# Patient Record
Sex: Female | Born: 1935 | Race: White | Hispanic: No | Marital: Married | Smoking: Never smoker
Health system: Southern US, Community
[De-identification: ages and names within clinical notes are randomized; demographics above are authoritative.]

## PROBLEM LIST (undated history)

## (undated) DIAGNOSIS — I4891 Unspecified atrial fibrillation: Secondary | ICD-10-CM

## (undated) DIAGNOSIS — D721 Eosinophilia, unspecified: Secondary | ICD-10-CM

## (undated) DIAGNOSIS — B029 Zoster without complications: Secondary | ICD-10-CM

## (undated) DIAGNOSIS — S35299A Unspecified injury of branches of celiac and mesenteric artery, initial encounter: Secondary | ICD-10-CM

## (undated) DIAGNOSIS — I5181 Takotsubo syndrome: Secondary | ICD-10-CM

## (undated) DIAGNOSIS — I495 Sick sinus syndrome: Secondary | ICD-10-CM

## (undated) DIAGNOSIS — K219 Gastro-esophageal reflux disease without esophagitis: Secondary | ICD-10-CM

## (undated) DIAGNOSIS — F329 Major depressive disorder, single episode, unspecified: Secondary | ICD-10-CM

## (undated) DIAGNOSIS — I272 Pulmonary hypertension, unspecified: Secondary | ICD-10-CM

## (undated) DIAGNOSIS — J310 Chronic rhinitis: Secondary | ICD-10-CM

## (undated) DIAGNOSIS — E785 Hyperlipidemia, unspecified: Secondary | ICD-10-CM

## (undated) DIAGNOSIS — M1990 Unspecified osteoarthritis, unspecified site: Secondary | ICD-10-CM

## (undated) DIAGNOSIS — K279 Peptic ulcer, site unspecified, unspecified as acute or chronic, without hemorrhage or perforation: Secondary | ICD-10-CM

## (undated) DIAGNOSIS — F32A Depression, unspecified: Secondary | ICD-10-CM

## (undated) HISTORY — DX: Unspecified injury of branches of celiac and mesenteric artery, initial encounter: S35.299A

## (undated) HISTORY — DX: Unspecified atrial fibrillation: I48.91

## (undated) HISTORY — PX: TONSILLECTOMY: SUR1361

## (undated) HISTORY — PX: COSMETIC SURGERY: SHX468

## (undated) HISTORY — DX: Chronic rhinitis: J31.0

## (undated) HISTORY — DX: Major depressive disorder, single episode, unspecified: F32.9

## (undated) HISTORY — PX: CATARACT EXTRACTION: SUR2

## (undated) HISTORY — DX: Takotsubo syndrome: I51.81

## (undated) HISTORY — DX: Peptic ulcer, site unspecified, unspecified as acute or chronic, without hemorrhage or perforation: K27.9

## (undated) HISTORY — DX: Depression, unspecified: F32.A

## (undated) HISTORY — DX: Unspecified osteoarthritis, unspecified site: M19.90

## (undated) HISTORY — PX: CHOLECYSTECTOMY: SHX55

## (undated) SURGERY — Surgical Case
Anesthesia: *Unknown

---

## 1998-03-25 ENCOUNTER — Other Ambulatory Visit: Admission: RE | Admit: 1998-03-25 | Discharge: 1998-03-25 | Payer: Self-pay | Admitting: *Deleted

## 1999-02-16 ENCOUNTER — Other Ambulatory Visit: Admission: RE | Admit: 1999-02-16 | Discharge: 1999-02-16 | Payer: Self-pay | Admitting: *Deleted

## 1999-08-02 ENCOUNTER — Emergency Department (HOSPITAL_COMMUNITY): Admission: EM | Admit: 1999-08-02 | Discharge: 1999-08-02 | Payer: Self-pay | Admitting: Emergency Medicine

## 1999-08-02 ENCOUNTER — Encounter: Payer: Self-pay | Admitting: Emergency Medicine

## 2001-03-02 ENCOUNTER — Encounter: Admission: RE | Admit: 2001-03-02 | Discharge: 2001-03-02 | Payer: Self-pay | Admitting: *Deleted

## 2001-03-02 ENCOUNTER — Encounter: Payer: Self-pay | Admitting: *Deleted

## 2003-02-06 ENCOUNTER — Inpatient Hospital Stay (HOSPITAL_COMMUNITY): Admission: AD | Admit: 2003-02-06 | Discharge: 2003-02-08 | Payer: Self-pay

## 2003-03-04 ENCOUNTER — Ambulatory Visit (HOSPITAL_COMMUNITY): Admission: RE | Admit: 2003-03-04 | Discharge: 2003-03-04 | Payer: Self-pay

## 2003-05-23 ENCOUNTER — Ambulatory Visit (HOSPITAL_COMMUNITY): Admission: RE | Admit: 2003-05-23 | Discharge: 2003-05-23 | Payer: Self-pay | Admitting: Pulmonary Disease

## 2003-05-23 ENCOUNTER — Encounter: Payer: Self-pay | Admitting: Pulmonary Disease

## 2003-05-27 ENCOUNTER — Encounter (INDEPENDENT_AMBULATORY_CARE_PROVIDER_SITE_OTHER): Payer: Self-pay | Admitting: *Deleted

## 2003-05-27 ENCOUNTER — Ambulatory Visit (HOSPITAL_COMMUNITY): Admission: RE | Admit: 2003-05-27 | Discharge: 2003-05-27 | Payer: Self-pay | Admitting: Gastroenterology

## 2004-02-04 ENCOUNTER — Ambulatory Visit: Admission: RE | Admit: 2004-02-04 | Discharge: 2004-02-04 | Payer: Self-pay | Admitting: Pulmonary Disease

## 2004-03-13 ENCOUNTER — Other Ambulatory Visit: Admission: RE | Admit: 2004-03-13 | Discharge: 2004-03-13 | Payer: Self-pay | Admitting: Family Medicine

## 2004-07-21 ENCOUNTER — Ambulatory Visit (HOSPITAL_COMMUNITY): Admission: RE | Admit: 2004-07-21 | Discharge: 2004-07-21 | Payer: Self-pay | Admitting: Gastroenterology

## 2004-07-21 ENCOUNTER — Encounter (INDEPENDENT_AMBULATORY_CARE_PROVIDER_SITE_OTHER): Payer: Self-pay | Admitting: Specialist

## 2004-09-02 ENCOUNTER — Ambulatory Visit: Payer: Self-pay | Admitting: Internal Medicine

## 2004-09-07 ENCOUNTER — Ambulatory Visit: Payer: Self-pay | Admitting: Pulmonary Disease

## 2004-09-23 ENCOUNTER — Ambulatory Visit: Payer: Self-pay | Admitting: Pulmonary Disease

## 2004-11-03 ENCOUNTER — Ambulatory Visit: Payer: Self-pay | Admitting: Family Medicine

## 2004-11-25 ENCOUNTER — Ambulatory Visit: Payer: Self-pay | Admitting: Pulmonary Disease

## 2005-02-19 ENCOUNTER — Ambulatory Visit: Payer: Self-pay | Admitting: Family Medicine

## 2005-03-08 ENCOUNTER — Other Ambulatory Visit: Admission: RE | Admit: 2005-03-08 | Discharge: 2005-03-08 | Payer: Self-pay | Admitting: Family Medicine

## 2005-03-08 ENCOUNTER — Ambulatory Visit: Payer: Self-pay | Admitting: Family Medicine

## 2005-03-18 ENCOUNTER — Ambulatory Visit: Payer: Self-pay | Admitting: Pulmonary Disease

## 2005-03-30 ENCOUNTER — Ambulatory Visit: Payer: Self-pay | Admitting: Family Medicine

## 2005-04-05 ENCOUNTER — Ambulatory Visit: Payer: Self-pay | Admitting: Family Medicine

## 2005-06-30 ENCOUNTER — Ambulatory Visit (HOSPITAL_COMMUNITY): Admission: RE | Admit: 2005-06-30 | Discharge: 2005-06-30 | Payer: Self-pay | Admitting: Gastroenterology

## 2005-06-30 ENCOUNTER — Encounter (INDEPENDENT_AMBULATORY_CARE_PROVIDER_SITE_OTHER): Payer: Self-pay | Admitting: Specialist

## 2005-07-01 ENCOUNTER — Ambulatory Visit: Payer: Self-pay | Admitting: Family Medicine

## 2005-07-12 ENCOUNTER — Ambulatory Visit: Payer: Self-pay | Admitting: Family Medicine

## 2005-07-19 ENCOUNTER — Ambulatory Visit: Payer: Self-pay | Admitting: Family Medicine

## 2005-07-26 ENCOUNTER — Ambulatory Visit: Payer: Self-pay | Admitting: Family Medicine

## 2005-08-05 ENCOUNTER — Ambulatory Visit: Payer: Self-pay | Admitting: Family Medicine

## 2005-08-10 ENCOUNTER — Ambulatory Visit: Payer: Self-pay | Admitting: Critical Care Medicine

## 2005-08-17 ENCOUNTER — Ambulatory Visit: Payer: Self-pay | Admitting: Internal Medicine

## 2005-08-31 ENCOUNTER — Ambulatory Visit: Payer: Self-pay | Admitting: Pulmonary Disease

## 2005-09-09 ENCOUNTER — Ambulatory Visit: Payer: Self-pay | Admitting: Pulmonary Disease

## 2005-09-10 ENCOUNTER — Ambulatory Visit: Payer: Self-pay | Admitting: Internal Medicine

## 2005-09-22 ENCOUNTER — Ambulatory Visit (HOSPITAL_COMMUNITY): Admission: RE | Admit: 2005-09-22 | Discharge: 2005-09-22 | Payer: Self-pay | Admitting: Internal Medicine

## 2005-10-01 ENCOUNTER — Encounter (INDEPENDENT_AMBULATORY_CARE_PROVIDER_SITE_OTHER): Payer: Self-pay | Admitting: *Deleted

## 2005-10-01 ENCOUNTER — Encounter: Payer: Self-pay | Admitting: Internal Medicine

## 2005-10-01 ENCOUNTER — Ambulatory Visit: Payer: Self-pay | Admitting: Internal Medicine

## 2005-10-01 ENCOUNTER — Ambulatory Visit (HOSPITAL_COMMUNITY): Admission: RE | Admit: 2005-10-01 | Discharge: 2005-10-01 | Payer: Self-pay | Admitting: Internal Medicine

## 2005-10-07 ENCOUNTER — Ambulatory Visit: Payer: Self-pay | Admitting: Family Medicine

## 2005-10-14 ENCOUNTER — Ambulatory Visit: Payer: Self-pay | Admitting: Family Medicine

## 2005-10-21 ENCOUNTER — Ambulatory Visit: Payer: Self-pay | Admitting: Pulmonary Disease

## 2005-11-01 ENCOUNTER — Ambulatory Visit: Payer: Self-pay | Admitting: Family Medicine

## 2005-11-16 ENCOUNTER — Ambulatory Visit: Payer: Self-pay | Admitting: Internal Medicine

## 2005-12-15 LAB — CBC WITH DIFFERENTIAL/PLATELET
Basophils Absolute: 0.1 10*3/uL (ref 0.0–0.1)
Eosinophils Absolute: 5.5 10*3/uL — ABNORMAL HIGH (ref 0.0–0.5)
HCT: 37.8 % (ref 34.8–46.6)
HGB: 12.7 g/dL (ref 11.6–15.9)
MCV: 84.8 fL (ref 81.0–101.0)
MONO%: 6.2 % (ref 0.0–13.0)
NEUT#: 3 10*3/uL (ref 1.5–6.5)
RDW: 14.5 % (ref 11.3–14.5)

## 2005-12-29 LAB — CBC WITH DIFFERENTIAL/PLATELET
Basophils Absolute: 0.2 10*3/uL — ABNORMAL HIGH (ref 0.0–0.1)
EOS%: 44.1 % — ABNORMAL HIGH (ref 0.0–7.0)
MONO%: 5.2 % (ref 0.0–13.0)
NEUT#: 3.2 10*3/uL (ref 1.5–6.5)
NEUT%: 31.4 % — ABNORMAL LOW (ref 39.6–76.8)
Platelets: 335 10*3/uL (ref 145–400)
RBC: 4.45 10*6/uL (ref 3.70–5.32)
WBC: 10.2 10*3/uL — ABNORMAL HIGH (ref 3.9–10.0)

## 2005-12-29 LAB — COMPREHENSIVE METABOLIC PANEL
ALT: 27 U/L (ref 0–40)
Alkaline Phosphatase: 85 U/L (ref 39–117)
Sodium: 125 mEq/L — ABNORMAL LOW (ref 135–145)
Total Bilirubin: 0.3 mg/dL (ref 0.3–1.2)
Total Protein: 6.7 g/dL (ref 6.0–8.3)

## 2006-01-10 ENCOUNTER — Ambulatory Visit: Payer: Self-pay | Admitting: Internal Medicine

## 2006-01-12 LAB — LACTATE DEHYDROGENASE: LDH: 166 U/L (ref 94–250)

## 2006-01-12 LAB — CBC WITH DIFFERENTIAL/PLATELET
BASO%: 0.9 % (ref 0.0–2.0)
EOS%: 42.1 % — ABNORMAL HIGH (ref 0.0–7.0)
HCT: 34.5 % — ABNORMAL LOW (ref 34.8–46.6)
LYMPH%: 15.6 % (ref 14.0–48.0)
MCH: 29.3 pg (ref 26.0–34.0)
MCHC: 34.3 g/dL (ref 32.0–36.0)
MONO#: 0.4 10*3/uL (ref 0.1–0.9)
NEUT%: 36.2 % — ABNORMAL LOW (ref 39.6–76.8)
Platelets: 233 10*3/uL (ref 145–400)

## 2006-01-12 LAB — COMPREHENSIVE METABOLIC PANEL
ALT: 30 U/L (ref 0–40)
BUN: 12 mg/dL (ref 6–23)
CO2: 21 mEq/L (ref 19–32)
Creatinine, Ser: 0.6 mg/dL (ref 0.4–1.2)
Total Bilirubin: 0.4 mg/dL (ref 0.3–1.2)

## 2006-01-25 ENCOUNTER — Ambulatory Visit: Payer: Self-pay | Admitting: Pulmonary Disease

## 2006-01-26 LAB — CBC WITH DIFFERENTIAL/PLATELET
Basophils Absolute: 0.1 10*3/uL (ref 0.0–0.1)
Eosinophils Absolute: 3.5 10*3/uL — ABNORMAL HIGH (ref 0.0–0.5)
HCT: 34.2 % — ABNORMAL LOW (ref 34.8–46.6)
HGB: 11.7 g/dL (ref 11.6–15.9)
MONO#: 0.4 10*3/uL (ref 0.1–0.9)
NEUT%: 26.6 % — ABNORMAL LOW (ref 39.6–76.8)
WBC: 7.4 10*3/uL (ref 3.9–10.0)
lymph#: 1.4 10*3/uL (ref 0.9–3.3)

## 2006-02-16 LAB — CBC WITH DIFFERENTIAL/PLATELET
Basophils Absolute: 0.1 10*3/uL (ref 0.0–0.1)
Eosinophils Absolute: 1.9 10*3/uL — ABNORMAL HIGH (ref 0.0–0.5)
HGB: 11.3 g/dL — ABNORMAL LOW (ref 11.6–15.9)
LYMPH%: 16.5 % (ref 14.0–48.0)
MCV: 87.6 fL (ref 81.0–101.0)
MONO%: 5.3 % (ref 0.0–13.0)
NEUT#: 4.2 10*3/uL (ref 1.5–6.5)
Platelets: 254 10*3/uL (ref 145–400)

## 2006-02-16 LAB — COMPREHENSIVE METABOLIC PANEL
Albumin: 4.3 g/dL (ref 3.5–5.2)
Alkaline Phosphatase: 70 U/L (ref 39–117)
BUN: 12 mg/dL (ref 6–23)
CO2: 21 mEq/L (ref 19–32)
Glucose, Bld: 89 mg/dL (ref 70–99)
Potassium: 4.1 mEq/L (ref 3.5–5.3)
Total Bilirubin: 0.4 mg/dL (ref 0.3–1.2)

## 2006-02-16 LAB — LACTATE DEHYDROGENASE: LDH: 160 U/L (ref 94–250)

## 2006-03-02 ENCOUNTER — Ambulatory Visit: Payer: Self-pay | Admitting: Internal Medicine

## 2006-03-02 LAB — CBC WITH DIFFERENTIAL/PLATELET
BASO%: 0.4 % (ref 0.0–2.0)
EOS%: 37.5 % — ABNORMAL HIGH (ref 0.0–7.0)
Eosinophils Absolute: 2.4 10*3/uL — ABNORMAL HIGH (ref 0.0–0.5)
LYMPH%: 18.9 % (ref 14.0–48.0)
MCH: 30.9 pg (ref 26.0–34.0)
MCHC: 34.5 g/dL (ref 32.0–36.0)
MCV: 89.4 fL (ref 81.0–101.0)
MONO%: 4.4 % (ref 0.0–13.0)
NEUT#: 2.5 10*3/uL (ref 1.5–6.5)
Platelets: 235 10*3/uL (ref 145–400)
RBC: 3.7 10*6/uL (ref 3.70–5.32)
RDW: 20.4 % — ABNORMAL HIGH (ref 11.3–14.5)

## 2006-03-02 LAB — COMPREHENSIVE METABOLIC PANEL
ALT: 23 U/L (ref 0–40)
AST: 25 U/L (ref 0–37)
Albumin: 4.5 g/dL (ref 3.5–5.2)
CO2: 22 mEq/L (ref 19–32)
Calcium: 8.7 mg/dL (ref 8.4–10.5)
Chloride: 94 mEq/L — ABNORMAL LOW (ref 96–112)
Potassium: 4.2 mEq/L (ref 3.5–5.3)
Sodium: 128 mEq/L — ABNORMAL LOW (ref 135–145)
Total Protein: 6.7 g/dL (ref 6.0–8.3)

## 2006-03-02 LAB — LACTATE DEHYDROGENASE: LDH: 179 U/L (ref 94–250)

## 2006-03-16 LAB — COMPREHENSIVE METABOLIC PANEL
ALT: 21 U/L (ref 0–40)
AST: 22 U/L (ref 0–37)
Albumin: 4.2 g/dL (ref 3.5–5.2)
Alkaline Phosphatase: 74 U/L (ref 39–117)
BUN: 13 mg/dL (ref 6–23)
Chloride: 97 mEq/L (ref 96–112)
Creatinine, Ser: 0.57 mg/dL (ref 0.40–1.20)
Potassium: 4.2 mEq/L (ref 3.5–5.3)

## 2006-03-16 LAB — CBC WITH DIFFERENTIAL/PLATELET
BASO%: 0.8 % (ref 0.0–2.0)
Basophils Absolute: 0.1 10*3/uL (ref 0.0–0.1)
EOS%: 36.4 % — ABNORMAL HIGH (ref 0.0–7.0)
HGB: 10.9 g/dL — ABNORMAL LOW (ref 11.6–15.9)
MCH: 31.7 pg (ref 26.0–34.0)
MCHC: 34.8 g/dL (ref 32.0–36.0)
MCV: 91.2 fL (ref 81.0–101.0)
MONO%: 4.9 % (ref 0.0–13.0)
RDW: 19.3 % — ABNORMAL HIGH (ref 11.3–14.5)
lymph#: 1.4 10*3/uL (ref 0.9–3.3)

## 2006-03-30 LAB — CBC WITH DIFFERENTIAL/PLATELET
BASO%: 1.5 % (ref 0.0–2.0)
EOS%: 36.1 % — ABNORMAL HIGH (ref 0.0–7.0)
Eosinophils Absolute: 2.3 10*3/uL — ABNORMAL HIGH (ref 0.0–0.5)
MCV: 93.9 fL (ref 81.0–101.0)
MONO%: 5.6 % (ref 0.0–13.0)
NEUT#: 2.3 10*3/uL (ref 1.5–6.5)
RBC: 3.36 10*6/uL — ABNORMAL LOW (ref 3.70–5.32)
RDW: 17.4 % — ABNORMAL HIGH (ref 11.3–14.5)

## 2006-03-30 LAB — COMPREHENSIVE METABOLIC PANEL
BUN: 14 mg/dL (ref 6–23)
CO2: 24 mEq/L (ref 19–32)
Calcium: 8.7 mg/dL (ref 8.4–10.5)
Chloride: 98 mEq/L (ref 96–112)
Creatinine, Ser: 0.77 mg/dL (ref 0.40–1.20)
Glucose, Bld: 95 mg/dL (ref 70–99)

## 2006-03-30 LAB — LACTATE DEHYDROGENASE: LDH: 157 U/L (ref 94–250)

## 2006-04-08 ENCOUNTER — Ambulatory Visit: Payer: Self-pay | Admitting: Family Medicine

## 2006-04-11 ENCOUNTER — Ambulatory Visit: Payer: Self-pay | Admitting: Internal Medicine

## 2006-04-13 LAB — COMPREHENSIVE METABOLIC PANEL
AST: 28 U/L (ref 0–37)
Albumin: 4.5 g/dL (ref 3.5–5.2)
BUN: 11 mg/dL (ref 6–23)
Calcium: 9 mg/dL (ref 8.4–10.5)
Chloride: 95 mEq/L — ABNORMAL LOW (ref 96–112)
Potassium: 4.1 mEq/L (ref 3.5–5.3)
Sodium: 127 mEq/L — ABNORMAL LOW (ref 135–145)
Total Protein: 6.7 g/dL (ref 6.0–8.3)

## 2006-04-13 LAB — CBC WITH DIFFERENTIAL/PLATELET
Basophils Absolute: 0.1 10*3/uL (ref 0.0–0.1)
EOS%: 32.4 % — ABNORMAL HIGH (ref 0.0–7.0)
Eosinophils Absolute: 2.5 10*3/uL — ABNORMAL HIGH (ref 0.0–0.5)
HGB: 11.8 g/dL (ref 11.6–15.9)
MCH: 33.1 pg (ref 26.0–34.0)
NEUT#: 3.1 10*3/uL (ref 1.5–6.5)
RBC: 3.56 10*6/uL — ABNORMAL LOW (ref 3.70–5.32)
RDW: 15.3 % — ABNORMAL HIGH (ref 11.3–14.5)
lymph#: 1.5 10*3/uL (ref 0.9–3.3)

## 2006-04-18 ENCOUNTER — Ambulatory Visit: Payer: Self-pay

## 2006-04-18 ENCOUNTER — Ambulatory Visit: Payer: Self-pay | Admitting: Internal Medicine

## 2006-04-18 ENCOUNTER — Encounter: Payer: Self-pay | Admitting: Cardiology

## 2006-04-29 ENCOUNTER — Inpatient Hospital Stay (HOSPITAL_COMMUNITY): Admission: AD | Admit: 2006-04-29 | Discharge: 2006-05-06 | Payer: Self-pay | Admitting: Cardiology

## 2006-04-29 ENCOUNTER — Ambulatory Visit: Payer: Self-pay | Admitting: Cardiology

## 2006-05-08 ENCOUNTER — Ambulatory Visit: Payer: Self-pay | Admitting: *Deleted

## 2006-05-08 ENCOUNTER — Inpatient Hospital Stay (HOSPITAL_COMMUNITY): Admission: EM | Admit: 2006-05-08 | Discharge: 2006-05-16 | Payer: Self-pay | Admitting: *Deleted

## 2006-05-08 ENCOUNTER — Encounter: Payer: Self-pay | Admitting: Emergency Medicine

## 2006-05-09 ENCOUNTER — Encounter: Payer: Self-pay | Admitting: Cardiovascular Disease

## 2006-05-09 ENCOUNTER — Ambulatory Visit: Payer: Self-pay | Admitting: Internal Medicine

## 2006-05-10 ENCOUNTER — Ambulatory Visit: Payer: Self-pay | Admitting: Internal Medicine

## 2006-05-18 ENCOUNTER — Ambulatory Visit: Payer: Self-pay | Admitting: Cardiology

## 2006-05-24 ENCOUNTER — Inpatient Hospital Stay (HOSPITAL_COMMUNITY): Admission: EM | Admit: 2006-05-24 | Discharge: 2006-05-31 | Payer: Self-pay | Admitting: Emergency Medicine

## 2006-05-24 ENCOUNTER — Ambulatory Visit: Payer: Self-pay | Admitting: Pulmonary Disease

## 2006-05-24 ENCOUNTER — Ambulatory Visit: Payer: Self-pay | Admitting: Internal Medicine

## 2006-05-25 ENCOUNTER — Encounter: Payer: Self-pay | Admitting: Cardiology

## 2006-06-06 ENCOUNTER — Ambulatory Visit: Payer: Self-pay | Admitting: Cardiology

## 2006-06-08 LAB — COMPREHENSIVE METABOLIC PANEL
ALT: 19 U/L (ref 0–40)
AST: 20 U/L (ref 0–37)
Albumin: 4.4 g/dL (ref 3.5–5.2)
Alkaline Phosphatase: 66 U/L (ref 39–117)
BUN: 16 mg/dL (ref 6–23)
CO2: 22 mEq/L (ref 19–32)
Calcium: 9 mg/dL (ref 8.4–10.5)
Chloride: 100 mEq/L (ref 96–112)
Creatinine, Ser: 0.72 mg/dL (ref 0.40–1.20)
Glucose, Bld: 118 mg/dL — ABNORMAL HIGH (ref 70–99)
Potassium: 4 mEq/L (ref 3.5–5.3)
Sodium: 132 mEq/L — ABNORMAL LOW (ref 135–145)
Total Bilirubin: 0.4 mg/dL (ref 0.3–1.2)
Total Protein: 6.7 g/dL (ref 6.0–8.3)

## 2006-06-08 LAB — CBC WITH DIFFERENTIAL/PLATELET
BASO%: 1.8 % (ref 0.0–2.0)
Basophils Absolute: 0.1 10*3/uL (ref 0.0–0.1)
EOS%: 34.9 % — ABNORMAL HIGH (ref 0.0–7.0)
Eosinophils Absolute: 2.5 10*3/uL — ABNORMAL HIGH (ref 0.0–0.5)
HCT: 36.6 % (ref 34.8–46.6)
HGB: 12.6 g/dL (ref 11.6–15.9)
LYMPH%: 20.6 % (ref 14.0–48.0)
MCH: 32.8 pg (ref 26.0–34.0)
MCHC: 34.4 g/dL (ref 32.0–36.0)
MCV: 95.1 fL (ref 81.0–101.0)
MONO#: 0.6 10*3/uL (ref 0.1–0.9)
MONO%: 9 % (ref 0.0–13.0)
NEUT#: 2.4 10*3/uL (ref 1.5–6.5)
NEUT%: 33.7 % — ABNORMAL LOW (ref 39.6–76.8)
Platelets: 316 10*3/uL (ref 145–400)
RBC: 3.84 10*6/uL (ref 3.70–5.32)
RDW: 13.4 % (ref 11.3–14.5)
WBC: 7.1 10*3/uL (ref 3.9–10.0)
lymph#: 1.5 10*3/uL (ref 0.9–3.3)

## 2006-06-08 LAB — LACTATE DEHYDROGENASE: LDH: 146 U/L (ref 94–250)

## 2006-06-10 ENCOUNTER — Ambulatory Visit: Payer: Self-pay | Admitting: Family Medicine

## 2006-06-13 ENCOUNTER — Ambulatory Visit: Payer: Self-pay | Admitting: Cardiovascular Disease

## 2006-06-27 ENCOUNTER — Ambulatory Visit: Payer: Self-pay | Admitting: Cardiology

## 2006-07-06 ENCOUNTER — Ambulatory Visit: Payer: Self-pay | Admitting: Internal Medicine

## 2006-07-07 ENCOUNTER — Ambulatory Visit: Payer: Self-pay | Admitting: Internal Medicine

## 2006-07-11 LAB — COMPREHENSIVE METABOLIC PANEL
ALT: 16 U/L (ref 0–35)
AST: 19 U/L (ref 0–37)
Albumin: 4.3 g/dL (ref 3.5–5.2)
CO2: 21 mEq/L (ref 19–32)
Calcium: 9.4 mg/dL (ref 8.4–10.5)
Chloride: 100 mEq/L (ref 96–112)
Creatinine, Ser: 0.84 mg/dL (ref 0.40–1.20)
Potassium: 4.9 mEq/L (ref 3.5–5.3)
Sodium: 130 mEq/L — ABNORMAL LOW (ref 135–145)
Total Protein: 6.7 g/dL (ref 6.0–8.3)

## 2006-07-11 LAB — CBC WITH DIFFERENTIAL/PLATELET
BASO%: 1.2 % (ref 0.0–2.0)
EOS%: 25.5 % — ABNORMAL HIGH (ref 0.0–7.0)
HCT: 31.5 % — ABNORMAL LOW (ref 34.8–46.6)
MCHC: 34.5 g/dL (ref 32.0–36.0)
MONO#: 0.8 10*3/uL (ref 0.1–0.9)
NEUT%: 40.7 % (ref 39.6–76.8)
RDW: 13.3 % (ref 11.3–14.5)
WBC: 7.7 10*3/uL (ref 3.9–10.0)
lymph#: 1.8 10*3/uL (ref 0.9–3.3)

## 2006-07-11 LAB — LACTATE DEHYDROGENASE: LDH: 138 U/L (ref 94–250)

## 2006-07-18 ENCOUNTER — Ambulatory Visit: Payer: Self-pay | Admitting: Cardiology

## 2006-07-25 ENCOUNTER — Encounter: Payer: Self-pay | Admitting: Cardiology

## 2006-07-25 ENCOUNTER — Ambulatory Visit: Payer: Self-pay

## 2006-07-26 ENCOUNTER — Ambulatory Visit: Payer: Self-pay | Admitting: Pulmonary Disease

## 2006-08-01 ENCOUNTER — Ambulatory Visit: Payer: Self-pay | Admitting: Cardiology

## 2006-08-08 LAB — CBC WITH DIFFERENTIAL/PLATELET
BASO%: 1.2 % (ref 0.0–2.0)
Eosinophils Absolute: 1.5 10*3/uL — ABNORMAL HIGH (ref 0.0–0.5)
MCHC: 34 g/dL (ref 32.0–36.0)
MCV: 91.8 fL (ref 81.0–101.0)
MONO#: 0.7 10*3/uL (ref 0.1–0.9)
MONO%: 8.4 % (ref 0.0–13.0)
NEUT#: 4.2 10*3/uL (ref 1.5–6.5)
RBC: 3.65 10*6/uL — ABNORMAL LOW (ref 3.70–5.32)
RDW: 13 % (ref 11.3–14.5)
WBC: 8.2 10*3/uL (ref 3.9–10.0)

## 2006-08-08 LAB — LACTATE DEHYDROGENASE: LDH: 166 U/L (ref 94–250)

## 2006-08-17 ENCOUNTER — Ambulatory Visit: Payer: Self-pay | Admitting: Internal Medicine

## 2006-08-17 ENCOUNTER — Ambulatory Visit: Payer: Self-pay | Admitting: Cardiology

## 2006-08-25 ENCOUNTER — Ambulatory Visit: Payer: Self-pay | Admitting: Cardiology

## 2006-08-31 ENCOUNTER — Ambulatory Visit: Payer: Self-pay | Admitting: Internal Medicine

## 2006-09-05 LAB — CBC WITH DIFFERENTIAL/PLATELET
Eosinophils Absolute: 0.9 10*3/uL — ABNORMAL HIGH (ref 0.0–0.5)
HCT: 31.2 % — ABNORMAL LOW (ref 34.8–46.6)
LYMPH%: 17.5 % (ref 14.0–48.0)
MONO#: 0.9 10*3/uL (ref 0.1–0.9)
NEUT#: 4.3 10*3/uL (ref 1.5–6.5)
NEUT%: 56.6 % (ref 39.6–76.8)
Platelets: 279 10*3/uL (ref 145–400)
WBC: 7.5 10*3/uL (ref 3.9–10.0)

## 2006-09-09 ENCOUNTER — Ambulatory Visit: Payer: Self-pay | Admitting: Family Medicine

## 2006-09-22 ENCOUNTER — Ambulatory Visit: Payer: Self-pay | Admitting: Internal Medicine

## 2006-09-22 ENCOUNTER — Ambulatory Visit: Payer: Self-pay | Admitting: Cardiology

## 2006-09-29 HISTORY — PX: PACEMAKER INSERTION: SHX728

## 2006-10-05 LAB — CBC WITH DIFFERENTIAL/PLATELET
Basophils Absolute: 0 10*3/uL (ref 0.0–0.1)
EOS%: 10.9 % — ABNORMAL HIGH (ref 0.0–7.0)
HCT: 31.4 % — ABNORMAL LOW (ref 34.8–46.6)
HGB: 10.8 g/dL — ABNORMAL LOW (ref 11.6–15.9)
MCH: 29.9 pg (ref 26.0–34.0)
MCV: 86.8 fL (ref 81.0–101.0)
MONO%: 10.5 % (ref 0.0–13.0)
NEUT%: 63.2 % (ref 39.6–76.8)

## 2006-10-13 ENCOUNTER — Ambulatory Visit: Payer: Self-pay

## 2006-10-13 LAB — CONVERTED CEMR LAB
Basophils Absolute: 0 10*3/uL (ref 0.0–0.1)
Creatinine, Ser: 0.7 mg/dL (ref 0.4–1.2)
Eosinophils Absolute: 0.9 10*3/uL — ABNORMAL HIGH (ref 0.0–0.6)
GFR calc non Af Amer: 88 mL/min
HCT: 32.3 % — ABNORMAL LOW (ref 36.0–46.0)
Hemoglobin: 11.2 g/dL — ABNORMAL LOW (ref 12.0–15.0)
Lymphocytes Relative: 19.9 % (ref 12.0–46.0)
MCHC: 34.6 g/dL (ref 30.0–36.0)
MCV: 86.7 fL (ref 78.0–100.0)
Monocytes Absolute: 0.5 10*3/uL (ref 0.2–0.7)
Neutrophils Relative %: 58.8 % (ref 43.0–77.0)
Potassium: 4.3 meq/L (ref 3.5–5.1)
Sodium: 131 meq/L — ABNORMAL LOW (ref 135–145)
aPTT: 41.3 s — ABNORMAL HIGH (ref 26.5–36.5)

## 2006-10-19 ENCOUNTER — Ambulatory Visit: Payer: Self-pay | Admitting: Internal Medicine

## 2006-10-19 ENCOUNTER — Ambulatory Visit (HOSPITAL_COMMUNITY): Admission: RE | Admit: 2006-10-19 | Discharge: 2006-10-20 | Payer: Self-pay | Admitting: Internal Medicine

## 2006-10-27 ENCOUNTER — Ambulatory Visit: Payer: Self-pay | Admitting: Cardiology

## 2006-10-31 DIAGNOSIS — M1990 Unspecified osteoarthritis, unspecified site: Secondary | ICD-10-CM | POA: Insufficient documentation

## 2006-10-31 DIAGNOSIS — M810 Age-related osteoporosis without current pathological fracture: Secondary | ICD-10-CM | POA: Insufficient documentation

## 2006-10-31 DIAGNOSIS — F329 Major depressive disorder, single episode, unspecified: Secondary | ICD-10-CM

## 2006-10-31 DIAGNOSIS — K279 Peptic ulcer, site unspecified, unspecified as acute or chronic, without hemorrhage or perforation: Secondary | ICD-10-CM | POA: Insufficient documentation

## 2006-10-31 DIAGNOSIS — Z8679 Personal history of other diseases of the circulatory system: Secondary | ICD-10-CM

## 2006-10-31 DIAGNOSIS — J45909 Unspecified asthma, uncomplicated: Secondary | ICD-10-CM

## 2006-11-14 ENCOUNTER — Ambulatory Visit: Payer: Self-pay | Admitting: Internal Medicine

## 2006-11-14 ENCOUNTER — Ambulatory Visit: Payer: Self-pay

## 2006-11-16 ENCOUNTER — Ambulatory Visit: Payer: Self-pay | Admitting: Pulmonary Disease

## 2006-11-21 ENCOUNTER — Ambulatory Visit: Payer: Self-pay | Admitting: Cardiovascular Disease

## 2006-11-21 ENCOUNTER — Ambulatory Visit: Payer: Self-pay | Admitting: Internal Medicine

## 2006-12-05 ENCOUNTER — Ambulatory Visit: Payer: Self-pay | Admitting: Family Medicine

## 2006-12-14 ENCOUNTER — Ambulatory Visit (HOSPITAL_BASED_OUTPATIENT_CLINIC_OR_DEPARTMENT_OTHER): Admission: RE | Admit: 2006-12-14 | Discharge: 2006-12-14 | Payer: Self-pay | Admitting: Pulmonary Disease

## 2006-12-19 ENCOUNTER — Ambulatory Visit: Payer: Self-pay | Admitting: Cardiology

## 2006-12-20 ENCOUNTER — Ambulatory Visit: Payer: Self-pay | Admitting: Pulmonary Disease

## 2007-01-02 ENCOUNTER — Ambulatory Visit: Payer: Self-pay | Admitting: Internal Medicine

## 2007-01-04 ENCOUNTER — Ambulatory Visit: Payer: Self-pay | Admitting: Pulmonary Disease

## 2007-01-04 LAB — CBC WITH DIFFERENTIAL/PLATELET
BASO%: 0.8 % (ref 0.0–2.0)
MCHC: 35.5 g/dL (ref 32.0–36.0)
MONO#: 0.5 10*3/uL (ref 0.1–0.9)
RBC: 4.18 10*6/uL (ref 3.70–5.32)
RDW: 16.3 % — ABNORMAL HIGH (ref 11.3–14.5)
WBC: 6.5 10*3/uL (ref 3.9–10.0)
lymph#: 1.5 10*3/uL (ref 0.9–3.3)

## 2007-01-04 LAB — LACTATE DEHYDROGENASE: LDH: 184 U/L (ref 94–250)

## 2007-01-17 ENCOUNTER — Ambulatory Visit: Payer: Self-pay | Admitting: Internal Medicine

## 2007-01-17 ENCOUNTER — Ambulatory Visit: Payer: Self-pay | Admitting: Cardiology

## 2007-01-30 ENCOUNTER — Ambulatory Visit: Payer: Self-pay | Admitting: Internal Medicine

## 2007-03-08 ENCOUNTER — Encounter: Payer: Self-pay | Admitting: Family Medicine

## 2007-03-10 ENCOUNTER — Encounter: Payer: Self-pay | Admitting: Family Medicine

## 2007-03-12 ENCOUNTER — Emergency Department (HOSPITAL_COMMUNITY): Admission: EM | Admit: 2007-03-12 | Discharge: 2007-03-12 | Payer: Self-pay | Admitting: Emergency Medicine

## 2007-03-22 ENCOUNTER — Ambulatory Visit: Payer: Self-pay | Admitting: Family Medicine

## 2007-03-22 ENCOUNTER — Telehealth: Payer: Self-pay | Admitting: Family Medicine

## 2007-03-23 LAB — CONVERTED CEMR LAB
Basophils Absolute: 0.1 10*3/uL (ref 0.0–0.1)
CRP, High Sensitivity: 2 (ref 0.00–5.00)
Eosinophils Absolute: 0.6 10*3/uL (ref 0.0–0.6)
Hemoglobin: 13.5 g/dL (ref 12.0–15.0)
MCHC: 35.7 g/dL (ref 30.0–36.0)
Monocytes Absolute: 0.5 10*3/uL (ref 0.2–0.7)
Monocytes Relative: 6.7 % (ref 3.0–11.0)
RDW: 12.8 % (ref 11.5–14.6)
Vitamin B-12: 453 pg/mL (ref 211–911)

## 2007-03-28 ENCOUNTER — Encounter: Payer: Self-pay | Admitting: Family Medicine

## 2007-03-28 DIAGNOSIS — R799 Abnormal finding of blood chemistry, unspecified: Secondary | ICD-10-CM | POA: Insufficient documentation

## 2007-04-03 ENCOUNTER — Ambulatory Visit: Payer: Self-pay | Admitting: Internal Medicine

## 2007-04-05 LAB — CBC WITH DIFFERENTIAL/PLATELET
Basophils Absolute: 0 10*3/uL (ref 0.0–0.1)
EOS%: 5.3 % (ref 0.0–7.0)
Eosinophils Absolute: 0.4 10*3/uL (ref 0.0–0.5)
HGB: 13.2 g/dL (ref 11.6–15.9)
MCH: 31.8 pg (ref 26.0–34.0)
NEUT#: 5.6 10*3/uL (ref 1.5–6.5)
RDW: 13.4 % (ref 11.3–14.5)
lymph#: 1.3 10*3/uL (ref 0.9–3.3)

## 2007-04-27 ENCOUNTER — Encounter: Payer: Self-pay | Admitting: Family Medicine

## 2007-05-02 ENCOUNTER — Ambulatory Visit: Payer: Self-pay | Admitting: Emergency Medicine

## 2007-05-15 ENCOUNTER — Encounter: Payer: Self-pay | Admitting: Family Medicine

## 2007-05-22 ENCOUNTER — Encounter: Payer: Self-pay | Admitting: Family Medicine

## 2007-05-23 ENCOUNTER — Other Ambulatory Visit: Admission: RE | Admit: 2007-05-23 | Discharge: 2007-05-23 | Payer: Self-pay | Admitting: Family Medicine

## 2007-05-23 ENCOUNTER — Encounter: Payer: Self-pay | Admitting: Family Medicine

## 2007-05-23 ENCOUNTER — Ambulatory Visit: Payer: Self-pay | Admitting: Family Medicine

## 2007-05-23 DIAGNOSIS — I5181 Takotsubo syndrome: Secondary | ICD-10-CM

## 2007-06-05 ENCOUNTER — Ambulatory Visit: Payer: Self-pay | Admitting: Family Medicine

## 2007-06-05 ENCOUNTER — Encounter (INDEPENDENT_AMBULATORY_CARE_PROVIDER_SITE_OTHER): Payer: Self-pay | Admitting: *Deleted

## 2007-06-06 ENCOUNTER — Encounter (INDEPENDENT_AMBULATORY_CARE_PROVIDER_SITE_OTHER): Payer: Self-pay | Admitting: *Deleted

## 2007-06-09 ENCOUNTER — Telehealth: Payer: Self-pay | Admitting: Family Medicine

## 2007-06-27 ENCOUNTER — Ambulatory Visit: Payer: Self-pay | Admitting: Family Medicine

## 2007-07-06 ENCOUNTER — Encounter: Payer: Self-pay | Admitting: Emergency Medicine

## 2007-07-06 DIAGNOSIS — R0602 Shortness of breath: Secondary | ICD-10-CM

## 2007-07-06 DIAGNOSIS — J479 Bronchiectasis, uncomplicated: Secondary | ICD-10-CM | POA: Insufficient documentation

## 2007-07-06 DIAGNOSIS — I498 Other specified cardiac arrhythmias: Secondary | ICD-10-CM

## 2007-07-06 DIAGNOSIS — D721 Eosinophilia: Secondary | ICD-10-CM

## 2007-07-06 DIAGNOSIS — I428 Other cardiomyopathies: Secondary | ICD-10-CM

## 2007-07-06 DIAGNOSIS — K219 Gastro-esophageal reflux disease without esophagitis: Secondary | ICD-10-CM | POA: Insufficient documentation

## 2007-07-26 ENCOUNTER — Encounter: Admission: RE | Admit: 2007-07-26 | Discharge: 2007-07-26 | Payer: Self-pay | Admitting: Family Medicine

## 2007-07-28 ENCOUNTER — Encounter (INDEPENDENT_AMBULATORY_CARE_PROVIDER_SITE_OTHER): Payer: Self-pay | Admitting: *Deleted

## 2007-08-03 ENCOUNTER — Ambulatory Visit: Payer: Self-pay | Admitting: Emergency Medicine

## 2007-08-23 ENCOUNTER — Encounter: Payer: Self-pay | Admitting: Family Medicine

## 2007-09-07 ENCOUNTER — Encounter: Payer: Self-pay | Admitting: Family Medicine

## 2007-09-18 ENCOUNTER — Ambulatory Visit: Payer: Self-pay | Admitting: Emergency Medicine

## 2007-10-03 ENCOUNTER — Ambulatory Visit: Payer: Self-pay | Admitting: Internal Medicine

## 2007-12-06 ENCOUNTER — Ambulatory Visit: Payer: Self-pay | Admitting: Family Medicine

## 2007-12-06 DIAGNOSIS — R3919 Other difficulties with micturition: Secondary | ICD-10-CM

## 2007-12-06 DIAGNOSIS — K5900 Constipation, unspecified: Secondary | ICD-10-CM | POA: Insufficient documentation

## 2007-12-06 DIAGNOSIS — L738 Other specified follicular disorders: Secondary | ICD-10-CM | POA: Insufficient documentation

## 2007-12-06 LAB — CONVERTED CEMR LAB
ALT: 25 units/L (ref 0–35)
Albumin: 3.9 g/dL (ref 3.5–5.2)
HDL: 47.9 mg/dL (ref >39.0)
Total Bilirubin: 0.6 mg/dL (ref 0.3–1.2)
Total CHOL/HDL Ratio: 2.6
Triglycerides: 51 mg/dL (ref 0–149)
VLDL: 10 mg/dL (ref 0–40)

## 2007-12-08 ENCOUNTER — Encounter: Payer: Self-pay | Admitting: Family Medicine

## 2007-12-12 ENCOUNTER — Telehealth (INDEPENDENT_AMBULATORY_CARE_PROVIDER_SITE_OTHER): Payer: Self-pay | Admitting: *Deleted

## 2008-01-03 ENCOUNTER — Ambulatory Visit: Payer: Self-pay | Admitting: Internal Medicine

## 2008-02-22 ENCOUNTER — Ambulatory Visit: Payer: Self-pay | Admitting: Internal Medicine

## 2008-04-03 ENCOUNTER — Ambulatory Visit: Payer: Self-pay | Admitting: Internal Medicine

## 2008-04-15 ENCOUNTER — Ambulatory Visit: Payer: Self-pay | Admitting: Family Medicine

## 2008-04-15 DIAGNOSIS — B029 Zoster without complications: Secondary | ICD-10-CM | POA: Insufficient documentation

## 2008-04-26 ENCOUNTER — Ambulatory Visit: Payer: Self-pay | Admitting: Family Medicine

## 2008-04-26 ENCOUNTER — Telehealth (INDEPENDENT_AMBULATORY_CARE_PROVIDER_SITE_OTHER): Payer: Self-pay | Admitting: *Deleted

## 2008-04-26 DIAGNOSIS — R05 Cough: Secondary | ICD-10-CM

## 2008-04-26 DIAGNOSIS — Z87898 Personal history of other specified conditions: Secondary | ICD-10-CM

## 2008-04-26 DIAGNOSIS — J019 Acute sinusitis, unspecified: Secondary | ICD-10-CM

## 2008-04-26 LAB — CONVERTED CEMR LAB: Inflenza A Ag: NEGATIVE

## 2008-05-03 ENCOUNTER — Ambulatory Visit: Payer: Self-pay | Admitting: Family Medicine

## 2008-05-06 ENCOUNTER — Telehealth: Payer: Self-pay | Admitting: Family Medicine

## 2008-05-15 ENCOUNTER — Encounter: Payer: Self-pay | Admitting: Family Medicine

## 2008-05-29 ENCOUNTER — Ambulatory Visit: Payer: Self-pay | Admitting: Family Medicine

## 2008-06-07 ENCOUNTER — Telehealth (INDEPENDENT_AMBULATORY_CARE_PROVIDER_SITE_OTHER): Payer: Self-pay | Admitting: *Deleted

## 2008-06-07 ENCOUNTER — Ambulatory Visit: Payer: Self-pay | Admitting: Family Medicine

## 2008-06-07 DIAGNOSIS — N390 Urinary tract infection, site not specified: Secondary | ICD-10-CM | POA: Insufficient documentation

## 2008-06-07 LAB — CONVERTED CEMR LAB
Glucose, Urine, Semiquant: NEGATIVE
Nitrite: NEGATIVE
Specific Gravity, Urine: <1.005
pH: 6

## 2008-06-08 ENCOUNTER — Encounter: Payer: Self-pay | Admitting: Family Medicine

## 2008-06-11 ENCOUNTER — Telehealth (INDEPENDENT_AMBULATORY_CARE_PROVIDER_SITE_OTHER): Payer: Self-pay | Admitting: *Deleted

## 2008-06-19 ENCOUNTER — Encounter: Payer: Self-pay | Admitting: Family Medicine

## 2008-06-20 ENCOUNTER — Ambulatory Visit: Payer: Self-pay | Admitting: Family Medicine

## 2008-06-21 ENCOUNTER — Ambulatory Visit: Payer: Self-pay | Admitting: Family Medicine

## 2008-06-26 ENCOUNTER — Ambulatory Visit: Payer: Self-pay | Admitting: Emergency Medicine

## 2008-06-28 ENCOUNTER — Encounter (INDEPENDENT_AMBULATORY_CARE_PROVIDER_SITE_OTHER): Payer: Self-pay | Admitting: *Deleted

## 2008-06-28 ENCOUNTER — Ambulatory Visit: Payer: Self-pay | Admitting: Family Medicine

## 2008-06-28 LAB — CONVERTED CEMR LAB
Albumin: 4 g/dL (ref 3.5–5.2)
Alkaline Phosphatase: 81 units/L (ref 39–117)
BUN: 17 mg/dL (ref 6–23)
Basophils Relative: 1.1 % (ref 0.0–3.0)
Creatinine, Ser: 0.6 mg/dL (ref 0.4–1.2)
Eosinophils Absolute: 0.2 10*3/uL (ref 0.0–0.7)
Eosinophils Relative: 3.4 % (ref 0.0–5.0)
GFR calc Af Amer: 127 mL/min
GFR calc non Af Amer: 105 mL/min
Glucose, Bld: 96 mg/dL (ref 70–99)
HCT: 35.2 % — ABNORMAL LOW (ref 36.0–46.0)
HDL: 49.9 mg/dL (ref >39.0)
Hemoglobin: 12.4 g/dL (ref 12.0–15.0)
MCV: 92.5 fL (ref 78.0–100.0)
Monocytes Absolute: 0.8 10*3/uL (ref 0.1–1.0)
Monocytes Relative: 10.4 % (ref 3.0–12.0)
Neutro Abs: 4.6 10*3/uL (ref 1.4–7.7)
OCCULT 2: NEGATIVE
OCCULT 3: NEGATIVE
Platelets: 277 10*3/uL (ref 150–400)
Potassium: 4.3 meq/L (ref 3.5–5.1)
RBC: 3.81 M/uL — ABNORMAL LOW (ref 3.87–5.11)
TSH: 2.35 microintl units/mL (ref 0.35–5.50)
Total CHOL/HDL Ratio: 2.5
Total Protein: 7.3 g/dL (ref 6.0–8.3)
WBC: 7.3 10*3/uL (ref 4.5–10.5)

## 2008-07-03 ENCOUNTER — Ambulatory Visit: Payer: Self-pay | Admitting: Internal Medicine

## 2008-07-31 ENCOUNTER — Encounter: Admission: RE | Admit: 2008-07-31 | Discharge: 2008-07-31 | Payer: Self-pay | Admitting: Family Medicine

## 2008-09-05 ENCOUNTER — Ambulatory Visit: Payer: Self-pay | Admitting: Family Medicine

## 2008-09-23 ENCOUNTER — Encounter (INDEPENDENT_AMBULATORY_CARE_PROVIDER_SITE_OTHER): Payer: Self-pay | Admitting: *Deleted

## 2008-10-10 ENCOUNTER — Ambulatory Visit: Payer: Self-pay | Admitting: Internal Medicine

## 2008-12-23 ENCOUNTER — Ambulatory Visit: Payer: Self-pay | Admitting: Emergency Medicine

## 2009-01-03 ENCOUNTER — Encounter: Payer: Self-pay | Admitting: Internal Medicine

## 2009-01-08 ENCOUNTER — Telehealth (INDEPENDENT_AMBULATORY_CARE_PROVIDER_SITE_OTHER): Payer: Self-pay | Admitting: *Deleted

## 2009-01-10 ENCOUNTER — Ambulatory Visit: Payer: Self-pay | Admitting: Internal Medicine

## 2009-01-17 ENCOUNTER — Encounter (INDEPENDENT_AMBULATORY_CARE_PROVIDER_SITE_OTHER): Payer: Self-pay | Admitting: *Deleted

## 2009-04-09 ENCOUNTER — Encounter: Payer: Self-pay | Admitting: Internal Medicine

## 2009-04-09 ENCOUNTER — Ambulatory Visit: Payer: Self-pay | Admitting: Internal Medicine

## 2009-04-14 ENCOUNTER — Encounter: Payer: Self-pay | Admitting: Internal Medicine

## 2009-05-22 ENCOUNTER — Ambulatory Visit: Payer: Self-pay | Admitting: Internal Medicine

## 2009-06-18 ENCOUNTER — Ambulatory Visit: Payer: Self-pay | Admitting: Emergency Medicine

## 2009-06-23 ENCOUNTER — Ambulatory Visit: Payer: Self-pay | Admitting: Family Medicine

## 2009-06-30 ENCOUNTER — Encounter: Payer: Self-pay | Admitting: Family Medicine

## 2009-07-01 ENCOUNTER — Ambulatory Visit: Payer: Self-pay | Admitting: Family Medicine

## 2009-07-01 LAB — CONVERTED CEMR LAB: OCCULT 2: NEGATIVE

## 2009-07-15 ENCOUNTER — Encounter: Payer: Self-pay | Admitting: Internal Medicine

## 2009-07-21 ENCOUNTER — Ambulatory Visit: Payer: Self-pay | Admitting: Internal Medicine

## 2009-07-24 ENCOUNTER — Ambulatory Visit: Payer: Self-pay | Admitting: Family Medicine

## 2009-07-30 ENCOUNTER — Encounter: Payer: Self-pay | Admitting: Internal Medicine

## 2009-08-04 ENCOUNTER — Encounter: Admission: RE | Admit: 2009-08-04 | Discharge: 2009-08-04 | Payer: Self-pay | Admitting: Family Medicine

## 2009-08-04 ENCOUNTER — Encounter: Payer: Self-pay | Admitting: Family Medicine

## 2009-08-13 ENCOUNTER — Telehealth (INDEPENDENT_AMBULATORY_CARE_PROVIDER_SITE_OTHER): Payer: Self-pay | Admitting: *Deleted

## 2009-09-04 ENCOUNTER — Encounter: Payer: Self-pay | Admitting: Family Medicine

## 2009-09-08 ENCOUNTER — Telehealth (INDEPENDENT_AMBULATORY_CARE_PROVIDER_SITE_OTHER): Payer: Self-pay | Admitting: *Deleted

## 2009-09-24 ENCOUNTER — Telehealth (INDEPENDENT_AMBULATORY_CARE_PROVIDER_SITE_OTHER): Payer: Self-pay | Admitting: *Deleted

## 2009-10-21 ENCOUNTER — Ambulatory Visit: Payer: Self-pay | Admitting: Internal Medicine

## 2009-10-21 DIAGNOSIS — R635 Abnormal weight gain: Secondary | ICD-10-CM | POA: Insufficient documentation

## 2009-10-21 DIAGNOSIS — I495 Sick sinus syndrome: Secondary | ICD-10-CM | POA: Insufficient documentation

## 2009-10-21 DIAGNOSIS — Z950 Presence of cardiac pacemaker: Secondary | ICD-10-CM | POA: Insufficient documentation

## 2009-10-21 DIAGNOSIS — R0609 Other forms of dyspnea: Secondary | ICD-10-CM

## 2009-10-23 LAB — CONVERTED CEMR LAB: TSH: 1.91 microintl units/mL (ref 0.35–5.50)

## 2009-11-10 ENCOUNTER — Ambulatory Visit: Payer: Self-pay | Admitting: Internal Medicine

## 2009-11-10 ENCOUNTER — Ambulatory Visit (HOSPITAL_COMMUNITY): Admission: RE | Admit: 2009-11-10 | Discharge: 2009-11-10 | Payer: Self-pay | Admitting: Internal Medicine

## 2009-11-10 ENCOUNTER — Ambulatory Visit: Payer: Self-pay

## 2009-11-10 ENCOUNTER — Encounter: Payer: Self-pay | Admitting: Internal Medicine

## 2009-11-13 ENCOUNTER — Ambulatory Visit: Payer: Self-pay | Admitting: Emergency Medicine

## 2009-11-14 ENCOUNTER — Ambulatory Visit: Payer: Self-pay | Admitting: Internal Medicine

## 2009-11-14 LAB — CONVERTED CEMR LAB
Basophils Relative: 0.2 % (ref 0.0–3.0)
Eosinophils Relative: 3.1 % (ref 0.0–5.0)
Lymphocytes Relative: 14.8 % (ref 12.0–46.0)
MCV: 89.3 fL (ref 78.0–100.0)
Monocytes Relative: 9.7 % (ref 3.0–12.0)
Neutrophils Relative %: 72.2 % (ref 43.0–77.0)
RBC: 4.12 M/uL (ref 3.87–5.11)
WBC: 8.2 10*3/uL (ref 4.5–10.5)

## 2009-11-20 ENCOUNTER — Telehealth: Payer: Self-pay | Admitting: Internal Medicine

## 2009-11-20 ENCOUNTER — Ambulatory Visit: Payer: Self-pay | Admitting: Family Medicine

## 2009-11-20 DIAGNOSIS — S139XXA Sprain of joints and ligaments of unspecified parts of neck, initial encounter: Secondary | ICD-10-CM | POA: Insufficient documentation

## 2009-12-08 ENCOUNTER — Ambulatory Visit: Payer: Self-pay | Admitting: Cardiology

## 2009-12-08 ENCOUNTER — Ambulatory Visit (HOSPITAL_COMMUNITY): Admission: RE | Admit: 2009-12-08 | Discharge: 2009-12-08 | Payer: Self-pay | Admitting: Internal Medicine

## 2009-12-08 ENCOUNTER — Ambulatory Visit: Payer: Self-pay

## 2009-12-08 ENCOUNTER — Encounter: Payer: Self-pay | Admitting: Internal Medicine

## 2010-01-06 ENCOUNTER — Telehealth (INDEPENDENT_AMBULATORY_CARE_PROVIDER_SITE_OTHER): Payer: Self-pay | Admitting: *Deleted

## 2010-01-14 ENCOUNTER — Ambulatory Visit: Payer: Self-pay | Admitting: Family Medicine

## 2010-01-16 ENCOUNTER — Telehealth: Payer: Self-pay | Admitting: Internal Medicine

## 2010-01-21 ENCOUNTER — Ambulatory Visit: Payer: Self-pay | Admitting: Internal Medicine

## 2010-01-21 LAB — CONVERTED CEMR LAB
ALT: 41 units/L — ABNORMAL HIGH (ref 0–35)
Albumin: 4.2 g/dL (ref 3.5–5.2)
Cholesterol: 117 mg/dL (ref 0–200)
HDL: 41.5 mg/dL (ref >39.00)
LDL Cholesterol: 57 mg/dL (ref 0–99)
Total Protein: 7.2 g/dL (ref 6.0–8.3)
Triglycerides: 93 mg/dL (ref 0.0–149.0)
VLDL: 18.6 mg/dL (ref 0.0–40.0)

## 2010-01-23 DIAGNOSIS — J310 Chronic rhinitis: Secondary | ICD-10-CM

## 2010-01-28 ENCOUNTER — Ambulatory Visit: Payer: Self-pay | Admitting: Emergency Medicine

## 2010-01-30 ENCOUNTER — Encounter: Payer: Self-pay | Admitting: Family Medicine

## 2010-02-19 ENCOUNTER — Encounter: Payer: Self-pay | Admitting: Internal Medicine

## 2010-04-10 ENCOUNTER — Telehealth: Payer: Self-pay | Admitting: Family Medicine

## 2010-04-23 ENCOUNTER — Ambulatory Visit: Payer: Self-pay | Admitting: Internal Medicine

## 2010-04-23 ENCOUNTER — Encounter: Payer: Self-pay | Admitting: Internal Medicine

## 2010-05-05 ENCOUNTER — Ambulatory Visit: Payer: Self-pay | Admitting: Family Medicine

## 2010-05-11 ENCOUNTER — Encounter: Payer: Self-pay | Admitting: Internal Medicine

## 2010-05-29 ENCOUNTER — Telehealth (INDEPENDENT_AMBULATORY_CARE_PROVIDER_SITE_OTHER): Payer: Self-pay | Admitting: *Deleted

## 2010-06-01 ENCOUNTER — Ambulatory Visit: Payer: Self-pay | Admitting: Internal Medicine

## 2010-06-09 ENCOUNTER — Telehealth: Payer: Self-pay | Admitting: Internal Medicine

## 2010-07-08 ENCOUNTER — Ambulatory Visit: Payer: Self-pay | Admitting: Internal Medicine

## 2010-07-23 ENCOUNTER — Ambulatory Visit: Payer: Self-pay | Admitting: Internal Medicine

## 2010-07-29 ENCOUNTER — Ambulatory Visit: Payer: Self-pay | Admitting: Emergency Medicine

## 2010-08-05 ENCOUNTER — Encounter
Admission: RE | Admit: 2010-08-05 | Discharge: 2010-08-05 | Payer: Self-pay | Source: Home / Self Care | Attending: Family Medicine | Admitting: Family Medicine

## 2010-09-02 ENCOUNTER — Encounter (INDEPENDENT_AMBULATORY_CARE_PROVIDER_SITE_OTHER): Payer: Self-pay | Admitting: *Deleted

## 2010-09-13 ENCOUNTER — Encounter: Payer: Self-pay | Admitting: Family Medicine

## 2010-09-20 LAB — CONVERTED CEMR LAB
ALT: 27 units/L (ref 0–35)
ALT: 34 units/L (ref 0–35)
ALT: 49 units/L — ABNORMAL HIGH (ref 0–35)
AST: 25 units/L (ref 0–37)
AST: 29 units/L (ref 0–37)
Albumin: 4.1 g/dL (ref 3.5–5.2)
Albumin: 4.2 g/dL (ref 3.5–5.2)
Alkaline Phosphatase: 87 units/L (ref 39–117)
BUN: 16 mg/dL (ref 6–23)
BUN: 17 mg/dL (ref 6–23)
Basophils Absolute: 0.1 10*3/uL (ref 0.0–0.1)
Basophils Relative: 0.6 % (ref 0.0–3.0)
Bilirubin Urine: NEGATIVE
Bilirubin, Direct: 0 mg/dL (ref 0.0–0.3)
Bilirubin, Direct: 0.2 mg/dL (ref 0.0–0.3)
Blood in Urine, dipstick: NEGATIVE
CO2: 26 meq/L (ref 19–32)
Calcium: 10 mg/dL (ref 8.4–10.5)
Chloride: 95 meq/L — ABNORMAL LOW (ref 96–112)
Chloride: 99 meq/L (ref 96–112)
Cholesterol: 123 mg/dL (ref 0–200)
Cholesterol: 131 mg/dL (ref 0–200)
Cholesterol: 132 mg/dL (ref 0–200)
Creatinine, Ser: 0.5 mg/dL (ref 0.4–1.2)
Creatinine, Ser: 0.6 mg/dL (ref 0.4–1.2)
Eosinophils Absolute: 0.3 10*3/uL (ref 0.0–0.7)
Eosinophils Absolute: 0.4 10*3/uL (ref 0.0–0.6)
Eosinophils Relative: 2.4 % (ref 0.0–5.0)
Eosinophils Relative: 3.2 % (ref 0.0–5.0)
GFR calc Af Amer: 127 mL/min
GFR calc non Af Amer: 104.42 mL/min (ref >60)
GFR calc non Af Amer: 105 mL/min
HCT: 37.2 % (ref 36.0–46.0)
HDL: 49.5 mg/dL (ref >39.0)
Ketones, urine, test strip: NEGATIVE
Ketones, urine, test strip: NEGATIVE
LDL Cholesterol: 75 mg/dL (ref 0–99)
Lymphocytes Relative: 16.4 % (ref 12.0–46.0)
Lymphs Abs: 1.2 10*3/uL (ref 0.7–4.0)
MCHC: 34.2 g/dL (ref 30.0–36.0)
MCHC: 35 g/dL (ref 30.0–36.0)
MCV: 87.8 fL (ref 78.0–100.0)
MCV: 91.4 fL (ref 78.0–100.0)
Monocytes Absolute: 0.8 10*3/uL (ref 0.1–1.0)
Monocytes Relative: 8.1 % (ref 3.0–11.0)
Monocytes Relative: 8.5 % (ref 3.0–12.0)
Neutro Abs: 6.8 10*3/uL (ref 1.4–7.7)
Neutrophils Relative %: 70.8 % (ref 43.0–77.0)
Neutrophils Relative %: 74.8 % (ref 43.0–77.0)
Nitrite: NEGATIVE
Pap Smear: NORMAL
Platelets: 254 10*3/uL (ref 150–400)
Platelets: 287 10*3/uL (ref 150.0–400.0)
Potassium: 4.8 meq/L (ref 3.5–5.1)
RBC: 4.17 M/uL (ref 3.87–5.11)
RBC: 4.18 M/uL (ref 3.87–5.11)
RBC: 4.23 M/uL (ref 3.87–5.11)
Specific Gravity, Urine: <1.005
TSH: 2.75 microintl units/mL (ref 0.35–5.50)
Total Bilirubin: 0.3 mg/dL (ref 0.3–1.2)
Total CHOL/HDL Ratio: 2.6
Total CHOL/HDL Ratio: 3
Total CHOL/HDL Ratio: 3
Total Protein: 7.2 g/dL (ref 6.0–8.3)
Triglycerides: 60 mg/dL (ref 0.0–149.0)
Triglycerides: 71 mg/dL (ref 0.0–149.0)
Triglycerides: 83 mg/dL (ref 0–149)
Urobilinogen, UA: NEGATIVE
VLDL: 12 mg/dL (ref 0.0–40.0)
Vit D, 25-Hydroxy: 78 ng/mL (ref 30–89)
WBC Urine, dipstick: NEGATIVE
WBC: 7.9 10*3/uL (ref 4.5–10.5)
WBC: 8.7 10*3/uL (ref 4.5–10.5)
WBC: 9.1 10*3/uL (ref 4.5–10.5)

## 2010-09-24 NOTE — Progress Notes (Signed)
Summary: Lipitor refill  Phone Note Refill Request Message from:  Fax from Pharmacy on April 10, 2010 4:50 PM  Refills Requested: Medication #1:  LIPITOR 10 MG  TABS 1 by mouth at bedtime [facility] [address] - fax [phone]  Initial call taken by: Okey Regal [place],  April 10, 2010 4:50 PM    New/Updated Medications: LIPITOR 10 MG TABS (ATORVASTATIN CALCIUM) 1 by mouth qhs Prescriptions: LIPITOR 10 MG TABS (ATORVASTATIN CALCIUM) 1 by mouth qhs  #30 x 0   Entered by:   Lucious Groves CMA   Authorized by:   Loreen Freud DO   Signed by:   Lucious Groves CMA on 04/10/2010   Method used:   Electronically to        [id number] (retail)       [address]       [place], [place]  [address]       Ph: [phone] or [phone]       Fax: [phone]   RxID:   [id number]

## 2010-09-24 NOTE — Cardiovascular Report (Signed)
Summary: Office Visit Remote   Office Visit Remote   Imported By: Roderic Ovens 05/12/2010 12:41:33  _____________________________________________________________________  External Attachment:    Type:   Image     Comment:   External Document

## 2010-09-24 NOTE — Progress Notes (Signed)
Summary: allergy test---appt scheduled for 06/01/2010  Phone Note Call from Patient Call back at Home Phone [phone]   Caller: pt Call For: young Summary of Call: Pt staets that at last OV Dr. Maple Hudson stated that if the medications were not helping then to cll a few days ahead of her 1 month appt so they can plan for a repeat allergy testing. Pt states medication has made no difference and would like to have testing done at appt on Monday 06-08-10 at 1:45. Please advise.  Initial call taken by: Carron Curie CMA,  May 29, 2010 9:55 AM  Follow-up for Phone Call        This is okay with me and CDY-please make sure patient knows to be here at 130pm and understands no antihistamines, OTC cough syrups, OTC sleep aids for 3 days prior to testing. This includes her Rhinocort, nasalcrom, and Astepro.Reynaldo Minium CMA  May 29, 2010 11:38 AM   HER APPT IS FOR MODAY 06-01-10 AT 145PM NOT 06-08-10. PLEASE MAKE SURE PATIENT IS AWARE OF THIS. Thanks.Reynaldo Minium CMA  May 29, 2010 11:40 AM   Additional Follow-up for Phone Call Additional follow up Details #1::        called and spoke with pt.  pt is aware of her appt date and time on 06/01/2010 at 1:45pm.  Pt is also aware of directions regarding what meds she can and cannot take.  pt verbalized understanding and denied any questions.  Aundra Millet Reynolds LPN  May 29, 2010 11:51 AM

## 2010-09-24 NOTE — Cardiovascular Report (Signed)
Summary: Office Visit   Office Visit   Imported By: Roderic Ovens 10/23/2009 14:00:30  _____________________________________________________________________  External Attachment:    Type:   Image     Comment:   External Document

## 2010-09-24 NOTE — Progress Notes (Signed)
Summary: meds/ allergy test  Phone Note Call from Patient Call back at Home Phone [phone]   Caller: Patient Call For: Roshawn Ayala Summary of Call: pt needs clarification re: meds. she has an ALT scheduled for 6/1.  PT STATES OKAY TO LEAVE DETAILED MSG.   Initial call taken by: Tivis Ringer, CNA,  Jan 16, 2010 3:47 PM  Follow-up for Phone Call        Spoke with pt and she wants to know if okay to keep taking her trazodone 200 mg at bedtime, or will this affect the allergy testing? Please advise, thanks! Vernie Murders  Jan 16, 2010 3:57 PM   Additional Follow-up for Phone Call Additional follow up Details #1::        ok per CDY.Reynaldo Minium CMA  Jan 16, 2010 5:02 PM   [facility] of above ok per pt to leave detailed message. Zackery Barefoot CMA  Jan 16, 2010 5:06 PM

## 2010-09-24 NOTE — Progress Notes (Signed)
Summary: prolia  Phone Note Outgoing Call   Summary of Call: Per Lowella Bandy inform pt that it is going a minimuim of 165. Pt said this is fine. She would be willing to do this. I told her we would call when it came in. Army Fossa CMA  September 24, 2009 2:04 PM

## 2010-09-24 NOTE — Assessment & Plan Note (Signed)
Summary: 1 month follow up visitand ALT-ok per CDY and kcw/kcw   Vital Signs:  Patient profile:   76 year old female Height:      59 inches Weight:      138.25 pounds BMI:     28.02 O2 Sat:      94 % on Room air Pulse rate:   81 / minute BP sitting:   120 / 78  (left arm) Cuff size:   regular  Vitals Entered By: Reynaldo Minium CMA (June 01, 2010 2:05 PM)  O2 Flow:  Room air CC: Allergy Testing   Primary [name]/Referring Shawne Bulow:  Laury Axon  CC:  Allergy Testing.  History of Present Illness: January 21, 2010- Rhinitis, headache, dyspnea.............................Marland Kitchenhusband here Feeling pretty well. She has been off several meds, anticipating skin testing today. She is staying indoors a lot. Allergy profile: Total IgE 6.7; panel negative CBC- WNL, EOS 3.1% Skin Test-  Negative with appropriate controls.  April 23, 2010- Rhinitis, headache, dyspnea.....................Marland Kitchenhusband here Allergy testing had been unremarkable in June. During the summer she noted more nasal irritation and chest tightness with more easy DOE and nose running as she tres to get to the car. We discussed whether that really reflected air quality as an irritant effect. Continues fexofenadine, rhinocort. Discussed saline sparys. Describes sneeze around cats and dogs. Describes watery rhinorhea before food.   June 01, 2010- Rhinitis, headache, chronic bronchitis,  dyspnea.....................Marland Kitchenhusband here . cc: Here for allergy testing.  Stopped Astepro because it made her a little unsteady.  Dry cough and some mild shortness of breath when she first wakes in AM. Sniffing but no mucus from nose or chest, little sneeze or drip. I had to repwar questions today to get her to articulate her symptoms. she kept wanting to talk about what she had tried for them. I wanted to clarify what we should be directing our efforts to do for her.  She admits nervousness today because she is off her trazodone for allergy  testing. Had flu shot.  Skin testing- Minimal reactions of doubtful significance.  She says after previous testing she had persisent erythema of right arm, but didn't tell me at the tim  It began days after the testing, self limited.  Asthma History    Asthma Control Assessment:    Age range: 12+ years    Symptoms: 0-2 days/week    Nighttime Awakenings: 0-2/month    Interferes w/ normal activity: no limitations    SABA use (not for EIB): 0-2 days/week    Asthma Control Assessment: Well Controlled   Preventive Screening-Counseling & Management  Alcohol-Tobacco     Alcohol drinks/day: 0     Smoking Status: never     Passive Smoke Exposure: no     Tobacco Counseling: not indicated; no tobacco use  Current Medications (verified): 1)  Lexapro 10 Mg Tabs (Escitalopram Oxalate) .[name].. 1 By Mouth Once Daily 2)  Nexium 40 Mg  Cpdr (Esomeprazole Magnesium) .[name].. 1 By Mouth Qd 3)  Lipitor 10 Mg Tabs (Atorvastatin Calcium) .[name].. 1 By Mouth Qhs 4)  Spiriva Handihaler 18 Mcg  Caps (Tiotropium Bromide Monohydrate) .[name].. 1 Inh Once Daily 5)  Trazodone Hcl 100 Mg  Tabs (Trazodone Hcl) .... 2 Tabs At Bedtime 6)  Rhinocort Aqua 32 Mcg/act  Susp (Budesonide (Nasal)) .... Once Daily 7)  Multivitamins   Tabs (Multiple Vitamin) .... Once Daily 8)  Adult Aspirin Ec Low Strength 81 Mg  Tbec (Aspirin) .... Once Daily 9)  Vitamin C 1000  Mg  Tabs (Ascorbic Acid) .... Once Daily 10)  Calcium 600 1500 Mg  Tabs (Calcium Carbonate) .[name].. 1 Once Daily 11)  Fexofenadine Hcl 180 Mg  Tabs (Fexofenadine Hcl) .... Take 1 Tablet By Mouth Once A Day 12)  Qvar 80 Mcg/act  Aers (Beclomethasone Dipropionate) .... 2 Puffs Two Times A Day 13)  Vitamin D3 1000 Unit Caps (Cholecalciferol) .... Once Daily 14)  Nasalcrom 5.2 Mg/act Aers (Cromolyn Sodium) .... 2 Sprays Four Times A Day 15)  Astepro 0.15 % Soln (Azelastine Hcl) .[name].. 1-2 Sprays Each Nostril Twice Daily As Needed 16)  Ipratropium Bromide 0.06 % Soln (Ipratropium  Bromide) .[name].. 1-2 Sprays Each Nostril Three Times A Day As Needed Watery Nose 17)  Zostavax [address] Unt/0.27ml Solr (Zoster Vaccine Live) .[name].. 1 Ml Im X1  Allergies (verified): 1)  Neosporin 2)  Panoxyl (Benzoyl Peroxide)  Past History:  Past Surgical History: Last updated: 05/23/2007 Cholecystectomy Tonsillectomy ablation of AF followed by dual chamber pacemaker secondary to brady. 10/19/06 L heart carth--non obs. coronary dz--LAD 50% stenosis mid Rt lA 60 % stenosis  EF 25%  05/10/06 Cataract extraction  Family History: Last updated: 11/14/2009 Family History of CAD Female 1st degree relative <60 S-- MI at 57- and second one at about 55  died from lung dz S son--- cancer, liver Father- died with mental disorder Mother- died severe asthma Sister - died emphysema- smoker  Social History: Last updated: 11/14/2009 Married Originally from [place] Never Smoked Alcohol use-no Drug use-no Regular exercise-yes  Risk Factors: Alcohol Use: 0 (06/01/2010) Caffeine Use: 0 (05/05/2010) Exercise: yes (05/05/2010)  Risk Factors: Smoking Status: never (06/01/2010) Passive Smoke Exposure: no (06/01/2010)  Past Medical History: Asthma  Rhinitis - Skin testing 06/01/10 Negative                         -Allergy profile 11/14/09- Neg, IgE 6.2 Ideopathic eosinophilia Osteoarthritis Osteoporosis Peptic ulcer disease Myocardial infarction, hx of (9/07) Depression atrial flutter s/p ablation     --pacemaker Takotsubo  cardiomyopathy - resolved  Review of Systems      See HPI       The patient complains of shortness of breath with activity, nasal congestion/difficulty breathing through nose, and sneezing.  The patient denies shortness of breath at rest, productive cough, non-productive cough, coughing up blood, chest pain, irregular heartbeats, acid heartburn, indigestion, loss of appetite, weight change, abdominal pain, difficulty swallowing, sore throat, tooth/dental problems,  headaches, itching, ear ache, anxiety, depression, hand/feet swelling, joint stiffness or pain, rash, change in color of mucus, and fever.    Physical Exam  Additional Exam:  General: A/Ox3; pleasant and cooperative, NAD, nervous affect today SKIN: no rash, lesions NODES: no lymphadenopathy HEENT: [place]/AT, EOM- WNL, Conjuctivae- clear, PERRLA, TM-WNL, Nose- mucus on right posteriorly, sniffing, Throat- clear and wnl,  Mallampati  II NECK: Supple w/ fair ROM, JVD- none, normal carotid impulses w/o bruits Thyroid- CHEST: Clear to P&A HEART: RRR, no m/g/r heard ABDOMEN: Soft and nl;  UEA:VWUJ, nl pulses, no edema  NEURO: Grossly intact to observation      Impression & Recommendations:  Problem # 1:  RHINITIS (ICD-472.0) I don't know what her eosinophilia relates to, but don't find Reaginic/ Atopic process.  Non- IgE mediated rhinitis. There is mostly a vasomotor and irritant rhinitis, magnified by her anxiety.   Problem # 2:  ASTHMA (ICD-493.90) Non-allergic asthma. There is an anxiety component. Her dyspnea concerns should be considered multifactorial, with cardiac issues  not overlooked.  Very significant passive smoke exposure growing up in [place] until she moved here in 1950. This would favor more of a low-grade chronic bronchitis with less significant reactive airways disease.   Other Orders: Est. Patient Level III (40981) Allergy Puncture Test (19147) Allergy I.D Test (82956)  Patient Instructions: 1)  Please schedule a follow-up appointment as needed. 2)  I will be happy to see you again if I can help. 3)  Keep f/u with Dr Delton Coombes.

## 2010-09-24 NOTE — Letter (Signed)
Summary: Remote Device Check  [facility], Main Office  [address]   [place], [place] [address]   Phone: [phone]  Fax: [phone]     May 11, 2010 MRN: [id number]   [facility] [address] [place], [place]  [address]   Dear Ms. Robison,   Your remote transmission was recieved and reviewed by your physician.  All diagnostics were within normal limits for you.  __X___Your next transmission is scheduled for:  07-23-2010.  Please transmit at any time this day.  If you have a wireless device your transmission will be sent automatically.   Sincerely,  Vella Kohler

## 2010-09-24 NOTE — Assessment & Plan Note (Signed)
Summary: ALLERGIES///KP   Vital Signs:  Patient profile:   76 year old female Height:      59 inches Weight:      141.25 pounds BMI:     28.63 O2 Sat:      96 % on Room air Pulse rate:   64 / minute BP sitting:   112 / 64  (left arm) Cuff size:   regular  Vitals Entered By: Reynaldo Minium CMA (November 14, 2009 2:44 PM)  O2 Flow:  Room air  Primary Provider/Referring Provider:  Laury Axon  CC:  Allergy Consult-Dr. Delton Coombes.[name]  History of Present Illness: November 14, 2009- 74 yoF seen at kind request of Dr Delton Coombes, coming with husband today for allergy evaluation. She complains of frontal and retrorobital pressure headaches, swelling of cervical nodes, easy exertional dyspnea, much postnasal drip, itching and sneezing. symptoms are perennial, worse in past 2 years. She did overall better when she lived in [place] before moving here in 1981. From this she admits at least some seasonal nasal congestion for that long. Triggers include huse dust, pollens and cats. House, carpet and hardwood, central AC/ heatpump, no basement, no mold. Plastic covering crawl space. Did have dog- gone for past year.  Using Rhinocort, fexofenadine, Qvar and Spiriva.  Current Medications (verified): 1)  Lexapro 10 Mg Tabs (Escitalopram Oxalate) .[name].. 1 By Mouth Once Daily 2)  Nexium 40 Mg  Cpdr (Esomeprazole Magnesium) .[name].. 1 By Mouth Qd 3)  Lipitor 10 Mg  Tabs (Atorvastatin Calcium) .[name].. 1 By Mouth At Bedtime 4)  Spiriva Handihaler 18 Mcg  Caps (Tiotropium Bromide Monohydrate) .[name].. 1 Inh Once Daily 5)  Trazodone Hcl 100 Mg  Tabs (Trazodone Hcl) .... 2 Tabs At Bedtime 6)  Rhinocort Aqua 32 Mcg/act  Susp (Budesonide (Nasal)) .... Once Daily 7)  Multivitamins   Tabs (Multiple Vitamin) .... Once Daily 8)  Adult Aspirin Ec Low Strength 81 Mg  Tbec (Aspirin) .... Once Daily 9)  Vitamin C 1000 Mg  Tabs (Ascorbic Acid) .... Once Daily 10)  Calcium 600 1500 Mg  Tabs (Calcium Carbonate) .[name].. 1 Once Daily 11)  Fexofenadine Hcl  180 Mg  Tabs (Fexofenadine Hcl) .... Take 1 Tablet By Mouth Once A Day 12)  Qvar 80 Mcg/act  Aers (Beclomethasone Dipropionate) .... 2 Puffs Two Times A Day 13)  Vitamin D3 1000 Unit Caps (Cholecalciferol) .... 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 10/19/06 L heart carth--non obs. coronary dz--LAD 50% stenosis mid Rt lA 60 % stenosis  EF 25%  05/10/06 Cataract extraction  Family History: Last updated: 11/14/2009 Family History of CAD Female 1st degree relative <60 S-- MI at 40- and second one at about 84  died from lung dz S son--- cancer, liver Father- died with mental disorder Mother- died severe asthma Sister - died emphysema- smoker  Social History: Last updated: 11/14/2009 Married Originally from [place] Never Smoked Alcohol use-no Drug use-no Regular exercise-yes  Risk Factors: Alcohol Use: 0 (06/23/2009) Caffeine Use: 0 (06/23/2009) Exercise: yes (06/23/2009)  Risk Factors: Smoking Status: never (06/23/2009) Passive Smoke Exposure: no (06/23/2009)  Past Medical History: Asthma Allergic rhinitis Osteoarthritis Osteoporosis Peptic ulcer disease Myocardial infarction, hx of (9/07) Depression atrial flutter s/p ablation     --pacemaker Takotsubo  cardiomyopathy - resolved idiopathic eosinophilia  Family History: Family History of CAD Female 1st degree  relative <60 S-- MI at 72- and second one at about 11  died from lung dz S son--- cancer, liver Father- died with mental disorder Mother- died severe asthma Sister - died emphysema- smoker  Social History: Married Originally from [place] Never Smoked Alcohol use-no Drug use-no Regular exercise-yes  Review of Systems      See HPI       The patient complains of shortness of breath with  activity, shortness of breath at rest, non-productive cough, irregular heartbeats, acid heartburn, indigestion, headaches, nasal congestion/difficulty breathing through nose, sneezing, and ear ache.  The patient denies productive cough, coughing up blood, chest pain, loss of appetite, weight change, abdominal pain, difficulty swallowing, sore throat, tooth/dental problems, itching, anxiety, depression, hand/feet swelling, joint stiffness or pain, rash, change in color of mucus, and fever.    Physical Exam  Additional Exam:  General: A/Ox3; pleasant and cooperative, NAD, SKIN: no rash, lesions NODES: no lymphadenopathy HEENT: [place]/AT, EOM- WNL, Conjuctivae- clear, PERRLA, TM-WNL, Nose- clear, Throat- clear and wnl, Mallampati  II NECK: Supple w/ fair ROM, JVD- none, normal carotid impulses w/o bruits Thyroid- normal to palpation CHEST: Clear to P&A HEART: RRR, no m/g/r heard ABDOMEN: Soft and nl; nml bowel sounds; no organomegaly or masses noted ZOX:WRUE, nl pulses, no edema  NEURO: Grossly intact to observation      Impression & Recommendations:  Problem # 1:  ALLERGIC RHINITIS (ICD-477.9)  We will assess her IgE in vitro first, then have her return off antihistamines for skin testing. Her updated medication list for this problem includes:    Rhinocort Aqua 32 Mcg/act Susp (Budesonide (nasal)) ..... Once daily    Fexofenadine Hcl 180 Mg Tabs (Fexofenadine hcl) .[name]... Take 1 tablet by mouth once a day  Problem # 2:  ASTHMA (ICD-493.90) This seems well controlled now.   Other Orders: Consultation Level III (45409) TLB-CBC Platelet - w/Differential (85025-CBCD) T-"RAST" (Allergy Full Profile) IGE (81191-47829)  Patient Instructions: 1)  Return as able for allergy skin testing. Stop all antihistamines 3 days before skin testing, including cold and allergy meds, otc sleep and cough meds. This includes fexofenadine/ allegra 2)  lab

## 2010-09-24 NOTE — Assessment & Plan Note (Signed)
Summary: rhinitis   Visit Type:  Follow-up Primary Provider/Referring Provider:  Laury Axon  CC:  6 mo bronchiectasis follow-up...the pt c/o sinus congestion w/ incr cough...says her cough was better when on Clarinex and but ins won't cover this medication.  History of Present Illness: [name] is a 76 year old woman with a history of mild bronchiectasis and associated mild airflow limitation.  She also has allergic rhinitis and postnasal drip.  Taking fexofenadine and Rhinocort. Also on Spiriva + QVAR.   ROV 01/28/10 -- returns for f/u of her allergies, bronchiectasis, AFL. Has been seen by Dr Maple Hudson for allergy testing, skin testing negative. Nasal gtt is bothersome when she is outside and at night, white mucous. She is planning to have repeat skin testing Sept 1. Remains on QVAR and Spiriva. Also on fexafenadine + Rhinocort.  ROV 07/29/10 -- f/u for bronchiectasis, mild AFL, chronic rhinitis. She has been seen by Dr Maple Hudson, has negative skin testing since last visit. Has been on loratadine, allegra/fexofenadine, clarinex in the past. She is currently on name brand Allegra, causes her mouth and nose to be more dry, more nose bleeding. Cough stable. She feels more tired on the Allegra. Still on QVAR + Spiriva - although not clear that she has significant AFL, she believes she benefits.   Preventive Screening-Counseling & Management  Alcohol-Tobacco     Alcohol drinks/day: 0     Smoking Status: never     Passive Smoke Exposure: no     Tobacco Counseling: not indicated; no tobacco use  Current Medications (verified): 1)  Lexapro 10 Mg Tabs (Escitalopram Oxalate) .[name].. 1 By Mouth Once Daily 2)  Nexium 40 Mg  Cpdr (Esomeprazole Magnesium) .[name].. 1 By Mouth Qd 3)  Lipitor 10 Mg Tabs (Atorvastatin Calcium) .[name].. 1 By Mouth Qhs 4)  Spiriva Handihaler 18 Mcg  Caps (Tiotropium Bromide Monohydrate) .[name].. 1 Inh Once Daily 5)  Trazodone Hcl 100 Mg  Tabs (Trazodone Hcl) .... 2 Tabs At Bedtime 6)  Rhinocort Aqua 32  Mcg/act  Susp (Budesonide (Nasal)) .... Once Daily 7)  Multivitamins   Tabs (Multiple Vitamin) .... Once Daily 8)  Adult Aspirin Ec Low Strength 81 Mg  Tbec (Aspirin) .... Once Daily 9)  Vitamin C 1000 Mg  Tabs (Ascorbic Acid) .... Once Daily 10)  Calcium 600 1500 Mg  Tabs (Calcium Carbonate) .[name].. 1 Once Daily 11)  Allegra Allergy 180 Mg Tabs (Fexofenadine Hcl) .[name].. 1 By Mouth Daily 12)  Qvar 80 Mcg/act  Aers (Beclomethasone Dipropionate) .... 2 Puffs Two Times A Day 13)  Vitamin D3 1000 Unit Caps (Cholecalciferol) .... Once Daily 14)  Zostavax [address] Unt/0.22ml Solr (Zoster Vaccine Live) .[name].. 1 Ml Im X1  Allergies (verified): 1)  Neosporin 2)  Panoxyl (Benzoyl Peroxide)  Vital Signs:  Patient profile:   76 year old female Height:      59 inches (149.86 cm) Weight:      140.38 pounds (63.81 kg) BMI:     28.46 O2 Sat:      97 % on Room air Temp:     98.1 degrees F (36.72 degrees C) oral Pulse rate:   72 / minute BP sitting:   122 / 74  (right arm) Cuff size:   regular  Vitals Entered By: Michel Bickers CMA (July 29, 2010 1:33 PM)  O2 Sat at Rest %:  97 O2 Flow:  Room air CC: 6 mo bronchiectasis follow-up...the pt c/o sinus congestion w/ incr cough...says her cough was better when on Clarinex, but  ins won't cover this medication Comments Medications reviewed with patient Michel Bickers CMA  July 29, 2010 1:41 PM   Physical Exam  General:  well developed, well nourished, in no acute distress Head:  normocephalic and atraumatic Eyes:  conjunctiva and sclera clear Nose:  no significant congestion Mouth:  no deformity or lesions Neck:  No deformities, masses, or tenderness noted. Chest Wall:  No deformities, masses, or tenderness noted. Lungs:  Normal respiratory effort, chest expands symmetrically. Lungs are clear to auscultation, no crackles or wheezes. Heart:  normal rate, regular rhythm, and no murmur.   Abdomen:  not examined Msk:  no deformity or scoliosis noted with  normal posture Extremities:  no clubbing, cyanosis, edema, or deformity noted Neurologic:  non-focal Skin:  Intact without suspicious lesions or rashes Cervical Nodes:  No lymphadenopathy noted Axillary Nodes:  No palpable lymphadenopathy Psych:  alert and cooperative; normal mood and affect; normal attention span and concentration   Impression & Recommendations:  Problem # 1:  RHINITIS (ICD-472.0) Without ident allergens on skin testing - she prefers the generic fexofenadine, will try an alternative pharmacy ([facility]) - Rhinocort  Problem # 2:  DYSPNEA ON EXERTION (ICD-786.09) Dx of AFL is debatable, but she has always maintained that the BD's make her feel better - continue the QVAR and Spiriva  Her updated medication list for this problem includes:    Spiriva Handihaler 18 Mcg Caps (Tiotropium bromide monohydrate) .[name]... 1 inh once daily    Qvar 80 Mcg/act Aers (Beclomethasone dipropionate) .[name]... 2 puffs two times a day  Medications Added to Medication List This Visit: 1)  Allegra Allergy 180 Mg Tabs (Fexofenadine hcl) .[name].. 1 by mouth daily 2)  Fexofenadine Hcl 180 Mg Tabs (Fexofenadine hcl) .[name].. 1 by mouth once daily  Patient Instructions: 1)  Continue your Spiriva and QVAR 2)  Continue Rhinocort 3)  Try to find generic fexofenadine at an alternative pharmacy. if you need [name] to send a new prescription to another pharmacy call our office and we will do so.  4)  Follow up with Dr Delton Coombes in 6 months or as needed  Prescriptions: FEXOFENADINE HCL 180 MG TABS (FEXOFENADINE HCL) 1 by mouth once daily  #30 x 11   Entered and Authorized by:   Leslye Peer MD   Signed by:   Leslye Peer MD on 07/29/2010   Method used:   Electronically to        [facility]* (retail)       [address] [place], [place]  [address]       Ph: [phone]       Fax: [phone]   RxID:   [id number]

## 2010-09-24 NOTE — Progress Notes (Signed)
Summary: REFILL  Phone Note Refill Request Message from:  Fax from Pharmacy on [facility] FAX [phone]  RHINOCORT AQUA NASAL SPRAY 8GM  Initial call taken by: Barb Merino,  September 08, 2009 8:56 AM    Prescriptions: RHINOCORT AQUA 32 MCG/ACT  SUSP (BUDESONIDE (NASAL)) once daily  #8.6 Millilit x 1   Entered by:   Army Fossa CMA   Authorized by:   Loreen Freud DO   Signed by:   Army Fossa CMA on 09/08/2009   Method used:   Electronically to        [facility] (retail)       [address]       [place], [place]  [address]       Ph: [phone] or [phone]       Fax: [phone]   RxID:   [id number]

## 2010-09-24 NOTE — Assessment & Plan Note (Signed)
Summary: cpx/lab/cbs   Vital Signs:  Patient profile:   76 year old female Height:      59 inches Weight:      138.8 pounds Temp:     98.3 degrees F oral Pulse rate:   72 / minute Pulse rhythm:   regular BP sitting:   120 / 72  (right arm) Cuff size:   regular  Vitals Entered By: Almeta Monas CMA Duncan Dull) (May 05, 2010 9:12 AM) CC: CPX/fasting  Does patient need assistance? Functional Status Self care, Cook/clean, Shopping, Social activities Ambulation Normal Comments pt can do all adls and can read and write  Vision Screening:      Vision Comments: opth- q1y --vision was corrected with cataract surgery 40db HL: Left  Right  Audiometry Comment: grossly normal   Flu Vaccine Consent Questions     Do you have a history of severe allergic reactions to this vaccine? no    Any prior history of allergic reactions to egg and/or gelatin? no    Do you have a sensitivity to the preservative Thimersol? no    Do you have a past history of Guillan-Barre Syndrome? no    Do you currently have an acute febrile illness? no    Have you ever had a severe reaction to latex? no    Vaccine information given and explained to patient? yes    Are you currently pregnant? no    Lot Number:[id number]   Exp Date:02/20/2011   Site Given  Left Deltoid IM  History of Present Illness: Pt here for cpe and labs.  No pap smear.  No complaints.    Preventive Screening-Counseling & Management  Alcohol-Tobacco     Alcohol drinks/day: 0     Smoking Status: never     Passive Smoke Exposure: no     Tobacco Counseling: not indicated; no tobacco use  Caffeine-Diet-Exercise     Caffeine use/day: 0     Does Patient Exercise: yes     Type of exercise: walk     Exercise (avg: min/session): 30-60     Times/week: 5  Hep-HIV-STD-Contraception     HIV Risk: no     Dental Visit-last 6 months yes     Dental Care Counseling: not indicated; dental care within six months     SBE monthly:  yes  Safety-Violence-Falls     Seat Belt Use: 100     Firearms in the Home: no firearms in the home     Smoke Detectors: yes     Violence in the Home: no risk noted     Sexual Abuse: no     Fall Risk: no      Sexual History:  currently monogamous.        Drug Use:  never.    Current Medications (verified): 1)  Lexapro 10 Mg Tabs (Escitalopram Oxalate) .[name].. 1 By Mouth Once Daily 2)  Nexium 40 Mg  Cpdr (Esomeprazole Magnesium) .[name].. 1 By Mouth Qd 3)  Lipitor 10 Mg Tabs (Atorvastatin Calcium) .[name].. 1 By Mouth Qhs 4)  Spiriva Handihaler 18 Mcg  Caps (Tiotropium Bromide Monohydrate) .[name].. 1 Inh Once Daily 5)  Trazodone Hcl 100 Mg  Tabs (Trazodone Hcl) .... 2 Tabs At Bedtime 6)  Rhinocort Aqua 32 Mcg/act  Susp (Budesonide (Nasal)) .... Once Daily 7)  Multivitamins   Tabs (Multiple Vitamin) .... Once Daily 8)  Adult Aspirin Ec Low Strength 81 Mg  Tbec (Aspirin) .... Once Daily 9)  Vitamin C 1000 Mg  Tabs (  Ascorbic Acid) .... Once Daily 10)  Calcium 600 1500 Mg  Tabs (Calcium Carbonate) .[name].. 1 Once Daily 11)  Fexofenadine Hcl 180 Mg  Tabs (Fexofenadine Hcl) .... Take 1 Tablet By Mouth Once A Day 12)  Qvar 80 Mcg/act  Aers (Beclomethasone Dipropionate) .... 2 Puffs Two Times A Day 13)  Vitamin D3 1000 Unit Caps (Cholecalciferol) .... Once Daily 14)  Nasalcrom 5.2 Mg/act Aers (Cromolyn Sodium) .... 2 Sprays Four Times A Day 15)  Astepro 0.15 % Soln (Azelastine Hcl) .[name].. 1-2 Sprays Each Nostril Twice Daily As Needed 16)  Ipratropium Bromide 0.06 % Soln (Ipratropium Bromide) .[name].. 1-2 Sprays Each Nostril Three Times A Day As Needed Watery Nose 17)  Zostavax 04540 Unt/0.32ml Solr (Zoster Vaccine Live) .[name].. 1 Ml Im X1  Allergies (verified): 1)  Neosporin 2)  Panoxyl (Benzoyl Peroxide)  Past History:  Past Medical History: Last updated: 11/14/2009 Asthma Allergic rhinitis Osteoarthritis Osteoporosis Peptic ulcer disease Myocardial infarction, hx of (9/07) Depression atrial flutter s/p  ablation     --pacemaker Takotsubo  cardiomyopathy - resolved idiopathic eosinophilia  Past Surgical History: Last updated: 05/23/2007 Cholecystectomy Tonsillectomy ablation of AF followed by dual chamber pacemaker secondary to brady. 10/19/06 L heart carth--non obs. coronary dz--LAD 50% stenosis mid Rt lA 60 % stenosis  EF 25%  05/10/06 Cataract extraction  Family History: Last updated: 11/14/2009 Family History of CAD Female 1st degree relative <60 S-- MI at 72- and second one at about 73  died from lung dz S son--- cancer, liver Father- died with mental disorder Mother- died severe asthma Sister - died emphysema- smoker  Social History: Last updated: 11/14/2009 Married Originally from [place] Never Smoked Alcohol use-no Drug use-no Regular exercise-yes  Risk Factors: Alcohol Use: 0 (05/05/2010) Caffeine Use: 0 (05/05/2010) Exercise: yes (05/05/2010)  Risk Factors: Smoking Status: never (05/05/2010) Passive Smoke Exposure: no (05/05/2010)  Family History: Reviewed history from 11/14/2009 and no changes required. Family History of CAD Female 1st degree relative <60 S-- MI at 47- and second one at about 63  died from lung dz S son--- cancer, liver Father- died with mental disorder Mother- died severe asthma Sister - died emphysema- smoker  Social History: Reviewed history from 11/14/2009 and no changes required. Married Originally from [place] Never Smoked Alcohol use-no Drug use-no Regular exercise-yes Fall Risk:  no Drug Use:  never  Review of Systems      See HPI General:  Denies chills, fatigue, fever, loss of appetite, malaise, sleep disorder, sweats, weakness, and weight loss. Eyes:  Denies blurring, discharge, double vision, eye irritation, eye pain, halos, itching, light sensitivity, red eye, vision loss-1 eye, and vision loss-both eyes. ENT:  Denies decreased hearing, difficulty swallowing, ear discharge, earache, hoarseness, nasal congestion,  nosebleeds, postnasal drainage, ringing in ears, sinus pressure, and sore throat. CV:  Denies bluish discoloration of lips or nails, chest pain or discomfort, difficulty breathing at night, difficulty breathing while lying down, fainting, fatigue, leg cramps with exertion, lightheadness, near fainting, palpitations, shortness of breath with exertion, swelling of feet, swelling of hands, and weight gain. Resp:  Denies chest discomfort, chest pain with inspiration, cough, coughing up blood, excessive snoring, hypersomnolence, morning headaches, pleuritic, shortness of breath, sputum productive, and wheezing. GI:  Denies abdominal pain, bloody stools, change in bowel habits, constipation, dark tarry stools, diarrhea, excessive appetite, gas, hemorrhoids, indigestion, loss of appetite, nausea, vomiting, vomiting blood, and yellowish skin color. GU:  Denies abnormal vaginal bleeding, decreased libido, discharge, dysuria, genital sores, hematuria, incontinence, nocturia, urinary  frequency, and urinary hesitancy. MS:  Denies joint pain, joint redness, joint swelling, loss of strength, low back pain, mid back pain, muscle aches, muscle , cramps, muscle weakness, stiffness, and thoracic pain. Derm:  Denies changes in color of skin, changes in nail beds, dryness, excessive perspiration, flushing, hair loss, insect bite(s), itching, lesion(s), poor wound healing, and rash. Neuro:  Denies brief paralysis, difficulty with concentration, disturbances in coordination, falling down, headaches, inability to speak, memory loss, numbness, poor balance, seizures, sensation of room spinning, tingling, tremors, visual disturbances, and weakness. Psych:  Denies alternate hallucination ( auditory/visual), anxiety, depression, easily angered, easily tearful, irritability, mental problems, panic attacks, sense of great danger, suicidal thoughts/plans, thoughts of violence, unusual visions or sounds, and thoughts /plans of harming  others. Endo:  Denies cold intolerance, excessive hunger, excessive thirst, excessive urination, heat intolerance, polyuria, and weight change. Heme:  Denies abnormal bruising, bleeding, enlarge lymph nodes, fevers, pallor, and skin discoloration. Allergy:  Denies hives or rash, itching eyes, persistent infections, seasonal allergies, and sneezing; Dr Maple Hudson.   Impression & Recommendations:  Problem # 1:  PREVENTIVE HEALTH CARE (ICD-V70.0)  Orders: Venipuncture (37106) TLB-Lipid Panel (80061-LIPID) TLB-BMP (Basic Metabolic Panel-BMET) (80048-METABOL) TLB-CBC Platelet - w/Differential (85025-CBCD) TLB-Hepatic/Liver Function Pnl (80076-HEPATIC) T-Vitamin D (25-Hydroxy) (26948-54627) Specimen Handling (03500) UA Dipstick w/o Micro (manual) (81002) EKG w/ Interpretation (93000)  Problem # 2:  PACEMAKER, PERMANENT (ICD-V45.01)  Orders: EKG w/ Interpretation (93000)  Problem # 3:  GERD (ICD-530.81)  Her updated medication list for this problem includes:    Nexium 40 Mg Cpdr (Esomeprazole magnesium) .[name]... 1 by mouth qd  Diagnostics Reviewed:  Discussed lifestyle modifications, diet, antacids/medications, and preventive measures. Handout provided.   Orders: EKG w/ Interpretation (93000)  Problem # 4:  DEPRESSION (ICD-311)  Her updated medication list for this problem includes:    Lexapro 10 Mg Tabs (Escitalopram oxalate) .[name]... 1 by mouth once daily    Trazodone Hcl 100 Mg Tabs (Trazodone hcl) .[name]... 2 tabs at bedtime  Orders: Venipuncture (93818) TLB-Lipid Panel (80061-LIPID) TLB-BMP (Basic Metabolic Panel-BMET) (80048-METABOL) TLB-CBC Platelet - w/Differential (85025-CBCD) TLB-Hepatic/Liver Function Pnl (80076-HEPATIC) T-Vitamin D (25-Hydroxy) (29937-16967) Specimen Handling (89381)  Discussed treatment options, including trial of antidpressant medication. Will refer to behavioral health. Follow-up call in in 24-48 hours and recheck in 2 weeks, sooner as needed. Patient  agrees to call if any worsening of symptoms or thoughts of doing harm arise. Verified that the patient has no suicidal ideation at this time.   Problem # 5:  OSTEOPOROSIS (ICD-733.00)  Her updated medication list for this problem includes:    Calcium 600 1500 Mg Tabs (Calcium carbonate) .[name]... 1 once daily    Vitamin D3 1000 Unit Caps (Cholecalciferol) ..... Once daily  Orders: Venipuncture (01751) TLB-Lipid Panel (80061-LIPID) TLB-BMP (Basic Metabolic Panel-BMET) (80048-METABOL) TLB-CBC Platelet - w/Differential (85025-CBCD) TLB-Hepatic/Liver Function Pnl (80076-HEPATIC) T-Vitamin D (25-Hydroxy) (02585-27782) Radiology Referral (Radiology) Specimen Handling (42353) EKG w/ Interpretation (93000)  Bone Density: osteoporosis (08/13/2009) Vit D:45 (06/23/2009)  Problem # 6:  OSTEOARTHRITIS (ICD-715.90)  Her updated medication list for this problem includes:    Adult Aspirin Ec Low Strength 81 Mg Tbec (Aspirin) ..... Once daily  Orders: Venipuncture (61443) TLB-Lipid Panel (80061-LIPID) TLB-BMP (Basic Metabolic Panel-BMET) (80048-METABOL) TLB-CBC Platelet - w/Differential (85025-CBCD) TLB-Hepatic/Liver Function Pnl (80076-HEPATIC) T-Vitamin D (25-Hydroxy) (15400-86761)  Discussed use of medications, application of heat or cold, and exercises.   Problem # 7:  ASTHMA (ICD-493.90)  Her updated medication list for this problem includes:    Spiriva Handihaler  18 Mcg Caps (Tiotropium bromide monohydrate) .[name]... 1 inh once daily    Qvar 80 Mcg/act Aers (Beclomethasone dipropionate) .[name]... 2 puffs two times a day  Orders: Venipuncture (91478) TLB-Lipid Panel (80061-LIPID) TLB-BMP (Basic Metabolic Panel-BMET) (80048-METABOL) TLB-CBC Platelet - w/Differential (85025-CBCD) TLB-Hepatic/Liver Function Pnl (80076-HEPATIC) T-Vitamin D (25-Hydroxy) (29562-13086) EKG w/ Interpretation (93000)  Pulmonary Functions Reviewed: O2 sat: 93 (04/23/2010)  Complete Medication List: 1)  Lexapro  10 Mg Tabs (Escitalopram oxalate) .[name].. 1 by mouth once daily 2)  Nexium 40 Mg Cpdr (Esomeprazole magnesium) .[name].. 1 by mouth qd 3)  Lipitor 10 Mg Tabs (Atorvastatin calcium) .[name].. 1 by mouth qhs 4)  Spiriva Handihaler 18 Mcg Caps (Tiotropium bromide monohydrate) .[name].. 1 inh once daily 5)  Trazodone Hcl 100 Mg Tabs (Trazodone hcl) .... 2 tabs at bedtime 6)  Rhinocort Aqua 32 Mcg/act Susp (Budesonide (nasal)) .... Once daily 7)  Multivitamins Tabs (Multiple vitamin) .... Once daily 8)  Adult Aspirin Ec Low Strength 81 Mg Tbec (Aspirin) .... Once daily 9)  Vitamin C 1000 Mg Tabs (Ascorbic acid) .... Once daily 10)  Calcium 600 1500 Mg Tabs (Calcium carbonate) .[name].. 1 once daily 11)  Fexofenadine Hcl 180 Mg Tabs (Fexofenadine hcl) .... Take 1 tablet by mouth once a day 12)  Qvar 80 Mcg/act Aers (Beclomethasone dipropionate) .... 2 puffs two times a day 13)  Vitamin D3 1000 Unit Caps (Cholecalciferol) .... Once daily 14)  Nasalcrom 5.2 Mg/act Aers (Cromolyn sodium) .... 2 sprays four times a day 15)  Astepro 0.15 % Soln (Azelastine hcl) .[name].. 1-2 sprays each nostril twice daily as needed 16)  Ipratropium Bromide 0.06 % Soln (Ipratropium bromide) .[name].. 1-2 sprays each nostril three times a day as needed watery nose 17)  Zostavax 57846 Unt/0.68ml Solr (Zoster vaccine live) .[name].. 1 ml im x1  Other Orders: Flu Vaccine 77yrs + MEDICARE PATIENTS (N6295) Administration Flu vaccine - MCR (M8413)  Patient Instructions: 1)  Please schedule a follow-up appointment in 6 months .  Prescriptions: LIPITOR 10 MG TABS (ATORVASTATIN CALCIUM) 1 by mouth qhs  #30 x 5   Entered and Authorized by:   Loreen Freud DO   Signed by:   Loreen Freud DO on 05/05/2010   Method used:   Electronically to        [facility] (retail)       [address]       [place], [place]  [address]       Ph: [phone] or [phone]       Fax: [phone]   RxID:   [id number] NEXIUM 40 MG  CPDR (ESOMEPRAZOLE MAGNESIUM) 1 by  mouth QD Brand medically necessary #30 x 11   Entered and Authorized by:   Loreen Freud DO   Signed by:   Loreen Freud DO on 05/05/2010   Method used:   Electronically to        [facility] (retail)       [address]       [place], [place]  [address]       Ph: [phone] or [phone]       Fax: [phone]   RxID:   [id number] LEXAPRO 10 MG TABS (ESCITALOPRAM OXALATE) 1 by mouth once daily  #30 Tablet x 11   Entered and Authorized by:   Loreen Freud DO   Signed by:   Loreen Freud DO on 05/05/2010   Method used:   Electronically to        [facility] (retail)       [address]       [place], [place]  [address]       Ph: [phone] or [phone]       Fax: [phone]   RxID:   [id number] TRAZODONE HCL 100 MG  TABS (TRAZODONE HCL) 2 tabs at bedtime  #60 Tablet x 2   Entered and Authorized by:   Loreen Freud DO   Signed by:   Loreen Freud DO on 05/05/2010   Method used:   Electronically to        [facility] (retail)       [address]       [place], [place]  [address]       Ph: [phone] or [phone]       Fax: [phone]   RxID:   [id number] TRAZODONE HCL 100 MG  TABS (TRAZODONE HCL) 2 tabs at bedtime  #60 Tablet x 2   Entered and Authorized by:   Loreen Freud DO   Signed by:   Loreen Freud DO on 05/05/2010   Method used:   Print then Give to Patient   RxID:   [id number] ZOSTAVAX 19400 UNT/0.65ML SOLR (ZOSTER VACCINE LIVE) 1 ml IM x1  #1 x 0   Entered and Authorized by:   Loreen Freud DO   Signed by:   Loreen Freud DO on 05/05/2010   Method used:   Print then Give to Patient   RxID:   [id number]    EKG  Procedure date:  05/05/2010  Findings:      pacemaker  Bone Density  Procedure date:  08/13/2009  Findings:      osteoporosis   Last PAP:  Normal (05/23/2007 9:01:56 AM) PAP Next Due:  Not Indicated Last Mammogram:  ASSESSMENT: Negative - BI-RADS 1^MM DIGITAL SCREENING (08/04/2009 4:10:00 PM) Mammogram  Result Date:  07/31/2009 Mammogram Result:  normal Mammogram Next Due:  1 yr  Laboratory Results   Urine Tests   Date/Time Reported: May 05, 2010 11:08 AM   Routine Urinalysis   Color: yellow Appearance: Clear Glucose: negative   (Normal Range: Negative) Bilirubin: negative   (Normal Range: Negative) Ketone: negative   (Normal Range: Negative) Spec. Gravity: <1.005   (Normal Range: 1.003-1.035) Blood: negative   (Normal Range: Negative) pH: 7.0   (Normal Range: 5.0-8.0) Protein: negative   (Normal Range: Negative) Urobilinogen: negative   (Normal Range: 0-1) Nitrite: negative   (Normal Range: Negative) Leukocyte Esterace: negative   (Normal Range: Negative)    Comments: Floydene Flock  May 05, 2010 11:08 AM

## 2010-09-24 NOTE — Medication Information (Signed)
Summary: Possible Nonadherence with Lexapro/[employer]  Possible Nonadherence with Lexapro/[employer]   Imported By: Lanelle Bal 09/19/2009 12:24:51  _____________________________________________________________________  External Attachment:    Type:   Image     Comment:   External Document

## 2010-09-24 NOTE — Assessment & Plan Note (Signed)
Summary: allergies, bronchiectasis   Visit Type:  Follow-up Primary Provider/Referring Provider:  Laury Axon  CC:  Bronchiectasis.  The patient c/o sinus headaches and pressure and increased sob with exertion and a dry cough for several weeks.[name]  History of Present Illness: [name] is a 76 year old woman with a history of mild bronchiectasis and associated mild airflow limitation.  She also has allergic rhinitis and postnasal drip.  Taking fexofenadine and Rhinocort. Also on Spiriva + QVAR.   ROV 06/18/09 -- Regular follow-up visit for allergies and bronchiectasis. Recently had some worsening of PND after exposure to cat and dog, now doing a bit better but not back to baseline. No dyspnea. Some daily non-prod cough.   ROV 11/13/09 -- returns for regular f/u. tells me that she has been having more trouble with nasal congestion drainage. Had been doing saline washes, but stopped it because it was burning her nose. She is coughing at night and in the am. Minimally productive. Has been taking her QVAR, Spiriva, Rhinocort, fexofenadine.     Current Medications (verified): 1)  Lexapro 10 Mg Tabs (Escitalopram Oxalate) .[name].. 1 By Mouth Once Daily 2)  Nexium 40 Mg  Cpdr (Esomeprazole Magnesium) .[name].. 1 By Mouth Qd 3)  Lipitor 10 Mg  Tabs (Atorvastatin Calcium) .[name].. 1 By Mouth At Bedtime 4)  Spiriva Handihaler 18 Mcg  Caps (Tiotropium Bromide Monohydrate) .[name].. 1 Inh Once Daily 5)  Trazodone Hcl 100 Mg  Tabs (Trazodone Hcl) .... 2 Tabs At Bedtime 6)  Rhinocort Aqua 32 Mcg/act  Susp (Budesonide (Nasal)) .... Once Daily 7)  Multivitamins   Tabs (Multiple Vitamin) .... Once Daily 8)  Adult Aspirin Ec Low Strength 81 Mg  Tbec (Aspirin) .... Once Daily 9)  Vitamin C 1000 Mg  Tabs (Ascorbic Acid) .... Once Daily 10)  Calcium 600 1500 Mg  Tabs (Calcium Carbonate) .[name].. 1 Once Daily 11)  Fexofenadine Hcl 180 Mg  Tabs (Fexofenadine Hcl) .... Take 1 Tablet By Mouth Once A Day 12)  Qvar 80 Mcg/act  Aers  (Beclomethasone Dipropionate) .... 2 Puffs Two Times A Day 13)  Vitamin D3 1000 Unit Caps (Cholecalciferol) .... Once Daily  Allergies (verified): 1)  Neosporin 2)  Panoxyl (Benzoyl Peroxide)  Vital Signs:  Patient profile:   76 year old female Height:      59 inches (149.86 cm) Weight:      10.38 pounds (4.72 kg) BMI:     2.10 O2 Sat:      95 % on Room air Temp:     97.9 degrees F (36.61 degrees C) oral Pulse rate:   86 / minute BP sitting:   110 / 68  (left arm) Cuff size:   regular  Vitals Entered By: Michel Bickers CMA (November 13, 2009 3:07 PM)  O2 Sat at Rest %:  95 O2 Flow:  Room air CC: Bronchiectasis.  The patient c/o sinus headaches and pressure and increased sob with exertion and a dry cough for several weeks. Comments Medications reviewed with the patient.Michel Bickers CMA  November 13, 2009 3:08 PM   Physical Exam  General:  well developed, well nourished, in no acute distress Head:  normocephalic and atraumatic Eyes:  conjunctiva and sclera clear Nose:  no significant congestion Mouth:  no deformity or lesions Neck:  No deformities, masses, or tenderness noted. Chest Wall:  No deformities, masses, or tenderness noted. Lungs:  Normal respiratory effort, chest expands symmetrically. Lungs are clear to auscultation, no crackles or wheezes. Heart:  normal rate, regular  rhythm, and no murmur.   Abdomen:  not examined Msk:  no deformity or scoliosis noted with normal posture Extremities:  no clubbing, cyanosis, edema, or deformity noted Neurologic:  non-focal Skin:  Intact without suspicious lesions or rashes Cervical Nodes:  No lymphadenopathy noted Axillary Nodes:  No palpable lymphadenopathy Psych:  alert and cooperative; normal mood and affect; normal attention span and concentration   Impression & Recommendations:  Problem # 1:  BRONCHIECTASIS (ICD-494.0) No evidence of acute exacerbation. She has felt that Spiriva has benefitted her, although i'm not sure she  has significant AFL - QVAR + Spiriva  Problem # 2:  ALLERGIC RHINITIS (ICD-477.9) Referral to Dr Maple Hudson for possible skin testing Her updated medication list for this problem includes:    Rhinocort Aqua 32 Mcg/act Susp (Budesonide (nasal)) ..... Once daily    Fexofenadine Hcl 180 Mg Tabs (Fexofenadine hcl) .[name]... Take 1 tablet by mouth once a day  Orders: Allergy Referral  (Allergy) Est. Patient Level III ([address])  Patient Instructions: 1)  Continue your current fexofenadine and Rhinocort 2)  Continue your QVAR and Spiriva 3)  We will arrange for an allergy evaluation by Dr Maple Hudson 4)  Follow up with Dr Delton Coombes in 3 months or as needed   Appended Document: allergies, bronchiectasis    Clinical Lists Changes  Medications: Rx of SPIRIVA HANDIHALER 18 MCG  CAPS (TIOTROPIUM BROMIDE MONOHYDRATE) 1 INH once daily;  #90 x 3;  Signed;  Entered by: Michel Bickers CMA;  Authorized by: Leslye Peer MD;  Method used: Electronically to [id number] [address] [place], [place]  [address], Ph: [phone] or [phone], Fax: [phone] Rx of RHINOCORT AQUA 32 MCG/ACT  SUSP (BUDESONIDE (NASAL)) once daily;  #3 x 3;  Signed;  Entered by: Michel Bickers CMA;  Authorized by: Leslye Peer MD;  Method used: Electronically to [facility] [address] [place], [place]  [address], Ph: [phone] or [phone], Fax: [phone] Rx of FEXOFENADINE HCL 180 MG  TABS (FEXOFENADINE HCL) Take 1 tablet by mouth once a day;  #90 x 3;  Signed;  Entered by: Michel Bickers CMA;  Authorized by: Leslye Peer MD;  Method used: Electronically to [id number] [address], [place], [place]  [address], Ph: [phone] or [phone], Fax: [phone] Rx of QVAR 80 MCG/ACT  AERS (BECLOMETHASONE DIPROPIONATE) 2 puffs two times a day;  #3 x 3;  Signed;  Entered by: Michel Bickers CMA;  Authorized by: Leslye Peer MD;  Method used: Electronically to [id number] [address], [place], [place]  [address], Ph:  [phone] or [phone], Fax: [phone]    Prescriptions: QVAR 80 MCG/ACT  AERS (BECLOMETHASONE DIPROPIONATE) 2 puffs two times a day  #3 x 3   Entered by:   Michel Bickers CMA   Authorized by:   Leslye Peer MD   Signed by:   Michel Bickers CMA on 11/13/2009   Method used:   Electronically to        [facility] (retail)       [address]       [place], [place]  [address]       Ph: [phone] or [phone]       Fax: [phone]   RxID:   [id number] FEXOFENADINE HCL 180 MG  TABS (FEXOFENADINE HCL) Take 1 tablet by mouth once a day  #90 x 3   Entered by:   Michel Bickers CMA   Authorized by:  Leslye Peer MD   Signed by:   Michel Bickers CMA on 11/13/2009   Method used:   Electronically to        [facility] (retail)       [address]       [place], [place]  [address]       Ph: [phone] or [phone]       Fax: [phone]   RxID:   [id number] RHINOCORT AQUA 32 MCG/ACT  SUSP (BUDESONIDE (NASAL)) once daily  #3 x 3   Entered by:   Michel Bickers CMA   Authorized by:   Leslye Peer MD   Signed by:   Michel Bickers CMA on 11/13/2009   Method used:   Electronically to        [facility] (retail)       [address]       [place], [place]  [address]       Ph: [phone] or [phone]       Fax: [phone]   RxID:   [id number] SPIRIVA HANDIHALER 18 MCG  CAPS (TIOTROPIUM BROMIDE MONOHYDRATE) 1 INH once daily  #90 x 3   Entered by:   Michel Bickers CMA   Authorized by:   Leslye Peer MD   Signed by:   Michel Bickers CMA on 11/13/2009   Method used:   Electronically to        [facility] (retail)       [address]       [place], [place]  [address]       Ph: [phone] or [phone]       Fax: [phone]   RxID:   [id number]  The patient requested a 3 month [facility] [address] Michel Bickers CMA  November 13, 2009 3:52 PM

## 2010-09-24 NOTE — Progress Notes (Signed)
Summary: prescript  Phone Note Call from Patient   Caller: Patient Call For: young Summary of Call: need alternative for fexofenadine. leaving to go out of town tomorrow. [facility] Initial call taken by: Rickard Patience,  June 09, 2010 2:09 PM  Follow-up for Phone Call        Pt states her pharmacy does not carry fexofenadine any longer and pt is asking for a prescription for another medication. Pt staets she cannot take name brand allegra, it did not work for her.  I advised that there are several OTC opions but pt wants an rx. Also pt is leaving for [place] in the AM so she needs this RX today. lelase advise.Carron Curie CMA  June 09, 2010 3:51 PM allergies: neosporin, benoxyl  Additional Follow-up for Phone Call Additional follow up Details #1::        Ok to send Clarinex 5  mg, # 30, 1 daily if needed.  No prior auth if insurance won't cover it.  Additional Follow-up by: Waymon Budge MD,  June 09, 2010 5:30 PM    Additional Follow-up for Phone Call Additional follow up Details #2::    pt advised rx sent.Carron Curie CMA  June 09, 2010 5:33 PM   New/Updated Medications: CLARINEX 5 MG TABS (DESLORATADINE) Take 1 tablet by mouth once a day Prescriptions: CLARINEX 5 MG TABS (DESLORATADINE) Take 1 tablet by mouth once a day  #30 x 0   Entered by:   Carron Curie CMA   Authorized by:   Waymon Budge MD   Signed by:   Carron Curie CMA on 06/09/2010   Method used:   Electronically to        [facility] (retail)       [address]       [place], [place]  [address]       Ph: [phone] or [phone]       Fax: [phone]   RxID:   [id number]

## 2010-09-24 NOTE — Assessment & Plan Note (Signed)
Summary: bronchiectasis, rhinitis   Visit Type:  Follow-up Primary Provider/Referring Provider:  Laury Axon  CC:  Bronchiectasis.  The patient says her cough is worse at night. More mucus prod that is white in color. Occ dry cough..  History of Present Illness: [name] is a 76 year old woman with a history of mild bronchiectasis and associated mild airflow limitation.  She also has allergic rhinitis and postnasal drip.  Taking fexofenadine and Rhinocort. Also on Spiriva + QVAR.   ROV 01/28/10 -- returns for f/u of her allergies, bronchiectasis, AFL. Has been seen by Dr Maple Hudson for allergy testing, skin testing negative. Nasal gtt is bothersome when she is outside and at night, white mucous. She is planning to have repeat skin testing Sept 1. Remains on QVAR and Spiriva. Also on fexafenadine + Rhinocort.       Current Medications (verified): 1)  Lexapro 10 Mg Tabs (Escitalopram Oxalate) .[name].. 1 By Mouth Once Daily 2)  Nexium 40 Mg  Cpdr (Esomeprazole Magnesium) .[name].. 1 By Mouth Qd 3)  Lipitor 10 Mg  Tabs (Atorvastatin Calcium) .[name].. 1 By Mouth At Bedtime 4)  Spiriva Handihaler 18 Mcg  Caps (Tiotropium Bromide Monohydrate) .[name].. 1 Inh Once Daily 5)  Trazodone Hcl 100 Mg  Tabs (Trazodone Hcl) .... 2 Tabs At Bedtime 6)  Rhinocort Aqua 32 Mcg/act  Susp (Budesonide (Nasal)) .... Once Daily 7)  Multivitamins   Tabs (Multiple Vitamin) .... Once Daily 8)  Adult Aspirin Ec Low Strength 81 Mg  Tbec (Aspirin) .... Once Daily 9)  Vitamin C 1000 Mg  Tabs (Ascorbic Acid) .... Once Daily 10)  Calcium 600 1500 Mg  Tabs (Calcium Carbonate) .[name].. 1 Once Daily 11)  Fexofenadine Hcl 180 Mg  Tabs (Fexofenadine Hcl) .... Take 1 Tablet By Mouth Once A Day 12)  Qvar 80 Mcg/act  Aers (Beclomethasone Dipropionate) .... 2 Puffs Two Times A Day 13)  Vitamin D3 1000 Unit Caps (Cholecalciferol) .... Once Daily  Allergies (verified): 1)  Neosporin 2)  Panoxyl (Benzoyl Peroxide)  Vital Signs:  Patient profile:   76 year old  female Height:      59 inches (149.86 cm) Weight:      139 pounds (63.18 kg) BMI:     28.18 O2 Sat:      95 % on Room air Temp:     97.6 degrees F (36.44 degrees C) oral Pulse rate:   82 / minute BP sitting:   118 / 72  (right arm) Cuff size:   regular  Vitals Entered By: [name] CMA (January 28, 2010 3:32 PM)  O2 Sat at Rest %:  95 O2 Flow:  Room air CC: Bronchiectasis.  The patient says her cough is worse at night. More mucus prod that is white in color. Occ dry cough. Comments Medications reviewed. Daytime phone verified. [name] CMA  January 28, 2010 3:33 PM   Physical Exam  General:  well developed, well nourished, in no acute distress Head:  normocephalic and atraumatic Eyes:  conjunctiva and sclera clear Nose:  no significant congestion Mouth:  no deformity or lesions Neck:  No deformities, masses, or tenderness noted. Chest Wall:  No deformities, masses, or tenderness noted. Lungs:  Normal respiratory effort, chest expands symmetrically. Lungs are clear to auscultation, no crackles or wheezes. Heart:  normal rate, regular rhythm, and no murmur.   Abdomen:  not examined Msk:  no deformity or scoliosis noted with normal posture Extremities:  no clubbing, cyanosis, edema, or deformity noted Neurologic:  non-focal Skin:  Intact without suspicious lesions or rashes Cervical Nodes:  No lymphadenopathy noted Axillary Nodes:  No palpable lymphadenopathy Psych:  alert and cooperative; normal mood and affect; normal attention span and concentration   Impression & Recommendations:  Problem # 1:  RHINITIS (ICD-472.0)  - continue current allergy regimen - repeat skin testing pending  Orders: Est. Patient Level III ([address])  Problem # 2:  BRONCHIECTASIS (ICD-494.0) - she feels that she has benefitted from Spiriva and QVAR  Patient Instructions: 1)  Please continue your current medications 2)  Get your skin testing as planned by Dr Maple Hudson 3)  Follow up with Dr Delton Coombes  in 6 months or as needed    Immunization History:  Influenza Immunization History:    Influenza:  historical (05/23/2009)

## 2010-09-24 NOTE — Assessment & Plan Note (Signed)
Summary: 3 MONTHS/ MBW   Primary Provider/Referring Provider:  Laury Axon  CC:  3 month follow up visit-breathing fine but has stuffy nose.[name]  History of Present Illness: History of Present Illness: History of Present Illness: November 14, 2009- 74 yoF seen at kind request of Dr Delton Coombes, coming with husband today for allergy evaluation. She complains of frontal and retrorobital pressure headaches, swelling of cervical nodes, easy exertional dyspnea, much postnasal drip, itching and sneezing. symptoms are perennial, worse in past 2 years. She did overall better when she lived in [place] before moving here in 1981. From this she admits at least some seasonal nasal congestion for that long. Triggers include huse dust, pollens and cats. House, carpet and hardwood, central AC/ heatpump, no basement, no mold. Plastic covering crawl space. Did have dog- gone for past year.  Using Rhinocort, fexofenadine, Qvar and Spiriva.  January 21, 2010- Rhinitis, headache, dyspnea.............................Marland Kitchenhusband here Feeling pretty well. She has been off several meds, anticipating skin testing today. She is staying indoors a lot. Allergy profile: Total IgE 6.7; panel negative CBC- WNL, EOS 3.1% Skin Test-  Negative with appropriate controls.  April 23, 2010- Rhinitis, headache, dyspnea.....................Marland Kitchenhusband here Allergy testing had been unremarkable in June. During the summer she noted more nasal irritation and chest tightness with more easy DOE and nose running as she tres to get to the car. We discussed whether that really reflected air quality as an irritant effect. Continues fexofenadine, rhinocort. Discussed saline sparys. Describes sneeze around cats and dogs. Describes watery rhinorhea before food.     Asthma History    Initial Asthma Severity Rating:    Age range: 12+ years    Symptoms: 0-2 days/week    Nighttime Awakenings: 0-2/month    Interferes w/ normal activity: no limitations    SABA  use (not for EIB): 0-2 days/week    Asthma Severity Assessment: Intermittent  Hypertension History:      Positive major cardiovascular risk factors include female age 59 years old or older and family history for ischemic heart disease (females less than 79 years old & males less than 57 years old).  Negative major cardiovascular risk factors include non-tobacco-user status.     Preventive Screening-Counseling & Management  Alcohol-Tobacco     Smoking Status: never     Tobacco Counseling: not indicated; no tobacco use  Current Medications (verified): 1)  Lexapro 10 Mg Tabs (Escitalopram Oxalate) .[name].. 1 By Mouth Once Daily 2)  Nexium 40 Mg  Cpdr (Esomeprazole Magnesium) .[name].. 1 By Mouth Qd 3)  Lipitor 10 Mg Tabs (Atorvastatin Calcium) .[name].. 1 By Mouth Qhs 4)  Spiriva Handihaler 18 Mcg  Caps (Tiotropium Bromide Monohydrate) .[name].. 1 Inh Once Daily 5)  Trazodone Hcl 100 Mg  Tabs (Trazodone Hcl) .... 2 Tabs At Bedtime 6)  Rhinocort Aqua 32 Mcg/act  Susp (Budesonide (Nasal)) .... Once Daily 7)  Multivitamins   Tabs (Multiple Vitamin) .... Once Daily 8)  Adult Aspirin Ec Low Strength 81 Mg  Tbec (Aspirin) .... Once Daily 9)  Vitamin C 1000 Mg  Tabs (Ascorbic Acid) .... Once Daily 10)  Calcium 600 1500 Mg  Tabs (Calcium Carbonate) .[name].. 1 Once Daily 11)  Fexofenadine Hcl 180 Mg  Tabs (Fexofenadine Hcl) .... Take 1 Tablet By Mouth Once A Day 12)  Qvar 80 Mcg/act  Aers (Beclomethasone Dipropionate) .... 2 Puffs Two Times A Day 13)  Vitamin D3 1000 Unit Caps (Cholecalciferol) .... Once Daily  Allergies (verified): 1)  Neosporin 2)  Panoxyl (Benzoyl  Peroxide)  Past History:  Past Medical History: Last updated: 11/14/2009 Asthma Allergic rhinitis Osteoarthritis Osteoporosis Peptic ulcer disease Myocardial infarction, hx of (9/07) Depression atrial flutter s/p ablation     --pacemaker Takotsubo  cardiomyopathy - resolved idiopathic eosinophilia  Past Surgical History: Last updated:  05/23/2007 Cholecystectomy Tonsillectomy ablation of AF followed by dual chamber pacemaker secondary to brady. 10/19/06 L heart carth--non obs. coronary dz--LAD 50% stenosis mid Rt lA 60 % stenosis  EF 25%  05/10/06 Cataract extraction  Family History: Last updated: 11/14/2009 Family History of CAD Female 1st degree relative <60 S-- MI at 85- and second one at about 1  died from lung dz S son--- cancer, liver Father- died with mental disorder Mother- died severe asthma Sister - died emphysema- smoker  Social History: Last updated: 11/14/2009 Married Originally from [place] Never Smoked Alcohol use-no Drug use-no Regular exercise-yes  Risk Factors: Alcohol Use: 0 (06/23/2009) Caffeine Use: 0 (06/23/2009) Exercise: yes (06/23/2009)  Risk Factors: Smoking Status: never (04/23/2010) Passive Smoke Exposure: no (06/23/2009)  Review of Systems      See HPI       The patient complains of nasal congestion/difficulty breathing through nose and sneezing.  The patient denies shortness of breath with activity, shortness of breath at rest, productive cough, non-productive cough, coughing up blood, chest pain, irregular heartbeats, acid heartburn, indigestion, loss of appetite, weight change, abdominal pain, difficulty swallowing, sore throat, tooth/dental problems, headaches, itching, ear ache, rash, change in color of mucus, and fever.    Vital Signs:  Patient profile:   76 year old female Height:      59 inches Weight:      141 pounds BMI:     28.58 O2 Sat:      93 % on Room air Pulse rate:   86 / minute BP sitting:   124 / 72  (left arm) Cuff size:   regular  Vitals Entered By: Reynaldo Minium CMA (April 23, 2010 4:56 PM)  O2 Flow:  Room air  Physical Exam  Additional Exam:  General: A/Ox3; pleasant and cooperative, NAD, SKIN: no rash, lesions NODES: no lymphadenopathy HEENT: /AT, EOM- WNL, Conjuctivae- clear, PERRLA, TM-WNL, Nose- mucus on right posteriorly,  sniffing, Throat- clear and wnl,  Mallampati  II NECK: Supple w/ fair ROM, JVD- none, normal carotid impulses w/o bruits Thyroid- CHEST: Clear to P&A HEART: RRR, no m/g/r heard ABDOMEN: Soft and nl;  ZOX:WRUE, nl pulses, no edema  NEURO: Grossly intact to observation      Impression & Recommendations:  Problem # 1:  RHINITIS (ICD-472.0)  Consider if there are allergic and vasomotor conmponents. I will let her try sample astepro, script iptratropium, and otc cromolyn nasal sprays separately If these don't help, given her strong association with exposure to chldrens cats and outdoors, it may be worthwhile to retest her.  Problem # 2:  ASTHMA (ICD-493.90) Had some chest tightness during the summer, but neve bad, never wheezed. Does not have rescue inhaler, but continues Qvar. Chest now is clear and she says it is "fine".  Medications Added to Medication List This Visit: 1)  Nasalcrom 5.2 Mg/act Aers (Cromolyn sodium) .... 2 sprays four times a day 2)  Astepro 0.15 % Soln (Azelastine hcl) .[name].. 1-2 sprays each nostril twice daily as needed 3)  Ipratropium Bromide 0.06 % Soln (Ipratropium bromide) .[name].. 1-2 sprays each nostril three times a day as needed watery nose  Other Orders: Est. Patient Level III ([address])  Hypertension Assessment/Plan:  The patient's hypertensive risk group is category B: At least one risk factor (excluding diabetes) with no target organ damage.  Her calculated 10 year risk of coronary heart disease is 8 %.  Today's blood pressure is 124/72.     Patient Instructions: 1)  Please schedule a follow-up appointment in 1 month. 2)  If you aren't doing better by then, call a few days ahead so we can make plans to repeat some allergy testing. 3)  Sample Astepro nasal antihistamine spray: 1-2 puffs each nostril up to twice daily if needed 4)  Try otc nasalcrom/ cromolyn nasal spray- follow directions onthe box 5)  Try script ipratropium nasal spray- this may be  especially good used shortly ahead of going out to eat, to prevent watery nose. Prescriptions: IPRATROPIUM BROMIDE 0.06 % SOLN (IPRATROPIUM BROMIDE) 1-2 sprays each nostril three times a day as needed watery nose  #1 x prn   Entered and Authorized by:   Waymon Budge MD   Signed by:   Waymon Budge MD on 04/23/2010   Method used:   Print then Give to Patient   RxID:   [id number] ASTEPRO 0.15 % SOLN (AZELASTINE HCL) 1-2 sprays each nostril twice daily as needed  #1 x prn   Entered and Authorized by:   Waymon Budge MD   Signed by:   Waymon Budge MD on 04/23/2010   Method used:   Samples Given   RxID:   [id number] NASALCROM 5.2 MG/ACT AERS (CROMOLYN SODIUM) 2 sprays four times a day  #1 box x prn   Entered and Authorized by:   Waymon Budge MD   Signed by:   Waymon Budge MD on 04/23/2010   Method used:   Historical   RxID:   [id number]

## 2010-09-24 NOTE — Miscellaneous (Signed)
Summary: Skin Test/[facility]  Skin Test/[facility]   Imported By: Sherian Rein 01/28/2010 13:08:00  _____________________________________________________________________  External Attachment:    Type:   Image     Comment:   External Document

## 2010-09-24 NOTE — Letter (Signed)
Summary: Remote Device Check  [facility], Main Office  [address]   [place], [place] [address]   Phone: [phone]  Fax: [phone]     February 19, 2010 MRN: [id number]   [facility] [address] [place], [place]  [address]   Dear Ms. Grayson,   Your remote transmission was recieved and reviewed by your physician.  All diagnostics were within normal limits for you.  __X___Your next transmission is scheduled for:  04-23-2010.  Please transmit at any time this day.  If you have a wireless device your transmission will be sent automatically.    Sincerely,  Vella Kohler

## 2010-09-24 NOTE — Assessment & Plan Note (Signed)
Summary: allergy testing/lmom for pt to call/apc   Vital Signs:  Patient profile:   76 year old female Height:      59 inches Weight:      138 pounds BMI:     27.97 O2 Sat:      96 % on Room air Pulse rate:   80 / minute BP sitting:   122 / 80  (left arm) Cuff size:   regular  Vitals Entered By: Reynaldo Minium CMA (January 21, 2010 3:05 PM)  O2 Flow:  Room air  Primary Provider/Referring Provider:  Laury Axon  CC:  Allergy testing.  History of Present Illness: History of Present Illness: November 14, 2009- 74 yoF seen at kind request of Dr Delton Coombes, coming with husband today for allergy evaluation. She complains of frontal and retrorobital pressure headaches, swelling of cervical nodes, easy exertional dyspnea, much postnasal drip, itching and sneezing. symptoms are perennial, worse in past 2 years. She did overall better when she lived in [place] before moving here in 1981. From this she admits at least some seasonal nasal congestion for that long. Triggers include huse dust, pollens and cats. House, carpet and hardwood, central AC/ heatpump, no basement, no mold. Plastic covering crawl space. Did have dog- gone for past year.  Using Rhinocort, fexofenadine, Qvar and Spiriva.  January 21, 2010- Rhinitis, headache, dyspnea.............................Marland Kitchenhusband here Feeling pretty well. She has been off several meds, anticipating skin testing today. She is staying indoors a lot. Allergy profile: Total IgE 6.7; panel negative CBC- WNL, EOS 3.1% Skin Test-  Negative with appropriate controls.   Current Medications (verified): 1)  Lexapro 10 Mg Tabs (Escitalopram Oxalate) .[name].. 1 By Mouth Once Daily 2)  Nexium 40 Mg  Cpdr (Esomeprazole Magnesium) .[name].. 1 By Mouth Qd 3)  Lipitor 10 Mg  Tabs (Atorvastatin Calcium) .[name].. 1 By Mouth At Bedtime 4)  Spiriva Handihaler 18 Mcg  Caps (Tiotropium Bromide Monohydrate) .[name].. 1 Inh Once Daily 5)  Trazodone Hcl 100 Mg  Tabs (Trazodone Hcl) .... 2 Tabs At  Bedtime 6)  Rhinocort Aqua 32 Mcg/act  Susp (Budesonide (Nasal)) .... Once Daily 7)  Multivitamins   Tabs (Multiple Vitamin) .... Once Daily 8)  Adult Aspirin Ec Low Strength 81 Mg  Tbec (Aspirin) .... Once Daily 9)  Vitamin C 1000 Mg  Tabs (Ascorbic Acid) .... Once Daily 10)  Calcium 600 1500 Mg  Tabs (Calcium Carbonate) .[name].. 1 Once Daily 11)  Fexofenadine Hcl 180 Mg  Tabs (Fexofenadine Hcl) .... Take 1 Tablet By Mouth Once A Day 12)  Qvar 80 Mcg/act  Aers (Beclomethasone Dipropionate) .... 2 Puffs Two Times A Day 13)  Vitamin D3 1000 Unit Caps (Cholecalciferol) .... Once Daily  Allergies (verified): 1)  Neosporin 2)  Panoxyl (Benzoyl Peroxide)  Past History:  Past Medical History: Last updated: 11/14/2009 Asthma Allergic rhinitis Osteoarthritis Osteoporosis Peptic ulcer disease Myocardial infarction, hx of (9/07) Depression atrial flutter s/p ablation     --pacemaker Takotsubo  cardiomyopathy - resolved idiopathic eosinophilia  Past Surgical History: Last updated: 05/23/2007 Cholecystectomy Tonsillectomy ablation of AF followed by dual chamber pacemaker secondary to brady. 10/19/06 L heart carth--non obs. coronary dz--LAD 50% stenosis mid Rt lA 60 % stenosis  EF 25%  05/10/06 Cataract extraction  Family History: Last updated: 11/14/2009 Family History of CAD Female 1st degree relative <60 S-- MI at 9- and second one at about 34  died from lung dz S son--- cancer, liver Father- died with mental disorder Mother- died severe asthma  Sister - died emphysema- smoker  Social History: Last updated: 11/14/2009 Married Originally from [place] Never Smoked Alcohol use-no Drug use-no Regular exercise-yes  Risk Factors: Alcohol Use: 0 (06/23/2009) Caffeine Use: 0 (06/23/2009) Exercise: yes (06/23/2009)  Risk Factors: Smoking Status: never (06/23/2009) Passive Smoke Exposure: no (06/23/2009)  Review of Systems      See HPI       The patient complains of nasal  congestion/difficulty breathing through nose.  The patient denies shortness of breath with activity, shortness of breath at rest, productive cough, non-productive cough, coughing up blood, chest pain, irregular heartbeats, acid heartburn, indigestion, loss of appetite, weight change, abdominal pain, difficulty swallowing, sore throat, tooth/dental problems, headaches, and sneezing.    Physical Exam  Additional Exam:  General: A/Ox3; pleasant and cooperative, NAD, SKIN: no rash, lesions NODES: no lymphadenopathy HEENT: [place]/AT, EOM- WNL, Conjuctivae- clear, PERRLA, TM-WNL, Nose- clear, Throat- clear and wnl, Mallampati  II NECK: Supple w/ fair ROM, JVD- none, normal carotid impulses w/o bruits Thyroid- CHEST: Clear to P&A HEART: RRR, no m/g/r heard ABDOMEN: Soft and nl;  ZOX:WRUE, nl pulses, no edema  NEURO: Grossly intact to observation      Impression & Recommendations:  Problem # 1:  ALLERGIC RHINITIS (ICD-477.9) Skin testing negative. In vitro testing negative, with low total IgE . These make a reaginic/ Type I allergic response very unlikely. She may be having a vaomotor and irritant response, which would be treated symtomatically. Watch for other explanations for symptoms.  I agreed to see her back once more, to discuss her summer and fall experience, but she is not a candidate for allergy vaccine. We reviewed dust control measures for the home. Her updated medication list for this problem includes:    Rhinocort Aqua 32 Mcg/act Susp (Budesonide (nasal)) ..... Once daily    Fexofenadine Hcl 180 Mg Tabs (Fexofenadine hcl) .[name]... Take 1 tablet by mouth once a day  Problem # 2:  EOSINOPHILIA (ICD-288.3) Cannot identify an atopic trigger.  Other Orders: Est. Patient Level III (45409) Allergy Puncture Test (81191) Allergy I.D Test (47829)  Patient Instructions: 1)  Please schedule a follow-up appointment in 3 months and we will see how you have done through the Summer and Fall  season. 2)  Continue with Dr Delton Coombes 3)  Consider the dust control measures we discussed

## 2010-09-24 NOTE — Miscellaneous (Signed)
Summary: Orders Update  Clinical Lists Changes  Orders: Added new Service order of Est. Patient Level III (99213) - Signed 

## 2010-09-24 NOTE — Assessment & Plan Note (Signed)
Summary: Prolia injection/drb   Vital Signs:  Patient profile:   76 year old female Weight:      140.13 pounds Temp:     98.2 degrees F oral Pulse rate:   87 / minute Pulse rhythm:   regular BP sitting:   120 / 70  (right arm) Cuff size:   large  Vitals Entered By: Army Fossa CMA (November 20, 2009 4:06 PM) CC: Pt here for Prolia injection, Also having HA, neck aches and diarrhea today.   History of Present Illness: Pt here for her prolia injection but also c/o neck pain after having perm done--- it started hurting after having to hold her head in the sink.  Pt is not sleeping well because of it.  Pt had an episode of loose stools today but she thinks it may be from stress.  She is a little worried about her heart.  Cardiology is working that up -- she has had some DOE but it has not gotten worse.    Current Medications (verified): 1)  Lexapro 10 Mg Tabs (Escitalopram Oxalate) .[name].. 1 By Mouth Once Daily 2)  Nexium 40 Mg  Cpdr (Esomeprazole Magnesium) .[name].. 1 By Mouth Qd 3)  Lipitor 10 Mg  Tabs (Atorvastatin Calcium) .[name].. 1 By Mouth At Bedtime 4)  Spiriva Handihaler 18 Mcg  Caps (Tiotropium Bromide Monohydrate) .[name].. 1 Inh Once Daily 5)  Trazodone Hcl 100 Mg  Tabs (Trazodone Hcl) .... 2 Tabs At Bedtime 6)  Rhinocort Aqua 32 Mcg/act  Susp (Budesonide (Nasal)) .... Once Daily 7)  Multivitamins   Tabs (Multiple Vitamin) .... Once Daily 8)  Adult Aspirin Ec Low Strength 81 Mg  Tbec (Aspirin) .... Once Daily 9)  Vitamin C 1000 Mg  Tabs (Ascorbic Acid) .... Once Daily 10)  Calcium 600 1500 Mg  Tabs (Calcium Carbonate) .[name].. 1 Once Daily 11)  Fexofenadine Hcl 180 Mg  Tabs (Fexofenadine Hcl) .... Take 1 Tablet By Mouth Once A Day 12)  Qvar 80 Mcg/act  Aers (Beclomethasone Dipropionate) .... 2 Puffs Two Times A Day 13)  Vitamin D3 1000 Unit Caps (Cholecalciferol) .... Once Daily 14)  Flexeril 10 Mg Tabs (Cyclobenzaprine Hcl) .... 1/2-1 By Mouth Three Times A Day As Needed  Allergies: 1)   Neosporin 2)  Panoxyl (Benzoyl Peroxide)  Past History:  Past medical, surgical, family and social histories (including risk factors) reviewed for relevance to current acute and chronic problems.  Past Medical History: Reviewed history from 05/22/2009 and no changes required. Asthma Osteoarthritis Osteoporosis Peptic ulcer disease Myocardial infarction, hx of (9/07) Depression atrial flutter s/p ablation     --pacemaker Takotsubo  cardiomyopathy - resolved idiopathic eosinophilia  Past Surgical History: Reviewed history from 05/23/2007 and no changes required. Cholecystectomy Tonsillectomy ablation of AF followed by dual chamber pacemaker secondary to brady. 10/19/06 L heart carth--non obs. coronary dz--LAD 50% stenosis mid Rt lA 60 % stenosis  EF 25%  05/10/06 Cataract extraction  Family History: Reviewed history from 06/20/2008 and no changes required. Family History of CAD Female 1st degree relative <60 S-- MI at 92- and second one at about 89  died from lund dz S son--- cancer, liver  Social History: Reviewed history from 05/23/2007 and no changes required. Married Never Smoked Alcohol use-no Drug use-no Regular exercise-yes  Review of Systems      See HPI  Physical Exam  General:  Well-developed,well-nourished,in no acute distress; alert,appropriate and cooperative throughout examination Lungs:  Normal respiratory effort, chest expands symmetrically. Lungs are clear to  auscultation, no crackles or wheezes. Msk:  + muscle spasms and tenderness b/l trap and base of skull normal ROM.   Neurologic:  alert & oriented X3.   Psych:  Oriented X3 and normally interactive.     Impression & Recommendations:  Problem # 1:  OSTEOPOROSIS (ICD-733.00)  Her updated medication list for this problem includes:    Calcium 600 1500 Mg Tabs (Calcium carbonate) .[name]... 1 once daily    Vitamin D3 1000 Unit Caps (Cholecalciferol) ..... Once daily  Bone Density: osteoporosis  (05/19/2007) Vit D:45 (06/23/2009)  Orders: Admin of Therapeutic Inj  intramuscular or subcutaneous (08657) Prolia 60mg  (J3590)  Problem # 2:  CERVICAL STRAIN (ICD-847.0)  Her updated medication list for this problem includes:    Adult Aspirin Ec Low Strength 81 Mg Tbec (Aspirin) ..... Once daily    Flexeril 10 Mg Tabs (Cyclobenzaprine hcl) .[name]... 1/2-1 by mouth three times a day as needed  Discussed exercises and use of moist heat or cold and medication.   Complete Medication List: 1)  Lexapro 10 Mg Tabs (Escitalopram oxalate) .[name].. 1 by mouth once daily 2)  Nexium 40 Mg Cpdr (Esomeprazole magnesium) .[name].. 1 by mouth qd 3)  Lipitor 10 Mg Tabs (Atorvastatin calcium) .[name].. 1 by mouth at bedtime 4)  Spiriva Handihaler 18 Mcg Caps (Tiotropium bromide monohydrate) .[name].. 1 inh once daily 5)  Trazodone Hcl 100 Mg Tabs (Trazodone hcl) .... 2 tabs at bedtime 6)  Rhinocort Aqua 32 Mcg/act Susp (Budesonide (nasal)) .... Once daily 7)  Multivitamins Tabs (Multiple vitamin) .... Once daily 8)  Adult Aspirin Ec Low Strength 81 Mg Tbec (Aspirin) .... Once daily 9)  Vitamin C 1000 Mg Tabs (Ascorbic acid) .... Once daily 10)  Calcium 600 1500 Mg Tabs (Calcium carbonate) .[name].. 1 once daily 11)  Fexofenadine Hcl 180 Mg Tabs (Fexofenadine hcl) .... Take 1 tablet by mouth once a day 12)  Qvar 80 Mcg/act Aers (Beclomethasone dipropionate) .... 2 puffs two times a day 13)  Vitamin D3 1000 Unit Caps (Cholecalciferol) .... Once daily 14)  Flexeril 10 Mg Tabs (Cyclobenzaprine hcl) .... 1/2-1 by mouth three times a day as needed Prescriptions: TRAZODONE HCL 100 MG  TABS (TRAZODONE HCL) 2 tabs at bedtime  #60 Tablet x 2   Entered and Authorized by:   Loreen Freud DO   Signed by:   Loreen Freud DO on 11/20/2009   Method used:   Printed then faxed to ...       [id number] (retail)       [address]       [place], [place]  [address]       Ph: [phone] or [phone]       Fax: [phone]   RxID:    [id number] FLEXERIL 10 MG TABS (CYCLOBENZAPRINE HCL) 1/2-1 by mouth three times a day as needed  #30 x 0   Entered and Authorized by:   Loreen Freud DO   Signed by:   Loreen Freud DO on 11/20/2009   Method used:   Electronically to        [id number] (retail)       [address]       [place], [place]  [address]       Ph: [phone] or [phone]       Fax: [phone]   RxID:   [id number]    Medication Administration  Injection # 1:    Medication: Prolia 60mg     Diagnosis: OSTEOPOROSIS (ICD-733.00)  Route: SQ    Site: R deltoid    Exp Date: 08/2010    Lot #: [id number]    Mfr: amgen    Patient tolerated injection without complications    Given by: Army Fossa CMA (November 20, 2009 4:24 PM)  Orders Added: 1)  Est. Patient Level III [45409] 2)  Admin of Therapeutic Inj  intramuscular or subcutaneous [96372] 3)  Prolia 60mg  [J3590]

## 2010-09-24 NOTE — Letter (Signed)
Summary: Remote Device Check  [facility], Main Office  [address]   [place], [place] [address]   Phone: [phone]  Fax: [phone]     September 02, 2010 MRN: [id number]   [facility] [address] [place], [place]  [address]   Dear Ms. Guzzi,   Your remote transmission was recieved and reviewed by your physician.  All diagnostics were within normal limits for you.   __X____Your next office visit is scheduled for:  11-17-10 @ 345pm with Dr Graciela Husbands.                                Sincerely,  Vella Kohler

## 2010-09-24 NOTE — Assessment & Plan Note (Signed)
Summary: pacer [email or web address]   Primary Provider:  Laury [name]  CC:  pacer check.  History of Present Illness: [name] is very pleasant 76 year old woman with a history of tako-tsubo nonischemic cardiomyopathy which has resolved. She also has a history of atrial flutter and is status post ablation as well as placement of a permanent pacemaker due to profound sinus node dysfunction.      Her biggest complaint today is dyspnea on exertion. It is unassociated with chest discomfort. She thinks that sometimes it is aggravated by cold air but not always. There has been no peripheral edema.  She's also had a significant weight gain. She is going out for sizes and her blue jeans. Her weight has gone from 122 to 137 and 15-16 months.     Current Medications (verified): 1)  Lexapro 10 Mg Tabs (Escitalopram Oxalate) .[name].. 1 By Mouth Once Daily 2)  Nexium 40 Mg  Cpdr (Esomeprazole Magnesium) .[name].. 1 By Mouth Qd 3)  Lipitor 10 Mg  Tabs (Atorvastatin Calcium) .[name].. 1 By Mouth At Bedtime 4)  Spiriva Handihaler 18 Mcg  Caps (Tiotropium Bromide Monohydrate) .[name].. 1 Inh Once Daily 5)  Trazodone Hcl 100 Mg  Tabs (Trazodone Hcl) .... 2 Tabs At Bedtime 6)  Rhinocort Aqua 32 Mcg/act  Susp (Budesonide (Nasal)) .... Once Daily 7)  Multivitamins   Tabs (Multiple Vitamin) .... Once Daily 8)  Adult Aspirin Ec Low Strength 81 Mg  Tbec (Aspirin) .... Once Daily 9)  Vitamin C 1000 Mg  Tabs (Ascorbic Acid) .... Once Daily 10)  Calcium 600 1500 Mg  Tabs (Calcium Carbonate) .[name].. 1 Once Daily 11)  Fexofenadine Hcl 180 Mg  Tabs (Fexofenadine Hcl) .... Take 1 Tablet By Mouth Once A Day 12)  Qvar 80 Mcg/act  Aers (Beclomethasone Dipropionate) .... 2 Puffs Two Times A Day 13)  Vitamin D3 1000 Unit Caps (Cholecalciferol) .... Once Daily  Allergies (verified): 1)  Neosporin 2)  Panoxyl (Benzoyl Peroxide)  Past History:  Past Medical History: Last updated: 05/22/2009 Asthma Osteoarthritis Osteoporosis Peptic ulcer  disease Myocardial infarction, hx of (9/07) Depression atrial flutter s/p ablation     --pacemaker Takotsubo  cardiomyopathy - resolved idiopathic eosinophilia  Past Surgical History: Last updated: 05/23/2007 Cholecystectomy Tonsillectomy ablation of AF followed by dual chamber pacemaker secondary to brady. 10/19/06 L heart carth--non obs. coronary dz--[name] 50% stenosis mid [name] 60 % stenosis  EF 25%  05/10/06 Cataract extraction  Family History: Last updated: 06/20/2008 Family History of CAD Female 1st degree relative <60 S-- MI at 23- and second one at about 40  died from lund dz S son--- cancer, liver  Social History: Last updated: 05/23/2007 Married Never Smoked Alcohol use-no Drug use-no Regular exercise-yes  Vital Signs:  Patient profile:   76 year old female Height:      59 inches Weight:      137 pounds BMI:     27.77 Pulse rate:   67 / minute Pulse rhythm:   regular BP sitting:   107 / 70  (left arm) Cuff size:   large  Vitals Entered By: [name] CMA (October 21, 2009 11:02 AM)  Physical Exam  General:  The patient was alert and oriented in no acute distress. HEENT Normal.  Neck veins were flat, carotids were brisk.  Lungs were clear.  Heart sounds were regular without murmurs or gallops.  Abdomen was soft with active bowel sounds. There is no clubbing cyanosis or edema. Skin Warm and dry    PPM  Specifications Following MD:  [name] Manges, MD     PPM Vendor:  [name]     PPM Model Number:  [name]     PPM Serial Number:  [name] PPM DOI:  10/19/2006     PPM Implanting MD:  [name] Manges, MD  Lead 1    Location: RA     DOI: 10/19/2006     Model #: [id number]     Serial #: [name]     Status: active Lead 2    Location: RV     DOI: 10/19/2006     Model #: [id number]     Serial #: [id number]     Status: active  Magnet Response Rate:  BOL 85 ERI 65  Indications:  Sinus node dysfunction A-flutter ablation 10/19/06  Explantation Comments:   Coumadin Carelink  PPM Follow Up Remote Check?  No Battery Voltage:  2.78 V     Battery Est. Longevity:  6.5 years     Pacer Dependent:  Yes       PPM Device Measurements Atrium  Impedance: 461 ohms, Threshold: 0.75 V at 0.4 msec Right Ventricle  Amplitude: 15.68 mV, Impedance: 476 ohms, Threshold: 0.75 V at 0.4 msec  Episodes MS Episodes:  0     Percent Mode Switch:  0     Coumadin:  No Ventricular High Rate:  0     Atrial Pacing:  99%     Ventricular Pacing:  99.8%  Parameters Mode:  DDDR     Lower Rate Limit:  60     Upper Rate Limit:  150 Paced AV Delay:  180     Sensed AV Delay:  150 Next Remote Date:  01/21/2010     Next Cardiology Appt Due:  10/22/2010 Tech Comments:  No parameter changes.  Device function normal.   Carelink transmissions every 3 months.  ROV 1 year with Dr. Graciela Husbands. [name] Harm, LPN  October 22, 270 11:19 AM   Impression & Recommendations:  Problem # 1:  DYSPNEA ON EXERTION (ICD-786.09)  This may be related to ventricular pacing. There may be ventricular pacing-induced mitral regurgitation. There may be changing her left ventricular function. We'll obtain a stress echo to help look at this.  It may also be related to her weight. There has been a greater than 10% weight gain over the last 16 months. The cause of this is not clear. We'll plan to check her thyroid today Her updated medication list for this problem includes:    Adult Aspirin Ec Low Strength 81 Mg Tbec (Aspirin) ..... Once daily  Her updated medication list for this problem includes:    Adult Aspirin Ec Low Strength 81 Mg Tbec (Aspirin) ..... Once daily  Orders: Stress Echo (Stress Echo)  Problem # 2:  WEIGHT GAIN (ICD-783.1) as above  Problem # 3:  PACEMAKER, PERMANENT (ICD-V45.01) Device parameters and data were reviewed and no changes were made  we do note  that she is 100% ventricularly paced  Problem # 4:  SINOATRIAL NODE DYSFUNCTION (ICD-427.81)  she is also 100% atrial paced;  her heart rate excursion is pretty good Her updated medication list for this problem includes:    Adult Aspirin Ec Low Strength 81 Mg Tbec (Aspirin) ..... Once daily  Her updated medication list for this problem includes:    Adult Aspirin Ec Low Strength 81 Mg Tbec (Aspirin) ..... Once daily  Other Orders: TLB-TSH (Thyroid Stimulating Hormone) [id number])  Patient Instructions: 1)  Your physician recommends that  you HAVE LABS TODAY: TSH 2)  Your physician has requested that you have a stress echocardiogram. For further information please visit [email or web address]  Please follow instruction sheet as given. 3)  Your physician recommends that you schedule a follow-up appointment in: 12 MONTHS WITH DR. Graciela Husbands

## 2010-09-24 NOTE — Cardiovascular Report (Signed)
Summary: Office Visit Remote   Office Visit Remote   Imported By: Roderic Ovens 02/20/2010 16:30:22  _____________________________________________________________________  External Attachment:    Type:   Image     Comment:   External Document

## 2010-09-24 NOTE — Medication Information (Signed)
Summary: Letter Regarding Use of High Risk Meds in the Elderly/[employer]  Letter Regarding Use of High Risk Meds in the Elderly/[employer]   Imported By: Lanelle Bal 02/13/2010 13:12:03  _____________________________________________________________________  External Attachment:    Type:   Image     Comment:   External Document

## 2010-09-24 NOTE — Assessment & Plan Note (Signed)
Summary: Z6X   Primary Provider:  Laury Axon  CC:  1 year follow up. Pt states she is feeling okay..  .  History of Present Illness: [name] is very pleasant 76 year old woman with a history of tako-tsubo nonischemic cardiomyopathy in 9/07 which has resolved. She also has a history of atrial flutter and is status post ablation as well as placement of a permanent pacemaker due to profound sinus node dysfunction.     She returns today for routine f/u.  Feels very good. No CP or SOB. No edema, palpitations, orthopnea or PND. Has pacer checked regularly with Dr. Graciela Husbands.   Current Medications (verified): 1)  Lexapro 10 Mg Tabs (Escitalopram Oxalate) .[name].. 1 By Mouth Once Daily 2)  Nexium 40 Mg  Cpdr (Esomeprazole Magnesium) .[name].. 1 By Mouth Qd 3)  Lipitor 10 Mg Tabs (Atorvastatin Calcium) .[name].. 1 By Mouth Qhs 4)  Spiriva Handihaler 18 Mcg  Caps (Tiotropium Bromide Monohydrate) .[name].. 1 Inh Once Daily 5)  Trazodone Hcl 100 Mg  Tabs (Trazodone Hcl) .... 2 Tabs At Bedtime 6)  Rhinocort Aqua 32 Mcg/act  Susp (Budesonide (Nasal)) .... Once Daily 7)  Multivitamins   Tabs (Multiple Vitamin) .... Once Daily 8)  Adult Aspirin Ec Low Strength 81 Mg  Tbec (Aspirin) .... Once Daily 9)  Vitamin C 1000 Mg  Tabs (Ascorbic Acid) .... Once Daily 10)  Calcium 600 1500 Mg  Tabs (Calcium Carbonate) .[name].. 1 Once Daily 11)  Clarinex 5 Mg Tabs (Desloratadine) .... Take 1 Tablet By Mouth Once A Day 12)  Qvar 80 Mcg/act  Aers (Beclomethasone Dipropionate) .... 2 Puffs Two Times A Day 13)  Vitamin D3 1000 Unit Caps (Cholecalciferol) .... Once Daily 14)  Zostavax 09604 Unt/0.68ml Solr (Zoster Vaccine Live) .[name].. 1 Ml Im X1  Allergies (verified): 1)  Neosporin 2)  Panoxyl (Benzoyl Peroxide)  Past History:  Past Medical History: Last updated: 06/01/2010 Asthma  Rhinitis - Skin testing 06/01/10 Negative                         -Allergy profile 11/14/09- Neg, IgE 6.2 Ideopathic  eosinophilia Osteoarthritis Osteoporosis Peptic ulcer disease Myocardial infarction, hx of (9/07) Depression atrial flutter s/p ablation     --pacemaker Takotsubo  cardiomyopathy - resolved  Review of Systems       As per HPI and past medical history; otherwise all systems negative.   Vital Signs:  Patient profile:   76 year old female Height:      59 inches Weight:      141 pounds BMI:     28.58 Pulse rate:   69 / minute Pulse rhythm:   regular BP sitting:   126 / 72  (left arm) Cuff size:   regular  Vitals Entered By: Judithe Modest CMA (July 08, 2010 3:57 PM)  Physical Exam  General:  Gen: well appearing. no resp difficulty HEENT: normal Neck: supple. no JVD. Carotids 2+ bilat; no bruits. No lymphadenopathy or thryomegaly appreciated. Cor: PMI nondisplaced. Regular rate & rhythm. No rubs, gallops, murmur. Lungs: clear Abdomen: soft, nontender, nondistended. No hepatosplenomegaly. No bruits or masses. Good bowel sounds. Extremities: no cyanosis, clubbing, rash, edema Neuro: alert & orientedx3, cranial nerves grossly intact. moves all 4 extremities w/o difficulty. affect pleasant     PPM Specifications Following MD:  Sherryl Manges, MD     PPM Vendor:  Medtronic     PPM Model Number:  [id number]     PPM Serial Number:  [id number] PPM DOI:  10/19/2006     PPM Implanting MD:  Sherryl Manges, MD  Lead 1    Location: RA     DOI: 10/19/2006     Model #: [id number]     Serial #: [id number]     Status: active Lead 2    Location: RV     DOI: 10/19/2006     Model #: [id number]     Serial #: [id number]     Status: active  Magnet Response Rate:  BOL 85 ERI 65  Indications:  Sinus node dysfunction A-flutter ablation 10/19/06  Explantation Comments:  Coumadin Carelink  PPM Follow Up Pacer Dependent:  Yes      Episodes Coumadin:  No  Parameters Mode:  DDDR     Lower Rate Limit:  60     Upper Rate Limit:  150 Paced AV Delay:  180     Sensed AV Delay:  150  Impression &  Recommendations:  Problem # 1:  CARDIOMYOPATHY (ICD-425.4) Resolved. No further f/u needed.  Problem # 2:  ATRIAL ARRHYTHMIAS (ICD-427.89) Quiescent. Follow-up with pacer clinic as scheduled.

## 2010-09-24 NOTE — Progress Notes (Signed)
Summary: labs needed (lmom 5/18)  Phone Note Other Incoming   Action Taken: Appt scheduled Summary of Call: Needs Labwork Scheduled:  Per Labs from Dec: lipid, hep  272.4   Follow-up for Phone Call        [email or web address] Barban  Jan 07, 2010 10:20 AM  Additional Follow-up for Phone Call Additional follow up Details #1::        patient returned call lab appt scheduled [id number] Additional Follow-up by: Okey Regal [place],  Jan 08, 2010 4:24 PM

## 2010-09-24 NOTE — Cardiovascular Report (Signed)
Summary: Office Visit Remote   Office Visit Remote   Imported By: Roderic Ovens 09/04/2010 11:44:02  _____________________________________________________________________  External Attachment:    Type:   Image     Comment:   External Document

## 2010-09-24 NOTE — Progress Notes (Signed)
Summary: sch reg echo  Phone Note Outgoing Call Call back at [facility] Phone [phone]   Call placed by: Gypsy Balsam RN BSN,  November 20, 2009 8:50 AM Summary of Call: Per Dr Graciela Husbands, patient needs regular echo to evaluate MR.  Next available is 12-08-09 at 4PM.  Pt scheduled for this day and put on wait list for cancellations.  Gypsy Balsam RN BSN  November 20, 2009 8:51 AM

## 2010-09-25 NOTE — Miscellaneous (Signed)
Summary: Skin Test/[facility]  Skin Test/ HealthCare   Imported By: Sherian Rein 06/08/2010 09:22:39  _____________________________________________________________________  External Attachment:    Type:   Image     Comment:   External Document

## 2010-11-04 ENCOUNTER — Ambulatory Visit (INDEPENDENT_AMBULATORY_CARE_PROVIDER_SITE_OTHER)
Admission: RE | Admit: 2010-11-04 | Discharge: 2010-11-04 | Disposition: A | Discharge status: Home / Self Care | Payer: Medicare HMO | Source: Ambulatory Visit | Attending: Family Medicine | Admitting: Family Medicine

## 2010-11-04 ENCOUNTER — Other Ambulatory Visit: Payer: Self-pay | Admitting: Family Medicine

## 2010-11-04 ENCOUNTER — Ambulatory Visit (INDEPENDENT_AMBULATORY_CARE_PROVIDER_SITE_OTHER): Payer: Medicare HMO | Admitting: Family Medicine

## 2010-11-04 ENCOUNTER — Encounter: Payer: Self-pay | Admitting: Family Medicine

## 2010-11-04 DIAGNOSIS — M25559 Pain in unspecified hip: Secondary | ICD-10-CM

## 2010-11-04 DIAGNOSIS — E785 Hyperlipidemia, unspecified: Secondary | ICD-10-CM

## 2010-11-09 ENCOUNTER — Encounter: Payer: Self-pay | Admitting: Internal Medicine

## 2010-11-09 ENCOUNTER — Encounter (INDEPENDENT_AMBULATORY_CARE_PROVIDER_SITE_OTHER): Payer: Medicare HMO | Admitting: Internal Medicine

## 2010-11-09 DIAGNOSIS — I498 Other specified cardiac arrhythmias: Secondary | ICD-10-CM

## 2010-11-09 DIAGNOSIS — Z950 Presence of cardiac pacemaker: Secondary | ICD-10-CM

## 2010-11-09 DIAGNOSIS — I4891 Unspecified atrial fibrillation: Secondary | ICD-10-CM

## 2010-11-10 NOTE — Assessment & Plan Note (Signed)
Summary: 6 MONTH ROA///[facility]   Vital Signs:  Patient profile:   76 year old female Weight:      141.8 pounds BMI:     28.74 Pulse rate:   72 / minute Pulse rhythm:   regular BP sitting:   118 / 72  (left arm) Cuff size:   regular  Vitals Entered By: Almeta Monas CMA Duncan Dull) (November 04, 2010 11:03 AM) CC: 6 month f/u----pt slipped in the shower a few weeks ago and has been having left hip pain   History of Present Illness: Pt here for f/u but she slipped in shower about 3-4 weeks ago and she is better today but still has pain.  It gets worse as day goes on.  Pain is in L hip pain that goes down leg.    Hyperlipidemia follow-up      This is a 76 year old woman who presents for Hyperlipidemia follow-up.  The patient denies muscle aches, GI upset, abdominal pain, flushing, itching, constipation, diarrhea, and fatigue.  The patient denies the following symptoms: chest pain/pressure, exercise intolerance, dypsnea, palpitations, syncope, and pedal edema.  Compliance with medications (by patient report) has been near 100%.  Dietary compliance has been good.  The patient reports no exercise.  Adjunctive measures currently used by the patient include ASA.    Current Medications (verified): 1)  Lexapro 10 Mg Tabs (Escitalopram Oxalate) .[name].. 1 By Mouth Once Daily 2)  Nexium 40 Mg  Cpdr (Esomeprazole Magnesium) .[name].. 1 By Mouth Qd 3)  Lipitor 10 Mg Tabs (Atorvastatin Calcium) .[name].. 1 By Mouth Qhs 4)  Spiriva Handihaler 18 Mcg  Caps (Tiotropium Bromide Monohydrate) .[name].. 1 Inh Once Daily 5)  Trazodone Hcl 100 Mg  Tabs (Trazodone Hcl) .... 2 Tabs At Bedtime 6)  Rhinocort Aqua 32 Mcg/act  Susp (Budesonide (Nasal)) .... Once Daily 7)  Multivitamins   Tabs (Multiple Vitamin) .... Once Daily 8)  Adult Aspirin Ec Low Strength 81 Mg  Tbec (Aspirin) .... Once Daily 9)  Vitamin C 1000 Mg  Tabs (Ascorbic Acid) .... Once Daily 10)  Calcium 600 1500 Mg  Tabs (Calcium Carbonate) .[name].. 1 Once Daily 11)  Qvar 80  Mcg/act  Aers (Beclomethasone Dipropionate) .... 2 Puffs Two Times A Day 12)  Vitamin D3 1000 Unit Caps (Cholecalciferol) .... Once Daily 13)  Zostavax 16109 Unt/0.3ml Solr (Zoster Vaccine Live) .[name].. 1 Ml Im X1 14)  Fexofenadine Hcl 180 Mg Tabs (Fexofenadine Hcl) .[name].. 1 By Mouth Once Daily 15)  Flexeril 10 Mg Tabs (Cyclobenzaprine Hcl) .[name].. 1 By Mouth Three Times A Day  Allergies (verified): 1)  Neosporin 2)  Panoxyl (Benzoyl Peroxide)  Past History:  Family History: Last updated: 11/14/2009 Family History of CAD Female 1st degree relative <60 S-- MI at 55- and second one at about 28  died from lung dz S son--- cancer, liver Father- died with mental disorder Mother- died severe asthma Sister - died emphysema- smoker  Social History: Last updated: 11/14/2009 Married Originally from [place] Never Smoked Alcohol use-no Drug use-no Regular exercise-yes  Risk Factors: Alcohol Use: 0 (07/29/2010) Caffeine Use: 0 (05/05/2010) Exercise: yes (05/05/2010)  Risk Factors: Smoking Status: never (07/29/2010) Passive Smoke Exposure: no (07/29/2010)  Past medical, surgical, family and social histories (including risk factors) reviewed for relevance to current acute and chronic problems.  Past Medical History: Reviewed history from 06/01/2010 and no changes required. Asthma  Rhinitis - Skin testing 06/01/10 Negative                         -  Allergy profile 11/14/09- Neg, IgE 6.2 Ideopathic eosinophilia Osteoarthritis Osteoporosis Peptic ulcer disease Myocardial infarction, hx of (9/07) Depression atrial flutter s/p ablation     --pacemaker Takotsubo  cardiomyopathy - resolved  Past Surgical History: Reviewed history from 05/23/2007 and no changes required. Cholecystectomy Tonsillectomy ablation of AF followed by dual chamber pacemaker secondary to brady. 10/19/06 L heart carth--non obs. coronary dz--LAD 50% stenosis mid Rt lA 60 % stenosis  EF 25%  05/10/06 Cataract  extraction  Family History: Reviewed history from 11/14/2009 and no changes required. Family History of CAD Female 1st degree relative <60 S-- MI at 62- and second one at about 69  died from lung dz S son--- cancer, liver Father- died with mental disorder Mother- died severe asthma Sister - died emphysema- smoker  Social History: Reviewed history from 11/14/2009 and no changes required. Married Originally from [place] Never Smoked Alcohol use-no Drug use-no Regular exercise-yes  Review of Systems      See HPI  Physical Exam  General:  Well-developed,well-nourished,in no acute distress; alert,appropriate and cooperative throughout examination Lungs:  Normal respiratory effort, chest expands symmetrically. Lungs are clear to auscultation, no crackles or wheezes. Heart:  normal rate and no murmur.   Msk:  + weakness L hip flexor 4/5 strength otherwise 5/5 low ext Extremities:  No clubbing, cyanosis, edema, or deformity noted with normal full range of motion of all joints.   Neurologic:  alert & oriented X3, gait normal, and DTRs symmetrical and normal.   Skin:  Intact without suspicious lesions or rashes Cervical Nodes:  No lymphadenopathy noted Psych:  Oriented X3, normally interactive, and good eye contact.     Impression & Recommendations:  Problem # 1:  HYPERLIPIDEMIA (ICD-272.4)  Her updated medication list for this problem includes:    Lipitor 10 Mg Tabs (Atorvastatin calcium) .[name]... 1 by mouth qhs  Labs Reviewed: SGOT: 30 (05/05/2010)   SGPT: 34 (05/05/2010)  Prior 10 Yr Risk Heart Disease: 8 % (04/23/2010)   HDL:39.30 (05/05/2010), 41.50 (01/14/2010)  LDL:70 (05/05/2010), 57 (01/14/2010)  Chol:123 (05/05/2010), 117 (01/14/2010)  Trig:71.0 (05/05/2010), 93.0 (01/14/2010)  Problem # 2:  HIP PAIN, LEFT (ICD-719.45)  Her updated medication list for this problem includes:    Adult Aspirin Ec Low Strength 81 Mg Tbec (Aspirin) ..... Once daily    Flexeril 10 Mg  Tabs (Cyclobenzaprine hcl) .[name]... 1 by mouth three times a day  Orders: T-Hip Comp Left Min 2-views (73510TC) T-Lumbar Spine 2 Views (72100TC) Prescription Created Electronically [phone])  Discussed use of medications, application of heat or cold, and exercises.   Complete Medication List: 1)  Lexapro 10 Mg Tabs (Escitalopram oxalate) .[name].. 1 by mouth once daily 2)  Nexium 40 Mg Cpdr (Esomeprazole magnesium) .[name].. 1 by mouth qd 3)  Lipitor 10 Mg Tabs (Atorvastatin calcium) .[name].. 1 by mouth qhs 4)  Spiriva Handihaler 18 Mcg Caps (Tiotropium bromide monohydrate) .[name].. 1 inh once daily 5)  Trazodone Hcl 100 Mg Tabs (Trazodone hcl) .... 2 tabs at bedtime 6)  Rhinocort Aqua 32 Mcg/act Susp (Budesonide (nasal)) .... Once daily 7)  Multivitamins Tabs (Multiple vitamin) .... Once daily 8)  Adult Aspirin Ec Low Strength 81 Mg Tbec (Aspirin) .... Once daily 9)  Vitamin C 1000 Mg Tabs (Ascorbic acid) .... Once daily 10)  Calcium 600 1500 Mg Tabs (Calcium carbonate) .[name].. 1 once daily 11)  Qvar 80 Mcg/act Aers (Beclomethasone dipropionate) .... 2 puffs two times a day 12)  Vitamin D3 1000 Unit Caps (Cholecalciferol) .... Once daily  13)  Zostavax 16109 Unt/0.54ml Solr (Zoster vaccine live) .[name].. 1 ml im x1 14)  Fexofenadine Hcl 180 Mg Tabs (Fexofenadine hcl) .[name].. 1 by mouth once daily 15)  Flexeril 10 Mg Tabs (Cyclobenzaprine hcl) .[name].. 1 by mouth three times a day  Patient Instructions: 1)  fasting labs  272.4  bmp, hep, lipid 2)  Please schedule a follow-up appointment in 1 year.  Prescriptions: FLEXERIL 10 MG TABS (CYCLOBENZAPRINE HCL) 1 by mouth three times a day  #30 x 0   Entered and Authorized by:   Loreen Freud DO   Signed by:   Loreen Freud DO on 11/04/2010   Method used:   Electronically to        [id number] (retail)       [address]       [place], [place]  [address]       Ph: [phone] or [phone]       Fax: [phone]   RxID:   [id number] ZOSTAVAX 19400 UNT/0.65ML  SOLR (ZOSTER VACCINE LIVE) 1 ml IM x1  #1 x 0   Entered and Authorized by:   Loreen Freud DO   Signed by:   Loreen Freud DO on 11/04/2010   Method used:   Print then Give to Patient   RxID:   [id number] TRAZODONE HCL 100 MG  TABS (TRAZODONE HCL) 2 tabs at bedtime  #60 Tablet x 2   Entered and Authorized by:   Loreen Freud DO   Signed by:   Loreen Freud DO on 11/04/2010   Method used:   Print then Give to Patient   RxID:   [id number] LIPITOR 10 MG TABS (ATORVASTATIN CALCIUM) 1 by mouth qhs  #30 Tablet x 5   Entered and Authorized by:   Loreen Freud DO   Signed by:   Loreen Freud DO on 11/04/2010   Method used:   Electronically to        [id number] (retail)       [address]       [place], [place]  [address]       Ph: [phone] or [phone]       Fax: [phone]   RxID:   [id number]    Orders Added: 1)  T-Hip Comp Left Min 2-views [73510TC] 2)  T-Lumbar Spine 2 Views [72100TC] 3)  Est. Patient Level III [48546] 4)  Prescription Created Electronically [phone]

## 2010-11-19 NOTE — Assessment & Plan Note (Signed)
Summary: PC2...MDT [name]   Primary Provider:  Laury Axon   History of Present Illness: [name] is very pleasant 76 year old woman with a history of tako-tsubo nonischemic cardiomyopathy in 9/07 which has resolved. She also has a history of atrial flutter and is status post ablation as well as placement of a permanent pacemaker due to profound sinus node dysfunction.     She returns today for routine f/u.  Feels quite good. No CP or SOB. No edema, palpitations, orthopnea or PND. She has gained weight which she attributes to her not being able to exercise following a fall in the shower  She has had problems increasingly with dizziness gettingout of shower and or bath  Current Medications (verified): 1)  Lexapro 10 Mg Tabs (Escitalopram Oxalate) .[name].. 1 By Mouth Once Daily 2)  Nexium 40 Mg  Cpdr (Esomeprazole Magnesium) .[name].. 1 By Mouth Qd 3)  Lipitor 10 Mg Tabs (Atorvastatin Calcium) .[name].. 1 By Mouth Qhs 4)  Spiriva Handihaler 18 Mcg  Caps (Tiotropium Bromide Monohydrate) .[name].. 1 Inh Once Daily 5)  Trazodone Hcl 100 Mg  Tabs (Trazodone Hcl) .... 2 Tabs At Bedtime 6)  Rhinocort Aqua 32 Mcg/act  Susp (Budesonide (Nasal)) .... Once Daily 7)  Multivitamins   Tabs (Multiple Vitamin) .... Once Daily 8)  Adult Aspirin Ec Low Strength 81 Mg  Tbec (Aspirin) .... Once Daily 9)  Vitamin C 1000 Mg  Tabs (Ascorbic Acid) .... Once Daily 10)  Calcium 600 1500 Mg  Tabs (Calcium Carbonate) .[name].. 1 Once Daily 11)  Qvar 80 Mcg/act  Aers (Beclomethasone Dipropionate) .... 2 Puffs Two Times A Day 12)  Vitamin D3 1000 Unit Caps (Cholecalciferol) .... Once Daily 13)  Zostavax 16109 Unt/0.50ml Solr (Zoster Vaccine Live) .[name].. 1 Ml Im X1 14)  Flexeril 10 Mg Tabs (Cyclobenzaprine Hcl) .[name].. 1 By Mouth Three Times A Day As Needed 15)  Allegra Allergy 180 Mg Tabs (Fexofenadine Hcl) .... Take One Tablet Once Daily  Allergies (verified): 1)  Neosporin 2)  Panoxyl (Benzoyl Peroxide)  Past History:  Past Medical History: Last  updated: 06/01/2010 Asthma  Rhinitis - Skin testing 06/01/10 Negative                         -Allergy profile 11/14/09- Neg, IgE 6.2 Ideopathic eosinophilia Osteoarthritis Osteoporosis Peptic ulcer disease Myocardial infarction, hx of (9/07) Depression atrial flutter s/p ablation     --pacemaker Takotsubo  cardiomyopathy - resolved  Past Surgical History: Last updated: 05/23/2007 Cholecystectomy Tonsillectomy ablation of AF followed by dual chamber pacemaker secondary to brady. 10/19/06 L heart carth--non obs. coronary dz--LAD 50% stenosis mid Rt lA 60 % stenosis  EF 25%  05/10/06 Cataract extraction  Family History: Last updated: 11/14/2009 Family History of CAD Female 1st degree relative <60 S-- MI at 64- and second one at about 70  died from lung dz S son--- cancer, liver Father- died with mental disorder Mother- died severe asthma Sister - died emphysema- smoker  Social History: Last updated: 11/14/2009 Married Originally from [place] Never Smoked Alcohol use-no Drug use-no Regular exercise-yes  Vital Signs:  Patient profile:   76 year old female Height:      59 inches Weight:      141 pounds BMI:     28.58 Pulse rate:   88 / minute Pulse rhythm:   regular BP sitting:   117 / 79  (left arm) Cuff size:   regular  Vitals Entered By: Judithe Modest CMA (November 09, 2010 3:57 PM)  Physical Exam  General:  The patient was alert and oriented in no acute distress. HEENT Normal.  Neck veins were flat, carotids were brisk.  Lungs were clear.  Heart sounds were regular without murmurs or gallops.  Abdomen was soft with active bowel sounds. There is no clubbing cyanosis trace edema Skin Warm and dry  Lungs:  Normal respiratory effort, chest expands symmetrically. Lungs are clear to auscultation, no crackles or wheezes. Heart:  normal rate and no murmur.   Abdomen:  soft non tender Msk:  Back normal, normal gait. Muscle strength and tone normal. Extremities:   no edema  Neurologic:  alert ond oriented grossly  normal motor and sensory function    PPM Specifications Following MD:  Sherryl Manges, MD     PPM Vendor:  [employer]tronic     PPM Model Number:  [id number]     PPM Serial Number:  [id number] PPM DOI:  10/19/2006     PPM Implanting MD:  Sherryl Manges, MD  Lead 1    Location: RA     DOI: 10/19/2006     Model #: [id number]     Serial #: [id number]     Status: active Lead 2    Location: RV     DOI: 10/19/2006     Model #: [id number]     Serial #: [id number]     Status: active  Magnet Response Rate:  BOL 85 ERI 65  Indications:  Sinus node dysfunction A-flutter ablation 10/19/06  Explantation Comments:  Coumadin Carelink  PPM Follow Up Pacer Dependent:  Yes      Episodes Coumadin:  No  Parameters Mode:  DDDR     Lower Rate Limit:  60     Upper Rate Limit:  150 Paced AV Delay:  180     Sensed AV Delay:  150  Impression & Recommendations:  Problem # 1:  ATRIAL ARRHYTHMIAS (ICD-427.89) holding sinus rhtyhm Her updated medication list for this problem includes:    Adult Aspirin Ec Low Strength 81 Mg Tbec (Aspirin) ..... Once daily  Problem # 2:  PACEMAKER, PERMANENT (ICD-V45.01) Device parameters and data were reviewed and no changes were made  Problem # 3:  SINOATRIAL NODE DYSFUNCTION (ICD-427.81) she is 100% atrial paced and with long intrinsic conduction she is also v paced; echo apr 2011 showed normal lVEF with modest LAE    Adult Aspirin Ec Low Strength 81 Mg Tbec (Aspirin) ..... Once daily  Patient Instructions: 1)  Your physician recommends that you continue on your current medications as directed. Please refer to the Current Medication list given to you today. 2)  Your physician wants you to follow-up in:  YEAR WITH DR Logan Bores will receive a reminder letter in the mail two months in advance. If you don't receive a letter, please call our office to schedule the follow-up appointment.

## 2010-11-19 NOTE — Cardiovascular Report (Signed)
Summary: Office Visit   Office Visit   Imported By: Roderic Ovens 11/13/2010 14:48:18  _____________________________________________________________________  External Attachment:    Type:   Image     Comment:   External Document

## 2010-11-30 ENCOUNTER — Other Ambulatory Visit: Payer: Self-pay | Admitting: Family Medicine

## 2010-11-30 NOTE — Telephone Encounter (Signed)
Per Pharmacy Rx on file from 11-04-10 #30 5 no need to refill med now

## 2010-12-07 ENCOUNTER — Other Ambulatory Visit: Payer: Self-pay | Admitting: Emergency Medicine

## 2010-12-23 ENCOUNTER — Other Ambulatory Visit: Payer: Self-pay | Admitting: Family Medicine

## 2011-01-05 NOTE — Assessment & Plan Note (Signed)
[facility]                         ELECTROPHYSIOLOGY OFFICE NOTE   NAME:[name], [name] GERVASE                        MRN:          [id number]  DATE:01/17/2007                            DOB:          18-Jan-1935    [name] comes in.  She is status post floater ablation, as well as  status post pacemaker implantation because of profound sinus node  dysfunction.  This all occurred in the context of Takotsubo which has  recovered with a normal left ventricular function.   She has no complaints.  She says she is not able to keep up with her 6  foot, 1 inch daughter-in-law, not surprisingly, as she is only about 5  feet tall.  When she does her own walking, she has no complaints.   She is also having problems with sleep disturbance and urinary  hesitancy.  Her Lasix was discontinued because of hypotension.   OTHER MEDICATIONS:  1. Trazodone 100.  2. Clarinex.  3. __________ 180.  4. Reglan 10 q.i.d.  5. Aspirin.  6. Lexapro 10.  7. Nexium 40 b.i.d.  8. Lipitor 10.  9. Spiriva.  10.__________.  11.Coumadin.   PHYSICAL EXAMINATION:  VITAL SIGNS:  Her blood pressure is 105/74, pulse  76.  LUNGS:  Clear.  HEART:  Heart sounds were regular.  EXTREMITIES:  Without edema.   Interrogation of her Medtronic pacemaker demonstrates a P wave of 5.6  with impedance of 456 and threshold of 0.75 at 0.4.  The R wave is 22.4  with impedance of 508 and threshold of 0.75 at 0.4.  The R wave is 2.75.  The device was reprogrammed to maximize longevity.  The upper rate level  was changed from 130 to 150.   IMPRESSION:  1. Sinus node dysfunction.  2. Status post pacer for the above.  3. History of atrial flutter status post ablation without evidence of      recurrence.  4. Takotsubo with resolution.  5. Sleep disturbance and urinary hesitancy.   From a cardiac point of view, [name] is doing well.  We will  discontinue her Coumadin at this time, as there have  been no  intercurrent episodes of atrial fibrillation as detected by her device  following her flutter ablation.  I have asked that she follow up with  Dr. Laury Axon concerning her urinary hesitancy and to keep an eye on her  blood pressure.   We will see her again in 9 months time.     Duke Salvia, MD, [facility]  Electronically Signed    SCK/MedQ  DD: 01/17/2007  DT: 01/17/2007  Job #: [id number]   cc:   Lelon Perla, DO

## 2011-01-05 NOTE — Assessment & Plan Note (Signed)
[facility]                             PULMONARY OFFICE NOTE   NAME:[name] [name] ESPIRITU                        MRN:          [id number]  DATE:01/04/2007                            DOB:          1934/08/29    This is a very pleasant 76 year old female who I follow here for mild  asthma and asthmatic bronchitis.  She also is known to have issues with  idiopathic eosinophilia, recent issues with takotsubo cardiomyopathy in  September 2007 with an EF of 25%.  Most recently EF now improved 55-65%.  The patient is status post pacemaker insertion.  The patient is  maintained on Spiriva due to intolerance to long-acting beta-agonists.  She is also on Q-Var.  She continues to have some difficulties with  dyspnea on exertion, however this is doing much better and is likely  related to her most recent issues with takotsubo cardiomyopathy.  She is  to undergo cardiac rehab once her issues with the pacemaker are settled.  Apparently there are issues with the rate control with this device.   The patient does have issues with idiopathic eosinophilia.  She is  followed by Dr. Si Gaul for this.   Most recently the patient had a sleep study.  This was done on April 23.  This showed that the patient did not have any obstructive sleep apnea  and also did not have any issues with significant nocturnal  desaturations.  For this reason, her nocturnal oxygen has been  discontinued.   Overall the patient feels better.  She feels that every day she gets  stronger, feels much better since her pacemaker was inserted.   CURRENT MEDICATIONS:  As noted on the intake sheet.  These have been  reviewed and are accurate.   PHYSICAL EXAMINATION:  VITAL SIGNS:  Noted.  Oxygen saturation 97% on  room air.  IN GENERAL:  This is a well-developed, well-nourished female who is in  no acute distress.  HEENT EXAMINATION:  Unremarkable.  NECK:  Supple.  No adenopathy noted.  No  JVD.  LUNGS:  Clear to auscultation bilaterally.  CARDIAC EXAMINATION:  Regular rate and rhythm.  No rubs, murmurs, or  gallops.  LOWER EXTREMITIES:  No cyanosis.  No clubbing.  No edema noted.   IMPRESSION:  1. Mild asthma and asthmatic bronchitis.  The patient is very well-      compensated on current regimen of Spiriva and Q-Var.  2. History of takotsubo cardiomyopathy and atrial arrhythmias, now      improved.  The patient is also status post pacemaker.  3. Idiopathic eosinophilia, followed by Dr. Si Gaul.   PLAN:  1. The patient is to continue medications as they are.  2. Her oxygen has been discontinued as there is no need for this and      the patient has had a negative sleep study.  3. The patient is to continue Spiriva and Q-Var.  4. Next followup will be in three months' time with Dr. Levy Pupa      who the patient has requested to follow on  her asthma issues.     Gailen Shelter, MD  Electronically Signed    CLG/MedQ  DD: 01/04/2007  DT: 01/04/2007  Job #: [id number]   cc:   [name] Perla, DO  [name] Salvia, MD, [facility]  [name] [name], M.D.

## 2011-01-05 NOTE — Assessment & Plan Note (Signed)
[facility]                         ELECTROPHYSIOLOGY OFFICE NOTE   NAME:[name], [name]                        MRN:          [id number]  DATE:10/03/2007                            DOB:          [birth year]    [name] is status post pacemaker implantation for sinus node  dysfunction and now device dependence.  She also has paroxysmal atrial  fibrillation and atrial flutter ablation.   She also has a history of Takotsubo which has now normalized.   She was noted to have increased blood pressure today which is quite  unusual for her.  She is on no antihypertensives.   Her blood pressure initially was 140/84 and when I retook it after 25  minutes or so it was 150/92, her pulse was 66.  LUNGS:  Clear.  HEART:  Heart sounds were regular.  EXTREMITIES:  Without edema.   Interrogation of her Medtronic ADAPTA pulse generator demonstrates that  she has an atrial paced rhythm, the impedance is 474, threshold is 0.5  at 0.4, the R-wave is 11, impedance of 472 with threshold of 1 volt at  0.4. Battery voltage was 2.79.   IMPRESSION:  1. Sinus node dysfunction.  2. Status post atrial flutter ablation.  3. Status post Takotsubo.  4. Exercise intolerance with question of modest reactive airways      disease.   [name] is stable.  Will make sure she follows up with Dr. Delton Coombes about  her dyspnea. Will plan to see her again 1 year's time.     Duke Salvia, MD, [facility]  Electronically Signed    SCK/MedQ  DD: 10/03/2007  DT: 10/05/2007  Job #: [id number]

## 2011-01-05 NOTE — Assessment & Plan Note (Signed)
[facility]                         ELECTROPHYSIOLOGY OFFICE NOTE   NAME:[name], [name]                        MRN:          [id number]  DATE:10/10/2008                            DOB:          [birth year]    [name] is seen in followup for sinus node dysfunction, progressive  conduction system disease, status post pacemaker implantation, all  occurring in the setting of Takotsubo and prior atrial flutter ablation.  She saw Dr. Gala Romney in the summer.  He reprogrammed her AV delay, so  that she would paced with a short AV delay as her intrinsic first-degree  AV block was very long.  She thinks that she feels better some since  then.   CURRENT MEDICATIONS:  Spiriva, Lipitor, Lexapro, Nexium, fexofenadine,  and trazodone.   PHYSICAL EXAMINATION:  VITAL SIGNS:  [name] blood pressure today was  108/70, her pulse was 72.  Her weight was 127, which is up 5 pounds in  the last 6 months.  LUNGS:  Clear.  HEART:  Heart sounds were regular.  ABDOMEN:  Soft.  EXTREMITIES:  No edema.   Interrogation of her pacemaker demonstrated normal function with  intrinsic conduction accessible, but only with a very long AV delay.  She had no intrinsic atrial rate.  Her impedance was 442.  The threshold  was 0.625 at 0.4.  The R-wave was 50 with impedance of 476.  The  threshold was 1.125 at 0.4.  Heart rate excursion was adequate.   IMPRESSION:  1. Sinus node dysfunction with atrial standstill.  2. Progressive first-degree atrioventricular block.  3. Status post pacemaker for sinus node dysfunction with atrial      standstill, now resulting in atrioventricular pacing for      progressive first-degree atrioventricular block.  4. Takotsubo with previously impaired left ventricular function most      recently with normal left ventricular function.  5. Atrial flutter, status post ablation.   [name] is doing better.  We will continue on her current medications  with the current programmed settings and she will follow up with Dr.  Gala Romney in about 6 months.  I will see her in about 1 year.  She will  be followed remotely in the interim.     Duke Salvia, MD, [facility]  Electronically Signed    SCK/MedQ  DD: 10/10/2008  DT: 10/10/2008  Job #: [id number]

## 2011-01-05 NOTE — Assessment & Plan Note (Signed)
[facility]                            CARDIOLOGY OFFICE NOTE   NAME:[name], [name]                        MRN:          [id number]  DATE:02/22/2008                            DOB:          1935/03/26    PRIMARY CARE PHYSICIAN:  Lelon Perla, DO   INTERVAL HISTORY:  [name] is a very pleasant 76 year old woman with a  history of Tako-Tsubo nonischemic cardiomyopathy, which is resolved.  She also has a history of a atrial flutter and is status post ablation  as well as placement of a permanent pacemaker due to profound sinus node  dysfunction.   She returns today for routine followup.  Overall, she is doing very  well.  She does note that in the heat, she gets a little bit short of  breath while walking and occasionally lightheaded.  She states she can  go as far as she wants in the mall without any problems.  She has not  had any chest pain or syncope.   CURRENT MEDICATIONS:  1. Spiriva.  2. Rhinocort.  3. Multivitamin.  4. Lipitor 10 a day.  5. Lexapro 10 a day.  6. QVAR.  7. Nexium.  8. Aspirin 81.  9. Vitamin C.  10.Allegra.  11.Trazodone.  12.Calcium.  13.Forteo.   PHYSICAL EXAMINATION:  GENERAL:  She is well appearing, in no acute  distress, ambulatory around the clinic, without any respiratory  difficulty.  VITAL SIGNS:  Blood pressure is 110/65, heart rate is 77, and weight is  121.  HEENT:  Normal.  NECK:  Supple.  No JVD.  Carotids are 2+ bilaterally.  No bruits.  There  is no lymphadenopathy or thyromegaly.  CARDIAC:  PMI is nondisplaced.  Regular rate and rhythm.  No murmurs,  rubs, or gallops.  LUNGS:  Clear.  ABDOMEN:  Soft, nontender, and nondistended.  No hepatosplenomegaly.  No  bruits.  No masses.  Good bowel sounds.  EXTREMITIES:  Warm without cyanosis, clubbing or edema.  No rash.  NEURO:  She is alert and oriented x3.  Cranial nerves II-XII are intact.  Moves all 4 extremities without difficulty.  Affect is  pleasant.   EKG shows atrial pacing with marked PR interval of 370 msec.  There are  nonspecific ST-T wave abnormalities.   ASSESSMENT AND PLAN:  1. History of Tako-Tsubo cardiomyopathy.  This is resolved.  She is      doing well.  2. Atrial flutter status post ablation.  She is maintaining her sinus      rhythm.  She does have profound sinus node dysfunction and is      atrial paced.  There is markedly prolonged PR interval.  I have      discussed this with Dr. Graciela Husbands.  We will attempt to reprogram her      Medtronic device to help with this.   DISPOSITION:  I will see her back in 1 year for yearly followup.     [name], MD  Electronically Signed    DRB/MedQ  DD: 02/22/2008  DT: 02/23/2008  Job #:  [id number] 

## 2011-01-05 NOTE — Assessment & Plan Note (Signed)
[facility]                             PULMONARY OFFICE NOTE   NAME:[name], [name] BOEVE                        MRN:          [id number]  DATE:05/02/2007                            DOB:          26-Feb-1935    PULMONARY FOLLOW-UP VISIT:   SUBJECTIVE:  [name] is a 76 year old woman who has been followed in  the past by Dr. Gailen Shelter in our office for bronchiectasis and  some very mild associated air flow limitation.  She has a history of  postnasal drip and seasonal allergies, with associated cough and  frequent episodes of bronchitis and flares of her bronchiectasis.  Her  other significant history includes atrial flutter, status post ablation  and pacemaker placement, as well as an idiopathic eosinophilia, for  which she has been treated in the past with Gleevec.  Her Gleevec was  discontinued after she developed a cardiomyopathy.  It is suspected that  her cardiomyopathy was Takotsubo's disease.  She now has improved LV  function.  She tells me that she continues to do fairly well.  She has  some shortness of breath when she walks quickly.  She occasionally has  to stop to rest with a brisk walk after 10 minutes.  She has been tried  off of bronchodilators in the past.  She is currently maintained on  Spiriva and Qvar.  She feels that the bronchodilators do help her.   CURRENT MEDICATIONS:  1. Spiriva 1 inhalation daily.  2. Rhinocort 2 sprays each nostril daily.  3. Multivitamin once daily.  4. Lexapro 10 mg daily.  5. Lipitor 10 mg daily.  6. Qvar 40 mcg 1 inhalation b.i.d.  7. Nexium 40 mg daily.  8. Aspirin 81 mg daily.  9. Vitamin C 1000 mg daily.  10.Fexofenadine 180 mg daily.  11.Trazodone 200 mg daily.  12.Calcium plus vitamin D 1 daily.   PHYSICAL EXAMINATION:  GENERAL:  This is a pleasant, comfortable woman  in no distress on room air.  VITAL SIGNS:  Her weight is 115 pounds, temperature 97.8, blood pressure  94/60, heart  rate 63, SPO2 97% on room air.  HEENT:  She has some mild posterior pharyngeal erythema.  NECK:  Supple, without lymphadenopathy or stridor.  LUNGS:  Clear to auscultation bilaterally.  She does not wheeze on a  forced expiration.  HEART:  Regular, without murmur.  ABDOMEN:  Soft, nontender, nondistended, with positive bowel sounds.  EXTREMITIES:  Had no cyanosis, clubbing, or edema.   IMPRESSION:  1. Mild bronchiectasis, with associated mild air flow limitation.  She      is currently on an anticholinergic and inhaled steroid.  I am not      sure that she needs this degree of maintenance therapy, but she has      missed these medications when they have been discontinued.  We      discussed another possible trial of discontinuation of her Spiriva,      and we will consider this at our next visit.  For now, she will  continue Qvar and Spiriva as ordered.  2. History of cardiomyopathy and atrial arrhythmias.  3. Idiopathic eosinophilia, now resolved, status post Gleevec therapy.     Leslye Peer, MD  Electronically Signed    RSB/MedQ  DD: 05/02/2007  DT: 05/03/2007  Job #: [id number]   cc:   Lelon Perla, DO

## 2011-01-05 NOTE — Assessment & Plan Note (Signed)
[facility]                            CARDIOLOGY OFFICE NOTE   NAME:[name], [name]                        MRN:          [id number]  DATE:01/30/2007                            DOB:          1934/11/24    PRIMARY CARE PHYSICIAN:  Dr. Loreen Freud.   INTERVAL HISTORY:  [name] is very pleasant 76 year old woman with a  history of tako-tsubo nonischemic cardiomyopathy which has resolved.  She also has a history of atrial flutter and is status post ablation as  well as placement of a permanent pacemaker due to profound sinus node  dysfunction.  She returns today for routine followup.  She says she is  doing great, she is very active keeping up with her grandkids, she  denies any chest pain or shortness of breath.  She has not had any lower  extremity edema, no syncope or presyncope.  Her Lasix was discontinued  due to hypotension.  She recently saw Dr. Graciela Husbands in the pacemaker clinic  a week ago and was doing fine.   CURRENT MEDICATIONS:  1. Spiriva.  2. Rhinocort.  3. Lipitor 10 mg a day.  4. Lexapro 10 mg a day.  5. Qvar 40 mg inhaler b.i.d.  6. Nexium 40 b.i.d.  7. Aspirin 81 daily.  8. Clarinex.  9. Trazodone.  10.Allegra.   PHYSICAL EXAMINATION:  She is well-appearing, no acute distress,  ambulates around the clinic without any respiratory difficulty.  Blood  pressure is 106/78, heart rate is 67, weight is 117.  HEENT:  Normal.  NECK:  Supple, no JVD, carotids are 2+ bilaterally without any bruits,  there is no lymphadenopathy or thyromegaly.  CARDIAC:  PMI is nondisplaced, she has a regular rate and rhythm; no  murmurs, rubs, or gallops.  LUNGS:  Clear.  ABDOMEN:  Soft, nontender, nondistended.  There is no  hepatosplenomegaly, no bruits, no masses, good bowel sounds.  EXTREMITIES:  Warm with no cyanosis, clubbing, or edema.  No rash.  NEURO:  She is alert and oriented x3, cranial nerves II-XII intact,  moves all 4 extremities without  difficulty, affect is very pleasant.   ASSESSMENT/PLAN:  1. A history of take-tsubo cardiomyopathy which is resolved, ejection      fraction is now normal.  2. History of sick sinus syndrome, history of atrial flutter, and      significant sinus node dysfunction.  She is status post on atrial      flutter ablation and permanent pacemaker and followed by Dr. Graciela Husbands.   DISPOSITION:  [name] is doing wonderfully.  We will see her back in  one year's time for routine followup.     [name], MD  Electronically Signed    DRB/MedQ  DD: 01/30/2007  DT: 01/31/2007  Job #: [id number]   cc:   Lelon Perla, DO

## 2011-01-08 NOTE — Op Note (Signed)
NAME:  [name], [name]                           ACCOUNT NO.:  [id number]   MEDICAL RECORD NO.:  [id number]                   PATIENT TYPE:  OUT   LOCATION:  CARD                                 FACILITY:  [facility]   PHYSICIAN:  Oley Balm. Sung Amabile, M.D. [facility]          DATE OF BIRTH:  1935/07/29   DATE OF PROCEDURE:  02/04/2004  DATE OF DISCHARGE:  02/04/2004                                 OPERATIVE REPORT   PROCEDURE:  Cardiopulmonary stress test.   INDICATIONS FOR PROCEDURE:  Exertional dyspnea.   DESCRIPTION OF PROCEDURE:  Cardiopulmonary stress testing was performed on a  graded treadmill using a modified Bruce protocol.  Testing was stopped due  to dyspnea. Effort was maximal.  At peak exercise, oxygen uptake was 1.096  liters/minute of 65% of predicted maximum indicating mild exercise  impairment.   At peak exercise, heart rate was 133 or 86% of predicted maximum indicating  that her cardiovascular limitation was reached.  Oxygen pulse was normal  suggesting normal stroke volume.  Blood pressure response was normal.  EKG  tracings revealed no definite ischemic changes.   At peak exercise minute ventilation was 59 liters/minute or 81% of predicted  maximum indicating that ventilatory limitation was reached. Gas exchange  parameters revealed increased dead space. Baseline spirometry revealed no  obstruction but there was an increased residual volume.  Post exercise  spirometry was not done.   SUMMARY:  Mild exercise impairment due to simultaneous cardiovascular  ventilatory limitations.  No definitive cardiovascular pathology is noted.  Increased dead space ventilation is noted.  This is a rather nonspecific  pattern of limitation and could be attributable to deconditioning, mild  cardiovascular disease such as congestive heart failure or mild obstructive  lung disease (manifesting as increased residual volume) with gas trapping.                                               Oley Balm Sung Amabile, M.D. [facility]    DBS/MEDQ  D:  02/13/2004  T:  02/14/2004  Job:  [id number]   cc:   Salena Saner, M.D.

## 2011-01-08 NOTE — Consult Note (Signed)
[name], [name]                 ACCOUNT NO.:  [id number]   MEDICAL RECORD NO.:  [id number]          PATIENT TYPE:  INP   LOCATION:  3729                         FACILITY:  [facility]   PHYSICIAN:  John C. Madilyn Fireman, M.D.    DATE OF BIRTH:  06/01/1935   DATE OF CONSULTATION:  DATE OF DISCHARGE:                                   CONSULTATION   REASON FOR CONSULTATION:  GI bleeding.   HISTORY OF PRESENT ILLNESS:  The patient is a 76 year old white female with  a history of peptic ulcer disease with EGD in 2005 and 2006, neither of  which showed any active peptic disease, who noted some dark stool with red  tinge beginning about 5 or 6 days ago while she was at the beach.  She has  had some vague periumbilical and epigastric discomfort, and mildly  increasing weakness, dizziness, and orthostasis.  She was seen at Dr.  Donavan Burnet office 2 days ago and was noted to have heme-positive stools.  Lab  work was drawn and her hemoglobin was 10.3, which was down from 12.1 on  April 08, 2006.  Her INR was 9.4.  She had been started on Coumadin on  April 12, 2006 for atrial flutter.  She saw [facility]  yesterday and was admitted.  She has been hemodynamically stable, but still  passing dark, bloody stools with a hemoglobin of 9.9.  Her last charted INR,  before today, was 4.8.  She has been given vitamin K to correct her  coagulopathy.  Her BUN and creatinine are both normal.  She states she has  had a sigmoidoscopy by Dr. Matthias Hughs, but is not aware of ever having a full  colonoscopy.   PAST MEDICAL HISTORY:  Asthmatic bronchitis, history of recurrent pneumonia,  history of peptic ulcer disease with GI bleed in 2004, osteoporosis,  gastroesophageal reflux, recent onset of atrial flutter.   ALLERGIES:  NO KNOWN DRUG ALLERGIES.   MEDICATIONS ON ADMISSION:  Demeclocycline 150 mg b.i.d., Spiriva, Coumadin  currently on hold, aspirin 81 mg daily, Advair, iron, Rhinocort, trazodone,  Nexium, Claritin, and ProAir.   SOCIAL HISTORY:  The patient is married, she lives in [place].  She  denies any alcohol or tobacco use.   FAMILY HISTORY:  Essentially noncontributory.   PHYSICAL EXAMINATION:  GENERAL:  Well-developed, well-nourished white female  in no acute distress.  HEART:  Regular rate and rhythm with occasional irregularity in rhythm as  atrial flutter with a controlled rate of about 80.  LUNGS:  Clear.  ABDOMEN:  Soft, nondistended with normoactive bowel sounds.  No  hepatosplenomegaly, mass, or guarding.   IMPRESSION:  Gastrointestinal bleeding associated with over-anticoagulation  with Coumadin.  She appears hemodynamically stable at present.  The patient  has eaten breakfast and lunch today.  We will hold her n.p.o. after midnight  for esophagogastroduodenoscopy.  I suspect she will need colonoscopy at some  point and probably to follow her esophagogastroduodenoscopy if no upper  gastrointestinal source of bleeding is found.           ______________________________  John C. Madilyn Fireman, M.D.     JCH/MEDQ  D:  04/30/2006  T:  05/01/2006  Job:  [id number]   cc:   Madolyn Frieze. Jens Som, MD, [facility]  Bernette Redbird, M.D.  Loreen Freud, M.D.

## 2011-01-08 NOTE — H&P (Signed)
[name], [name]                 ACCOUNT NO.:  [id number]   MEDICAL RECORD NO.:  [id number]          PATIENT TYPE:  INP   LOCATION:  2316                         FACILITY:  [facility]   PHYSICIAN:  Madolyn Frieze. Jens Som, MD, FACCDATE OF BIRTH:  Sep 11, 1934   DATE OF ADMISSION:  05/08/2006  DATE OF DISCHARGE:                                HISTORY & PHYSICAL   CHIEF COMPLAINT:  Shortness of breath.   HISTORY OF PRESENT ILLNESS:  [name] is a very pleasant 76 year old woman  with a history of atrial flutter, hypertension, history of GI bleed, and  recurrent pneumonia, who was discharged from [facility] on  May 06, 2006, after an admission for blood in her stool and treatment  for atrial flutter.  At that time, she had an upper and lower endoscopy that  did not reveal any source for her bleeding, however, she did have  supratherapeutic INR at that time and it was thought that this was the cause  for her GI bleed.  She had her coagulopathy reversed and was observed.  Additionally, she was treated for atrial flutter which she had been  diagnosed with a month prior with electrical cardioversion with good  success.  As a result she was discharged in relatively good health on  May 06, 2006.  Of note an echocardiogram during that admission showed  that she had a normal left ventricular ejection fraction with mild MR.  The  patient states that 2 days ago she developed a brief fever as well as a  cough and she had progressive shortness of breath that became worse  yesterday.  Last night she states that she did not sleep at all secondary to  orthopnea.  She does describe some left-sided chest pressure that radiates  to the right side that she describes as cramping, however, denies any  vomiting or diaphoresis.  She does mention that she does have some nausea.  She states that she has dyspnea on exertion that is new that started over  the past few days and notes some slight lower  extremity edema.  Otherwise,  she denies any problems with any dysuria, hematuria, or melena, or  hematochezia.   She was seen in the [facility] emergency department  where chest x-ray was notable for bilateral pleural effusions as well as EKG  that showed a sinus tachycardia with lateral T wave inversions.  She had a  positive troponin of 0.4 and an MB of 9 and is being started on heparin and  being to [facility].  Additionally, she was noted to have an INR of  2.9 on admission.   PAST MEDICAL HISTORY:  1. Significant for atrial flutter status post TEE and DC cardioversion on      May 05, 2006.  2. Hypertension.  3. Chronic dyspnea secondary to asthmatic bronchitis and mild      bronchiectasis.  4. 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  5. Recurrent pneumonia.  6. History of peptic ulcer disease with GI bleed due to duodenal ulcer in      2004.  7. History of a GI bleed secondary to supratherapeutic INR in September of      2007.   ALLERGIES:  NEOSPORIN, PEROXIDE.   CURRENT MEDICATIONS:  1. Coumadin 2 mg daily.  2. Gleevec 400 mg daily.  3. Spiriva one puff daily.  4. Advair 250/50 mg twice daily.  5. Meclocycline 150 mg twice daily.  6. Trazodone 200 mg q.h.s.  7. Nexium 40 mg twice daily.  8. Multivitamin daily.  9. Metoprolol 50 mg b.i.d.  10.Clarinex daily.  11.Lisinopril 10 mg daily.  12.Mega-C 1 gram daily.  13.Potassium chloride 20 mEq once daily.   REVIEW OF SYSTEMS:  Please see the HPI.  Reviewing her previous notes, it  shows that her lower extremity is chronic.  Otherwise, remaining 18-point  review of systems is as the HPI, otherwise negative.   SOCIAL HISTORY:  The patient lives in [place] and is married.  She denies  any habits.  She lives with her husband and son.   FAMILY HISTORY:  Mother died at the age of 14 due to asthma.  Her father  died at age of 66 of unknown causes.  There is a history of premature   coronary artery disease in her sister with a heart attack in her 30s.   PHYSICAL EXAMINATION:  VITAL SIGNS:  Blood pressure on admission was 125/80  with a pulse of 105, O2 saturations 97% on 2 liters nasal cannula,  temperature 98.0, respiratory rate 18-20.  GENERAL:  She is alert and oriented x3 and in no acute distress.  She is a  thin appearing woman who looks her stated age.  HEENT:  JVP is flat.  She has 2+ carotid upstrokes, symmetric bilaterally.  No carotid bruits.  LUNGS:  Decreased breath sounds at the bases bilaterally, however, without  any crackles or rhonchi.  There is no wheezes and she has good airway  movement in her upper lobes bilaterally.  HEART:  Tachy, but regular.  Normal S1 and S2 without any audible murmurs,  rubs, or gallops.  ABDOMEN:  Positive bowel sounds, soft, nontender, and nondistended.  EXTREMITIES:  Trace bilateral lower extremity edema.  2+ radial and  posterior tibialis pulses symmetric bilaterally.  NEUROLOGY:  Nonfocal.  She is moving all extremities spontaneously.  RECTAL:  Pending.   LABORATORY DATA:  Chest x-ray done at [facility] shows  bilateral what appears to be pleural effusions versus atelectasis versus  infiltrate.  Her cardiac silhouette was normal in size.  There is no  pulmonary edema.   White count is 9.4, hemoglobin 12.9, platelets 321.  Sodium 125, this is  down from a discharge sodium of 133, potassium 4.1, chloride 95, bicarb 22,  BUN 11, creatinine 0.7, glucose 166, calcium 9.4.  CK 297, MB 9.9, second  set is 9.1.  Her first troponin was 0.4, second troponin of 0.48.  Myoglobin  was 129, second set is 67.9.  INR is 2.9 with PTT of 32, BNP 1160.   EKG done at [facility] shows a sinus tachycardia with  normal intervals.  She has poor R wave progression and she has an isolated Q  in V1.  She has T wave inversions laterally, however, she does not have any frank ST elevation.  She has  minimal, probably 0.5 mm of ST elevation in  leads V3, V4, and currently does not have an EKG for comparison.  Also it  appears that she has T wave inversions in 1 and  2 and aVF.   ASSESSMENT:  1. Non-ST elevation myocardial infarction.  2. Congestive heart failure, most likely secondary to #1.  3. History of atrial flutter status post DC cardioversion.  4. History of gastrointestinal bleed.  5. Recurrent pneumonias.   PLAN:  1. Non-ST elevation MI.  The patient has been admitted to the ICU.  She      was started on heparin at [facility].  Currently,      guaiac is pending.  She has been continued on her metoprolol at this      time as well as her Lisinopril.  She will also be started on aspirin 81      mg a day.  We have currently held off on starting a 2B3A given her      history of a GI bleed recently as well as at this point in time it is      unclear when she will be going for a cardiac catheterization.  It is      unclear whether this is a rate-related demand ischemia or a primary      plaque rupture.  At this time we will follow serial enzymes and serial      biomarkers.  She will also have a cardiac echo in the morning.  We will      check a lipid panel and start her on a statin as well.  2. Congestive heart failure.  The patient comes in with heart failure      symptoms as well as chest x-ray evidence of air space disease.  She      also has an elevated BNP.  It is unclear whether this is related to her      tachycardia which does not appear to be atrial flutter, or if the      tachycardia is secondary to her congestive heart failure which is      secondary to an acute coronary syndrome.  At this time, we will diurese      her with Lasix 40 mg IV q.12 hours and we will continue her on      Lisinopril and potassium supplements.  We will keep her on her dose of      beta blocker as it currently stands given that she is not significantly      bradycardic or  hypotensive.  As mentioned above, she will have an echo      in the morning and further management will be dictated by the findings      on that echo.  3. History of atrial flutter.  The patient currently is not in atrial      flutter.  Based on her EKG, she came in with sinus tachycardia.  A      repeat EKG shows that she is in a sinus rhythm.  We have held her      Coumadin and actually we will reverse it with 5 mg of vitamin K orally      given that the patient will need to be evaluated for atrial flutter      ablation at some point in time.  More importantly, we will need to      bring down the INR given that she will most likely need a cardiac      catheterization during this hospitalization stay and will need to have      her INR in a safe range to perform this procedure.  Once  again, ECP      should be contacted in regards to longterm management of this woman who      has a history of atrial flutter given that she was scheduled for follow-     up as an outpatient, however, her more acute issue is her non-ST      elevation MI.  4. Recurrent pneumonias and history of idiopathic eosinophilia.  Currently      we will continue her on Gleevec at this point in time.  It is difficult      to assess whether she has an infiltrative process that is causing the      bilateral opacities on her chest x-ray or that fluid from congestive      heart failure.  At this point in time, given that she is afebrile on      admission, does not have a productive cough, we will culture her and      follow her clinically.  If she does spike, we will start empiric      antibiotics.  5. Hypertension.  Currently well controlled on her current regimen of      medications.  6. History of GI bleed.  Rectal is pending.  Currently the patient's      hematocrit is stable and she denies any melena or hematochezia.  We      will continue to follow her hematocrits daily.  She will continue on      her Nexium  b.i.d.     ______________________________  Eston Esters. Sherryll Burger, MD    ______________________________  Madolyn Frieze. Jens Som, MD, [facility]    BRS/MEDQ  D:  05/08/2006  T:  05/09/2006  Job:  [id number]

## 2011-01-08 NOTE — Op Note (Signed)
NAMEJAZSMIN, [name]                 ACCOUNT NO.:  [id number]   MEDICAL RECORD NO.:  [id number]          PATIENT TYPE:  INP   LOCATION:  3729                         FACILITY:  [facility]   PHYSICIAN:  Bernette Redbird, M.D.   DATE OF BIRTH:  [birth year]   DATE OF PROCEDURE:  05/02/2006  DATE OF DISCHARGE:                                 OPERATIVE REPORT   PROCEDURE:  Colonoscopy.   INDICATIONS:  A 76 year old female admitted to the hospital 2 days ago with  rectal bleeding but had negative endoscopy.  This occurred while over-  anticoagulated with an INR of  9.4.  Consideration is being given for  cardiac interventions because of a history of atrial flutter, for which  reason that they would like to re-anticoagulate the patient again but  clearance of the colon was desired since the patient has never had full  colonoscopy previously.   FINDINGS:  Sigmoid diverticulosis.   DESCRIPTION OF PROCEDURE:  The patient provided written consent for the  procedure.  Sedation was fentanyl 75 mcg and Versed 7 mg IV without any  change in her baseline cardiac rhythm which appeared to be coarse atrial  flutter, no clinical instability or desaturation.   The Olympus adjustable tension pediatric video colonoscope was advanced with  moderate difficulty through a very fixated sigmoid region, where quite a bit  of diverticular disease was present.  I was able get all the way around the  colon to the cecum, as identified by clear visualization of the appendiceal  orifice and what also appeared to be the ileocecal valve.  I could not enter  the terminal ileum.  Transillumination at this time was in the left lower  quadrant and with minimal pullback of the scope, then in the right lower  quadrant.   The quality of the prep was excellent and it is felt that all areas were  well seen.   Of note, there was absolutely no blood in the colonic lumen whatsoever.  Moreover, none of the diverticular orifices had  inspissated blood.   The mucosa was normal and was specifically free of any evidence of colitis  or vascular ectasia.   No polyps or masses were seen.   Retroflexion was not performed in the rectum due to a small rectal ampulla.   No biopsies were obtained.  The patient tolerated the procedure well and  there were no apparent complications.   IMPRESSION:  1. Recent hematochezia without source evident on current examination,      unless it was arising from her diverticulosis.  Clinically, it does not      sound as though she had a diverticular bleed, however.  2. Moderately severe, but localized, diverticular disease in the sigmoid      region.   PLAN:  1. Okay for anticoagulation.  2. Consider repeat colonoscopy in 10 years if the patient's medical health      remains reasonably good in the interim.           ______________________________  Bernette Redbird, M.D.     RB/MEDQ  D:  05/02/2006  T:  05/03/2006  Job:  [id number]   cc:   Loreen Freud, M.D.  [name], MD

## 2011-01-08 NOTE — Op Note (Signed)
NAMEAMORE, ACKMAN                 ACCOUNT NO.:  [id number]   MEDICAL RECORD NO.:  [id number]          PATIENT TYPE:  INP   LOCATION:  3729                         FACILITY:  [facility]   PHYSICIAN:  [name], MDDATE OF BIRTH:  Sep 28, 1934   DATE OF PROCEDURE:  05/05/2006  DATE OF DISCHARGE:                                 OPERATIVE REPORT   PATIENT IDENTIFICATION:  [name] is a very pleasant 76 year old woman who  was admitted with atrial flutter. She is pending flutter ablation. She was  brought in for a TEE and direct current cardioversion.   INDICATIONS:  Atrial flutter.   SEDATION:  Per anesthesia, she was sedated with sodium pentothal 225 mg x1.   DESCRIPTION OF PROCEDURE:  Prior to cardioversion, the patient underwent  TEE. This showed normal LV function with no evidence of left atrial  appendage thrombus. Heparin level was therapeutic at 0.66. After appropriate  sedation, she underwent cardioversion with a single 120-joule biphasic  synchronized shock with prompt reversion to normal sinus rhythm in the 60s.  There were no apparent complications. EKG is pending. She will be monitored  in the recovery room and then returned to her room.      [name], MD  Electronically Signed     DRB/MEDQ  D:  05/05/2006  T:  05/05/2006  Job:  [id number]

## 2011-01-08 NOTE — Discharge Summary (Signed)
[name], [name]                 ACCOUNT NO.:  [id number]   MEDICAL RECORD NO.:  [id number]          PATIENT TYPE:  INP   LOCATION:  3715                         FACILITY:  [facility]   PHYSICIAN:  [name], MDDATE OF BIRTH:  1934/09/02   DATE OF ADMISSION:  05/24/2006  DATE OF DISCHARGE:  05/31/2006                                 DISCHARGE SUMMARY   PRIMARY CARDIOLOGIST:  Dr. Gala Romney.   PRIMARY CARE PHYSICIAN:  Dr. Roxy Manns.   GASTROINTESTINAL:  Dr. Matthias Hughs.   PRINCIPAL DIAGNOSIS:  Nausea, vomiting, and hypotension.   SECONDARY DIAGNOSES:  1. 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  2. Atrial flutter status post DC cardioversion, September 2007.  3. Recent gastrointestinal bleed requiring transfusion with negative      esophagogastroduodenoscopy in September of 2007.  4. Idiopathic eosinophilia.  5. Nonobstructive coronary artery disease.  6. Hypertension.  7. Hyperlipidemia.  8. History of hypokalemia.  9. Chronic dyspnea secondary to asthmatic bronchitis and mild      bronchiectasis.   ALLERGIES:  NEOSPORIN AND PEROXIDE.   PROCEDURES:  EGD and 2D echocardiogram.   HISTORY OF PRESENT ILLNESS:  A 76 year old white female with multiple  medical problems as outlined in the principal and secondary diagnoses, was  recently hospitalized and discharged on May 16, 2006, following  management of Tako-Tsubo cardiomyopathy with markedly decreased EF at that  time.  She initially did well following discharge but saw Dr. Gala Romney on  May 24, 2006, with a 2-day history of nausea and vomiting as well as  anorexia.  Decision was made to admit her for further evaluation.   HOSPITAL COURSE:  Following admission, she was felt to be volume depleted  and was initiated on IV fluids.  She was also hypotensive, requiring  dopamine therapy for approximately 2 days for pressure support.  She  continued to have complaints of  nausea and vomiting, was unable to keep any  p.o.'s down and GI was consulted, and they reviewed her testing from  September and decided to pursue repeat EGD once her INR was sub therapeutic.  EGD was finally performed on May 26, 2006, revealing exudate in the  distal esophagus that was questionable for yeast, and she was subsequently  placed on Diflucan therapy.  A culture-smear was sent of the exudate and  returned negative for Candida.  She was maintained on Nexium at 40 b.i.d.  with some improvement in the discomfort and her ability to keep p.o.'s down.  She complained of shortness of breath in the absence of heart failure and a  2D echocardiogram was performed and revealed a normalized LVEF of 65%.  She  was seen by pulmonology secondary to a history of chronic dyspnea with  asthma and bronchiectasis.  Initially she was treated with Spiriva, Foradil,  and Xopenex, and steroids were held as it was believed that she had yeast in  her GI tract.  However, once culture-smear came back negative for yeast,  pulmonary recommended initiation of Qvar on discharge.  [name] will be  discharged  home today in satisfactory condition.  Her INR was  supratherapeutic today at 4.1, and therefore will hold her Coumadin for the  next three evenings and have her resume her Coumadin on Friday.  She has  Coumadin clinic followup next Monday.   DISCHARGE LABORATORIES:  Hemoglobin 11.1, hematocrit 32, WBC 9.1, platelets  331, MCV 96.8, neutrophils 43.  Sodium 138, potassium 4.2, chloride 104, CO2  27, BUN 6, creatinine 0.8, glucose 94.  PT 19, INR 1.5, total bilirubin 0.4,  alkaline phosphatase 63, AST 23, ALT 23, amylase 52, lipase 30, albumin 3.1.  CK 27, MB 2.3, troponin I 0.06, calcium 8.9.  BNP 1407.  Digoxin level was  1.4.   DISPOSITION:  Patient is being discharged home today in good condition.   FOLLOWUP PLAN AND APPOINTMENT:  She has a followup in [facility]  Coumadin clinic on  October 15th at 2:45 p.m.  She also has followup with Dr.  Gala Romney on October 15th at 10 a.m.  She is asked to follow up with Dr.  Milinda Antis in one to two weeks and with Dr. Matthias Hughs in one to two weeks.   DISCHARGE MEDICATIONS:  1. Aspirin 81 mg daily.  2. Coumadin as previously prescribed, to be resumed Friday, October 12.  3. Reglan 10 mg q.i.d. with meals and at bedtime.  4. Nexium 40 mg b.i.d.  5. Lipitor 10 mg daily.  6. Coreg 3.125 mg b.i.d.  7. Candesartan 4 mg daily.  8. Spiriva 18 mcg daily.  9. Lasix 20 mg daily.  10.Potassium chloride 20 mEq daily.  11.Qvar with spacer b.i.d.  12.Trazodone 200 mg nightly p.r.n.  13.Xanax 0.25 mg q.4h. p.r.n.  14.Phenergan 25 mg p.r.n.   OUTSTANDING LAB STUDIES:  None.   DURATION OF DISCHARGE ENCOUNTER:  60 minutes including physician time.     ______________________________  Nicolasa Ducking, ANP      [name], MD  Electronically Signed    CB/MEDQ  D:  05/31/2006  T:  06/01/2006  Job:  [id number]   cc:   Marne A. Milinda Antis, MD  Bernette Redbird, M.D.

## 2011-01-08 NOTE — Discharge Summary (Signed)
[name], [name]                 ACCOUNT NO.:  [id number]   MEDICAL RECORD NO.:  [id number]          PATIENT TYPE:  INP   LOCATION:  3715                         FACILITY:  [facility]   PHYSICIAN:  Nicolasa Ducking, ANP DATE OF BIRTH:  May 09, 1935   DATE OF ADMISSION:  05/24/2006  DATE OF DISCHARGE:  05/31/2006                                 DISCHARGE SUMMARY   ADDENDUM:  We have advised [name] to hold her Coumadin this evening and  tomorrow evening and to resume her Coumadin on the evening of Thursday,  June 02, 2006, not Friday the 12th.  She will follow up with Coumadin  clinic on Monday.           ______________________________  Nicolasa Ducking, ANP     CB/MEDQ  D:  05/31/2006  T:  06/01/2006  Job:  [id number]

## 2011-01-08 NOTE — Op Note (Signed)
[name], [name]                 ACCOUNT NO.:  [id number]   MEDICAL RECORD NO.:  [id number]          PATIENT TYPE:  AMB   LOCATION:  ENDO                         FACILITY:  [facility]   PHYSICIAN:  Bernette Redbird, M.D.   DATE OF BIRTH:  27-Nov-1934   DATE OF PROCEDURE:  07/21/2004  DATE OF DISCHARGE:                                 OPERATIVE REPORT   PROCEDURE:  Upper endoscopy with biopsies.   INDICATION:  A 76 year old female with persistent food intolerance, nausea,  epigastric discomfort, and dyspeptic symptomatology.  She has been on  occasional Celebrex.   FINDINGS:  Minimal hemorrhagic gastritis.   PROCEDURE:  The nature, purpose, and risks of the procedure were familiar to  the patient from prior examination.  She provided written consent.  Sedation  was fentanyl 50 mcg and Versed 6 mg IV without arrhythmias or desaturation.  The Olympus video endoscope was passed under direct vision, entering the  esophagus without difficulty.  Vocal cords were briefly seen and appeared  grossly normal.   The esophagus was normal.  No reflux esophagitis, Barrett's esophagus,  varices, infection, or neoplasia were observed.  No ring stricture or  significant hiatal hernia was appreciated.   The stomach contained no residual to speak of. There were a few adherent  coffee ground flecks in the mid body of the stomach consistent with minimal  hemorrhagic gastritis and perhaps a little bit of gastric erythema but  really nothing I consider abnormal.  No erosions, ulcers, polyps, or masses  were seen and a retroflexed view of the cardia was unremarkable.  The  pylorus, duodenal bulb, and second duodenum similarly looked normal.   Antral biopsies were obtained to help rule out H. Pylori (biopsies were  negative a year ago but apparently recent blood test was positive, perhaps  just a persisting antibody).  The scope was then removed from the patient,  who tolerated the procedure well without  apparent complication.   IMPRESSION:  Essentially normal upper endoscopy without source of food  intolerance or dyspeptic symptoms endoscopically evident.   PLAN:  Await pathology results.     RB/MEDQ  D:  07/21/2004  T:  07/21/2004  Job:  [id number]   cc:   Danice Goltz, M.D. [facility]   Loreen Freud, M.D.

## 2011-01-08 NOTE — Op Note (Signed)
[name], [name]                 ACCOUNT NO.:  [id number]   MEDICAL RECORD NO.:  [id number]          PATIENT TYPE:  OIB   LOCATION:  2899                         FACILITY:  [facility]   PHYSICIAN:  Duke Salvia, MD, FACCDATE OF BIRTH:  March 01, 1935   DATE OF PROCEDURE:  10/19/2006  DATE OF DISCHARGE:                               OPERATIVE REPORT   PREOPERATIVE DIAGNOSIS:  Atrial flutter.   POSTOPERATIVE DIAGNOSES:  1. Atrial flutter.  2. Profound sinus node dysfunction with prolonged AV Wenckebach cycle      length.   PROCEDURES:  1. Invasive electrophysiological study.  2. Arrhythmia mapping.  3. Radiofrequency catheter ablation.   Following the obtaining of informed consent, the patient was brought to  the electrophysiology laboratory and placed on the fluoroscopic table in  supine position.  After routine prep and drape, catheterization was  performed with local anesthesia and conscious sedation.  Noninvasive  blood pressure monitoring and transcutaneous oxygen saturation  monitoring were performed continuously throughout the procedure.  Following the procedure, the catheters were removed, hemostasis was  obtained, and the patient was transferred to the holding area in stable  condition.   Catheters:  A 5-French quadripolar catheter was inserted via left  femoral vein to the AV junction to measure the His electrogram.  A 6-  French octapolar catheter was inserted via the right femoral vein to the  coronary sinus.  A 7-French duodecapolar catheter was inserted via the  left femoral vein to the tricuspid annulus.  An 8-French 8-mm  deflectable-tip ablation catheter was inserted via an SL2 sheath via the  right femoral vein to mapping sites in the posterior septal space.  This  catheter and the sheath were subsequently replaced by an SAFL sheath and  a 5 mm medium curved ablation tip catheter.   Surface leads I, aVF and V1 were monitored continuously throughout the  procedure.  Following insertion of the catheters, the stimulation  protocol included:   Incremental atrial pacing.  Incremental ventricular pacing.  Single atrial extra stimuli at paced cycle length of 700 msec.   RESULTS:  Surface electrocardiogram:   Initial:  Rhythm:  Sinus.  RR interval:  1185 msec.  PR interval:  276 msec.  QRS duration:  60 msec.  QT interval:  490 msec.  P wave duration:  140 msec.  Bundle branch block:  Absent.  Pre-excitation:  Absent.  Frequent sinus node arrest was seen with junctional escape.   Final:  Rhythm:  Sinus.  RR interval:  1005 msec.  PR interval:  231 msec.  QRS duration:  90 msec.  QT interval:  473 msec.  P wave duration:  131 msec.  Bundle branch block:  Absent.  Pre-excitation:  Absent.   AH interval:  166 msec; HV interval:  44 msec.   AV nodal function:  Antegrade AV Wenckebach was seen at 600 msec  intermittently.   VA dissociation was noted at 600 msec.   AV nodal conduction was continuous.   Accessory pathway function:  No evidence of an accessory pathway was  identified.   Arrhythmias  induced:  Slow atrial flutter was induced with  counterclockwise rotation.  The cycle length was about 330 msec.   Radiofrequency energy:  A total of 38 minutes 53 seconds of radio wave  energy was applied across the tricuspid annulus.  This was applied at 5,  6 and 7 o'clock across the tricuspid annulus.  Ultimately I had to  switch out the 8 mm for a 5 mm needle-tip catheter because there was a  high-voltage area that I just could not find my way through.  Ultimately  we got conduction block on a line at 5 o'clock on the tricuspid annulus  just below the coronary sinus.   Fluoroscopy time:  A total of 21 minutes 1 second of fluoroscopy time  was utilized at 7-1/2 frames per second.   IMPRESSION:  1. Abnormal sinus function noted by sinus node arrest with junctional      escape.  2. Abnormal atrial function manifested by  sustained atrial flutter.  3. Prolonged AV nodal Wenckebach cycle length.  4. Normal His-Purkinje system function.  5. No accessory pathway.  6. Normal ventricular response to programmed stimulation.   SUMMARY AND CONCLUSION:  The results electrophysiological testing  confirmed cavotricuspid isthmus conduction and cavotricuspid isthmus-  dependent flutter as a likely mechanism underlying the patient's  clinical arrhythmia.  Radiofrequency energy delivered along the  cavotricuspid isthmus interrupted bidirectional conduction and thereby  the substrate for the patient's arrhythmia.   It was also noted that the patient had profound sinus node dysfunction.  The patient entered the lab on no AV nodal blocking agents with a sinus  rate in the mid 30s and a junctional rate about the same with sinus node  slowing.   Because of that, it was recommended following the ablation procedure to  undergo dual-chamber pacemaker implantation.      Duke Salvia, MD, [facility]  Electronically Signed     SCK/MEDQ  D:  10/19/2006  T:  10/19/2006  Job:  [id number]   cc:   Electrophysiology Laboratory  [facility]

## 2011-01-08 NOTE — Cardiovascular Report (Signed)
[name], [name]                 ACCOUNT NO.:  [id number]   MEDICAL RECORD NO.:  [id number]          PATIENT TYPE:  INP   LOCATION:  6533                         FACILITY:  [facility]   PHYSICIAN:  Veverly Fells. Excell Seltzer, MD  DATE OF BIRTH:  March 12, 1935   DATE OF PROCEDURE:  05/10/2006  DATE OF DISCHARGE:                              CARDIAC CATHETERIZATION   PROCEDURE:  Left heart catheterization, selective coronary angiography, left  ventricular angiography, StarClose vascular closure of the common femoral  artery.   INDICATIONS:  [name] is a very pleasant 76 year old woman who initially  presented earlier this month with GI bleeding and atrial fibrillation.  She  underwent elective cardioversion and has been anticoagulated since that  time.  She presented once again two days ago with a non-ST elevation  myocardial infarction.  Her presentation was chest pressure and shortness of  breath.  She had a mild rise in her cardiac biomarkers, but she had a marked  LV wall motion abnormality seen on echocardiography that involved the entire  anterior wall and apex.  She was subsequently referred for cardiac  catheterization.   ACCESS:  Right femoral artery with a 4 French sheath.   COMPLICATIONS:  None.   PROCEDURAL DETAILS:  Risks and indications of the procedure were discussed  in detail with Ms. Orzechowski and her family.  They fully understood, and informed  consent was obtained.  Under normal sterile conditions, the right groin was  prepped, draped, and anesthetized with 1% lidocaine.  Using a modified  Seldinger technique, a 4 French arterial sheath was placed in the right  common femoral artery.  Multiple angiographic views were taken of both the  left and right coronary arteries.  The left coronary artery was imaged with  a 4 French JLR catheter.  The right coronary artery was imaged with a 4  [place] JR4 catheter.   Following selective coronary angiography, a pigtail catheter was  inserted  into the left ventricle.  Left ventricular pressure was recorded.  A 30  degree RAO ventriculogram was done.  Following ventriculography, a pull-back  was performed across the aortic valve.  At the conclusion of the case, a  femoral angiogram demonstrated a normal common femoral artery with vessel  access within the common femoral.  A 6 French StarClose device was used for  hemostasis.  Heparin was continued throughout the case due to the patient's  cardioversion and risk of thromboembolic consequences.   FINDINGS:  Aortic pressure 114/78 with a mean of 95.  Left ventricular  pressure 107/17 with an end diastolic pressure of 24.   CORONARY ANGIOGRAPHY:  Left mainstem is short.  It is calcified and  angiographically is otherwise normal.  It bifurcates into the LAD and left  circumflex.   The LAD is calcified throughout its proximal portion.  There is  nonobstructive plaque in the very proximal portion of the LAD.  The LAD  gives off three medium caliber diagonal vessels.  There is a calcified  lesion in the mid LAD, just beyond the second diagonal that is 50% stenosed  angiographically.  There is heavy calcification in this region.  The  remainder of the LAD is free of any angiographic disease.  The diagonals are  free of any significant angiographic disease.   The left circumflex courses down through the atrioventricular groove and  gives off on the large first obtuse marginal branch.  It then continues to  follow this course and give off two posterolateral branches.  It also  supplies a portion of the inferoposterior wall.  The left circumflex is  angiographically normal.   The right coronary artery is fairly small diameter.  It is codominant with  the left circumflex.  There is nonobstructive plaque in the proximal  portion.  In the mid portion of the right coronary artery, there is a 60%  lesion that is focal.  The right coronary artery gives off a significant   posterolateral branch distally and supplies some septal perforators.   The left ventriculogram was performed in a 30 degree RAO projection.  The  bases were hyperdynamic, and there was a marked wall motion abnormality with  akinesis of the anterolateral, apical, and inferoapical walls.  The left  ventricular angiogram is classic for Takotsubo cardiomyopathy.   ASSESSMENT:  1. Probable Takotsubo cardiomyopathy.  2. Moderate double vessel coronary artery disease in the left anterior      descending and right coronary arteries.  3. Normal left circumflex.  4. Markedly abnormal left ventricular function, consistent with transient      apical ballooning.   DISCUSSION:  The patient does have coronary artery disease; however, her  left ventricular wall motion is greatly out of proportion to the degree of  coronary stenosis present.  The LAD does not wrap around the apex and would  not supply such a large territory, as seen in the left ventriculogram.  I  suspect that her coronary stenoses are incidental findings and that the true  etiology of her syndrome is related to Takotsubo cardiomyopathy.  This would  be consistent with a small enzyme rise, chest pain syndrome, and the  ventricular wall motion seen on this study.  I recommend medical therapy  with beta blockers as first line treatment.  The patient is already on  metoprolol.  Agree with continuation of an ACE inhibitor as well the patient  is on lisinopril.  She should have a follow-up echocardiogram in 4-6 weeks,  as we would expect good recovery of her left ventricular function.      Veverly Fells. Excell Seltzer, MD  Electronically Signed     MDC/MEDQ  D:  05/10/2006  T:  05/11/2006  Job:  [id number]

## 2011-01-08 NOTE — Discharge Summary (Signed)
[name], [name]                 ACCOUNT NO.:  [id number]   MEDICAL RECORD NO.:  [id number]          PATIENT TYPE:  OIB   LOCATION:  3735                         FACILITY:  [facility]   PHYSICIAN:  Duke Salvia, MD, FACCDATE OF BIRTH:  04-20-1935   DATE OF ADMISSION:  10/19/2006  DATE OF DISCHARGE:  10/20/2006                               DISCHARGE SUMMARY   SHE HAS ALLERGIES TO NEOSPORIN AND PEROXIDE.   This dictation greater than 35 minutes.   PRINCIPAL DIAGNOSES:  1. Discharging day one status post electrophysiology      study/radiofrequency catheter ablation of atypical atrial flutter      with creation of Cavotricuspid isthmus block.  2. Sinus node dysfunction with bradycardia.  Patient is symptomatic      and has fatigue, profound bradycardia after atrial flutter      ablation.  Patient had implant of Medtronic ADAPTA dual-chamber      pacemaker by Dr. Sherryl Manges October 19, 2006.   SECONDARY DIAGNOSES:  1. Non-ST-elevation myocardial infarction in September of 2007.        A:  Left heart catheterization on May 10, 2006:  Nonobstructive coronary artery disease.  The LAD had a 50% stenosis  after the second diagonals; the left circumflex was free of significant  disease; the mid-right coronary artery had a 60% stenosis.        B:  Takotsubo morphology affecting the anterior and apical walls.        C:  Ejection fraction 25% on catheterization in September of 2007.        D:  Echocardiogram in December of 2007:  Ejection fraction 55 to  65%, no left ventricular wall motion abnormalities.  1. Hypertension.  2. Dyslipidemia.  3. History of GI bleed.  4. Asthmatic bronchitis.  5. Anxiety.   PROCEDURES:  October 19, 2006:  Electrophysiology study and  radiofrequency catheter ablation of atypical atrial flutter by Dr.  Sherryl Manges followed by implantation of Medtronic ADAPTA dual-chamber  pacemaker for severe bradycardia post creation of Cavotricuspid isthmus  block.   BRIEF HISTORY:  [name] is a 76 year old female.  She was seen at [facility] for atrial flutter in September.  Subsequently, she  developed Takotsubo syndrome with a decreased ejection fraction of about  25% by catheterization in September of 2007.  By December of last year,  she had recovered her cardiac function and ejection fraction was found  to be 55 to 65% with no left ventricular wall motion abnormalities by  echocardiogram.  The patient has been fatigued, however, and atrial  flutter is considered to be a contributing factor.  The patient  discussed atrial flutter catheter ablation with Dr. Graciela Husbands in the past  and was waiting for a teeth cleaning procedure to proceed.  She also  would like to come off Coumadin and this could be possible after a  successful ablation.  Patient will present electively for this  procedure.   HOSPITAL COURSE:  Patient presented electively to [facility] on  February 27.  She underwent successful radiofrequency catheter  ablation  of atypical atrial flutter by Dr. Hurman Horn.  She was found to have  sinus node dysfunction after the procedure was successfully completed  with profound bradycardia with heart rate in the 30s.  This necessitated  implantation of a pacemaker, a Medtronic ADAPTA was implanted, a dual-  chamber pacemaker.  The patient is successfully in sinus rhythm pacing  at a rate of 60 at the time of discharge.  Her incision is without  hematoma, but she is a little sore and will go home on Darvocet.  With  pacing and allowing for a normal AV node conduction, her P-R interval is  400 msec.   With consideration of a long P-R interval, this patient is ideally  suited to a study that Dr. Graciela Husbands is considering which would consist of  cardiopulmonary exercise study with normal A2 intrinsic V conduction  comparing that with exercise capability when the P-R interval is  decreased by means of adjusting the P-R  interval on the pacemaker such  that she is then AV paced at various intervals.  This will be considered  in the near future when she sees Dr. Graciela Husbands in followup.  The patient has  had a chest x-ray postprocedure day number one which shows the leads are  in appropriate position and no pneumothorax.  Patient's pacemaker has  been interrogated prior to discharge, all values within normal limits.  She discharges on the following medications:   1. Multivitamin daily.  2. Coumadin 3 mg Monday and Friday, 2 mg all other days.  3. Lasix 20 mg daily.  4. Spiriva 18 mcg per inhalation daily.  5. Rhinocort taken daily.  6. Lipitor 10 mg daily at bedtime.  7. Qvar twice daily.  8. Nexium 40 mg twice daily.  9. Lexapro 10 mg daily.  10.Trazodone 100 mg daily at bedtime.  11.Enteric-coated aspirin 81 mg daily.  12.Reglan 10 mg before meals three times daily.  13.Candesartan 4 mg daily.  14.Potassium chloride 20 mEq daily.  15.New medication is Darvocet-N 100 one to two tabs every four to six      hours as needed for pain.   FOLLOWUP:  1. She has followup at [facility], [address],      Coumadin clinic Thursday, March 6, at 11:30.  2. Incision check Monday, March 24, at 9:40.  3. She will see Dr. Graciela Husbands Monday, March 31, at 1:45 p.m., at that time      he may set up exercise study with AV delay modifications.  4. She will see Dr. Graciela Husbands, Tuesday, May 27, at 9:20.   She is reminded to keep her incision dry for the next seven days, sponge  bathe until Wednesday, March 5.   LABS PERTINENT TO THIS ADMISSION:  White cells are 6.6, hemoglobin 11.2,  hematocrit of 32.3 and platelets are 260, protime was 23.1, INR 3.2,  patient has been holding her Coumadin for several days and restarting it  just prior to this admission, sodium is 131, potassium 4.3, chloride 97,  bicarbonate 25, glucose 111, BUN is 15, creatinine 0.7.     Maple Mirza, [place]      Duke Salvia, MD,  [facility]  Electronically Signed    GM/MEDQ  D:  10/20/2006  T:  10/20/2006  Job:  [id number]   cc:   Lelon Perla, DO  Bernette Redbird, M.D.

## 2011-01-08 NOTE — Assessment & Plan Note (Signed)
[facility]                            CARDIOLOGY OFFICE NOTE   NAME:[name], [name] HAUTALA                        MRN:          [id number]  DATE:08/17/2006                            DOB:          1935-03-13    PRIMARY CARE PHYSICIAN:  Loreen [name], M.D.   GASTROENTEROLOGIST:  [name], M.D.   [name] is a very pleasant 76 year old woman, who returns for routine  followup.   PROBLEM LIST:  1. Congestive heart failure, secondary to Takotsubo cardiomyopathy in      September 2007 with EF of 25%.  Most recent echocardiogram shows an      EF of 55-65% with mild mitral regurgitation and trivial aortic      insufficiency.  2. Atrial flutter, status post cardioversion.  3. Recent GI bleed with negative EGD, September 2007.  4. Idiopathic eosinophilia.  5. Nonobstructive coronary artery disease.  6. Hypertension.  7. Hyperlipidemia.  8. Asthmatic bronchitis.  9. Anxiety.   CURRENT MEDICATIONS:  1. Spiriva.  2. Trazodone 200 mg q.h.s.  3. Rhinocort.  4. Multivitamin.  5. Coumadin.  6. Lisinopril 20 a day.  7. Digoxin 0.125 mg a day.  8. Lasix 20 a day.  9. Lipitor 10 a day.  10.Lexapro 10 a day.   MEDICATION INTOLERANCES/ALLERGIES:  Intolerant of beta blockers due to  severe bradycardia.   INTERVAL HISTORY:  [name] returns today with her husband.  She is  doing quite well.  She denies any chest pain nor heart failure symptoms.  She does have occasional shortness of breath due to her asthma.  No  lower extremity edema, no PND, no palpitations.  She says her energy  level is much improved.  She has been tolerating Coumadin without any  bleeding.   PHYSICAL EXAMINATION:  She is well-appearing, ambulatory in the clinic  without any respiratory difficulty.  Blood pressure is 124/62, heart  rate 60.  HEENT:  Sclerae anicteric.  EOMI.  There are no xanthelasmas.  Mucous  membranes are moist.  NECK:  The neck is supple, no JVD.  Carotids are  2+ bilaterally without  any bruits.  There is no lymphadenopathy or thyromegaly.  CARDIAC:  Regular rate and rhythm with a soft systolic murmur at the  apex.  There is no gallop.  LUNGS:  Clear, no wheezes or rales.  ABDOMEN:  Soft, nontender, nondistended.  No hepatosplenomegaly, no  bruits, no masses, good bowel sounds.  EXTREMITIES:  Warm with no cyanosis, clubbing or edema.  Distal pulses  are strong.  There are no rashes.  NEUROLOGIC:  She is alert and oriented times three.  Cranial nerves II  through XII are intact.  Moves all four extremities without difficulty.  Affect is appropriate.   The EKG shows sinus rhythm with sinus arrhythmia and first degree AV  block.  There is a rate of 60.  No significant STT-wave abnormalities.   ASSESSMENT AND PLAN:  1. Congestive heart failure, secondary non-ischemic cardiomyopathy.      This has resolved.  She is on a good medical regimen.  Unfortunately, she is unable to tolerate beta blocker.  2. Atrial flutter.  She is maintaining sinus rhythm nicely.  We will      have her see Dr. Graciela [name] in EP for consideration of A-flutter      ablation, as she was fairly symptomatic previously.  3. Hypertension, well-controlled.   DISPOSITION:  We will see her back in clinic in six months.  Of note,  she is complaining of some hair-loss and we will check a thyroid panel  to make sure that her profile is normal.  Otherwise, she will follow up  with [name].     [name] Buckles. Bensimhon, MD  Electronically Signed    DRB/MedQ  DD: 08/17/2006  DT: 08/17/2006  Job #: [id number]   cc:   Loreen [name], M.D.  [name], M.D.

## 2011-01-08 NOTE — Discharge Summary (Signed)
[name], [name]                 ACCOUNT NO.:  [id number]   MEDICAL RECORD NO.:  [id number]          PATIENT TYPE:  INP   LOCATION:  3729                         FACILITY:  [facility]   PHYSICIAN:  [name], MDDATE OF BIRTH:  Sep 24, 1934   DATE OF ADMISSION:  04/29/2006  DATE OF DISCHARGE:                           DISCHARGE SUMMARY - REFERRING   DISCHARGE DIAGNOSIS:  1. Gastrointestinal bleed.  2. Elevated INR.  3. Anemia secondary to number 1 requiring transfusion of 1 unit packed red      blood cells status post colonoscopy.  4. Atrial flutter status post DC cardioversion restoring normal sinus      rhythm.  5. Hypertension.  6. Hypokalemia.  7. History as noted.   PROCEDURES PERFORMED:  Status post endoscopy on May 01, 2006,  colonoscopy on May 02, 2006, TEE cardioversion on May 05, 2006,  restoring sinus rhythm.   BRIEF HISTORY:  [name] is a 76 year old female who was referred to Dr.  Gala Romney for atrial flutter of unknown duration on August 20. At that time,  she was placed on Coumadin with anticipation of potential ablation to limit  her Coumadin exposure. However, the patient has not followed up with the  Coumadin clinic and she began having weakness and black stools.  Labs were  checked and her INR was 9.4.   PAST MEDICAL HISTORY:  Also notable for hypertension, chronic dyspnea  secondary to asthmatic bronchitis and mild bronchiectasis followed by Dr.  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   ALLERGIES:  Include NEOSPORIN AND PEROXIDE.   LABORATORY:  Admission H&H on the 7th was 10.7 and 31.1, normal indices,  platelets 235, WBCs 7.1. Prior to discharge, H&H was 10.6 and 31.1, normal  indices, platelets 200, WBCs 7.  PTT was 57, PT was 48.7, and INR 4.8. By  the 9th, INR was 1.6 and on the 10th, it was 1.3, on the 12th, 1.2,  13th  1.5, and on 14th, 2.8.  Admission sodium was 127, potassium 3.7, BUN 7,  creatinine 0.7, normal LFTs, glucose 94. Subsequent chemistries were notable  for continued hypokalemia to a low at 3.2 on the 13th.  On the 14th, prior  to discharge, sodium was 133, potassium 4.8, BUN 5, creatinine 0.5, glucose  103.  EKGs showed atrial flutter with 4:1 conduction and ventricular rate  53, nonspecific ST-T wave changes.   HOSPITAL COURSE:  [name] was admitted to [facility]. Consultation  was sought with GI in regards to her probable GI bleed. She was continued on  her home medications.  Her Coumadin was held and she received vitamin K. On  the 8th, Dr. Eden Emms transfused 1 unit packed RBCs. GI performed EGD on the  9th and this revealed esophageal Candidiasis.  Colonoscopy was performed on  the 10th and was normal except for TI and significant tics.  There was no  bleeding source noted.  It was felt that she could resume anticoagulation  and follow up with a colonoscopy  in ten years for screening.  Her Coumadin  was resumed on the 11th at which time she received 2.5 mg.  She received 4  mg on the 13th with a significant increase of her INR. Heparin was resumed.  It was felt that she would need cross coverage of heparin or Lovenox until  her INR was therapeutic, however, on the 14th INR was 2.8.  Progression  nursing over her last several days of admission assisted with discharge  needs.  Pharmacy assisted with Coumadin dosing. [name]  TEE cardioversion for September 13 and this was performed without  difficulty, restoring sinus rhythm. Throughout her stay, her potassium was  supplemented. It was noted that on the 13th she received a total of 120 mEq  of potassium which increased her potassium from 3.2 to 4.8.   DISCHARGE MEDICATIONS:  [name] new medications include Coumadin 2 mg  daily, however, she was instructed not to take any Coumadin today on   September, 14, 2007, but to begin on Saturday.  She will have a PT/INR on  Monday at Dr. Prescott Gum office at 3:15. She will also have blood work with  a CBC and BMET to follow-up on her anemia and her hypokalemia.  Other new  medications include lisinopril 10 mg daily for hypertension and potassium 20  mEq daily.  She was asked to continue her other medications which include  albuterol as needed, Klevac 400 mg daily, Spiriva 18 mcg daily, Advair  250/50 b.i.d., Metclocycline 150 b.i.d., trazodone 200 q.h.s., Nexium 40  b.i.d., multivitamin daily, metoprolol 50 mg b.i.d., Clarinex daily, and  Mega C daily.   DISCHARGE INSTRUCTIONS:  She will follow up with Dr. Graciela Husbands for possible  atrial flutter ablation on May 19, 2006, at 3:45. She will follow up  with Dr. Gala Romney on July 07, 2006, at 10:00 a.m.[name]   Discharge time greater than 30 minutes.     ______________________________  Joellyn Rued, PA-C      [name], MD  Electronically Signed    EW/MEDQ  D:  05/06/2006  T:  05/06/2006  Job:  [id number]   cc:   Bernette Redbird, M.D.  Duke Salvia, MD, [facility]  Gailen Shelter, MD

## 2011-01-08 NOTE — Assessment & Plan Note (Signed)
[facility]                         ELECTROPHYSIOLOGY OFFICE NOTE   NAME:Dangler, GRETNA BERGIN                        MRN:          [id number]  DATE:11/21/2006                            DOB:          [birth year]    Ms. Quesada is seen, she is status post flutter ablation and pacemaker  implantation for significant and profound sinus bradycardia. She has  been doing better from a breathing point of view. She is having less  fatigue but she is having problems with orthostatic light-headedness.  Her blood pressure, which has previously been hypertensive in the past,  is now low. Her only antihypertensive right now is Atacand at 4 mg a  day.   PHYSICAL EXAMINATION:  VITAL SIGNS:  Her blood pressure is 90/60, pulse  is 85.  LUNGS:  Clear.  HEART:  Sounds were regular.  EXTREMITIES:  Without edema.   The pacemaker site was well-healed.   Interrogation of her pacemaker demonstrates that she is atrial paced  about 82% of the time.   IMPRESSION:  1. Hypotension probably related to medications, question metabolic  2. Atrial flutter status post ablation.  3. Sinus node dysfunction status post pacer.   Ms. Petrakis rhythm issues are better. Her light-headedness and  hypotension is concerning, especially as there has been a gradual down  titration of her medications. We will plan to discontinue her Atacand  today and we will check a cortisol level.   We will see her again in 8 week's time to make a decision about Coumadin  at that time.     Duke Salvia, MD, [facility]  Electronically Signed    SCK/MedQ  DD: 11/21/2006  DT: 11/21/2006  Job #: [id number]   cc:   Lelon Perla, DO  Bernette Redbird, M.D.  Gailen Shelter, MD

## 2011-01-08 NOTE — Discharge Summary (Signed)
NAMELUVADA, SALAMONE                 ACCOUNT NO.:  [id number]   MEDICAL RECORD NO.:  [id number]          PATIENT TYPE:  INP   LOCATION:                               FACILITY:  [facility]   PHYSICIAN:  [name], MDDATE OF BIRTH:  Nov 26, 1934   DATE OF ADMISSION:  05/08/2006  DATE OF DISCHARGE:  05/16/2006                                 DISCHARGE SUMMARY   PRIMARY CARDIOLOGIST:  Dr. Arvilla Meres.   PRIMARY CARE PHYSICIAN:  Dr. Roxy Manns.   PRIMARY ELECTROPHYSIOLOGIST:  Dr. Sherryl Manges.   PRINCIPAL DIAGNOSIS:  Non-ST elevation MI.   SECONDARY DIAGNOSIS:  1. 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  2. Congestive heart failure.  3. Urinary tract infection.  4. Nonobstructive coronary artery disease.  5. Hypertension.  6. Hyperlipidemia.  7. Hypokalemia.  8. History of atrial flutter status post transesophageal echocardiography      and cardioversion May 05, 2006, pending flutter ablation with Dr.      Sherryl Manges.  9. History of idiopathic eosinophilia previously on Gleevec.  10.Chronic dyspnea secondary to asthmatic bronchitis and mild      bronchiectasis.  11.History of recurrent pneumonia.  12.History of peptic ulcer with gastrointestinal bleed due to duodenal      ulcer in 2004 and more recent gastrointestinal bleed secondary to      supratherapeutic INR September of 2007.  13.Hyponatremia.   ALLERGIES:  NEOSPORIN AND PEROXIDE.   PROCEDURES:  Left heart cardiac catheterization and 2D echocardiogram.   HISTORY OF PRESENT ILLNESS:  A 76 year old white female with the above  problem list who was recently discharged from [facility] on  May 06, 2006, following admission for GI bleeding in the setting of a  supratherapeutic INR.  During that admission, she was also noted to be in  atrial flutter and underwent successful TEE and cardioversion.  Echocardiogram during that time showed normal LV function.  Following  discharge, she felt as though she had a fever as well as cough and  progressive shortness of breath and orthopnea with left-sided chest pressure  prompting her to re-present to the [facility] Emergency Room where EKG  showed lateral T wave inversions and cardiac markers were abnormal with a CK-  MB of 9 and a troponin of 0.4.  She was then transferred to [facility] for  further evaluation and management.   HOSPITAL COURSE:  Following admission, she eventually peaked her CK at 297,  her MB at 19.9, and troponin I at 1.92.  Her Coumadin was held in  preparation for cardiac catheterization, and she was covered with a heparin  bridge.  Secondary to history of idiopathic eosinophilia on Gleevec therapy,  we consulted hematology/oncology and they recommended holding Gleevec as it  may potentiate CHF, pulmonary edema, and LV dysfunction.  On September 18th,  Ms. Brosh underwent left heart cardiac catheterization, which showed a 50%  calcified lesion in the proximal LAD as well as a 60% stenosis in the  proximal RCA, but all in all, nonobstructive coronary artery disease.  She  was noted to have  markedly decreased LV function with apical ballooning, and  followup echocardiogram revealed an EF of 25% with severe hypokinesis of the  anterior, periapical, and septal walls.  This presentation with abnormal  cardiac markers in the setting of nonobstructive coronary disease and LV  dysfunction with apical ballooning was felt to be most consistent with Tako-  tsubo's cardiomyopathy, and she was managed with beta blocker and ACE  inhibitor therapy.  Post procedure, she continued to complain of dyspnea and  chest x-ray revealed moderately sized bilateral pleural effusion.  She was  placed on IV Lasix with fairly good diuresis, however, slow improvement in  dyspnea.  Secondary to this, we also added spironolactone and this was  titrated to 25 mg daily.  With continued IV diuresis, she symptomatically   improved and was switched over to oral therapy.  She also had improved  aeration on exam with decrease in her pleural effusions.  She was  transferred from step down to the floor on September 21st and has been  ambulating with much improvement.  Her Coumadin was resumed  postcatheterization and her INR is now therapeutic at 2.1.  She will be  discharged on Coumadin 2 mg daily secondary to concomitant use of Cipro for  the management of UTI found today.  Pharmacy has recommended Coumadin 2.5 mg  daily following Cipro therapy, and we will have her followed up closely in  our Coumadin clinic this Wednesday, September 26th.  Ms. Rosene has been doing  well and is being discharged home today in satisfactory condition.   DISCHARGE LABS:  Hemoglobin 11.7, hematocrit 33.7, WBC 9.0, platelets 316,  PT 24.8, INR 2.1, sodium 131, potassium 3.5, chloride 96, CO2 of 27, BUN 5,  creatinine 0.6, glucose 107, total bilirubin 0.8, alkaline phosphatase 65,  AST 38, ALT 37, total protein 5.5, albuterol 3.1, calcium 9.0, total  cholesterol 118, triglycerides 68, HDL 41, LDL 63, iron 79, TIBC 269,  ferritin 154.   DISPOSITION:  Patient is being discharged home today in good condition.   FOLLOWUP PLANS AND APPOINTMENTS:  Ms. Helderman has arrangements to follow up  with Dr. Arvilla Meres on October 2nd at 9:45 a.m.  She is to follow up  with Dr. Sherryl Manges on September 27th at 3:45 p.m.  She has [place]  [facility] followup on September 26th at 2:30 p.m., and she is asked to  follow up with Dr. Milinda Antis in approximately 3 to 4 weeks.   DISCHARGE MEDICATIONS:  1. Lisinopril 20 mg daily.  2. Coreg 6.25 mg b.i.d.  3. Digoxin 0.125 mg daily.  4. Lasix 20 mg daily.  5. Lipitor 10 mg daily.  6. Aspirin 81 mg daily.  7. Coumadin 2 mg q.p.m.  8. K-Dur 20 mEq b.i.d.  9. Spironolactone 25 mg daily.  10.Cipro 250 mg b.i.d.  11.Nexium 40 mg daily. 12.Spiriva 18 mcg daily.  13.Advair 250/50 one puff b.i.d.   14.Declomycin 150 mg b.i.d.  15.Trazodone 200 mg q.h.s.  16.Clarinex 5 mg daily.  17.Vitamin C daily.  18.Nitroglycerin 0.4 mg sublingual p.r.n. chest pain.   OUTSTANDING LAB STUDIES:  Urine cultures pending.   DURATION OF DISCHARGE ENCOUNTER:  60 minutes, including physician time.     ______________________________  Nicolasa Ducking, ANP      [name], MD  Electronically Signed    CB/MEDQ  D:  05/16/2006  T:  05/16/2006  Job:  [id number]   cc:   Marne A. Milinda Antis, MD

## 2011-01-08 NOTE — Op Note (Signed)
[name], [name]                           ACCOUNT NO.:  [id number]   MEDICAL RECORD NO.:  [id number]                   PATIENT TYPE:  AMB   LOCATION:  ENDO                                 FACILITY:  [facility]   PHYSICIAN:  Bernette Redbird, M.D.                DATE OF BIRTH:  October 02, 1934   DATE OF PROCEDURE:  05/27/2003  DATE OF DISCHARGE:                                 OPERATIVE REPORT   PROCEDURE:  Upper endoscopy with biopsies.   INDICATIONS:  This is a 76 year old female who, approximately three months  ago while vacationing in [place], developed a GI bleed and was endoscoped in  [place] at which time apparently several duodenal ulcers were found.  I  believe she also had a transfusion while she was there.  She has been on  Nexium since that time and also has had therapy for H. pylori infection.  The patient has restarted her Celebrex, aspirin, and Actonel as of about two  months ago.   FINDINGS:  No evidence of residual or recurrent ulceration.   PROCEDURE:  The nature, purpose, risks of the procedure had been discussed  with the patient, who provided consent.   SEDATION:  Fentanyl 60 mcg and Versed 6 mg IV, without arrhythmias or  desaturation.   DESCRIPTION OF PROCEDURE:  The Olympus small-caliber adult videoendoscope  was passed under direct vision.  The vocal cords are not well seen.  The  esophagus was entirely normal, without evidence of reflux esophagitis,  Barrett's esophagus, varices, infection, neoplasia, or any ring stricture,  or hiatal hernia.  The stomach contained some bile but was otherwise normal,  without evidence of gastritis, erosions, ulcers, polyps, or masses,  including retroflexion of the proximal stomach, and the pylorus, duodenal  bulb, and second duodenum looked normal.  Careful inspection of the duodenal  bulb showed no evidence of recurrent or residual ulcerations, and I did not  see any aspirin- or Celebrex-induced gastropathy.   Antral  biopsies were obtained for pathology to check for any evidence of  residual H. pylori infection prior to removal of the scope.   The patient tolerated the procedure well, and there were no apparent  complications.   IMPRESSION:  Resolution of previously noted duodenal ulcer.   No evidence of mucosal injury from current regimen which includes several  ulcerogenic medications.   PLAN:  Await pathology and, if H. pylori is still present, consider  retreatment.                                               Bernette Redbird, M.D.    RB/MEDQ  D:  05/27/2003  T:  05/27/2003  Job:  [id number]   cc:   [name], M.D.  [address] [place]  [place], [place] [address]  Fax: [phone]

## 2011-01-08 NOTE — Consult Note (Signed)
[name], [name]                 ACCOUNT NO.:  [id number]   MEDICAL RECORD NO.:  [id number]          PATIENT TYPE:  INP   LOCATION:  3715                         FACILITY:  [facility]   PHYSICIAN:  [name] Helling, MD        DATE OF BIRTH:  19-Nov-1934   DATE OF CONSULTATION:  05/29/2006  DATE OF DISCHARGE:                                   CONSULTATION   REFERRING PHYSICIAN:  [name], MD   REASON FOR CONSULTATION:  Evaluation of dyspnea.   [name] is a 76 year old female who was admitted on October 2 after being  evaluated in the cardiology office for nausea, vomiting, and hypotension.  She was recently discharged from the hospital on September 24 after being  diagnosed with Takotsubo cardiomyopathy. Apparently, her hospital course  during then was also complicated by a GI bleed.  Again, she was discharged  from the hospital then on September 24 but then she was beginning to develop  symptoms of the nausea and vomiting as well as the dysphagia and anorexia  and had lost approximately 12 pounds.  She was seen by Dr. Gala [name] in his  office where she was noted to be hypotensive and bradycardiac and she was  subsequently admitted to the hospital she had a reevaluation done by  Gastroenterology and had undergone and an upper endoscopy by Dr. Carman [name] on October 5 and was noted to have cheesy exudate in her esophagus  consistent with probable esophageal candidiasis.  As a result, she had all  of her inhaler medications discontinued which she was using for her  asthmatic bronchitis.  She normally sees [name] in the office  for this.  Of note also is  that she had a normal echocardiogram done on  October 3.  Last night, she had symptoms of progressive shortness of breath  associated with oxygen desaturation to 88%.  She was started on supplemental  oxygen as well as given a dose of Lasix with some improvement in her  symptoms.  She has had the chest x-rays done  which have shown bilateral  pleural effusions.  She is also complaining of a cough with the production  of clear sputum as well as some wheezing and chest tightness.  She denies  having any fevers, chills or sweats.  She says that her nausea, vomiting,  and anorexia have  improved to some degree as well.  She also denies having  any leg swelling.  Her past medical history is significant for atrial  flutter status post cardioversion on September 13.  She had an upper GI  bleed.  She has the Takotsubo  cardiomyopathy.  She has idiopathic  eosinophilia which is currently being evaluated by [name].  She has  asthma and bronchiectasis.   Her outpatient medications are:  1. Spiriva 1 puff b.i.d.  2. Advair 250/50 one puff b.i.d.  3. Albuterol is 2 puffs q.i.d. p.r.n.  4. Nexium 40 mg daily.  5. Demeclocycline 150 mg q.12 hours.  6. Trazodone 200 mg nightly.  7. Rhinocort daily.  8. Multivitamin as needed.  9. Coumadin as directed.  10.Lisinopril 20 mg daily.  11.Coreg 6.25 mg b.i.d.  12.Digoxin 0.125 mg daily.  13.Lasix 20 mg daily.  14.Lipitor 10 mg daily.  15.Aspirin 81 mg daily.  16.Potassium 20 mEq b.i.d.  17.Aldactone 25 mg daily.  18.Clarinex.   Her inpatient medications are:  1. Aspirin 81 mg daily.  2. Coreg 3.125 mg b.i.d.  3. Coumadin.  4. Desyrel 200 mg nightly.  5. Multivitamin.  She has been started on:  1. Mycelex Troches.  2. Prinivil 10 mg nightly.  3. Protonix 40 mg b.i.d.  4. Reglan 10 mg before meals.  5. Zocor 20 mg daily.   SHE HAS AN ALLERGY TO NEOSPORIN AND PEROXIDE.   SOCIAL HISTORY:  There is no history of smoking or alcohol use.  She is  married and lives with her husband.   FAMILY HISTORY:  Significant for mother who had asthma and her sister had a  myocardial infarction.   REVIEW OF SYSTEMS:  Essentially negative except for what is stated above.   PHYSICAL EXAMINATION:  GENERAL:  She is seen in her hospital room.  She is  awake, alert  and oriented, and does not appear to be in acute distress.  VITAL SIGNS:  Temperature is 98.2, blood pressure is 130/80, heart rate 65,  oxygen saturation is 95% on room air, respiratory rate is 18.  HEENT:  Pupils are reactive.  Extraocular muscles are intact.  There is no  sinus tenderness.  There are no oral lesions.  There is no lymphadenopathy.  No thyromegaly.  HEART:  S1 and S2.  Regular rate and rhythm.  CHEST:  There are decreased breath sounds with crackles heard at the bases  bilaterally but no wheezing.  ABDOMEN:  Soft, nontender, positive bowel sounds.  EXTREMITIES:  There was no edema, cyanosis or clubbing.  NEUROLOGIC:  She is alert and oriented x3 with 5/5 strength.   Chest x-ray showed bilateral pleural effusions.  Also, recent laboratory  values showed a WBC of 8.1, hemoglobin 11.1, hematocrit 32.5, platelet count  304.  Sodium is 130, potassium is 3.7, chloride is 107, CO2 21, BUN 7,  creatinine 0.6, glucose 85, PT 19, INR 1.5, AST 23, ALT is 23, ALP is 63,  bilirubin 0.4, amylase 52, lipase 30, albumin is 3.1, calcium is 8.2,  digoxin level is 1.4.  BNP was 973 but on admission was 340.   IMPRESSION:  1. Asthmatic bronchitis.  Since she has not been able to use her inhalers,      this is likely contributing to her symptoms of dyspnea.  I will restart      her Spiriva and Foradil as well as Xopenex as these would be less      likely to contribute to her problems with esophageal candidiasis.      Since she is not able to inhaled corticosteroids at this present time,      I will start her on Singulair 10 milligrams nightly and I would      continue her on this until she is able to resume the use of inhaled      corticosteroids after the treatment of her esophageal candidiasis.  2. Bilateral pleural effusions with a history of atrial flutter and      Takotsubo cardiomyopathy.  She did have a normal ejection fraction on     her echocardiogram from October 3.  However,  she appears to have had  some symptomatic benefit from diuresis.  What I would do for this at      this point is follow up on her brain natriuretic peptide as well as her      chest x-ray and continue diuresis.  If the pleural effusion is not      resolving with diuresis, then consideration could be given to possibly      undergoing a diagnostic thoracentesis as well as therapeutic      thoracentesis, although again given the fact that it is bilateral would      speak towards it more likely being a transudative effusion.  3. Gastrointestinal bleed with esophageal candidiasis.  She is continued      on Mycelex Troches for this and she has been evaluated by      Gastroenterology.  4. Idiopathic eosinophilia.  She has been evaluated by Dr. Si Gaul      as an outpatient for this and she is due to have followup with him.  I      would repeat her complete blood count with differential, although it      does not appear that she is having any symptoms related to this at this      time.      [name] Helling, MD  Electronically Signed     VS/MEDQ  D:  05/29/2006  T:  05/30/2006  Job:  [id number]

## 2011-01-08 NOTE — Assessment & Plan Note (Signed)
[facility]                         ELECTROPHYSIOLOGY OFFICE NOTE   NAME:[name], [name]                        MRN:          [id number]  DATE:09/22/2006                            DOB:          1935/05/24    [name] is seen following an evaluation at the hospital back in  September for atrial flutter.  She then developed Takotsubo's with a  loss of her ejection fraction down to about 25%, it has now recovered  normally.  She remains on Coumadin.  She has hypertension,  nonobstructive coronary disease and some degree of anxiety with all of  the above.   MEDICATIONS CURRENTLY:  Digoxin, lisinopril, Coumadin, Lexapro and  Lipitor and Lasix.  SHE HAS BEEN INTOLERANT OF BETA-BLOCKERS BECAUSE OF  BRADYCARDIA.   EXAMINATION TODAY:  Her blood pressure was 125/71, pulse was 44.  LUNGS:  Clear.  HEART SOUNDS:  Regular with a 2/6 systolic murmur, S2 was widely split  and S1 was quiet.  EXTREMITIES:  Without edema.   IMPRESSION:  1. Atrial flutter.  2. Takotsubo's.  3. Bradycardia.   We have discussed proceeding with RF catheter ablation of her atrial  flutter to allow [name] to come off of her Coumadin.  She would like to do  this following getting her teeth cleaned.  She will let us know when  that is done.  We did go ahead and review the potential benefits as well  as potential risks including, but not limited to, death and perforation,  heart block requiring pacemaker implantation.  We also discussed the  potential side effects including hemorrhage related to Coumadin therapy  and thromboembolism related to her atrial flutter.  She understands  these risks and, as noted, would like to proceed.     Duke Salvia, MD, [facility]  Electronically Signed    SCK/MedQ  DD: 09/22/2006  DT: 09/22/2006  Job #: [id number]

## 2011-01-08 NOTE — Assessment & Plan Note (Signed)
[facility]                             PULMONARY OFFICE NOTE   NAME:[name], [name] POMMIER                        MRN:          [id number]  DATE:07/26/2006                            DOB:          1934-09-01    This is an extended office visit.  Note is made that her chart is not  available at the time of her visit.   This 76 year old white female is followed here for mild persistent  asthma and idiopathic eosinophilia.  Most recently she has had  difficulties with atrial fibrillation and congestive heart failure, for  which she has been managed by Dr. Nicholes Mango.  The patient apparently  is to undergo ablation.  She presents today with no complaints with the  exception of difficulties with allergic rhinitis.  She has tried  Claritin over-the-counter, but prefers Clarinex prescription, as this  works better for her.  She denies any other issues.  She is currently  maintained on Spiriva and QVAR, as she was INTOLERANT OF BETA AGONISTS  due to her atrial fibrillation.   CURRENT MEDICATIONS:  Include Coumadin, aspirin, Reglan, Nexium,  Lipitor, Spiriva, QVAR, trazodone and Lexapro.   PHYSICAL EXAMINATION:  VITAL SIGNS:  Are as noted.  Oxygen saturation is  97% on room air.  GENERAL:  This is a well-developed female who is in no acute distress.  HEENT:  Examination is unremarkable.  She does have some mild allergic  rhinitis and conjunctivitis.  NECK:  Supple, no adenopathy noted, no JVD.  LUNGS:  Clear.  CARDIAC:  Regular rate and rhythm, no murmurs, rubs or gallops heard.  EXTREMITIES:  The patient has no cyanosis, no clubbing, no edema noted.   IMPRESSION:  1. Mild persistent asthma.  The patient is well controlled in this      regard.  2. Idiopathic eosinophilia, being managed by hematology/oncology.  3. Atrial fibrillation and congestive heart failure.  The patient      currently well compensated, managed by cardiology.   PLAN:  1. Will be for  the patient to continue her medications as they are.      For her allergic rhinitis issues I will prescribe her Clarinex 5 mg      one tablet daily as needed.  2. Followup will be in 3 months' time.  She is to contact us prior to      that time should any new problems arise.     Gailen Shelter, MD  Electronically Signed   CLG/MedQ  DD: 07/26/2006  DT: 07/27/2006  Job #: [id number]

## 2011-01-08 NOTE — Procedures (Signed)
NAMELADYE, [name]                 ACCOUNT NO.:  [id number]   MEDICAL RECORD NO.:  [id number]          PATIENT TYPE:  OUT   LOCATION:  SLEEP CENTER                 FACILITY:  [facility]   PHYSICIAN:  Barbaraann Share, MD,FCCPDATE OF BIRTH:  12/21/1936   DATE OF STUDY:  12/14/2006                            NOCTURNAL POLYSOMNOGRAM   REFERRING PHYSICIAN:  Gailen Shelter, MD   INDICATION FOR STUDY:  Other sleep disturbance 780.50.   EPWORTH SLEEPINESS SCORE:  Five.   SLEEP ARCHITECTURE:  The patient had a total sleep time of 387 minutes  with adequate slow wave sleep but significantly decreased REM. Sleep  onset latency was normal at 6 minutes and REM onset was very prolonged  at 180 minutes.  Sleep efficiency was decreased at 89%.  It should be  noted the patient had very large numbers of nonspecific arousals of  unknown etiology.   RESPIRATORY DATA:  The patient was found to have 4 hypopnea's and 1  obstructive apnea for an apnea/hypopnea index of 0.8 per hour.  The  patient had no significant O2 desaturations with only transient  desaturation to 89%.   OXYGEN DATA:  It should be noted the patient normally wears oxygen at 2  liters q.h.s. at home.  However, no oxygen was used for this study.   CARDIAC DATA:  No clinically significant cardiac arrhythmias were noted.   MOVEMENT-PARASOMNIA:  Small numbers of leg jerks were noted without  clinically significant sleep disruption.   IMPRESSIONS-RECOMMENDATIONS:  1. Small numbers of obstructive events which do not meet the      apnea/hypopnea index criteria for obstructive sleep apnea syndrome.      There was only mild snoring noted throughout.  2. Very fragmented sleep noted with large numbers of nonspecific      arousals.  Clinical correlation is suggested.  3. No significant cardiac arrhythmias nor leg movements were noted      during the study.      Barbaraann Share, MD,FCCP  Diplomate, American Board of Sleep  Medicine  Electronically Signed     KMC/MEDQ  D:  12/20/2006 16:47:05  T:  12/20/2006 84:69:62  Job:  [id number]

## 2011-01-08 NOTE — Assessment & Plan Note (Signed)
[facility]                             PULMONARY OFFICE NOTE   NAME:[name], [name]                        MRN:          [id number]  DATE:11/16/2006                            DOB:          08/04/1935    Dictation is delayed due to the fact that the chart was removed from the  office of Dr. Jayme Cloud immediately upon seeing the patient and was not  returned in a timely fashion.   The patient apparently was placed on oxygen at 2 liters nasal canula  nightly.  I am not certain exactly when this occurred or how this  happened.  She states that she has no difficulties with her breathing.  Because of issues with atrial fibrillation and Takotsubo syndrome, the  patient has been unable to tolerate beta agonist for her mild persistent  asthma and, therefore, she is being maintained on Spiriva and QVAR.  This has actually worked very well for her.  She has had difficulties  with rhinitis and uses Rhinocort.  She cannot take OTC Claritin due to  poor relief.  The patient apparently needs prior authorization for  Clarinex.   The patient denies any other issues today.  She has had difficulties  with idiopathic eosinophilia and is being treated for this by Dr.  Si Gaul.   CURRENT MEDICATIONS:  As noted on the intake sheet.  These have been  reviewed and are accurate.   PHYSICAL EXAMINATION:  VITAL SIGNS:  As noted.  Oxygen saturation is 96  percent on room air.  GENERAL:  This is a well-developed, thin female who is in no acute  distress.  HEENT:  Examination is unremarkable for age.  NECK:  Supple, no adenopathy noted.  No JVD.  LUNGS:  Clear to auscultation bilaterally.  CHEST WALL:  She has evidence of pacemaker.  This was placed in February  of this year.  CARDIAC:  Regular rate and rhythm.  No rubs, murmurs, or gallops heard.  She has a 2/6 systolic ejection murmur that is unchanged.  EXTREMITIES:  The patient has no cyanosis, no clubbing, no  edema noted.   IMPRESSION:  1. Mild persistent asthma.  The patient is actually currently well      compensated.  2. History of Takotsubo and sick sinus syndrome.  She is status post      pacemaker placement.  This is being managed by cardiology.  3. Chronic rhinitis.  4. History of oxygen requirement at night, query whether she has a      sleep disorder.  This has not been clearly documented.   PLAN:  1. Obtain a sleep study.  2. For her rhinitis, we will give her a prescription of Allegra 180 mg      daily, as this is now generic.  3. Continue other medications as they are.  We did provide her      prescriptions for Spiriva,      QVAR, and Rhinocort.  4. Followup will be in four to six weeks.  She is to contact us prior  to that time should any new problems arise.     Gailen Shelter, MD  Electronically Signed    CLG/MedQ  DD: 12/01/2006  DT: 12/01/2006  Job #: [id number]

## 2011-01-08 NOTE — H&P (Signed)
[name], [name]                 ACCOUNT NO.:  [id number]   MEDICAL RECORD NO.:  [id number]          PATIENT TYPE:  INP   LOCATION:  3729                         FACILITY:  [name]   PHYSICIAN:  [name]. [name] Som, MD, FACCDATE OF BIRTH:  [birth year]   DATE OF ADMISSION:  04/29/2006  DATE OF DISCHARGE:                                HISTORY & PHYSICAL   PRIMARY CARE PHYSICIAN:  Dr. Loreen [name].   GASTROENTEROLOGIST:  [name].   CARDIOLOGIST:  Dr. Arvilla Meres.   CHIEF COMPLAINT:  Blood in stool.   HISTORY OF PRESENT ILLNESS:  [name] is a very pleasant 76 year old female  patient, who initially saw [name] on April 11, 2006, for atrial  flutter of unknown duration.  She has a history of peptic ulcer disease with  GI bleed in the past.  [name] elected to placed her on Coumadin and  refer to electrophysiology for possible ablation to limit her Coumadin  exposure.  The patient, unfortunately, has had no follow up with her  Coumadin since it was started.  She saw [name] yesterday for routine  follow up.  She noted that she had been feeling weak and also having black  stools.  He checked some labs on her, and her hemoglobin had dropped 2 g,  and her INR was 9.4.  [name] spoke to Dr. Samule [name], who got the patient  added onto my schedule today.  The patient knows that she has continued to  feel quite fatigued over the last couple of weeks.  She notes some chronic  shortness of breath with exertion.  This is probably a little bit worse.  She denies orthopnea, passing out or dyspnea.  She has noted some  lightheadedness but denies any syncope.  She denies any chest pain.   PAST MEDICAL HISTORY:  1. As noted above is significant for atrial flutter.  2. Hypertension.  3. Chronic dyspnea secondary to asthmatic bronchitis and mild      bronchiectasis followed by [name].  4. 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  5. Recurrent pneumonia.  6. History of  peptic ulcer disease with GI bleed due to duodenal ulcer in      2004.   ALLERGIES:  1. NEOSPORIN.  2. PEROXIDE.   CURRENT MEDICATIONS:  1. Gleevec 400 mg daily.  2. Spiriva daily.  3. Advair 250/50 b.i.d.  4. Nexium 40 mg b.i.d.  5. Meclocycline 150 mg b.i.d.  6. Trazodone 200 mg q.h.s.  7. Rhinocort daily.  8. Albuterol p.r.n.  9. Multivitamin daily.  10.Mega-C with rose hips daily.  11.Metoprolol 50 mg b.i.d.  12.Coumadin which was placed on hold starting yesterday.   SOCIAL HISTORY:  The patient lives in [place] and is married.  Denies any  tobacco or alcohol abuse.   FAMILY HISTORY:  Mother died at age 31 due to asthma.  Father died age 36 of  unknown causes.  There is no history of premature coronary artery disease.   REVIEW OF SYSTEMS:  Please see HPI.  Denies any fevers, chills.  She has  noticed some  lower extremity edema.  This seems to be chronic.  She has  noted some black stools.  Denies any hematuria.  Rest of review of systems  are negative.   PHYSICAL EXAMINATION:  GENERAL:  She is a well-nourished well-developed  female in no acute distress.  VITAL SIGNS:  Blood pressure 143/87, pulse 82, weight 122 pounds.  HEENT:  Head normocephalic, atraumatic.  Eyes PERRLA.  EOMI.  Sclerae clear.  Palpebral conjunctivae are pink bilaterally.  NECK:  Without JVD.  CARDIAC:  Normal S1, S2.  Regular rate and rhythm.  Carotids without bruits  bilaterally.  ABDOMEN:  Soft, nontender with normoactive bowel sounds.  No organomegaly.  EXTREMITIES:  With 1-2+ pitting edema bilaterally.  Calves are soft,  nontender.  NEUROLOGIC:  She is alert and oriented x3.  Cranial nerves 2-12 grossly  intact.  LUNGS:  Clear to auscultation bilaterally.  SKIN:  Warm and dry.  RECTAL:  Maroon colored stool, heme-positive.   Electrocardiogram reveals atrial flutter with a heart rate of 82 with  variable A-V block.   DATA BASE:  Echocardiogram from April 18, 2006, overall left  ventricular  systolic function, normal mild mitral regurgitation, estimated peak right  ventricular systolic pressure was within the upper limits of normal.  From  September 6, sodium 131, potassium 3.9, BUN 9, creatinine 0.7, hemoglobin  10.3, hematocrit 29.3, INR 9.42.  From August 17, hemoglobin 12.1,  hematocrit 35.3, INR 0.8, BUN 10, creatinine 0.7.  TSH 1.19.   IMPRESSION:  1. Upper gastrointestinal bleed.  2. Supratherapeutic INR.  3. Atrial flutter with controlled ventricular rate.      a.     Coumadin therapy recently initiated April 11, 2006.  4. Anemia.  5. History of peptic ulcer disease with gastrointestinal bleed, 2004.  6. Idiopathic eosinophilia.  7. History of dyspnea secondary to asthma and bronchiectasis, followed by      [name].  8. History of recurrent pneumonia.  9. Hypertension.   PLAN:  The patient has also been examined by Dr. Jens [name].  The patient's  hemoglobin dropped from 12.1 on August to 10.3 on September 6, and her INR  is 9.4, and she has a history of peptic ulcer disease.  In the office, her  hemoccult is positive, and her stool is dark/maroon colored.  Certainly, we  are concerned that she is having an active gastrointestinal bleed.  With the  supratherapeutic INR, we feel it is best to watch her in the hospital.  Therefore, we plan to admit her.  We will continue to hold her Coumadin and  aspirin.  We will gently hydrate her.  We will continue to check serial  H&H.s.  We will ask gastroenterology to see the patient for possible  endoscopy.  She may need endoscopy prior to reinitiating Coumadin for atrial  flutter ablation.  If her hemoglobin and hematocrit drop further, we may  need to reverse her Coumadin with vitamin K.     ______________________________  [name] Newcomer, PA-C    ______________________________  [name]. [name] Som, MD, [facility]    SW/MEDQ  D:  04/29/2006  T:  04/29/2006  Job:  [id number]  cc:   [name], M.D.  [name], M.D.

## 2011-01-08 NOTE — Op Note (Signed)
[name], [name]                 ACCOUNT NO.:  [id number]   MEDICAL RECORD NO.:  [id number]          PATIENT TYPE:  AMB   LOCATION:  ENDO                         FACILITY:  [facility]   PHYSICIAN:  Bernette Redbird, M.D.   DATE OF BIRTH:  27-Oct-1934   DATE OF PROCEDURE:  06/30/2005  DATE OF DISCHARGE:                                 OPERATIVE REPORT   PROCEDURE:  Upper endoscopy.   INDICATIONS:  [name] is a very pleasant 76 year old female with a roughly  15-month history of epigastric discomfort and loss of appetite. The symptoms  are nonspecific but they arose after her initial dose of Boniva medication.  She has had early satiety and some mild weight loss.   FINDINGS:  Esophageal candidiasis.   PROCEDURE:  The nature, purpose and risks of the procedure were familiar to  the patient who provided written consent. Sedation was fentanyl 50 mcg and  Versed 5 mg IV without arrhythmias or desaturation. The Olympus small-  caliber adult video endoscope was passed under direct vision without  difficulty. The vocal cords were not well seen.   The esophagus was pertinent for extensive white plaques suggestive of  esophageal candidiasis. Brushings for fungal smear were obtained. No  biopsies were performed. There was no evidence of varices or esophageal  neoplasia. A minimal hiatal hernia was present.   The stomach contained no significant residual and had normal mucosa without  evidence of any gastritis, erosions, ulcers, polyps or masses including a  retroflexed view of the cardia, and the pylorus, duodenal bulb and second  duodenum looked normal.   The patient tolerated the procedure well and there were no apparent  complications.   IMPRESSION:  Esophageal candidiasis, which may or may not be related to the  patient's upper tract symptoms (112.84).   PLAN:  1.  Await results of fungal smear.  2.  In the meantime, go ahead and start nystatin oral suspension 500,000      units 4 times  a day for approximately 2 weeks.  3.  Office follow-up as scheduled in a couple of weeks to monitor the      patient's progress.           ______________________________  Bernette Redbird, M.D.     RB/MEDQ  D:  06/30/2005  T:  06/30/2005  Job:  [id number]   cc:   Loreen Freud, M.D.  [address] [place]  [place] [address]

## 2011-01-08 NOTE — Assessment & Plan Note (Signed)
[facility]                              CARDIOLOGY OFFICE NOTE   NAME:[name], [name]                        MRN:          [id number]  DATE:04/11/2006                            DOB:          [birth year]    CONSULTATION:   REFERRING PHYSICIAN:  Loreen Freud, MD   PATIENT IDENTIFICATION:  [name] is a 76 year old woman referred by Dr.  Laury Axon for further evaluation of atrial flutter.   HISTORY OF PRESENT ILLNESS:  [name] denies any known history of cardiac  disease.  She has had chronic dyspnea, for which she has been evaluated by  Dr. Jayme Cloud.  She carries a diagnosis of mild bronchiectasis and asthmatic  bronchitis.  She underwent an echocardiogram in July 2005, which showed  normal LV function with mild mitral regurgitation.  She has never had a  cardiac catheterization.  Her history is also notable for an idiopathic  eosinophilia, for which she has received Gleevec.  Finally, she also has had  several episodes of pneumonia as well as peptic ulcer disease with  significant GI bleed due to duodenal ulcer in 2004.   She went to see Dr. Laury Axon last week for routine follow-up.  Routine EKG was  obtained, which showed atrial flutter with a controlled ventricular  response.  She was thus referred her for further evaluation.  She denies any  chest pain.  She does have occasional palpitations but no syncope or  presyncope.  She does have chronic fatigue and dyspnea, which she feels is  worse over the past year or two but her husband thinks it is stable if not  some better.   REVIEW OF SYSTEMS:  Positive for mild lower extremity edema, some possible  claudication, headaches, arthritis, reflux disease.  She denies any fevers,  chills, nausea, vomiting, abdominal pain, change in her urinary or bladder  habits.  The remainder of the review of systems is negative except for HPI  and problem list.   PROBLEM LIST:  1. Chronic dyspnea secondary to  asthmatic bronchitis and mild      bronchiectasis, followed by C. Danice Goltz, MD  2. Idiopathic eosinophilia, on Gleevec.  3. History of recurrent pneumonia.  4. Peptic ulcer disease with gastrointestinal bleed due to duodenal ulcer      in 2004.   CURRENT MEDICATIONS:  1. ProAir HFA two puffs once or twice a day.  2. Claritin.  3. Nexium.  4. Trazodone 100 mg a day.  5. Rhinocort.  6. Iron.  7. Advair 250/50 mcg one puff b.i.d.  8. Xopenex.  9. Vitamin C.  10.Calcium.  11.Multivitamin.  12.Aspirin 81 mg daily.  13.Coumadin, which was recently started.  14.Spiriva.  15.Gleevec.  16.Demeclocycline 150 mg q.12h.   ALLERGIES:  NEOSPORIN and PEROXIDE.   SOCIAL HISTORY:  She is married and lives in [place].  She is a  housewife.  Denies any history of tobacco or alcohol abuse.   FAMILY HISTORY:  Mother died at 65 due to asthma.  Father died at age 35 of  unknown causes.  Sister died at  69 of unknown causes.  There is no history  of premature coronary artery disease.   PHYSICAL EXAMINATION:  GENERAL:  She is a bit anxious but in no acute  distress.  Respirations are unlabored.  VITAL SIGNS:  Blood pressure 138/80 with a heart rate of 87.  The weight is  120 pounds.  HEENT:  Sclerae anicteric.  EOMI.  There are no xanthelasmas.  Mucous  membranes are moist.  NECK:  Supple.  There is no JVD.  Carotids are 2+ bilaterally, no bruits.  There is no lymphadenopathy or thyromegaly.  CARDIAC:  Irregularly irregular with a soft systolic ejection murmur at the  left sternal border.  LUNGS:  Clear.  ABDOMEN:  Soft, nontender, nondistended, no hepatosplenomegaly, no bruits,  no masses.  EXTREMITIES:  Warm with no cyanosis, clubbing or edema.  NEUROLOGIC:  She is alert and oriented x3.  Cranial nerves II-XII are  intact.  She moves all four extremities without difficulty.  She is mildly  anxious.   EKG shows atrial flutter with variable AV block and an overall ventricular   response of 87 beats per minute.   ASSESSMENT AND PLAN:  1. Atrial flutter of unknown duration, relatively asymptomatic.  Given her      age and hypertension, I do think it is reasonable to proceed with      Coumadin therapy.  We had a long talk with her and her husband about      the mechanisms underlying atrial flutter and the possible therapies for      it.  She is interested in pursuing possible ablation.  Given her      previous gastrointestinal bleed, I do think it would be prudent to      attempt to limit her exposure to Coumadin, and we could accomplish this      through a successful ablation.  At this point we will put her on 50 mg      of Toprol to help with rate control.  We will also put a 48 hour Holter      monitor on her, check a 2-D echo and TSH.  I will review the case with      an electrophysiologist to make sure that this is a right-sided flutter,      which would be more amenable to ablation than a left-sided one.  Should      this be a left-sided flutter, it may be more prudent just to proceed      with cardioversion after her INR has been therapeutic for 4-6 weeks and      to see if she can maintain sinus rhythm.  2. Hypertension.  This is mildly elevated.  We have added Toprol.   DISPOSITION:  We will see her back in clinic in about a month to review her  tests with her and further discuss a possible ablation.                                [name], MD    DRB/MedQ  DD:  04/11/2006  DT:  04/12/2006  Job #:  [id number]

## 2011-01-08 NOTE — Consult Note (Signed)
   [name], [name]                           ACCOUNT NO.:  [id number]   MEDICAL RECORD NO.:  [id number]                   PATIENT TYPE:  INP   LOCATION:  4709                                 FACILITY:  [facility]   PHYSICIAN:  John C. Madilyn Fireman, M.D.                 DATE OF BIRTH:  November 10, 1934   DATE OF CONSULTATION:  02/07/2003  DATE OF DISCHARGE:                                   CONSULTATION   REASON FOR CONSULTATION:  GI bleeding.   HISTORY OF PRESENT ILLNESS:  The patient is a 76 year old white female who  was vacationing in [place] and developed frank hematemesis and her cruise  ship was diverted to [place] where she debarked and was hospitalized and  had a hemoglobin of 6 and was transfused four units of packed red blood  cells.  EGD reportedly showed small duodenal ulcers responsible for the  bleeding.  The patient has never had any previous GI bleeding but had been  taking Celebrex for a year.  She was stabilized and her hemoglobin responded  to transfusion but she apparently developed a pneumonia thereafter.  She was  stabilized and then transferred back up here and was admitted last night.  She has never had any prior GI bleeding.  Her hemoglobin here was 10.2 and  she had an normal BUN. She states stools are still a little bit dark but she  denies any dizziness or abdominal pain.   PAST MEDICAL HISTORY:  1. Osteoporosis.  2. Gastroesophageal reflux.  3. Allergies and asthma.   ALLERGIES:  No known drug allergies.   MEDICATIONS:  Actonel. Celebrex.  Singulair, Advair, albuterol, Zyrtec,  Rhinocort.   PHYSICAL EXAMINATION:  GENERAL APPEARANCE:  A well-developed, well-nourished  white female, slightly pale, in no acute distress.  CARDIOVASCULAR:  Regular rate and rhythm without murmur.  ABDOMEN:  Soft and nondistended with normal active bowel sounds.  No  hepatosplenomegaly, masses or guarding.   IMPRESSION:  Recent gastrointestinal bleed six days out with duodenal  ulcer.  Bleeding apparently stopped.    PLAN:  Will check Hemoccult to see if she has cleared her GI tract of blood.  Will also check H. pylori antibody and treat for eradication if Helicobacter  is positive, otherwise treat with proton pump inhibitor.                                               John C. Madilyn Fireman, M.D.    JCH/MEDQ  D:  02/07/2003  T:  02/08/2003  Job:  [id number]   cc:   [name], M.D.  [address] [place]  [place], [place] [address]  Fax: [phone]

## 2011-01-08 NOTE — Assessment & Plan Note (Signed)
[facility]                              CARDIOLOGY OFFICE NOTE   NAME:[name], [name] ZAMOR                        MRN:          [id number]  DATE:07/07/2006                            DOB:          [birth year]    PRIMARY CARE PHYSICIAN:  Dr. Loreen Freud.   GASTROENTEROLOGIST:  Dr. Matthias Hughs.   PATIENT IDENTIFICATION:  [name] is a 76 year old woman who returns for a  post-hospital followup.   PROBLEM LIST:  1. 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  This is now resolved.      a.     Most recent echocardiogram, left ventricular ejection fraction       is 65% with no significant valvular abnormalities.  2. Atrial flutter status post cardioversion.  3. Recent gastrointestinal bleed requiring transfusion with negative EGD      September 2007.  4. Idiopathic eosinophilia.  5. Nonobstructive coronary artery disease on cath.  6. Hypertension.  7. Hyperlipidemia.  8. Chronic dyspnea secondary to asthmatic bronchitis.  9. Anxiety.   CURRENT MEDICATIONS:  1. Aspirin 81 mg a day.  2. Coumadin.  3. Reglan 10 q.i.d.  4. Nexium 40 b.i.d.  5. Lipitor 10 a day.  6. Coreg 3.125 b.i.d.  7. Candesartan 4 mg a day.  8. Spiriva 18 mcg a day.  9. Lasix 20 a day.  10.Potassium 20 a day.  11.Qvar with spacer b.i.d.  12.Trazodone 200 mg at night.  13.Xanax 0.25 mg q.4 hours p.r.n.  14.Phenergan p.r.n.   INTERVAL HISTORY:  [name] returns today for routine followup.  She says  she is feeling much better.  She is able to do all of her daily activities  without any shortness of breath.  There is no chest pain.  She has not had  any palpitations or lower extremity edema, no orthopnea.  No evidence of GI  bleeding.  She did see Dr. Matthias Hughs yesterday and it appears that her stool  is weakly heme-positive, but this was not thought to be of significant  concern.   PHYSICAL EXAM:  She is  well-appearing, in no acute distress.  Ambulates  around the clinic with no real respiratory difficulty.  Blood pressure is 115/60, her heart rate is 51.  Weight is 113.  HEENT:  Sclerae anicteric.  EOMI.  There are no xanthelasma.  Mucous  membranes are moist.  Oropharynx is clear.  Dentition is normal.  NECK:  Supple.  No JVD.  Carotids are 2+ bilaterally without any bruits.  There is no lymphadenopathy or thyromegaly.  CARDIAC:  She is bradycardic and regular.  No murmurs, rubs, or gallops.  LUNGS:  Clear.  ABDOMEN:  Soft, nontender, nondistended.  No hepatosplenomegaly.  No bruits.  No masses.  EXTREMITIES:  Warm with no significant cyanosis, clubbing, or edema.  There  are no rashes.  NEURO:  She is alert and oriented x3.  Cranial nerves 2-12 are intact.  She  moves all 4 extremities without difficulty.   ASSESSMENT AND PLAN:  [name] looks much better today.  Her cardiomyopathy  and heart failure have resolved.  She is somewhat bradycardic, and thus,  will have to stop her Coreg.  She has not had any recurrence of her atrial  flutter.  I am hoping that, should she continue to improve, we will get her  over to electrophysiology for possible atrial flutter ablation in the near  future.  She will return to the clinic in several months for routine  followup.     [name], MD  Electronically Signed    DRB/MedQ  DD: 07/07/2006  DT: 07/07/2006  Job #: [id number]   cc:   Loreen Freud, M.D.  Bernette Redbird, M.D.  Gailen Shelter, MD

## 2011-01-08 NOTE — Consult Note (Signed)
[name], [name]                 ACCOUNT NO.:  [id number]   MEDICAL RECORD NO.:  [id number]          PATIENT TYPE:  INP   LOCATION:  2926                         FACILITY:  [facility]   PHYSICIAN:  Fayrene Fearing L. Randa Evens, M.D. DATE OF BIRTH:  1934-10-23   DATE OF CONSULTATION:  DATE OF DISCHARGE:                                   CONSULTATION   We were asked to consult on [name].  Consult was called by Dr.  Arvilla Meres, and the consult with to rule out GI bleed.   HISTORY OF PRESENT ILLNESS:  The patient is currently complaining of nausea  and vomiting x2-3 days.  She says this causes her throat and stomach to feel  like they are on fire.  She reports that she started taking an increased  number of medications after her recent cardiac problems, and that may have  contributed to her inability to eat.  She is able to keep down only small  amounts of clear liquids, such as sips of water or hot tea.  She denies  melena, hematochezia, hematemesis, or change in bowel habits.  She reports  the she does have amber amber-colored urine intermittently, abdominal pain,  nausea, regurgitation of food, liquids, and medication.  She is positive for  weight loss of 15-20 pounds in the last 3 months.  She is positive for loss  of appetite and positive for heartburn. Recently, she was seen in the office  by Dr. Matthias Hughs, who prescribed Nexium twice a day for her.  Unfortunately,  her insurance would not cover Nexium twice a day, so she has only been  taking Nexium once a day at home. On April 29, 2006, she had an upper  endoscopy that showed esophageal Candida, and in the same time frame she had  a colonoscopy that showed moderate to severe diverticulosis in the sigmoid  colon.  She did have a recent GI bleed on April 30, 2006, due to  overanticoagulation.  There were no signs of bleeding on either her  endoscopic or her colon exam.   PAST MEDICAL HISTORY:  1. Peptic ulcer disease, with an  upper GI bleed 3 years ago while she was      taking Celebrex.  2. Diverticulosis.  3. Hypertension.  4. Atrial flutter.  She is status post TEE and DC cardioversion in      September 2007.  5. Cardiomyopathy, Tako-Tsubo.  6. Recurrent pneumonia.  7. UTI with Klebsiella in September 2007.  8. Cholecystectomy when she was 76 years old.   CURRENT MEDICATIONS:  1. An 81 mg aspirin a day.  2. Declomycin.  3. Protonix.  4. Spiriva.  5. Advair.  6. Zofran.   RECENTLY DISCONTINUED MEDICATIONS:  1. Trazodone.  2. Boniva.  3. Coumadin.  4. Metoprolol.  5. Gleevec.  Gleevec seems to cause an eosinophilia on [name].   MEDICATION ALLERGIES:  1. PEROXIDE.  2. NEOSPORIN.   FAMILY HISTORY:  Negative for colon, liver, and pancreatic cancer.   SOCIAL HISTORY:  Negative ETOH, negative tobacco.  She lives with her  husband  at home.   REVIEW OF SYSTEMS:  Significant for dyspnea and weakness.   PHYSICAL EXAMINATION:  GENERAL:  She is alert and oriented, in no apparent  distress, lying in bed.  She appears very frail.  CARDIOVASCULAR:  Bradycardic, with a regular rhythm.  ABDOMEN:  Thin, soft, nondistended, with decreased bowel sounds.  She is  tender in the lower right quadrant and epigastric area to palpation.  EXTREMITIES:  Show no edema.  VITAL SIGNS:  Currently temperature 98.6, respirations 18, pulse 50, blood  pressure 135/75.   LABORATORIES:  Abdominal x-ray done yesterday shows a nonobstructive,  nonspecific gas pattern. Current CBC shows a hemoglobin of 11.6.  Her  hemoglobin yesterday was 14.9.  White count 7.8, platelets of 312,  hematocrit 33.4. Her Chem-7 shows a sodium of 134, potassium 5.1, chloride  110, bicarbonate 22, BUN 27, creatinine 1.1, glucose 99, AST is 32, ALT is  27, alk phos of 59, and total bilirubin 0.7.  Serum albumin is 3.4.  Fecal  occult blood test today was negative, but stool smear was very minimal.   ASSESSMENT/PLAN:  Done by Dr. Randa Evens:  This  is a 76 year old female with  multiple medical problems, suffering from 2-3 days of nausea and food  regurgitation.  She reports a 15-20 pound weight loss over the last 3  months. She has had recent heme-positive stools in September 2007.  Currently, she has no obvious signs of bleed, either upper or lower GI.  Dr.  Randa Evens has seen and evaluated the patient.  He is scheduling her for an EGD  tomorrow to follow up on the Candida that was found last month.  They were  unable to take biopsies at that time because she was there  overanticoagulated.  He will also double her current dose of Protonix, and  if her EGD is normal he will likely do a gastric emptying study.  Thanks  very much for this consultation.      Stephani Police, PA    ______________________________  Llana Aliment Randa Evens, M.D.    MLY/MEDQ  D:  05/25/2006  T:  05/26/2006  Job:  [id number]   cc:   [name], MD

## 2011-01-08 NOTE — Op Note (Signed)
NAMESHERVON, [name]                 ACCOUNT NO.:  [id number]   MEDICAL RECORD NO.:  [id number]          PATIENT TYPE:  INP   LOCATION:  2926                         FACILITY:  [facility]   PHYSICIAN:  Fayrene Fearing L. Malon Kindle., M.D.DATE OF BIRTH:  1934-12-27   DATE OF PROCEDURE:  05/27/2006  DATE OF DISCHARGE:                                 OPERATIVE REPORT   PROCEDURE:  Esophagogastroduodenoscopy with brushing.   MEDICATIONS:  Cetacaine spray, fentanyl 50 mcg, Versed 4 mg IV.   INDICATION:  Patient has a long history of heart disease, was admitted, had  some inability to swallow, had a scope done several weeks ago with possible  yeast.  She is doing a little bit better today but we went ahead and  performed a repeated endoscopy.  Her Coumadin has been  on hold.  The  reasons for doing this were to look for possible yeast infection,  strictures, etc.   DESCRIPTION OF PROCEDURE:  The procedure had been explained to the patient.  Consent obtained.  Her INR has dropped since she came in; her Coumadin has  been on hold; it is down to 2.1 today.  I felt that it was safe to do the  procedure at that point.  She swallowed the scope without problem.  It went  down her esophagus.  It was a cheesy exudate coating her esophagus, though  it was quite mild actually.  We went down into stomach and pylorus was  identified and passed.  The duodenum including the bulb and second portion  were seen well and were unremarkable.  The pyloric channel was normal.  The  antrum and body were normal.  The fundus and cardia were seen well and the  retroflex view was normal.  Came back into the esophagus.  Again, there was  a cheesy exudate.  There was no stricture whatsoever throughout the whole  esophagus.  I went ahead and did a gentle brushing and this was sent for  fungal smear and culture.  The scope was withdrawn.  The patient tolerated  the procedure well.   ASSESSMENT:  Possible fungal esophagitis.   PLAN:   This appears quite mild and we therefore will treat with Mycelex  troches.  I think it is okay to resume all of her anticoagulation and other  treatments.           ______________________________  Llana Aliment. Malon Kindle., M.D.     Waldron Session  D:  05/26/2006  T:  05/27/2006  Job:  [id number]   cc:   [name], MD  Marne A. Milinda Antis, MD

## 2011-01-08 NOTE — Assessment & Plan Note (Signed)
[facility]                         ELECTROPHYSIOLOGY OFFICE NOTE   NAME:BERGDietrich, Ke                          MRN:          [id number]  DATE:11/14/2006                            DOB:          1934-09-27    Ms. Ackroyd was seen today in the clinic on November 14, 2006, for a wound  check of her newly-implanted Medtronic model number [id number] Adapta.  Date  of implant was October 19, 2006, for sinus node dysfunction.  On  interrogation of her device today her battery voltage is 2.79.  P waves  measured 4.0 to 5.6 millivolts with an atrial capture threshold of 1  volt at 0.4 milliseconds and an atrial lead impedance of 526 ohms.  R  waves measured 15.68 to 22.40 millivolts with a ventricular pacing  threshold of 1 volt at 0.4 milliseconds and a ventricular lead impedance  of 567 ohms.  No changes were made in her parameters.  Her Steri-Strips  were removed, her wound is without redness or edema.  She did have a  chest x-ray done today and she will see Dr. Graciela Husbands back in May.      [name], LPN  Electronically Signed      Duke Salvia, MD, [facility]  Electronically Signed   PO/MedQ  DD: 11/14/2006  DT: 11/14/2006  Job #: [id number]

## 2011-01-08 NOTE — Op Note (Signed)
[name], [name]                 ACCOUNT NO.:  [id number]   MEDICAL RECORD NO.:  [id number]          PATIENT TYPE:  OIB   LOCATION:  2899                         FACILITY:  [facility]   PHYSICIAN:  Duke Salvia, MD, FACCDATE OF BIRTH:  [birth year]   DATE OF PROCEDURE:  10/19/2006  DATE OF DISCHARGE:                               OPERATIVE REPORT   PREOPERATIVE DIAGNOSIS:  Sinus node dysfunction, prolonged AV Wenckebach  cycle length.   POSTOPERATIVE DIAGNOSIS:  Sinus node dysfunction, prolonged AV  Wenckebach cycle length.   PROCEDURE:  Dual-chamber pacemaker implantation.   Following obtaining informed consent, and the patient's prior atrial  flutter ablation procedure, the patient was brought back to the  electrophysiology laboratory and placed on the fluoroscopic table in  supine position.  After routine prep and drape of the left upper chest,  lidocaine was infiltrated in prepectoral subclavicular region.  Incision  was made and carried down to layer of the prepectoral fascia with  electrocautery and sharp dissection.  A pocket was formed similarly.  Hemostasis was obtained.   Thereafter, attention was turned to gaining access to the extrathoracic  left subclavian vein which was accomplished without difficulty and  without the aspiration of air or puncture of the artery.   Two separate venipunctures were accomplished.  Guidewires were placed  and retained and sequentially two Medtronic 5076 leads were placed, the  ventricular lead was 52 cm in length, serial number [id number] and the  Medtronic 5076 45-cm active fixation atrial lead had a serial number [id number]  [id number].  Under fluoroscopic guidance, these leads were manipulated to  the right atrial the right ventricular septum and the low right atrial  free wall.  Unfortunately the atrial appendage had P-waves less than 1  mV.   In their final location the bipolar R wave was 7.5 with a pace impedance  of 734, threshold 1.1  volts at 0.5 milliseconds with current threshold  2.0 MA.   The bipolar P-wave was 3.2 mV with a pace impedance of 685 ohms, a  threshold 1.4 volts, 0.5 milliseconds.  Current at threshold is 2.5 MA  and the current of injury with both leads was quite brisk.  These leads  were secured to the prepectoral fascia.  Hemostatic sheath was placed  and the leads were attached to a Medtronic Adapta ADDR01  pulse  generator, serial number [id number].  Atrial ventricular pacing and  atrial pacing were noted.  I should note that the ventricular lead was  marked with a tie.  The pocket was copiously irrigated with antibiotic  containing saline solution.  Hemostasis was assured.  The leads and  pulse generator were placed in the pocket secured to the prepectoral  fascia.  The wound was closed in three layers in normal fashion.  The  wound was washed,  dried and a benzoin Steri-Strip dressing was applied.  Needle counts,  sponge counts and instrument counts were correct at the end of procedure  according to staff.  The patient tolerated the procedure without  apparent complication.      Salvatore Decent  Graciela Husbands, MD, [facility]  Electronically Signed     SCK/MEDQ  D:  10/19/2006  T:  10/19/2006  Job:  [id number]

## 2011-01-08 NOTE — Op Note (Signed)
[name], [name]                 ACCOUNT NO.:  [id number]   MEDICAL RECORD NO.:  [id number]          PATIENT TYPE:  INP   LOCATION:  3729                         FACILITY:  [facility]   PHYSICIAN:  John C. Madilyn Fireman, M.D.    DATE OF BIRTH:  05/14/1935   DATE OF PROCEDURE:  05/01/2006  DATE OF DISCHARGE:                                 OPERATIVE REPORT   NAME OF PROCEDURE:  Esophagogastroduodenoscopy.   INDICATIONS FOR PROCEDURE:  Patient with GI bleeding associated with over  anticoagulation on Coumadin.   PROCEDURE:  The patient was placed in the left lateral decubitus position  and placed on the pulse monitor with continuous low-flow oxygen delivered by  nasal cannula.  She was sedated with 50 mcg IV fentanyl and 7.5 mg IV  Versed.  Olympus video endoscope was advanced under direct vision into the  oropharynx and esophagus.  The esophagus was straight and of normal caliber.  The squamocolumnar line at 38 cm.  There appeared to be fairly typical  candidal plaques throughout the esophagus although they were not as discrete  as the classic lesion.  I did not do brushings due to her elevated INR.  No  other abnormalities were seen in the esophagus.  The stomach was entered and  a small amount of liquid secretions were suctioned from the fundus.  Retroflexed view of cardia was unremarkable.  The fundus and body appeared  normal.  There was mild erythema and granularity with no focal lesion in her  antrum.  No source of bleeding seen.  The pylorus was not nondeformed and  easily allowed passage of the endoscope tip into the duodenum.  Both bulb  and second portion were well inspected and appeared be within normal limits.  The scope was then withdrawn and the patient returned to the recovery room  in stable condition.  She tolerated the procedure well.  There were no  immediate complications.   IMPRESSION:  Apparent candidal esophagitis with minimal gastritis.  No  bleeding source seen.   PLAN:  Will proceed with colonoscopy within the next day or two and treat  her with a brief course of Diflucan.           ______________________________  Everardo All. Madilyn Fireman, M.D.     JCH/MEDQ  D:  05/01/2006  T:  05/02/2006  Job:  [id number]   cc:   Madolyn Frieze. Jens Som, MD, [facility]

## 2011-01-08 NOTE — H&P (Signed)
[name], [name]                 ACCOUNT NO.:  [id number]   MEDICAL RECORD NO.:  [id number]            PATIENT TYPE:   LOCATION:                                 FACILITY:   PHYSICIAN:  [name], MD     DATE OF BIRTH:   DATE OF ADMISSION:  DATE OF DISCHARGE:                                HISTORY & PHYSICAL   PRIMARY CARE PHYSICIAN:  Marne A. Milinda Antis, M.D.   REASON FOR ADMISSION:  Nausea, vomiting, and hypotension.  Question digoxin  toxicity.   HISTORY OF PRESENT ILLNESS:  [name] is a 76 year old woman with multiple  medical problems.  I first saw her about a month ago when she presented with  new onset of atrial flutter.  She underwent cardioversion on May 05, 2006.  Unfortunately, this was complicated by GI bleeding in the setting of  supratherapeutic INR.  She underwent transfusion and had EGD which was  unremarkable.  Unfortunately, several weeks later she was again admitted  with an acute coronary syndrome and heart failure.  Catheterization showed a  takotsubo cardiomyopathy with profoundly decreased ejection fraction.  She  was treated for heart failure and discharged home on May 16, 2006.  She presents today feeling increasingly weak.  She has had about a 12 pound  weight gain.  Over the past 24 to 48 hours, she has been unable to hold down  any of her medications.  She has been having nausea and vomiting.  She has  also been anorectic.  She has not had any bright red blood per rectum,  melena, or diarrhea.  She denies fevers.  She has been feeling cold but no  frank chills.   Today in clinic, she looked acutely ill, with a respiratory rate of 36.  EKG  showed junctional bradycardia, and her blood pressure appeared to be  70/palp, though it was not easily auscultated.   PAST MEDICAL HISTORY:  1. Congestive heart failure secondary to takotsubo cardiomyopathy in      September of 2007.  2. Atrial flutter status post DC cardioversion in September  of 2007.  3. Recent GI bleed requiring transfusion with negative EGD in September of      2007.  4. Etiopathic eosinophilia.  5. Recent UTI on ciprofloxacin.   CURRENT MEDICATIONS:  1. Spiriva 1 puff a day.  2. Advair once a day.  3. Nexium 40 mg daily.  4. Demeclocycline 150 mg q.12h.  5. Trazodone 200 mg q.h.s.  6. Rhinocort once a day.  7. Albuterol as needed.  8. Multivitamin as needed.  9. Coumadin as directed.  10.Lisinopril 20 a day.  11.Coreg 6.25 b.i.d.  12.Digoxin 0.125 a day.  13.Lasix 20 a day.  14.Lipitor 10 a day.  15.Aspirin 81 a day.  16.Potassium 20 b.i.d.  17.Spironolactone 25 a day.  18.Clarinex.   ALLERGIES:  NEOSPORIN AND PEROXIDE.   SOCIAL HISTORY:  The patient lives in [place] with her husband.  She  denies any tobacco or alcohol use.   FAMILY HISTORY:  Mother died at the age of  60 due to asthma.  Father died at  1 due to unknown causes.  Sister had a reported history of a heart attack  at age 83.   PHYSICAL EXAMINATION:  GENERAL:  She is acutely ill-appearing.  She is pale  and short of breath.  VITAL SIGNS:  Respirations 36 per minute.  Systolic blood pressure is  apparently 70/palp, but this is not well-auscultated.  Heart rate is in the  40s.  HEENT:  Sclerae anicteric.  EOMI.  Conjunctivae are pale.  Mucous membranes  are dry.  NECK:  Supple.  There is no JVD.  Carotids are 2+ bilaterally with no  bruits.  There is no lymphadenopathy or thyromegaly.  CARDIAC:  She is bradycardic and regular.  No obvious murmurs, rubs, or  gallops.  LUNGS:  Clear.  ABDOMEN:  Soft, nondistended.  There is mild diffuse tenderness.  No focal  rebound or guarding.  Good bowel sounds.  No high-pitch sounds.  EXTREMITIES:  Warm with no clubbing, cyanosis, or edema.  Pulses are weak.  NEUROLOGIC:  She is alert and oriented x3.  Cranial nerves II-XII are  intact.  She moves all 4 extremities without difficulty.   EKG shows junctional bradycardia, rate of 47,  with profound deep T wave  inversions throughout.   ASSESSMENT:  1. Nausea, vomiting, hypotension.  Suspect hypovolemia +/- digoxin      toxicity.  All concern for a possible gastrointestinal bleed and renal      failure.  2. Junctional rhythm.  3. History of congestive heart failure secondary to takotsubo      cardiomyopathy.  4. Recent gastrointestinal bleed requiring transfusion, with negative      esophagogastroduodenoscopy and colonoscopy.  5. Recent urinary tract infection.   PLAN:  We will admit her to stepdown.  We will hydrate her aggressively with  IV fluids.  She will need stat labs, including CBC, CMET, coags, digoxin  level.  We will also check a urinalysis and urine culture.  We will hold her  digoxin, Lasix, ACE inhibitor, and Coumadin as well as spironolactone for  now.  Should she be digoxin toxic, she will obviously need Digibind.      [name], MD  Electronically Signed     DRB/MEDQ  D:  05/24/2006  T:  05/24/2006  Job:  [id number]   cc:   Marne A. Milinda Antis, MD

## 2011-01-22 ENCOUNTER — Other Ambulatory Visit: Payer: Self-pay | Admitting: Emergency Medicine

## 2011-02-11 ENCOUNTER — Encounter: Payer: Medicare HMO | Admitting: *Deleted

## 2011-02-15 ENCOUNTER — Other Ambulatory Visit: Payer: Self-pay | Admitting: Family Medicine

## 2011-02-15 NOTE — Telephone Encounter (Signed)
Last refilled 11/04/10 when pt was last seen in office and recommended 1 year follow up. Please advise.

## 2011-02-15 NOTE — Telephone Encounter (Signed)
Done by MD.

## 2011-02-17 ENCOUNTER — Encounter: Payer: Self-pay | Admitting: *Deleted

## 2011-02-19 ENCOUNTER — Other Ambulatory Visit: Payer: Self-pay

## 2011-02-19 ENCOUNTER — Ambulatory Visit (INDEPENDENT_AMBULATORY_CARE_PROVIDER_SITE_OTHER): Payer: Medicare HMO | Admitting: *Deleted

## 2011-02-19 DIAGNOSIS — I495 Sick sinus syndrome: Secondary | ICD-10-CM

## 2011-02-19 DIAGNOSIS — Z950 Presence of cardiac pacemaker: Secondary | ICD-10-CM

## 2011-02-22 LAB — REMOTE PACEMAKER DEVICE
AL IMPEDENCE PM: 467 Ohm
ATRIAL PACING PM: 100
ATRIAL PACING PM: 100
BAMS-0001: 175 {beats}/min
BATTERY VOLTAGE: 2.77 V
BAVD-0002: 180 ms
BPAC-0002RA: 1.5 V
BPAC-0003RA: 0.4 ms
BPAC-0003RV: 0.4 ms
BRDY-0002: 60 {beats}/min
BSEN-0002RA: 0.5 mv
DEV-0004LDO: 5076
DEV-0006LDO: 20080227
DEV-0006LDO: 20080227
DEV-0020PM: NEGATIVE
DEV-0023LDO: 0
DEV-0023LDO: 0
DEV-0023PM: 0
DEV-0024PM: 85
EVAL-0001E5: 20120629130300
EVAL-0015E5: NEGATIVE
RV LEAD IMPEDENCE PM: 476 Ohm
RV LEAD THRESHOLD: 1.25 V
RV LEAD THRESHOLD: 1.25 V
TCAP-0003RA: 0.4 ms
TCAP-0003RV: 0.4 ms
VENTRICULAR PACING PM: 100

## 2011-02-23 NOTE — Progress Notes (Signed)
Pacer remote check  

## 2011-02-25 ENCOUNTER — Other Ambulatory Visit: Payer: Self-pay | Admitting: Emergency Medicine

## 2011-03-01 ENCOUNTER — Encounter: Payer: Self-pay | Admitting: *Deleted

## 2011-03-01 ENCOUNTER — Telehealth: Payer: Self-pay | Admitting: Critical Care Medicine

## 2011-03-01 MED ORDER — TIOTROPIUM BROMIDE MONOHYDRATE 18 MCG IN CAPS: 18.0000 ug | ORAL_CAPSULE | Freq: Every day | RESPIRATORY_TRACT | Status: DC

## 2011-03-01 NOTE — Telephone Encounter (Signed)
Refill sent. Pt aware.Arion Shankles, CMA  

## 2011-03-08 ENCOUNTER — Other Ambulatory Visit: Payer: Self-pay | Admitting: Family Medicine

## 2011-03-08 MED ORDER — BUDESONIDE 32 MCG/ACT NA SUSP: 2.0000 | Freq: Every day | NASAL | Status: DC

## 2011-03-08 NOTE — Telephone Encounter (Signed)
Done

## 2011-03-17 ENCOUNTER — Other Ambulatory Visit: Payer: Self-pay | Admitting: Emergency Medicine

## 2011-03-23 ENCOUNTER — Ambulatory Visit: Payer: Medicare HMO | Admitting: Critical Care Medicine

## 2011-04-01 ENCOUNTER — Encounter: Payer: Self-pay | Admitting: Emergency Medicine

## 2011-04-01 ENCOUNTER — Ambulatory Visit (INDEPENDENT_AMBULATORY_CARE_PROVIDER_SITE_OTHER): Payer: Medicare HMO | Admitting: Emergency Medicine

## 2011-04-01 DIAGNOSIS — J479 Bronchiectasis, uncomplicated: Secondary | ICD-10-CM

## 2011-04-01 DIAGNOSIS — J310 Chronic rhinitis: Secondary | ICD-10-CM

## 2011-04-01 NOTE — Progress Notes (Signed)
Ms. Gang is a 76 year old woman with a history of mild bronchiectasis and associated mild airflow limitation. She also has allergic rhinitis and postnasal drip. Taking fexofenadine and Rhinocort. Also on Spiriva + QVAR.   ROV 01/28/10 -- returns for f/u of her allergies, bronchiectasis, AFL. Has been seen by Dr Maple Hudson for allergy testing, skin testing negative. Nasal gtt is bothersome when she is outside and at night, white mucous. She is planning to have repeat skin testing Sept 1. Remains on QVAR and Spiriva. Also on fexafenadine + Rhinocort.   ROV 07/29/10 -- f/u for bronchiectasis, mild AFL, chronic rhinitis. She has been seen by Dr Maple Hudson, has negative skin testing since last visit. Has been on loratadine, allegra/fexofenadine, clarinex in the past. She is currently on name brand Allegra, causes her mouth and nose to be more dry, more nose bleeding. Cough stable. She feels more tired on the Allegra. Still on QVAR + Spiriva - although not clear that she has significant AFL, she believes she benefits.   ROV 04/01/11 -- bronchiectasis, mild AFL, chronic rhinitis with negative allergy w/u. She is on QVAR + Spiriva - although not clear that she has significant AFL, she believes she benefits. She was just at the beach - made her nasal gtt much worse. She remains on Rhinocort, but is unable to take allegra, loratadine, etc. She states that her DOE is slightly worse.   Gen: Pleasant, well-nourished, in no distress,  normal affect  ENT: No lesions,  mouth clear,  oropharynx clear, no postnasal drip, hoarse voice  Neck: No JVD, no TMG, no carotid bruits  Lungs: No use of accessory muscles, few insp crackles L base  Cardiovascular: RRR, heart sounds normal, no murmur or gallops, no peripheral edema  Musculoskeletal: No deformities, no cyanosis or clubbing  Neuro: alert, non focal  Skin: Warm, no lesions or rashes   BRONCHIECTASIS Continues to believe that she benefits from Spiriva + QVAR, will plan to  continue these.   RHINITIS Continue Rhinocort

## 2011-04-01 NOTE — Assessment & Plan Note (Signed)
Continue Rhinocort

## 2011-04-01 NOTE — Assessment & Plan Note (Signed)
Continues to believe that she benefits from Spiriva + QVAR, will plan to continue these.

## 2011-04-01 NOTE — Patient Instructions (Signed)
We will continue your QVAR and Spiriva as you have been taking them Continue your Rhinocort Follow up with Dr Delton Coombes in 6 months or sooner if you have any problems.

## 2011-04-16 ENCOUNTER — Other Ambulatory Visit: Payer: Self-pay | Admitting: Emergency Medicine

## 2011-04-20 ENCOUNTER — Telehealth: Payer: Self-pay | Admitting: Emergency Medicine

## 2011-04-20 MED ORDER — BECLOMETHASONE DIPROPIONATE 80 MCG/ACT IN AERS: 2.0000 | INHALATION_SPRAY | Freq: Two times a day (BID) | RESPIRATORY_TRACT | Status: DC

## 2011-04-20 NOTE — Telephone Encounter (Signed)
Pt aware qvar was sent to pharmacy. Nothing further was needed

## 2011-05-03 ENCOUNTER — Other Ambulatory Visit: Payer: Self-pay | Admitting: Family Medicine

## 2011-05-03 NOTE — Telephone Encounter (Signed)
Last seen 11/04/10 and filled 05/25/10 please advise    KP

## 2011-05-03 NOTE — Telephone Encounter (Signed)
Please discuss with pt since she has not filled since October

## 2011-05-04 ENCOUNTER — Other Ambulatory Visit: Payer: Self-pay | Admitting: Family Medicine

## 2011-05-04 MED ORDER — ESCITALOPRAM OXALATE 10 MG PO TABS: 10.0000 mg | ORAL_TABLET | Freq: Every day | ORAL | Status: DC

## 2011-05-04 NOTE — Telephone Encounter (Signed)
Ok to refill x 2  

## 2011-05-04 NOTE — Telephone Encounter (Signed)
Discussed with patient and it looks like 2 scripts written on the 05/25/10 one had 10 refills and the other did not have any refills and she stated she used the one that had the refills and now she does not have anymore refills on it.  CPX is scheduled or the 19. Please advise     KP

## 2011-05-04 NOTE — Telephone Encounter (Signed)
Faxed.   KP 

## 2011-05-05 ENCOUNTER — Other Ambulatory Visit: Payer: Self-pay | Admitting: Family Medicine

## 2011-05-05 MED ORDER — ESCITALOPRAM OXALATE 10 MG PO TABS: 10.0000 mg | ORAL_TABLET | Freq: Every day | ORAL | Status: DC

## 2011-05-05 NOTE — Telephone Encounter (Signed)
Refaxed   KP 

## 2011-05-05 NOTE — Telephone Encounter (Signed)
Refaxed    Kp

## 2011-05-12 ENCOUNTER — Encounter: Payer: Self-pay | Admitting: Family Medicine

## 2011-05-12 ENCOUNTER — Ambulatory Visit (INDEPENDENT_AMBULATORY_CARE_PROVIDER_SITE_OTHER): Payer: Medicare HMO | Admitting: Family Medicine

## 2011-05-12 VITALS — BP 116/84 | HR 76 | Temp 98.1°F | Ht 59.5 in | Wt 139.4 lb

## 2011-05-12 DIAGNOSIS — J302 Other seasonal allergic rhinitis: Secondary | ICD-10-CM

## 2011-05-12 DIAGNOSIS — E785 Hyperlipidemia, unspecified: Secondary | ICD-10-CM

## 2011-05-12 DIAGNOSIS — Z23 Encounter for immunization: Secondary | ICD-10-CM

## 2011-05-12 DIAGNOSIS — N390 Urinary tract infection, site not specified: Secondary | ICD-10-CM

## 2011-05-12 DIAGNOSIS — Z0000 Encounter for general adult medical examination without abnormal findings: Secondary | ICD-10-CM

## 2011-05-12 DIAGNOSIS — G4700 Insomnia, unspecified: Secondary | ICD-10-CM

## 2011-05-12 DIAGNOSIS — K219 Gastro-esophageal reflux disease without esophagitis: Secondary | ICD-10-CM

## 2011-05-12 LAB — CBC WITH DIFFERENTIAL/PLATELET
Eosinophils Relative: 2.2 % (ref 0.0–5.0)
HCT: 39.4 % (ref 36.0–46.0)
Hemoglobin: 13.1 g/dL (ref 12.0–15.0)
Lymphs Abs: 1.8 10*3/uL (ref 0.7–4.0)
MCV: 89 fl (ref 78.0–100.0)
Monocytes Absolute: 0.8 10*3/uL (ref 0.1–1.0)
Monocytes Relative: 8.9 % (ref 3.0–12.0)
Neutro Abs: 5.7 10*3/uL (ref 1.4–7.7)
WBC: 8.4 10*3/uL (ref 4.5–10.5)

## 2011-05-12 LAB — HEPATIC FUNCTION PANEL
ALT: 34 U/L (ref 0–35)
AST: 27 U/L (ref 0–37)
Albumin: 4.1 g/dL (ref 3.5–5.2)
Total Protein: 7.5 g/dL (ref 6.0–8.3)

## 2011-05-12 LAB — POCT URINALYSIS DIPSTICK
Bilirubin, UA: NEGATIVE
Glucose, UA: NEGATIVE
Ketones, UA: NEGATIVE
Spec Grav, UA: 1.005
Urobilinogen, UA: 0.2

## 2011-05-12 LAB — BASIC METABOLIC PANEL
BUN: 18 mg/dL (ref 6–23)
Chloride: 101 mEq/L (ref 96–112)
GFR: 84.16 mL/min (ref >60.00)
Glucose, Bld: 88 mg/dL (ref 70–99)
Potassium: 4.4 mEq/L (ref 3.5–5.1)
Sodium: 133 mEq/L — ABNORMAL LOW (ref 135–145)

## 2011-05-12 LAB — LIPID PANEL
Cholesterol: 110 mg/dL (ref 0–200)
LDL Cholesterol: 50 mg/dL (ref 0–99)
Triglycerides: 92 mg/dL (ref 0.0–149.0)

## 2011-05-12 MED ORDER — TRAZODONE HCL 100 MG PO TABS: ORAL_TABLET | ORAL | Status: DC

## 2011-05-12 MED ORDER — ATORVASTATIN CALCIUM 10 MG PO TABS: 10.0000 mg | ORAL_TABLET | Freq: Every day | ORAL | Status: DC

## 2011-05-12 MED ORDER — ESOMEPRAZOLE MAGNESIUM 40 MG PO CPDR: 40.0000 mg | DELAYED_RELEASE_CAPSULE | Freq: Every day | ORAL | Status: DC

## 2011-05-12 MED ORDER — ZOSTER VACCINE LIVE 19400 UNT/0.65ML ~~LOC~~ SOLR: 0.6500 mL | Freq: Once | SUBCUTANEOUS | Status: DC

## 2011-05-12 MED ORDER — BUDESONIDE 32 MCG/ACT NA SUSP: NASAL | Status: DC

## 2011-05-12 NOTE — Progress Notes (Signed)
Subjective:    [name] is a 76 y.o. female who presents for Medicare Annual/Subsequent preventive examination.  Preventive Screening-Counseling & Management  Tobacco History  Smoking status  . Never Smoker   Smokeless tobacco  . Not on file     Problems Prior to Visit 1.   Current Problems (verified) Patient Active Problem List  Diagnoses  . SHINGLES  . EOSINOPHILIA  . DEPRESSION  . CARDIOMYOPATHY  . SINOATRIAL NODE DYSFUNCTION  . ATRIAL ARRHYTHMIAS  . DISEASE, TAKOTSUBO SYNDROME  . Acute Sinusitis, Unspecified  . RHINITIS  . ASTHMA  . BRONCHIECTASIS  . GERD  . PEPTIC ULCER DISEASE  . CONSTIPATION  . UTI  . INGROWN HAIR  . OSTEOARTHRITIS  . OSTEOPOROSIS  . WEIGHT GAIN  . DYSPNEA  . DYSPNEA ON EXERTION  . COUGH  . OTHER ABNORMALITY OF URINATION  . ANA POSITIVE  . CERVICAL STRAIN  . ECHOCARDIOGRAM, HX OF  . HERPES ZOSTER, HX OF  . PACEMAKER, PERMANENT  . HYPERLIPIDEMIA  . HIP PAIN, LEFT    Medications Prior to Visit Current Outpatient Prescriptions on File Prior to Visit  Medication Sig Dispense Refill  . Ascorbic Acid (VITAMIN C) 1000 MG tablet Take 1,000 mg by mouth daily.        [name] aspirin 81 MG EC tablet Take 81 mg by mouth daily.        [name] atorvastatin (LIPITOR) 10 MG tablet Take 10 mg by mouth at bedtime.        . beclomethasone (QVAR) 80 MCG/ACT inhaler Inhale 2 puffs into the lungs 2 (two) times daily.  1 Inhaler  3  . Calcium Carbonate (CALCIUM 600) 1500 MG TABS Take 1 tablet by mouth daily.        . Cholecalciferol (VITAMIN D3) 1000 UNITS CAPS Take 1 capsule by mouth daily.        [name] esomeprazole (NEXIUM) 40 MG capsule Take 40 mg by mouth daily before breakfast.        . fexofenadine (ALLEGRA) 180 MG tablet Take 180 mg by mouth daily.        [name] LEXAPRO 10 MG tablet TAKE 1 TABLET EVERY DAY  30 tablet  2  . Multiple Vitamin (MULTIVITAMIN) tablet Take 1 tablet by mouth daily.        [name] RHINOCORT AQUA 32 MCG/ACT nasal spray USE AS DIRECTED ONCE  A DAY  1 Bottle  1  . tiotropium (SPIRIVA HANDIHALER) 18 MCG inhalation capsule Place 1 capsule (18 mcg total) into inhaler and inhale daily.  30 capsule  3  . traZODone (DESYREL) 100 MG tablet TAKE 2 TABLETS AT BEDTIME  60 tablet  2  . zoster vaccine live, PF, (ZOSTAVAX) 16109 UNT/0.65ML injection Inject 0.65 mLs into the skin once.          Current Medications (verified) Current Outpatient Prescriptions  Medication Sig Dispense Refill  . Ascorbic Acid (VITAMIN C) 1000 MG tablet Take 1,000 mg by mouth daily.        [name] aspirin 81 MG EC tablet Take 81 mg by mouth daily.        [name] atorvastatin (LIPITOR) 10 MG tablet Take 10 mg by mouth at bedtime.        . beclomethasone (QVAR) 80 MCG/ACT inhaler Inhale 2 puffs into the lungs 2 (two) times daily.  1 Inhaler  3  . Calcium Carbonate (CALCIUM 600) 1500 MG TABS Take 1 tablet by mouth daily.        [name]  Cholecalciferol (VITAMIN D3) 1000 UNITS CAPS Take 1 capsule by mouth daily.        [name] esomeprazole (NEXIUM) 40 MG capsule Take 40 mg by mouth daily before breakfast.        . fexofenadine (ALLEGRA) 180 MG tablet Take 180 mg by mouth daily.        [name] LEXAPRO 10 MG tablet TAKE 1 TABLET EVERY DAY  30 tablet  2  . Multiple Vitamin (MULTIVITAMIN) tablet Take 1 tablet by mouth daily.        [name] RHINOCORT AQUA 32 MCG/ACT nasal spray USE AS DIRECTED ONCE A DAY  1 Bottle  1  . tiotropium (SPIRIVA HANDIHALER) 18 MCG inhalation capsule Place 1 capsule (18 mcg total) into inhaler and inhale daily.  30 capsule  3  . traZODone (DESYREL) 100 MG tablet TAKE 2 TABLETS AT BEDTIME  60 tablet  2  . zoster vaccine live, PF, (ZOSTAVAX) 16109 UNT/0.65ML injection Inject 0.65 mLs into the skin once.           Allergies (verified) Benzoyl peroxide and Triple antibiotic   PAST HISTORY  Family History Family History  Problem Relation Age of Onset  . Coronary artery disease      femal 1st degree relative  . Liver cancer Son   . Emphysema Sister   . Asthma Mother   .  Depression Father     nervous breakdown    Social History History  Substance Use Topics  . Smoking status: Never Smoker   . Smokeless tobacco: Not on file  . Alcohol Use: No     Are there smokers in your home (other than you)? No  Risk Factors Current exercise habits: The patient does not participate in regular exercise at present.  Dietary issues discussed: na   Cardiac risk factors: advanced age (older than 13 for men, 36 for women), dyslipidemia and sedentary lifestyle.  Depression Screen (Note: if answer to either of the following is "Yes", a more complete depression screening is indicated)   Over the past two weeks, have you felt down, depressed or hopeless? No  Over the past two weeks, have you felt little interest or pleasure in doing things? No  Have you lost interest or pleasure in daily life? No  Do you often feel hopeless? No  Do you cry easily over simple problems? No  Activities of Daily Living In your present state of health, do you have any difficulty performing the following activities?:  Driving? Yes Managing money?  No Feeding yourself? No Getting from bed to chair?no Climbing a flight of stairs? No Preparing food and eating?: No Bathing or showering? No Getting dressed: No Getting to the toilet? No Using the toilet:No Moving around from place to place: No In the past year have you fallen or had a near fall?:No   Are you sexually active?  Yes  Do you have more than one partner?  No  Hearing Difficulties: No Do you often ask people to speak up or repeat themselves? No Do you experience ringing or noises in your ears? No Do you have difficulty understanding soft or whispered voices? No   Do you feel that you have a problem with memory? No  Do you often misplace items? No  Do you feel safe at home?  Yes  Cognitive Testing  Alert? Yes  Normal Appearance?Yes  Oriented to person? Yes  Place? Yes   Time? Yes  Recall of three objects?  Yes  Can  perform  simple calculations? Yes  Displays appropriate judgment?Yes  Can read the correct time from a watch face?Yes   Advanced Directives have been discussed with the patient? Yes  List the Names of Other Physician/Practitioners you currently use: 1.  pulm-- Ortencia Kick 2 cardio --Graciela Husbands 3- GI  -- buccini Indicate any recent Medical Services you may have received from other than [facility] providers in the past year (date may be approximate).  Immunization History  Administered Date(s) Administered  . H1N1 09/05/2008  . Influenza Whole 06/27/2007, 05/29/2008, 05/23/2009, 05/05/2010  . Pneumococcal Polysaccharide 05/23/2007    Screening Tests Health Maintenance  Topic Date Due  . Tetanus/tdap  05/17/1956  . Colonoscopy  05/18/1987  . Zostavax  05/17/1997  . Influenza Vaccine  05/24/2011  . Pneumococcal Polysaccharide Vaccine Age 76 And Over  Completed    All answers were reviewed with the patient and necessary referrals were made:  Loreen Freud, DO   05/12/2011   History reviewed: allergies, current medications, past family history, past medical history, past social history, past surgical history and problem list  Review of Systems  Review of Systems  Constitutional: Negative for activity change, appetite change and fatigue.  HENT: Negative for hearing loss, congestion, tinnitus and ear discharge.  dentist q85m Eyes: Negative for visual disturbance (see optho q1y -- vision corrected to 20/20 with glasses).  Respiratory: Negative for cough, chest tightness and shortness of breath.   Cardiovascular: Negative for chest pain, palpitations and leg swelling.  Gastrointestinal: Negative for abdominal pain, diarrhea, constipation and abdominal distention.  Genitourinary: Negative for urgency, frequency, decreased urine volume and difficulty urinating.  Musculoskeletal: Negative for back pain, arthralgias and gait problem.  Skin: Negative for color change, pallor and rash.  Neurological: Negative  for dizziness, light-headedness, numbness and headaches.  Hematological: Negative for adenopathy. Does not bruise/bleed easily.  Psychiatric/Behavioral: Negative for suicidal ideas, confusion, sleep disturbance, self-injury, dysphoric mood, decreased concentration and agitation.  Pt is able to read and write and can do all ADLs No risk for falling No abuse/ violence in home      Objective:     Vision by Snellen chart:  ZOX:WRUE to opth.,   Body mass index is 27.68 kg/(m^2). BP 116/84  Pulse 76  Temp(Src) 98.1 F (36.7 C) (Oral)  Ht 4' 11.5" (1.511 m)  Wt 139 lb 6.4 oz (63.231 kg)  BMI 27.68 kg/m2  SpO2 96%  BP 116/84  Pulse 76  Temp(Src) 98.1 F (36.7 C) (Oral)  Ht 4' 11.5" (1.511 m)  Wt 139 lb 6.4 oz (63.231 kg)  BMI 27.68 kg/m2  SpO2 96% General appearance: alert, cooperative, appears stated age and no distress Head: Normocephalic, without obvious abnormality, atraumatic Eyes: conjunctivae/corneas clear. PERRL, EOM&#39;s intact. Fundi benign. Ears: normal TM's and external ear canals both ears Nose: Nares normal. Septum midline. Mucosa normal. No drainage or sinus tenderness. Throat: lips, mucosa, and tongue normal; teeth and gums normal Neck: no adenopathy, no carotid bruit, no JVD, supple, symmetrical, trachea midline and thyroid not enlarged, symmetric, no tenderness/mass/nodules Lungs: clear to auscultation bilaterally Breasts: normal appearance, no masses or tenderness Heart: regular rate and rhythm, S1, S2 normal, no murmur, click, rub or gallop Abdomen: soft, non-tender; bowel sounds normal; no masses,  no organomegaly Extremities: extremities normal, atraumatic, no cyanosis or edema Pulses: 2+ and symmetric Skin: Skin color, texture, turgor normal. No rashes or lesions Lymph nodes: Cervical, supraclavicular, and axillary nodes normal. Neurologic: Alert and oriented X 3, normal strength and tone. Normal symmetric reflexes. Normal coordination  and gait       Assessment:  1. Preventative care 2 hyperlipidemia 3. asthma       Plan:     During the course of the visit the patient was educated and counseled about appropriate screening and preventive services including:    Td vaccine  Screening electrocardiogram  Screening mammography  Bone densitometry screening  Colorectal cancer screening  Advanced directives: has NO advanced directive - not interested in additional information  Diet review for nutrition referral? Yes ____  Not Indicated __x__   Patient Instructions (the written plan) was given to the patient.  Medicare Attestation I have personally reviewed: The patient's medical and social history Their use of alcohol, tobacco or illicit drugs Their current medications and supplements The patient's functional ability including ADLs,fall risks, home safety risks, cognitive, and hearing and visual impairment Diet and physical activities Evidence for depression or mood disorders  The patient's weight, height, BMI, and visual acuity have been recorded in the chart.  I have made referrals, counseling, and provided education to the patient based on review of the above and I have provided the patient with a written personalized care plan for preventive services.     Loreen Freud, DO   05/12/2011

## 2011-05-12 NOTE — Patient Instructions (Signed)

## 2011-05-12 NOTE — Progress Notes (Signed)
Addended by: Arnette Norris on: 05/12/2011 12:10 PM   Modules accepted: Orders

## 2011-05-15 LAB — URINE CULTURE: Colony Count: >=100000

## 2011-05-19 ENCOUNTER — Telehealth: Payer: Self-pay

## 2011-05-19 MED ORDER — CIPROFLOXACIN HCL 500 MG PO TABS: 500.0000 mg | ORAL_TABLET | Freq: Two times a day (BID) | ORAL | Status: AC

## 2011-05-19 NOTE — Telephone Encounter (Signed)
Discussed with patient and Rx faxed   KP 

## 2011-05-19 NOTE — Telephone Encounter (Signed)
Message copied by Arnette Norris on Wed May 19, 2011  8:29 AM ------      Message from: Lelon Perla      Created: Sat May 15, 2011  9:04 PM       + uti--- cipro 500 mg 1 po bid for 5 days

## 2011-05-23 ENCOUNTER — Other Ambulatory Visit: Payer: Self-pay | Admitting: Family Medicine

## 2011-05-27 ENCOUNTER — Ambulatory Visit (INDEPENDENT_AMBULATORY_CARE_PROVIDER_SITE_OTHER): Payer: Medicare HMO | Admitting: *Deleted

## 2011-05-27 ENCOUNTER — Encounter: Payer: Self-pay | Admitting: Internal Medicine

## 2011-05-27 ENCOUNTER — Other Ambulatory Visit: Payer: Self-pay | Admitting: Internal Medicine

## 2011-05-27 DIAGNOSIS — I498 Other specified cardiac arrhythmias: Secondary | ICD-10-CM

## 2011-05-27 DIAGNOSIS — Z950 Presence of cardiac pacemaker: Secondary | ICD-10-CM

## 2011-05-30 LAB — REMOTE PACEMAKER DEVICE
AL IMPEDENCE PM: 437 Ohm
ATRIAL PACING PM: 97
BATTERY VOLTAGE: 2.77 V
RV LEAD IMPEDENCE PM: 468 Ohm
VENTRICULAR PACING PM: 99

## 2011-05-31 ENCOUNTER — Other Ambulatory Visit: Payer: Self-pay | Admitting: Family Medicine

## 2011-05-31 DIAGNOSIS — K219 Gastro-esophageal reflux disease without esophagitis: Secondary | ICD-10-CM

## 2011-05-31 MED ORDER — ESOMEPRAZOLE MAGNESIUM 40 MG PO CPDR: 40.0000 mg | DELAYED_RELEASE_CAPSULE | Freq: Every day | ORAL | Status: DC

## 2011-05-31 NOTE — Telephone Encounter (Signed)
Rx re-faxed    KP 

## 2011-06-07 NOTE — Progress Notes (Signed)
Pacer remote check  

## 2011-06-14 ENCOUNTER — Other Ambulatory Visit: Payer: Self-pay

## 2011-06-14 DIAGNOSIS — G4700 Insomnia, unspecified: Secondary | ICD-10-CM

## 2011-06-14 MED ORDER — TRAZODONE HCL 100 MG PO TABS: ORAL_TABLET | ORAL | Status: DC

## 2011-06-14 NOTE — Telephone Encounter (Signed)
Call from patient and she stated when she was in for her office visit on 05/12/11 that she was only given the Trazodone 100 po qhs and she takes 200 mg. Patient would like to get he Rx changed back to 2 po qd. Please advise     KP

## 2011-06-16 ENCOUNTER — Other Ambulatory Visit: Payer: Self-pay | Admitting: Family Medicine

## 2011-06-16 DIAGNOSIS — Z1231 Encounter for screening mammogram for malignant neoplasm of breast: Secondary | ICD-10-CM

## 2011-06-21 ENCOUNTER — Ambulatory Visit (INDEPENDENT_AMBULATORY_CARE_PROVIDER_SITE_OTHER): Payer: Medicare HMO | Admitting: Family Medicine

## 2011-06-21 ENCOUNTER — Encounter: Payer: Self-pay | Admitting: Family Medicine

## 2011-06-21 VITALS — BP 120/68 | HR 76 | Temp 97.0°F | Wt 139.4 lb

## 2011-06-21 DIAGNOSIS — H6120 Impacted cerumen, unspecified ear: Secondary | ICD-10-CM

## 2011-06-21 DIAGNOSIS — G4700 Insomnia, unspecified: Secondary | ICD-10-CM | POA: Insufficient documentation

## 2011-06-21 MED ORDER — TRAZODONE HCL 100 MG PO TABS: ORAL_TABLET | ORAL | Status: DC

## 2011-06-21 NOTE — Patient Instructions (Signed)
Cerumen Impaction A cerumen impaction is when the wax in your ear forms a plug. This plug usually causes reduced hearing. Sometimes it also causes an earache or dizziness. Removing a cerumen impaction can be difficult and painful. The wax sticks to the ear canal. The canal is sensitive and bleeds easily. If you try to remove a heavy wax buildup with a cotton tipped swab, you may push it in further. Irrigation with water, suction, and small ear curettes may be used to clear out the wax. If the impaction is fixed to the skin in the ear canal, ear drops may be needed for a few days to loosen the wax. People who build up a lot of wax frequently can use ear wax removal products available in your local drugstore. SEEK MEDICAL CARE IF:  You develop an earache, increased hearing loss, or marked dizziness. Document Released: 09/16/2004 Document Revised: 04/21/2011 Document Reviewed: 11/06/2009 [employer] Patient Information 2012 [employer]. 

## 2011-06-21 NOTE — Assessment & Plan Note (Signed)
rx corrected to trazadone 100mg   2po qhs prn

## 2011-06-21 NOTE — Progress Notes (Signed)
  Subjective:    Patient ID: [name], female    DOB: Feb 07, 1935, 76 y.o.   MRN: [id number]  HPI Pt here c/o ears feeling full and there was a mistake on her trazadone.  She has only been taking one because new rx said 1 and it is not working.  No other complaints.   Review of Systems    as above Objective:   Physical Exam  Constitutional: She is oriented to person, place, and time. She appears well-developed and well-nourished.  HENT:       B/L cerumen impaction----pt sat for several minutes with ears soaking with colace and then ears were irrigated successfully but pt in office 90 min in order to get ears cleaned out  Neurological: She is alert and oriented to person, place, and time.  Psychiatric: She has a normal mood and affect. Her behavior is normal. Judgment and thought content normal.          Assessment & Plan:

## 2011-06-21 NOTE — Assessment & Plan Note (Signed)
Irrigated successfully con't to use debrox prn rto prn

## 2011-07-06 ENCOUNTER — Other Ambulatory Visit: Payer: Self-pay | Admitting: Emergency Medicine

## 2011-07-12 ENCOUNTER — Inpatient Hospital Stay (HOSPITAL_COMMUNITY)
Admission: EM | Admit: 2011-07-12 | Discharge: 2011-07-14 | DRG: 815 | Disposition: A | Discharge status: Home / Self Care | Payer: Medicare PPO | Attending: Internal Medicine | Admitting: Internal Medicine

## 2011-07-12 ENCOUNTER — Other Ambulatory Visit: Payer: Self-pay

## 2011-07-12 ENCOUNTER — Ambulatory Visit (INDEPENDENT_AMBULATORY_CARE_PROVIDER_SITE_OTHER): Payer: Medicare HMO | Admitting: Family Medicine

## 2011-07-12 ENCOUNTER — Emergency Department (HOSPITAL_COMMUNITY): Payer: Medicare PPO

## 2011-07-12 ENCOUNTER — Encounter (HOSPITAL_COMMUNITY): Payer: Self-pay | Admitting: Nurse Practitioner

## 2011-07-12 ENCOUNTER — Encounter: Payer: Self-pay | Admitting: Family Medicine

## 2011-07-12 DIAGNOSIS — I495 Sick sinus syndrome: Secondary | ICD-10-CM | POA: Diagnosis present

## 2011-07-12 DIAGNOSIS — D735 Infarction of spleen: Secondary | ICD-10-CM

## 2011-07-12 DIAGNOSIS — M810 Age-related osteoporosis without current pathological fracture: Secondary | ICD-10-CM | POA: Diagnosis present

## 2011-07-12 DIAGNOSIS — E785 Hyperlipidemia, unspecified: Secondary | ICD-10-CM | POA: Diagnosis present

## 2011-07-12 DIAGNOSIS — D7389 Other diseases of spleen: Principal | ICD-10-CM | POA: Diagnosis present

## 2011-07-12 DIAGNOSIS — Z800 Family history of malignant neoplasm of digestive organs: Secondary | ICD-10-CM

## 2011-07-12 DIAGNOSIS — Z8249 Family history of ischemic heart disease and other diseases of the circulatory system: Secondary | ICD-10-CM

## 2011-07-12 DIAGNOSIS — J309 Allergic rhinitis, unspecified: Secondary | ICD-10-CM | POA: Diagnosis present

## 2011-07-12 DIAGNOSIS — I4891 Unspecified atrial fibrillation: Secondary | ICD-10-CM | POA: Diagnosis present

## 2011-07-12 DIAGNOSIS — I428 Other cardiomyopathies: Secondary | ICD-10-CM | POA: Diagnosis present

## 2011-07-12 DIAGNOSIS — G4700 Insomnia, unspecified: Secondary | ICD-10-CM | POA: Diagnosis present

## 2011-07-12 DIAGNOSIS — R0602 Shortness of breath: Secondary | ICD-10-CM

## 2011-07-12 DIAGNOSIS — J302 Other seasonal allergic rhinitis: Secondary | ICD-10-CM

## 2011-07-12 DIAGNOSIS — I480 Paroxysmal atrial fibrillation: Secondary | ICD-10-CM | POA: Diagnosis present

## 2011-07-12 DIAGNOSIS — R079 Chest pain, unspecified: Secondary | ICD-10-CM | POA: Diagnosis present

## 2011-07-12 DIAGNOSIS — K219 Gastro-esophageal reflux disease without esophagitis: Secondary | ICD-10-CM

## 2011-07-12 DIAGNOSIS — E871 Hypo-osmolality and hyponatremia: Secondary | ICD-10-CM | POA: Diagnosis present

## 2011-07-12 DIAGNOSIS — Z825 Family history of asthma and other chronic lower respiratory diseases: Secondary | ICD-10-CM

## 2011-07-12 DIAGNOSIS — Z7982 Long term (current) use of aspirin: Secondary | ICD-10-CM

## 2011-07-12 DIAGNOSIS — Z7901 Long term (current) use of anticoagulants: Secondary | ICD-10-CM

## 2011-07-12 DIAGNOSIS — Z950 Presence of cardiac pacemaker: Secondary | ICD-10-CM

## 2011-07-12 DIAGNOSIS — Z79899 Other long term (current) drug therapy: Secondary | ICD-10-CM

## 2011-07-12 DIAGNOSIS — R0902 Hypoxemia: Secondary | ICD-10-CM | POA: Diagnosis present

## 2011-07-12 DIAGNOSIS — J479 Bronchiectasis, uncomplicated: Secondary | ICD-10-CM | POA: Diagnosis present

## 2011-07-12 DIAGNOSIS — Z8711 Personal history of peptic ulcer disease: Secondary | ICD-10-CM

## 2011-07-12 HISTORY — DX: Eosinophilia: D72.1

## 2011-07-12 HISTORY — DX: Zoster without complications: B02.9

## 2011-07-12 HISTORY — DX: Eosinophilia, unspecified: D72.10

## 2011-07-12 HISTORY — DX: Hyperlipidemia, unspecified: E78.5

## 2011-07-12 HISTORY — DX: Sick sinus syndrome: I49.5

## 2011-07-12 HISTORY — DX: Gastro-esophageal reflux disease without esophagitis: K21.9

## 2011-07-12 LAB — DIFFERENTIAL
Basophils Relative: 0 % (ref 0–1)
Eosinophils Absolute: 0.2 10*3/uL (ref 0.0–0.7)
Neutrophils Relative %: 73 % (ref 43–77)

## 2011-07-12 LAB — COMPREHENSIVE METABOLIC PANEL
ALT: 34 U/L (ref 0–35)
AST: 25 U/L (ref 0–37)
Albumin: 3.6 g/dL (ref 3.5–5.2)
Alkaline Phosphatase: 101 U/L (ref 39–117)
Potassium: 4.7 mEq/L (ref 3.5–5.1)
Sodium: 130 mEq/L — ABNORMAL LOW (ref 135–145)
Total Protein: 7.7 g/dL (ref 6.0–8.3)

## 2011-07-12 LAB — CBC
MCH: 29.3 pg (ref 26.0–34.0)
MCHC: 33.9 g/dL (ref 30.0–36.0)
Platelets: 326 10*3/uL (ref 150–400)
RBC: 4.5 MIL/uL (ref 3.87–5.11)

## 2011-07-12 LAB — D-DIMER, QUANTITATIVE: D-Dimer, Quant: 2.69 ug/mL-FEU — ABNORMAL HIGH (ref 0.00–0.48)

## 2011-07-12 MED ORDER — ENOXAPARIN SODIUM 80 MG/0.8ML ~~LOC~~ SOLN
1.0000 mg/kg | Freq: Two times a day (BID) | SUBCUTANEOUS | Status: DC
  Filled 2011-07-12 (×4): qty 0.8

## 2011-07-12 MED ORDER — FLUTICASONE PROPIONATE HFA 44 MCG/ACT IN AERO
2.0000 | INHALATION_SPRAY | Freq: Two times a day (BID) | RESPIRATORY_TRACT | Status: DC
  Administered 2011-07-13 – 2011-07-14 (×3): 2 via RESPIRATORY_TRACT
  Filled 2011-07-12: qty 10.6

## 2011-07-12 MED ORDER — VITAMIN C 500 MG PO TABS
1000.0000 mg | ORAL_TABLET | Freq: Every day | ORAL | Status: DC
  Administered 2011-07-13 – 2011-07-14 (×2): 1000 mg via ORAL
  Filled 2011-07-12 (×2): qty 2

## 2011-07-12 MED ORDER — ACETAMINOPHEN 650 MG RE SUPP: 650.0000 mg | Freq: Four times a day (QID) | RECTAL | Status: DC | PRN

## 2011-07-12 MED ORDER — ASPIRIN 81 MG PO CHEW
81.0000 mg | CHEWABLE_TABLET | Freq: Every day | ORAL | Status: DC
  Administered 2011-07-13 – 2011-07-14 (×2): 81 mg via ORAL
  Filled 2011-07-12 (×2): qty 1

## 2011-07-12 MED ORDER — ONDANSETRON HCL 4 MG PO TABS: 4.0000 mg | ORAL_TABLET | Freq: Four times a day (QID) | ORAL | Status: DC | PRN

## 2011-07-12 MED ORDER — FLUTICASONE PROPIONATE 50 MCG/ACT NA SUSP
2.0000 | Freq: Every day | NASAL | Status: DC
  Administered 2011-07-13 – 2011-07-14 (×2): 2 via NASAL
  Filled 2011-07-12: qty 16

## 2011-07-12 MED ORDER — ACETAMINOPHEN 325 MG PO TABS: 650.0000 mg | ORAL_TABLET | Freq: Four times a day (QID) | ORAL | Status: DC | PRN

## 2011-07-12 MED ORDER — TRAZODONE HCL 100 MG PO TABS
200.0000 mg | ORAL_TABLET | Freq: Every day | ORAL | Status: DC
  Administered 2011-07-13: 200 mg via ORAL
  Filled 2011-07-12 (×2): qty 2

## 2011-07-12 MED ORDER — LORATADINE 10 MG PO TABS
10.0000 mg | ORAL_TABLET | Freq: Every day | ORAL | Status: DC
  Administered 2011-07-13 – 2011-07-14 (×2): 10 mg via ORAL
  Filled 2011-07-12 (×2): qty 1

## 2011-07-12 MED ORDER — SODIUM CHLORIDE 0.9 % IV SOLN
INTRAVENOUS | Status: DC
  Administered 2011-07-12 – 2011-07-14 (×3): via INTRAVENOUS

## 2011-07-12 MED ORDER — ESCITALOPRAM OXALATE 10 MG PO TABS
10.0000 mg | ORAL_TABLET | Freq: Every day | ORAL | Status: DC
  Filled 2011-07-12: qty 1

## 2011-07-12 MED ORDER — PANTOPRAZOLE SODIUM 40 MG PO TBEC
40.0000 mg | DELAYED_RELEASE_TABLET | Freq: Every day | ORAL | Status: DC
  Administered 2011-07-13 – 2011-07-14 (×2): 40 mg via ORAL
  Filled 2011-07-12 (×2): qty 1

## 2011-07-12 MED ORDER — ONDANSETRON HCL 4 MG/2ML IJ SOLN: 4.0000 mg | Freq: Four times a day (QID) | INTRAMUSCULAR | Status: DC | PRN

## 2011-07-12 MED ORDER — THERA M PLUS PO TABS
1.0000 | ORAL_TABLET | Freq: Every day | ORAL | Status: DC
  Administered 2011-07-13 – 2011-07-14 (×2): 1 via ORAL
  Filled 2011-07-12 (×2): qty 1

## 2011-07-12 MED ORDER — SIMVASTATIN 20 MG PO TABS
20.0000 mg | ORAL_TABLET | Freq: Every day | ORAL | Status: DC
  Administered 2011-07-13 – 2011-07-14 (×2): 20 mg via ORAL
  Filled 2011-07-12 (×2): qty 1

## 2011-07-12 MED ORDER — IOHEXOL 300 MG/ML  SOLN
100.0000 mL | Freq: Once | INTRAMUSCULAR | Status: AC | PRN
  Administered 2011-07-12: 100 mL via INTRAVENOUS

## 2011-07-12 MED ORDER — HYDROMORPHONE HCL PF 1 MG/ML IJ SOLN: 0.5000 mg | INTRAMUSCULAR | Status: DC | PRN

## 2011-07-12 MED ORDER — ONE-DAILY MULTI VITAMINS PO TABS: 1.0000 | ORAL_TABLET | Freq: Every day | ORAL | Status: DC

## 2011-07-12 MED ORDER — CALCIUM CARBONATE 1250 (500 CA) MG PO TABS
1.0000 | ORAL_TABLET | Freq: Every day | ORAL | Status: DC
  Administered 2011-07-13 – 2011-07-14 (×2): 500 mg via ORAL
  Filled 2011-07-12 (×2): qty 1

## 2011-07-12 MED ORDER — ENOXAPARIN SODIUM 60 MG/0.6ML ~~LOC~~ SOLN
60.0000 mg | Freq: Once | SUBCUTANEOUS | Status: DC
  Filled 2011-07-12 (×2): qty 0.6

## 2011-07-12 MED ORDER — TIOTROPIUM BROMIDE MONOHYDRATE 18 MCG IN CAPS
18.0000 ug | ORAL_CAPSULE | Freq: Every day | RESPIRATORY_TRACT | Status: DC
  Administered 2011-07-13 – 2011-07-14 (×2): 18 ug via RESPIRATORY_TRACT
  Filled 2011-07-12: qty 5

## 2011-07-12 NOTE — ED Notes (Signed)
Representative from [employer] at bedside for interrogation of pt's pacemaker which  she  reports  Is functioning properly.  Representative spoke with physician regarding her findings.

## 2011-07-12 NOTE — ED Notes (Signed)
All pending orders have been resulted, awaiting available bed.

## 2011-07-12 NOTE — ED Notes (Signed)
Called floor to give report and spoke with

## 2011-07-12 NOTE — ED Provider Notes (Signed)
History     CSN: [id number] Arrival date & time: 07/12/2011  2:22 PM   First MD Initiated Contact with Patient 07/12/11 1505      Chief Complaint  Patient presents with  . Shortness of Breath    (Consider location/radiation/quality/duration/timing/severity/associated sxs/prior treatment) Patient is a 76 y.o. female presenting with shortness of breath. The history is provided by the patient (The patient states that she's been having left-sided chest pain for a number of days worse with inspiration she was seen over in her doctor's office today and they sent her here for evaluation no shortness of breath no sweating no dyspnea on exertion only ).  Shortness of Breath  The current episode started 3 to 5 days ago. The problem occurs frequently. The problem has been unchanged. The problem is moderate. The symptoms are relieved by nothing. Exacerbated by: inspiration. Associated symptoms include chest pain. Pertinent negatives include no chest pressure, no rhinorrhea, no cough and no shortness of breath. She has had no prior steroid use. Her past medical history does not include asthma. Urine output has been normal.    Past Medical History  Diagnosis Date  . Asthma   . Rhinitis   . Osteoarthritis   . Osteoporosis   . Peptic ulcer disease   . Depression   . Atrial fibrillation     s/p ablation   . Takotsubo cardiomyopathy     resolved  . Shingles   . Eosinophilia   . Cardiomyopathy   . Sinoatrial node dysfunction   . Atrial arrhythmia   . Sinusitis   . Rhinitis   . GERD (gastroesophageal reflux disease)   . Bronchiectasis   . Peptic ulcer disease   . Osteoarthritis   . Herpes zoster   . Hyperlipemia     Past Surgical History  Procedure Date  . Cholecystectomy   . Tonsillectomy   . Cataract extraction   . Pacemaker insertion 09/29/06    Family History  Problem Relation Age of Onset  . Coronary artery disease      femal 1st degree relative  . Liver cancer Son   .  Emphysema Sister   . Asthma Mother   . Depression Father     nervous breakdown    History  Substance Use Topics  . Smoking status: Never Smoker   . Smokeless tobacco: Not on file  . Alcohol Use: No    OB History    Grav Para Term Preterm Abortions TAB SAB Ect Mult Living                  Review of Systems  Constitutional: Negative for fatigue.  HENT: Negative for congestion, rhinorrhea, sinus pressure and ear discharge.   Eyes: Negative for discharge.  Respiratory: Negative for cough and shortness of breath.   Cardiovascular: Positive for chest pain.  Gastrointestinal: Negative for abdominal pain and diarrhea.  Genitourinary: Negative for frequency and hematuria.  Musculoskeletal: Negative for back pain.  Skin: Negative for rash.  Neurological: Negative for seizures and headaches.  Hematological: Negative.   Psychiatric/Behavioral: Negative for hallucinations.    Allergies  Benzoyl peroxide and Triple antibiotic  Home Medications   Current Outpatient Rx  Name Route Sig Dispense Refill  . VITAMIN C 1000 MG PO TABS Oral Take 1,000 mg by mouth daily.      . ASPIRIN 81 MG PO TBEC Oral Take 81 mg by mouth daily.      . ATORVASTATIN CALCIUM 10 MG PO TABS  TAKE 1  TABLET BY MOUTH AT BEDTIME 30 tablet 5  . BECLOMETHASONE DIPROPIONATE 80 MCG/ACT IN AERS Inhalation Inhale 2 puffs into the lungs 2 (two) times daily. 1 Inhaler 3  . BUDESONIDE 32 MCG/ACT NA SUSP  1 spray each nostril qd 1 Bottle 5  . CALCIUM CARBONATE 1500 MG PO TABS Oral Take 1 tablet by mouth daily.      [name] VITAMIN D3 1000 UNITS PO CAPS Oral Take 1 capsule by mouth daily.      [name] ESOMEPRAZOLE MAGNESIUM 40 MG PO CPDR Oral Take 1 capsule (40 mg total) by mouth daily before breakfast. 30 capsule 11  . FEXOFENADINE HCL 180 MG PO TABS Oral Take 180 mg by mouth daily.     [name] LEXAPRO 10 MG PO TABS  TAKE 1 TABLET EVERY DAY 30 tablet 2  . ONE-DAILY MULTI VITAMINS PO TABS Oral Take 1 tablet by mouth daily.      [name] SPIRIVA  HANDIHALER 18 MCG IN CAPS  PLACE 1 CAPSULE (18 MCG TOTAL) INTO INHALER AND INHALE DAILY. 30 each 3  . TRAZODONE HCL 100 MG PO TABS  2 po qhs 60 tablet 2    BP 178/84  Pulse 67  Temp(Src) 97.8 F (36.6 C) (Oral)  Resp 28  Ht 4\' 11"  (1.499 m)  Wt 137 lb (62.143 kg)  BMI 27.67 kg/m2  SpO2 97%  Physical Exam  Constitutional: She is oriented to person, place, and time. She appears well-developed.  HENT:  Head: Normocephalic and atraumatic.  Eyes: Conjunctivae and EOM are normal. No scleral icterus.  Neck: Neck supple. No thyromegaly present.  Cardiovascular: Normal rate and regular rhythm.  Exam reveals no gallop and no friction rub.   No murmur heard. Pulmonary/Chest: No stridor. She has no wheezes. She has no rales. She exhibits no tenderness.  Abdominal: She exhibits no distension. There is no tenderness. There is no rebound.  Musculoskeletal: Normal range of motion. She exhibits no edema.  Lymphadenopathy:    She has no cervical adenopathy.  Neurological: She is oriented to person, place, and time. Coordination normal.  Skin: No rash noted. No erythema.  Psychiatric: She has a normal mood and affect. Her behavior is normal.    ED Course  Procedures (including critical care time)  Labs Reviewed  COMPREHENSIVE METABOLIC PANEL - Abnormal; Notable for the following:    Sodium 130 (*)    Glucose, Bld 110 (*)    Total Bilirubin 0.2 (*)    All other components within normal limits  D-DIMER, QUANTITATIVE - Abnormal; Notable for the following:    D-Dimer, Quant 2.69 (*)    All other components within normal limits  CBC  DIFFERENTIAL  POCT I-STAT TROPONIN I  I-STAT TROPONIN I   Dg Chest 2 View  07/12/2011  *RADIOLOGY REPORT*  Clinical Data: Shortness of breath.  CHEST - 2 VIEW  Comparison: 12/23/2008  Findings: Dual lead pacer is in place.  Heart size and vascularity are normal.  There is slight atelectasis at both lung bases due to a shallow inspiration.  No effusions.  No  acute osseous abnormality.  IMPRESSION: Bibasilar atelectasis.  However, the patient has taken a very shallow inspiration as compared to the prior exam.  Original Report Authenticated By: Gwynn Burly, M.D.   Ct Angio Chest W/cm &/or Wo Cm  07/12/2011  *RADIOLOGY REPORT*  Clinical Data:  Shortness of breath, left chest pain, shortness of breath with exertion, loss of appetite  CT ANGIOGRAPHY CHEST WITH CONTRAST  Technique:  Multidetector CT imaging of the chest was performed using the standard protocol during bolus administration of intravenous contrast.  Multiplanar CT image reconstructions including MIPs were obtained to evaluate the vascular anatomy.  Contrast: OMNIPAQUE IOHEXOL 300 MG/ML IV SOLN  Comparison:  09/22/2005  Findings: Minimal atherosclerotic calcification aorta. Atherosclerotic calcification within coronary arteries. No aortic aneurysm or dissection. Well opacified pulmonary arterial tree. Scattered respiratory motion artifacts however degrade assessment at the inferior lower lobes. Pulmonary arteries are grossly patent without evidence pulmonary embolism as above. Scattered normal-sized mediastinal and hilar lymph nodes. No definite thoracic adenopathy.  Beam hardening artifacts from pacemaker pack left chest wall, with leads identified at right atrium and right ventricle. Large area of low attenuation within posterior lateral aspects of spleen. Finding is suspicious for a splenic infarct, less likely mass; an artifact from the timing of imaging versus contrast is not completely excluded. Remaining visualized portions of upper abdomen normal. Atelectasis bilaterally at the inferior aspects of the lower lobes, greater on left. Remaining lungs clear. Bones appear diffusely demineralized.  Review of the MIP images confirms the above findings.  IMPRESSION: No evidence of pulmonary embolism. Subsegmental atelectasis bilateral lower lobes. Large area of peripheral low attenuation within  spleen worrisome for splenic infarct, mass considered less likely. Consider follow-up abdominal CT with IV and oral contrast further evaluation, following hydration and re-assessment of renal function.  Findings called to Dr. Estell Harpin on 07/12/2011 at 1918 hours.  Original Report Authenticated By: Lollie Marrow, M.D.     No diagnosis found.    MDM  We had the patient's pacemaker interrogated and it was working correctly the patient was in atrial fibrillation  Results for orders placed during the hospital encounter of 07/12/11  CBC      Component Value Range   WBC 10.0  4.0 - 10.5 (K/uL)   RBC 4.50  3.87 - 5.11 (MIL/uL)   Hemoglobin 13.2  12.0 - 15.0 (g/dL)   HCT 16.1  09.6 - 04.5 (%)   MCV 86.4  78.0 - 100.0 (fL)   MCH 29.3  26.0 - 34.0 (pg)   MCHC 33.9  30.0 - 36.0 (g/dL)   RDW 40.9  81.1 - 91.4 (%)   Platelets 326  150 - 400 (K/uL)  DIFFERENTIAL      Component Value Range   Neutrophils Relative 73  43 - 77 (%)   Neutro Abs 7.3  1.7 - 7.7 (K/uL)   Lymphocytes Relative 17  12 - 46 (%)   Lymphs Abs 1.7  0.7 - 4.0 (K/uL)   Monocytes Relative 8  3 - 12 (%)   Monocytes Absolute 0.8  0.1 - 1.0 (K/uL)   Eosinophils Relative 2  0 - 5 (%)   Eosinophils Absolute 0.2  0.0 - 0.7 (K/uL)   Basophils Relative 0  0 - 1 (%)   Basophils Absolute 0.0  0.0 - 0.1 (K/uL)  COMPREHENSIVE METABOLIC PANEL      Component Value Range   Sodium 130 (*) 135 - 145 (mEq/L)   Potassium 4.7  3.5 - 5.1 (mEq/L)   Chloride 97  96 - 112 (mEq/L)   CO2 21  19 - 32 (mEq/L)   Glucose, Bld 110 (*) 70 - 99 (mg/dL)   BUN 18  6 - 23 (mg/dL)   Creatinine, Ser 7.82  0.50 - 1.10 (mg/dL)   Calcium 9.6  8.4 - 95.6 (mg/dL)   Total Protein 7.7  6.0 - 8.3 (g/dL)   Albumin 3.6  3.5 -  5.2 (g/dL)   AST 25  0 - 37 (U/L)   ALT 34  0 - 35 (U/L)   Alkaline Phosphatase 101  39 - 117 (U/L)   Total Bilirubin 0.2 (*) 0.3 - 1.2 (mg/dL)   GFR calc non Af Amer >90  >90 (mL/min)   GFR calc Af Amer >90  >90 (mL/min)  D-DIMER,  QUANTITATIVE      Component Value Range   D-Dimer, Quant 2.69 (*) 0.00 - 0.48 (ug/mL-FEU)  POCT I-STAT TROPONIN I      Component Value Range   Troponin i, poc 0.02  0.00 - 0.08 (ng/mL)   Comment 3            Dg Chest 2 View  07/12/2011  *RADIOLOGY REPORT*  Clinical Data: Shortness of breath.  CHEST - 2 VIEW  Comparison: 12/23/2008  Findings: Dual lead pacer is in place.  Heart size and vascularity are normal.  There is slight atelectasis at both lung bases due to a shallow inspiration.  No effusions.  No acute osseous abnormality.  IMPRESSION: Bibasilar atelectasis.  However, the patient has taken a very shallow inspiration as compared to the prior exam.  Original Report Authenticated By: Gwynn Burly, M.D.   Ct Angio Chest W/cm &/or Wo Cm  07/12/2011  *RADIOLOGY REPORT*  Clinical Data:  Shortness of breath, left chest pain, shortness of breath with exertion, loss of appetite  CT ANGIOGRAPHY CHEST WITH CONTRAST  Technique:  Multidetector CT imaging of the chest was performed using the standard protocol during bolus administration of intravenous contrast.  Multiplanar CT image reconstructions including MIPs were obtained to evaluate the vascular anatomy.  Contrast: OMNIPAQUE IOHEXOL 300 MG/ML IV SOLN  Comparison:  09/22/2005  Findings: Minimal atherosclerotic calcification aorta. Atherosclerotic calcification within coronary arteries. No aortic aneurysm or dissection. Well opacified pulmonary arterial tree. Scattered respiratory motion artifacts however degrade assessment at the inferior lower lobes. Pulmonary arteries are grossly patent without evidence pulmonary embolism as above. Scattered normal-sized mediastinal and hilar lymph nodes. No definite thoracic adenopathy.  Beam hardening artifacts from pacemaker pack left chest wall, with leads identified at right atrium and right ventricle. Large area of low attenuation within posterior lateral aspects of spleen. Finding is suspicious for a  splenic infarct, less likely mass; an artifact from the timing of imaging versus contrast is not completely excluded. Remaining visualized portions of upper abdomen normal. Atelectasis bilaterally at the inferior aspects of the lower lobes, greater on left. Remaining lungs clear. Bones appear diffusely demineralized.  Review of the MIP images confirms the above findings.  IMPRESSION: No evidence of pulmonary embolism. Subsegmental atelectasis bilateral lower lobes. Large area of peripheral low attenuation within spleen worrisome for splenic infarct, mass considered less likely. Consider follow-up abdominal CT with IV and oral contrast further evaluation, following hydration and re-assessment of renal function.  Findings called to Dr. Estell Harpin on 07/12/2011 at 1918 hours.  Original Report Authenticated By: Lollie Marrow, M.D.      Date: 07/12/2011  Rate:60  Rhythm: atrial fibrillation  QRS Axis: normal  Intervals: normal  ST/T Wave abnormalities: nonspecific ST changes  Conduction Disutrbances:left bundle branch block  Narrative Interpretation:   Old EKG Reviewed: none available  Internal medicine to admit the patient but they wanted cardiology called to see if they felt like the patient should be put on a blood thinner      Benny Lennert, MD 07/12/11 2014

## 2011-07-12 NOTE — ED Notes (Signed)
Pt states that she has had stomach pain x 1 week and then she started to have left sided chest pain where her pacer is. Pt states that she was so sob w/any exertion she also states that she had loss of appetite. Pt  Is concerned that her pacer is causing this

## 2011-07-12 NOTE — ED Notes (Signed)
Called the floor to give report and spoke with Ty, Charity fundraiser.  Pt VSSS, NAD, resp equal/nonlabored.

## 2011-07-12 NOTE — ED Notes (Signed)
Awaiting evaluation from internal medicine for admission.

## 2011-07-12 NOTE — ED Notes (Signed)
Per ems: Pt c/o SOB for past 2 weeks. Over past week feels pain when lying flat and when taking deep breaths, pain radiates into chest. Pain hurts at site of pacemaker. Pt was seen at [facility] in [place] today and sent for cardiac eval. Pt denies cough, fevers. Reports sob increases on exertion. En route, A&Ox4, resp e/u. PCP gave pt 324mg  ASA in office today

## 2011-07-12 NOTE — H&P (Signed)
[name] is an 76 y.o. female.   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 The chest pain was more on the left side and pleuritic in nature. Has been experiencing chest pain for last two weeks. In addition has had exertional shortness of breath. Patient states in the initial week patient also had upper abdominal pain which is resolved now. Denies any nausea or vomiting or diarrhea. In the ER patient had EKG showing afib, cardiac enzymes negative but as d dimer was high patient under went CT angio chest  Which is negative for pulmonary embolism but is showing features of splenic infarct. I did discuss with radiologist Dr Tyron Russell myself personally. ER physician Dr Estell Harpin discussed with oncall cardiologist for [facility] who advised anticoagulation with lovenox and coumadin. Patient thas been admitted under hospitalist service.   Past Medical History  Diagnosis Date  . Asthma   . Rhinitis   . Osteoarthritis   . Osteoporosis   . Peptic ulcer disease   . Depression   . Atrial fibrillation     s/p ablation   . Takotsubo cardiomyopathy     resolved  . Shingles   . Eosinophilia   . Cardiomyopathy   . Sinoatrial node dysfunction   . Atrial arrhythmia   . Sinusitis   . Rhinitis   . GERD (gastroesophageal reflux disease)   . Bronchiectasis   . Peptic ulcer disease   . Osteoarthritis   . Herpes zoster   . Hyperlipemia     Past Surgical History  Procedure Date  . Cholecystectomy   . Tonsillectomy   . Cataract extraction   . Pacemaker insertion 09/29/06    Family History  Problem Relation Age of Onset  . Coronary artery disease      femal 1st degree relative  . Liver cancer Son   . Emphysema Sister   . Asthma Mother   . Depression Father     nervous  breakdown   Social History:  reports that she has never smoked. She does not have any smokeless tobacco history on file. She reports that she does not drink alcohol or use illicit drugs.  Allergies:  Allergies  Allergen Reactions  . Benzoyl Peroxide     REACTION: unspecified  . Triple Antibiotic     REACTION: unspecified    Medications Prior to Admission  Medication Dose Route Frequency Provider Last Rate Last Dose  . enoxaparin (LOVENOX) injection 60 mg  60 mg Subcutaneous Once Benny Lennert, MD      . iohexol (OMNIPAQUE) 300 MG/ML injection 100 mL  100 mL Intravenous Once PRN Medication Radiologist   100 mL at 07/12/11 1901   Medications Prior to Admission  Medication Sig Dispense Refill  . Ascorbic Acid (VITAMIN C) 1000 MG tablet Take 1,000 mg by mouth daily.        [name] aspirin 81 MG EC tablet Take 81 mg by mouth daily.        [name] atorvastatin (LIPITOR) 10 MG tablet TAKE 1 TABLET BY MOUTH AT BEDTIME  30 tablet  5  . beclomethasone (QVAR) 80 MCG/ACT inhaler Inhale 2 puffs into the lungs 2 (two) times daily.  1 Inhaler  3  . budesonide (RHINOCORT AQUA) 32 MCG/ACT nasal spray 1 spray each nostril  qd  1 Bottle  5  . Calcium Carbonate (CALCIUM 600) 1500 MG TABS Take 1 tablet by mouth daily.        . Cholecalciferol (VITAMIN D3) 1000 UNITS CAPS Take 1 capsule by mouth daily.        [name] esomeprazole (NEXIUM) 40 MG capsule Take 1 capsule (40 mg total) by mouth daily before breakfast.  30 capsule  11  . fexofenadine (ALLEGRA) 180 MG tablet Take 180 mg by mouth daily.       [name] LEXAPRO 10 MG tablet TAKE 1 TABLET EVERY DAY  30 tablet  2  . Multiple Vitamin (MULTIVITAMIN) tablet Take 1 tablet by mouth daily.        [name] SPIRIVA HANDIHALER 18 MCG inhalation capsule PLACE 1 CAPSULE (18 MCG TOTAL) INTO INHALER AND INHALE DAILY.  30 each  3  . traZODone (DESYREL) 100 MG tablet 2 po qhs  60 tablet  2    Results for orders placed during the hospital encounter of 07/12/11 (from the past 48 hour(s))  CBC      Status: Normal   Collection Time   07/12/11  3:22 PM      Component Value Range Comment   WBC 10.0  4.0 - 10.5 (K/uL)    RBC 4.50  3.87 - 5.11 (MIL/uL)    Hemoglobin 13.2  12.0 - 15.0 (g/dL)    HCT 16.1  09.6 - 04.5 (%)    MCV 86.4  78.0 - 100.0 (fL)    MCH 29.3  26.0 - 34.0 (pg)    MCHC 33.9  30.0 - 36.0 (g/dL)    RDW 40.9  81.1 - 91.4 (%)    Platelets 326  150 - 400 (K/uL)   DIFFERENTIAL     Status: Normal   Collection Time   07/12/11  3:22 PM      Component Value Range Comment   Neutrophils Relative 73  43 - 77 (%)    Neutro Abs 7.3  1.7 - 7.7 (K/uL)    Lymphocytes Relative 17  12 - 46 (%)    Lymphs Abs 1.7  0.7 - 4.0 (K/uL)    Monocytes Relative 8  3 - 12 (%)    Monocytes Absolute 0.8  0.1 - 1.0 (K/uL)    Eosinophils Relative 2  0 - 5 (%)    Eosinophils Absolute 0.2  0.0 - 0.7 (K/uL)    Basophils Relative 0  0 - 1 (%)    Basophils Absolute 0.0  0.0 - 0.1 (K/uL)   COMPREHENSIVE METABOLIC PANEL     Status: Abnormal   Collection Time   07/12/11  3:22 PM      Component Value Range Comment   Sodium 130 (*) 135 - 145 (mEq/L)    Potassium 4.7  3.5 - 5.1 (mEq/L)    Chloride 97  96 - 112 (mEq/L)    CO2 21  19 - 32 (mEq/L)    Glucose, Bld 110 (*) 70 - 99 (mg/dL)    BUN 18  6 - 23 (mg/dL)    Creatinine, Ser 7.82  0.50 - 1.10 (mg/dL)    Calcium 9.6  8.4 - 10.5 (mg/dL)    Total Protein 7.7  6.0 - 8.3 (g/dL)    Albumin 3.6  3.5 - 5.2 (g/dL)    AST 25  0 - 37 (U/L)    ALT 34  0 - 35 (U/L)    Alkaline Phosphatase 101  39 - 117 (U/L)    Total  Bilirubin 0.2 (*) 0.3 - 1.2 (mg/dL)    GFR calc non Af Amer >90  >90 (mL/min)    GFR calc Af Amer >90  >90 (mL/min)   D-DIMER, QUANTITATIVE     Status: Abnormal   Collection Time   07/12/11  3:22 PM      Component Value Range Comment   D-Dimer, Quant 2.69 (*) 0.00 - 0.48 (ug/mL-FEU)   POCT I-STAT TROPONIN I     Status: Normal   Collection Time   07/12/11  3:44 PM      Component Value Range Comment   Troponin i, poc 0.02  0.00 - 0.08  (ng/mL)    Comment 3             Dg Chest 2 View  07/12/2011  *RADIOLOGY REPORT*  Clinical Data: Shortness of breath.  CHEST - 2 VIEW  Comparison: 12/23/2008  Findings: Dual lead pacer is in place.  Heart size and vascularity are normal.  There is slight atelectasis at both lung bases due to a shallow inspiration.  No effusions.  No acute osseous abnormality.  IMPRESSION: Bibasilar atelectasis.  However, the patient has taken a very shallow inspiration as compared to the prior exam.  Original Report Authenticated By: Gwynn Burly, M.D.   Ct Angio Chest W/cm &/or Wo Cm  07/12/2011  *RADIOLOGY REPORT*  Clinical Data:  Shortness of breath, left chest pain, shortness of breath with exertion, loss of appetite  CT ANGIOGRAPHY CHEST WITH CONTRAST  Technique:  Multidetector CT imaging of the chest was performed using the standard protocol during bolus administration of intravenous contrast.  Multiplanar CT image reconstructions including MIPs were obtained to evaluate the vascular anatomy.  Contrast: OMNIPAQUE IOHEXOL 300 MG/ML IV SOLN  Comparison:  09/22/2005  Findings: Minimal atherosclerotic calcification aorta. Atherosclerotic calcification within coronary arteries. No aortic aneurysm or dissection. Well opacified pulmonary arterial tree. Scattered respiratory motion artifacts however degrade assessment at the inferior lower lobes. Pulmonary arteries are grossly patent without evidence pulmonary embolism as above. Scattered normal-sized mediastinal and hilar lymph nodes. No definite thoracic adenopathy.  Beam hardening artifacts from pacemaker pack left chest wall, with leads identified at right atrium and right ventricle. Large area of low attenuation within posterior lateral aspects of spleen. Finding is suspicious for a splenic infarct, less likely mass; an artifact from the timing of imaging versus contrast is not completely excluded. Remaining visualized portions of upper abdomen normal.  Atelectasis bilaterally at the inferior aspects of the lower lobes, greater on left. Remaining lungs clear. Bones appear diffusely demineralized.  Review of the MIP images confirms the above findings.  IMPRESSION: No evidence of pulmonary embolism. Subsegmental atelectasis bilateral lower lobes. Large area of peripheral low attenuation within spleen worrisome for splenic infarct, mass considered less likely. Consider follow-up abdominal CT with IV and oral contrast further evaluation, following hydration and re-assessment of renal function.  Findings called to Dr. Estell Harpin on 07/12/2011 at 1918 hours.  Original Report Authenticated By: Lollie Marrow, M.D.    Review of Systems  Constitutional: Negative.   HENT: Negative.   Eyes: Negative.   Respiratory: Positive for shortness of breath.   Cardiovascular: Positive for chest pain.  Gastrointestinal: Positive for abdominal pain.  Genitourinary: Negative.   Musculoskeletal: Negative.   Skin: Negative.   Neurological: Negative.   Endo/Heme/Allergies: Negative.   Psychiatric/Behavioral: Negative.     Blood pressure 169/83, pulse 64, temperature 97.8 F (36.6 C), temperature source Oral, resp. rate 30, height 4\' 11"  (  1.499 m), weight 62.143 kg (137 lb), SpO2 95.00%. Physical Exam  Constitutional: She is oriented to person, place, and time. She appears well-developed and well-nourished.  HENT:  Head: Normocephalic and atraumatic.  Right Ear: External ear normal.  Left Ear: External ear normal.  Nose: Nose normal.  Mouth/Throat: Oropharynx is clear and moist.  Eyes: Conjunctivae and EOM are normal. Pupils are equal, round, and reactive to light.  Neck: Normal range of motion. Neck supple.  Cardiovascular: Normal rate, regular rhythm, normal heart sounds and intact distal pulses.   Respiratory: Effort normal and breath sounds normal.  GI: Soft. Bowel sounds are normal.  Neurological: She is alert and oriented to person, place, and time.  Skin:  Skin is warm and dry.  Psychiatric: Her behavior is normal.     Assessment/Plan 1)Chest pain pleuritic probably from splenic infract 2)Afib rate controlled with H/O aflutter S/P ablation 3)H/O sinus node dysfunction S/P pacemaker placement 4)H/O gi bleed secondary to peptic ulcer disease. 5)H/O Bronchiectasis 6)H/O Tako- Tsubo cardiomyopathy  Plan:  Admit to telemetry At this time patient has already been started on lovenox will add coumadin which will be dosed per pharmacy Radiologist has recommended CT abdomen and pelvis to further study her abdomen particularly her spleen after adequate hydration. The CT abdomen pelvis will ordered for tomorrow after checking her creatinine. Patient did have history of GI bleed. Will closely monitor her CBC continue protonix. Will also check blood cultures,  2D echo and cardiac enzymes.  Eduard Clos 07/12/2011, 9:43 PM

## 2011-07-12 NOTE — Progress Notes (Signed)
  Subjective:    Patient ID: [name], female    DOB: 07-31-1935, 76 y.o.   MRN: [id number]  HPI Pt is here with her husband c/o 2 weeks hx SOB and chest pain.  She thought she had a stomach virus in the beginning but then she started having chest pains that went to her pacemaker per patient.  No NVD,  No palpitations.   Pt is sob in exam room.    Review of Systems As above    Objective:   Physical Exam  Constitutional: She is oriented to person, place, and time. She appears well-developed and well-nourished. She appears distressed.  Cardiovascular: Normal rate and regular rhythm.   Pulmonary/Chest: Breath sounds normal. She is in respiratory distress. She has no wheezes. She has no rales. She exhibits no tenderness.  Neurological: She is alert and oriented to person, place, and time.  Psychiatric: She has a normal mood and affect. Her behavior is normal.          Assessment & Plan:  SOB with chest pain----  911 called to transfer pt to ER                                           325 mg asa given to patient

## 2011-07-13 ENCOUNTER — Inpatient Hospital Stay (HOSPITAL_COMMUNITY): Payer: Medicare PPO

## 2011-07-13 DIAGNOSIS — I059 Rheumatic mitral valve disease, unspecified: Secondary | ICD-10-CM

## 2011-07-13 LAB — CARDIAC PANEL(CRET KIN+CKTOT+MB+TROPI)
CK, MB: 2.2 ng/mL (ref 0.3–4.0)
CK, MB: 2.3 ng/mL (ref 0.3–4.0)
Relative Index: INVALID (ref 0.0–2.5)
Total CK: 41 U/L (ref 7–177)
Total CK: 47 U/L (ref 7–177)
Troponin I: <0.3 ng/mL (ref <0.30)
Troponin I: <0.3 ng/mL (ref <0.30)

## 2011-07-13 LAB — PROTIME-INR
INR: 1.09 (ref 0.00–1.49)
Prothrombin Time: 13.9 seconds (ref 11.6–15.2)
Prothrombin Time: 14.3 seconds (ref 11.6–15.2)

## 2011-07-13 LAB — CBC
HCT: 40.2 % (ref 36.0–46.0)
Hemoglobin: 13.6 g/dL (ref 12.0–15.0)
RBC: 4.67 MIL/uL (ref 3.87–5.11)
WBC: 8.4 10*3/uL (ref 4.0–10.5)

## 2011-07-13 LAB — TSH: TSH: 3.115 u[IU]/mL (ref 0.350–4.500)

## 2011-07-13 LAB — COMPREHENSIVE METABOLIC PANEL
ALT: 37 U/L — ABNORMAL HIGH (ref 0–35)
BUN: 14 mg/dL (ref 6–23)
Calcium: 9.9 mg/dL (ref 8.4–10.5)
Creatinine, Ser: 0.62 mg/dL (ref 0.50–1.10)
GFR calc Af Amer: >90 mL/min (ref >90)
GFR calc non Af Amer: 87 mL/min — ABNORMAL LOW (ref >90)
Glucose, Bld: 89 mg/dL (ref 70–99)
Sodium: 129 mEq/L — ABNORMAL LOW (ref 135–145)
Total Protein: 8 g/dL (ref 6.0–8.3)

## 2011-07-13 LAB — OSMOLALITY, URINE: Osmolality, Ur: 573 mOsm/kg (ref 390–1090)

## 2011-07-13 LAB — MAGNESIUM: Magnesium: 1.9 mg/dL (ref 1.5–2.5)

## 2011-07-13 MED ORDER — ENOXAPARIN SODIUM 60 MG/0.6ML ~~LOC~~ SOLN
60.0000 mg | Freq: Two times a day (BID) | SUBCUTANEOUS | Status: DC
  Administered 2011-07-13 – 2011-07-14 (×3): 60 mg via SUBCUTANEOUS
  Filled 2011-07-13 (×5): qty 0.6

## 2011-07-13 MED ORDER — WARFARIN SODIUM 5 MG PO TABS
5.0000 mg | ORAL_TABLET | ORAL | Status: AC
  Administered 2011-07-13: 5 mg via ORAL
  Filled 2011-07-13: qty 1

## 2011-07-13 MED ORDER — ACETAMINOPHEN 325 MG PO TABS: 650.0000 mg | ORAL_TABLET | ORAL | Status: DC | PRN

## 2011-07-13 MED ORDER — WARFARIN VIDEO
Freq: Once | Status: DC
  Filled 2011-07-13: qty 1

## 2011-07-13 MED ORDER — IOHEXOL 350 MG/ML SOLN
100.0000 mL | Freq: Once | INTRAVENOUS | Status: AC | PRN
  Administered 2011-07-13: 100 mL via INTRAVENOUS

## 2011-07-13 MED ORDER — PATIENT'S GUIDE TO USING COUMADIN BOOK
Freq: Once | Status: AC
  Administered 2011-07-13: 08:00:00
  Filled 2011-07-13: qty 1

## 2011-07-13 MED ORDER — ESCITALOPRAM OXALATE 10 MG PO TABS
10.0000 mg | ORAL_TABLET | Freq: Every day | ORAL | Status: DC
  Administered 2011-07-13: 10 mg via ORAL
  Filled 2011-07-13 (×2): qty 1

## 2011-07-13 MED ORDER — WARFARIN SODIUM 5 MG PO TABS
5.0000 mg | ORAL_TABLET | Freq: Once | ORAL | Status: AC
  Administered 2011-07-13: 5 mg via ORAL
  Filled 2011-07-13: qty 1

## 2011-07-13 NOTE — Progress Notes (Signed)
ANTICOAGULATION CONSULT NOTE - Initial Consult  Pharmacy Consult for Coumadin; also Lovenox per MD Indication: atrial fibrillation  Allergies  Allergen Reactions  . Benzoyl Peroxide     REACTION: unspecified  . Triple Antibiotic     REACTION: unspecified    Patient Measurements: Height: 4\' 11"  (149.9 cm) Weight: 133 lb 6.1 oz (60.5 kg) IBW/kg (Calculated) : 43.2    Vital Signs: Temp: 97.6 F (36.4 C) (11/19 2307) Temp src: Oral (11/19 2307) BP: 145/82 mmHg (11/19 2307) Pulse Rate: 62  (11/19 2307)  Labs:  Basename 07/13/11 0003 07/12/11 1522  HGB -- 13.2  HCT -- 38.9  PLT -- 326  APTT -- --  LABPROT 13.9 --  INR 1.05 --  HEPARINUNFRC -- --  CREATININE -- 0.54  CKTOTAL -- --  CKMB -- --  TROPONINI -- --   Estimated Creatinine Clearance: 48.8 ml/min (by C-G formula based on Cr of 0.54).  Medical History: Past Medical History  Diagnosis Date  . Asthma   . Rhinitis   . Osteoarthritis   . Osteoporosis   . Peptic ulcer disease   . Depression   . Atrial fibrillation     s/p ablation   . Takotsubo cardiomyopathy     resolved  . Shingles   . Eosinophilia   . Cardiomyopathy   . Sinoatrial node dysfunction   . Atrial arrhythmia   . Sinusitis   . Rhinitis   . GERD (gastroesophageal reflux disease)   . Bronchiectasis   . Peptic ulcer disease   . Osteoarthritis   . Herpes zoster   . Hyperlipemia     Medications:  Prescriptions prior to admission  Medication Sig Dispense Refill  . Ascorbic Acid (VITAMIN C) 1000 MG tablet Take 1,000 mg by mouth daily.        [name] aspirin 81 MG EC tablet Take 81 mg by mouth daily.        [name] atorvastatin (LIPITOR) 10 MG tablet TAKE 1 TABLET BY MOUTH AT BEDTIME  30 tablet  5  . beclomethasone (QVAR) 80 MCG/ACT inhaler Inhale 2 puffs into the lungs 2 (two) times daily.  1 Inhaler  3  . budesonide (RHINOCORT AQUA) 32 MCG/ACT nasal spray 1 spray each nostril qd  1 Bottle  5  . Calcium Carbonate (CALCIUM 600) 1500 MG TABS Take 1  tablet by mouth daily.        . Cholecalciferol (VITAMIN D3) 1000 UNITS CAPS Take 1 capsule by mouth daily.        [name] esomeprazole (NEXIUM) 40 MG capsule Take 1 capsule (40 mg total) by mouth daily before breakfast.  30 capsule  11  . fexofenadine (ALLEGRA) 180 MG tablet Take 180 mg by mouth daily.       [name] LEXAPRO 10 MG tablet TAKE 1 TABLET EVERY DAY  30 tablet  2  . Multiple Vitamin (MULTIVITAMIN) tablet Take 1 tablet by mouth daily.        [name] SPIRIVA HANDIHALER 18 MCG inhalation capsule PLACE 1 CAPSULE (18 MCG TOTAL) INTO INHALER AND INHALE DAILY.  30 each  3  . traZODone (DESYREL) 100 MG tablet 2 po qhs  60 tablet  2    Assessment: 76 y.o. Female presents with chest pain. Afib on EKG. MD started lovenox and now wants to start coumadin.   Goal of Therapy:  INR 2-3   Plan:  1. Coumadin 5mg  po now 2. Daily PT/INR 3. Educational booklet and video  Lavonia Dana 07/13/2011,1:24 AM

## 2011-07-13 NOTE — Progress Notes (Signed)
Patient seen, examined and d/w my P.A.  I have reviewed her note and agree.  CT of the abd/pelvis confirms splenic infarct, yet all vessels look to be patent.  Awaiting echo results.  Echo findings will determine long term anticoagulation plans.

## 2011-07-13 NOTE — Progress Notes (Signed)
Utilization review complete 

## 2011-07-13 NOTE — Progress Notes (Addendum)
ANTICOAGULATION CONSULT NOTE - Follow Up Consult  Pharmacy Consult for Lovenox (per MD) and Coumadin Indication: atrial fibrillation  Allergies  Allergen Reactions  . Benzoyl Peroxide     REACTION: unspecified  . Triple Antibiotic     REACTION: unspecified    Patient Measurements: Height: 4\' 11"  (149.9 cm) Weight: 133 lb 6.1 oz (60.5 kg) IBW/kg (Calculated) : 43.2  Adjusted Body Weight:   Vital Signs: Temp: 97.8 F (36.6 C) (11/20 0503) Temp src: Oral (11/20 0503) BP: 138/90 mmHg (11/20 0503) Pulse Rate: 61  (11/20 0503)  Labs:  Basename 07/13/11 0743 07/13/11 0003 07/13/11 0002 07/12/11 1522  HGB 13.6 -- -- 13.2  HCT 40.2 -- -- 38.9  PLT 345 -- -- 326  APTT -- -- -- --  LABPROT 14.3 13.9 -- --  INR 1.09 1.05 -- --  HEPARINUNFRC -- -- -- --  CREATININE 0.62 -- -- 0.54  CKTOTAL 40 -- 41 --  CKMB 2.4 -- 2.2 --  TROPONINI <0.30 -- <0.30 --   Estimated Creatinine Clearance: 48.8 ml/min (by C-G formula based on Cr of 0.62).   Medications:  Lovenox 60mg  SQ q12h  Assessment: 76yof on Lovenox bridging to Coumadin for Afib. INR (1.09) is subtherapeutic as expected after Coumadin 5mg  x1.  - H/H and Plts stable - No bleeding reported - Wt 60.5kg - CrCl 50 ml/min  Goal of Therapy:  INR 2-3 Anti-Xa level: 0.6-1.2   Plan:  1. Coumadin 5mg  po x 1 2. Continue Lovenox 60mg  (1mg /kg) SQ q12h 3. Follow-up AM INR and renal function  Cleon Dew [phone] 07/13/2011,11:54 AM

## 2011-07-13 NOTE — Progress Notes (Signed)
  Echocardiogram 2D Echocardiogram has been performed.  [name] 07/13/2011, 4:15 PM

## 2011-07-13 NOTE — Progress Notes (Signed)
Subjective: Now with slight headache and left upper quadrant abdominal pain.  Still awaiting morning lab results.  Patient on Oxygen (Nasal Cannula) not on Oxygen at home.   Husband at bedside.  Objective:  Vital signs in last 24 hours:  Filed Vitals:   07/12/11 2157 07/12/11 2307 07/13/11 0503 07/13/11 0754  BP: 166/79 145/82 138/90   Pulse: 60 62 61   Temp: 97.5 F (36.4 C) 97.6 F (36.4 C) 97.8 F (36.6 C)   TempSrc: Oral Oral Oral   Resp: 21 18 20    Height:  4\' 11"  (1.499 m)    Weight:  60.5 kg (133 lb 6.1 oz)    SpO2: 97% 90% 93% 92%   Telemetry is V. paced at 74 beats per minute  Intake/Output from previous day:  No intake or output data in the 24 hours ending 07/13/11 0836  Physical Exam: Ms. Latka is alert oriented pleasant to speak with in no apparent distress. She just finished bathing herself. Heart has a regular rate and rhythm without obvious murmurs rubs or gallops Lungs are clear to auscultation bilaterally with no wheezes or crackles Abdomen distended slightly firm, normal bowel sounds, tender to palpation in the left upper quadrant, no organomegaly noted Extremities show no clubbing cyanosis or edema Psychiatric patient is alert oriented, demeanor cooperative, grooming excellent   Lab Results: From Last night Basic Metabolic Panel:    Component Value Date/Time   NA 130* 07/12/2011 1522   K 4.7 07/12/2011 1522   CL 97 07/12/2011 1522   CO2 21 07/12/2011 1522   BUN 18 07/12/2011 1522   CREATININE 0.54 07/12/2011 1522   GLUCOSE 110* 07/12/2011 1522   CALCIUM 9.6 07/12/2011 1522   CBC:    Component Value Date/Time   WBC 10.0 07/12/2011 1522   WBC 7.9 04/05/2007 1535   HGB 13.2 07/12/2011 1522   HGB 13.2 04/05/2007 1535   HCT 38.9 07/12/2011 1522   HCT 37.2 04/05/2007 1535   PLT 326 07/12/2011 1522   PLT 292 04/05/2007 1535   MCV 86.4 07/12/2011 1522   MCV 89.6 04/05/2007 1535   NEUTROABS 7.3 07/12/2011 1522   NEUTROABS 5.6 04/05/2007 1535   LYMPHSABS 1.7 07/12/2011 1522   LYMPHSABS 1.3 04/05/2007 1535   MONOABS 0.8 07/12/2011 1522   MONOABS 0.6 04/05/2007 1535   EOSABS 0.2 07/12/2011 1522   EOSABS 0.4 04/05/2007 1535   BASOSABS 0.0 07/12/2011 1522   BASOSABS 0.0 04/05/2007 1535    No results found for this or any previous visit (from the past 240 hour(s)).  Studies/Results: Dg Chest 2 View  07/12/2011  *RADIOLOGY REPORT*  Clinical Data: Shortness of breath.  CHEST - 2 VIEW  Comparison: 12/23/2008  IMPRESSION: Bibasilar atelectasis.  However, the patient has taken a very shallow inspiration as compared to the prior exam.   Ct Angio Chest W/cm &/or Wo Cm  07/12/2011  *RADIOLOGY REPORT*  Clinical Data:  Shortness of breath, left chest pain, shortness of breath with exertion, loss of appetite  CT ANGIOGRAPHY CHEST WITH CONTRAST  IMPRESSION: No evidence of pulmonary embolism. Subsegmental atelectasis bilateral lower lobes. Large area of peripheral low attenuation within spleen worrisome for splenic infarct, mass considered less likely. Consider follow-up abdominal CT with IV and oral contrast further evaluation, following hydration and re-assessment of renal function.  Medications: Scheduled Meds:   . aspirin  81 mg Oral Daily  . calcium carbonate  1 tablet Oral Daily  . enoxaparin  60 mg Subcutaneous Once  . enoxaparin (  LOVENOX) injection  1 mg/kg Subcutaneous Q12H  . escitalopram  10 mg Oral Daily  . fluticasone  2 spray Each Nare Daily  . fluticasone  2 puff Inhalation BID  . loratadine  10 mg Oral Daily  . multivitamins ther. w/minerals  1 tablet Oral Daily  . pantoprazole  40 mg Oral Daily  . patient's guide to using coumadin book   Does not apply Once  . simvastatin  20 mg Oral Daily  . tiotropium  18 mcg Inhalation Daily  . traZODone  200 mg Oral QHS  . vitamin C  1,000 mg Oral Daily  . warfarin  5 mg Oral STAT  . warfarin   Does not apply Once  . DISCONTD: multivitamin  1 tablet Oral Daily   Continuous  Infusions:   . sodium chloride 75 mL/hr at 07/12/11 2330   PRN Meds:.acetaminophen, acetaminophen, HYDROmorphone, iohexol, ondansetron (ZOFRAN) IV, ondansetron  Assessment/Plan:  Principal Problem:  *Chest pain Active Problems:  CARDIOMYOPATHY  Sinoatrial node dysfunction  BRONCHIECTASIS  PACEMAKER, PERMANENT  Splenic infarct  A-fib  1.  Patient's pain is no longer in her chest. It is primarily located in her left upper abdomen. It is felt to be due to a splenic infarct. She needs an evaluation for mesenteric ischemia and a cardiac workup for possible etiology. The patient is going to 2-D echo now. When her lab results from this morning return if her BUN and creatinine are appropriate, radiology has recommended CT angiogram of her abdomen pelvis with IV contrast and venous phase.  Anticoagulation with lovenox & coumadin has been started.    2.  Mild hypoxia - on Oxygen [place]  3.  Hyponatremia - check urine sodium and osmolality.  4.  Cardiomyopathy with Afib - permanent pacemaker. - appears stable  5.  History of GI bleed secondary to ulcer.    LOS: 1 day   [name],[name] 07/13/2011, 8:36 AM

## 2011-07-14 LAB — CBC
HCT: 37.5 % (ref 36.0–46.0)
MCH: 29 pg (ref 26.0–34.0)
MCHC: 33.3 g/dL (ref 30.0–36.0)
RDW: 15 % (ref 11.5–15.5)

## 2011-07-14 LAB — BASIC METABOLIC PANEL
BUN: 14 mg/dL (ref 6–23)
Calcium: 9.2 mg/dL (ref 8.4–10.5)
Creatinine, Ser: 0.68 mg/dL (ref 0.50–1.10)
GFR calc Af Amer: >90 mL/min (ref >90)
GFR calc non Af Amer: 84 mL/min — ABNORMAL LOW (ref >90)
Potassium: 4.9 mEq/L (ref 3.5–5.1)

## 2011-07-14 LAB — PROTIME-INR: INR: 2.01 — ABNORMAL HIGH (ref 0.00–1.49)

## 2011-07-14 MED ORDER — WARFARIN VIDEO
Freq: Once | Status: AC
  Administered 2011-07-14: 16:00:00
  Filled 2011-07-14: qty 1

## 2011-07-14 MED ORDER — WARFARIN SODIUM 2.5 MG PO TABS: 2.5000 mg | ORAL_TABLET | Freq: Every day | ORAL | Status: DC

## 2011-07-14 MED ORDER — WARFARIN SODIUM 1 MG PO TABS
1.0000 mg | ORAL_TABLET | Freq: Once | ORAL | Status: AC
  Administered 2011-07-14: 1 mg via ORAL
  Filled 2011-07-14: qty 1

## 2011-07-14 NOTE — Progress Notes (Signed)
Patient discharge teaching given, including activity, diet, follow-up appoints, and medications. Patient verbalized understanding of all discharge instructions. IV access was d/c'd. Vitals are stable. Skin is intact. Pt to be escorted out by NT, to be driven home by husband.  

## 2011-07-14 NOTE — Discharge Instructions (Signed)
Atrial Fibrillation Your caregiver has diagnosed you with atrial fibrillation (AFib). The heart normally beats very regularly; AFib is a type of irregular heartbeat. The heart rate may be faster or slower than normal. This can prevent your heart from pumping as well as it should. AFib can be constant (chronic) or intermittent (paroxysmal). CAUSES  Atrial fibrillation may be caused by:  Heart disease, including heart attack, coronary artery disease, heart failure, diseases of the heart valves, and others.   Blood clot in the lungs (pulmonary embolism).   Pneumonia or other infections.   Chronic lung disease.   Thyroid disease.   Toxins. These include alcohol, some medications (such as decongestant medications or diet pills), and caffeine.  In some people, no cause for AFib can be found. This is referred to as Lone Atrial Fibrillation. SYMPTOMS   Palpitations or a fluttering in your chest.   A vague sense of chest discomfort.   Shortness of breath.   Sudden onset of lightheadedness or weakness.  Sometimes, the first sign of AFib can be a complication of the condition. This could be a stroke or heart failure. DIAGNOSIS  Your description of your condition may make your caregiver suspicious of atrial fibrillation. Your caregiver will examine your pulse to determine if fibrillation is present. An EKG (electrocardiogram) will confirm the diagnosis. Further testing may help determine what caused you to have atrial fibrillation. This may include chest x-ray, echocardiogram, blood tests, or CT scans. PREVENTION  If you have previously had atrial fibrillation, your caregiver may advise you to avoid substances known to cause the condition (such as stimulant medications, and possibly caffeine or alcohol). You may be advised to use medications to prevent recurrence. Proper treatment of any underlying condition is important to help prevent recurrence. PROGNOSIS  Atrial fibrillation does tend to  become a chronic condition over time. It can cause significant complications (see below). Atrial fibrillation is not usually immediately life-threatening, but it can shorten your life expectancy. This seems to be worse in women. If you have lone atrial fibrillation and are under 38 years old, the risk of complications is very low, and life expectancy is not shortened. RISKS AND COMPLICATIONS  Complications of atrial fibrillation can include stroke, chest pain, and heart failure. Your caregiver will recommend treatments for the atrial fibrillation, as well as for any underlying conditions, to help minimize risk of complications. TREATMENT  Treatment for AFib is divided into several categories:  Treatment of any underlying condition.   Converting you out of AFib into a regular (sinus) rhythm.   Controlling rapid heart rate.   Prevention of blood clots and stroke.  Medications and procedures are available to convert your atrial fibrillation to sinus rhythm. However, recent studies have shown that this may not offer you any advantage, and cardiac experts are continuing research and debate on this topic. More important is controlling your rapid heartbeat. The rapid heartbeat causes more symptoms, and places strain on your heart. Your caregiver will advise you on the use of medications that can control your heart rate. Atrial fibrillation is a strong stroke risk. You can lessen this risk by taking blood thinning medications such as Coumadin (warfarin), or sometimes aspirin. These medications need close monitoring by your caregiver. Over-medication can cause bleeding. Too little medication may not protect against stroke. HOME CARE INSTRUCTIONS   If your caregiver prescribed medicine to make your heartbeat more normally, take as directed.   If blood thinners were prescribed by your caregiver, take EXACTLY as directed.  Perform blood tests EXACTLY as directed.   Quit smoking. Smoking increases your  cardiac and lung (pulmonary) risks.   DO NOT drink alcohol.   DO NOT drink caffeinated drinks (e.g. coffee, soda, chocolate, and leaf teas). You may drink decaffeinated coffee, soda or tea.   If you are overweight, you should choose a reduced calorie diet to lose weight. Please see a registered dietitian if you need more information about healthy weight loss. DO NOT USE DIET PILLS as they may aggravate heart problems.   If you have other heart problems that are causing AFib, you may need to eat a low salt, fat, and cholesterol diet. Your caregiver will tell you if this is necessary.   Exercise every day to improve your physical fitness. Stay active unless advised otherwise.   If your caregiver has given you a follow-up appointment, it is very important to keep that appointment. Not keeping the appointment could result in heart failure or stroke. If there is any problem keeping the appointment, you must call back to this facility for assistance.  SEEK MEDICAL CARE IF:  You notice a change in the rate, rhythm or strength of your heartbeat.   You develop an infection or any other change in your overall health status.  SEEK IMMEDIATE MEDICAL CARE IF:   You develop chest pain, abdominal pain, sweating, weakness or feel sick to your stomach (nausea).   You develop shortness of breath.   You develop swollen feet and ankles.   You develop dizziness, numbness, or weakness of your face or limbs, or any change in vision or speech.  MAKE SURE YOU:   Understand these instructions.   Will watch your condition.   Will get help right away if you are not doing well or get worse.  Document Released: 08/09/2005 Document Revised: 04/21/2011 Document Reviewed: 03/13/2008 [facility] Patient Information 2012 [place], [place].

## 2011-07-14 NOTE — Plan of Care (Signed)
Problem: Phase III Progression Outcomes Goal: Pain controlled on oral analgesia Outcome: Not Applicable Date Met:  07/14/11 No pain reported

## 2011-07-14 NOTE — Discharge Summary (Signed)
DISCHARGE SUMMARY  [name]  MR#: [id number]  DOB:[birth year]  Date of Admission: 07/12/2011 Date of Discharge: 07/14/2011  Attending Physician:KRISHNAN,SENDIL K  Patient's [name], DO, DO  Consults:  none  Discharge Diagnoses: Present on Admission:  .Chest pain .Splenic infarct .PAF (paroxysmal atrial fibrillation) .CARDIOMYOPATHY .Sinoatrial node dysfunction .PACEMAKER, PERMANENT    Current Discharge Medication List    START taking these medications   Details  warfarin (COUMADIN) 2.5 MG tablet Take 1 tablet (2.5 mg total) by mouth daily. Qty: 30 tablet, Refills: 0  Next dose due: Thursday 11/22    CONTINUE these medications which have NOT CHANGED   Details  Ascorbic Acid (VITAMIN C) 1000 MG tablet Take 1,000 mg by mouth daily.      aspirin 81 MG EC tablet Take 81 mg by mouth daily.      atorvastatin (LIPITOR) 10 MG tablet TAKE 1 TABLET BY MOUTH AT BEDTIME Qty: 30 tablet, Refills: 5    beclomethasone (QVAR) 80 MCG/ACT inhaler Inhale 2 puffs into the lungs 2 (two) times daily. Qty: 1 Inhaler, Refills: 3    budesonide (RHINOCORT AQUA) 32 MCG/ACT nasal spray 1 spray each nostril qd Qty: 1 Bottle, Refills: 5   Associated Diagnoses: Seasonal allergies    Calcium Carbonate (CALCIUM 600) 1500 MG TABS Take 1 tablet by mouth daily.      Cholecalciferol (VITAMIN D3) 1000 UNITS CAPS Take 1 capsule by mouth daily.      esomeprazole (NEXIUM) 40 MG capsule Take 1 capsule (40 mg total) by mouth daily before breakfast. Qty: 30 capsule, Refills: 11   Associated Diagnoses: GERD (gastroesophageal reflux disease)    fexofenadine (ALLEGRA) 180 MG tablet Take 180 mg by mouth daily.     LEXAPRO 10 MG tablet TAKE 1 TABLET EVERY DAY Qty: 30 tablet, Refills: 2    Multiple Vitamin (MULTIVITAMIN) tablet Take 1 tablet by mouth daily.      SPIRIVA HANDIHALER 18 MCG inhalation capsule PLACE 1 CAPSULE (18 MCG TOTAL) INTO INHALER AND INHALE DAILY. Qty: 30 each, Refills:  3    traZODone (DESYREL) 100 MG tablet 2 po qhs Qty: 60 tablet, Refills: 2   Associated Diagnoses: Insomnia          Hospital Course: Present on Admission:  .Chest pain: Patient is a 76 year old white female with past history is sinoatrial dysfunction status post pacemaker who presented had been on Coumadin. I suspect she also had a previous history of atrial fibrillation. She came in complaining of chest pain which, which was initially thought to be possibly cardiac. Enzymes x3 were negative however as her admission progressed and she was further evaluated her pain is actually more located in the left upper quadrant. During her initial workup a CT scan of the chest was done to rule out pulmonary embolus this was negative however the lower border of her CT noted a possible splenic infarct. Next  .Splenic infarct: Patient had a CT angiogram with and without contrast of the abdominal cavity which it did indeed confirm a splenic infarct. In addition mesenteric vessels were examined and while there was some plaque no signs of significant stenosis was noted. Patient also underwent a 2-D echocardiogram, which noted preserved ejection fraction but was otherwise unremarkable. No signs of clot was noticed.  After discussion with cardiology, it was felt likely patient does have paroxysmal atrial fibrillation which are being the most common cause a splenic infarct in this case without any other findings. Patient previously had been on Coumadin in the  past and to switch over to a pacemaker. Following discovery of the splenic infarct, she was changed over to Coumadin plus Lovenox. She seems as tolerated this well and her INR is at 2.01, although discussion pharmacy notes that despite the rapid rise she's not yet fully anticoagulated. The tissue down normal sinus rhythm during this hospitalization, we feel it is safe to discharge her home if she is a low moderate risk for an embolic CVA immediately. However, it  is important that she stay on Coumadin for long as she can to prevent any further embolic events, especially CVA. After discussion with pharmacy and the patient, the patient will go home after receiving 1 mg of Coumadin this evening and then 2.5 tomorrow which is Thanksgiving day. She was 74.5 on Friday the day after Thanksgiving, and then followup in the Coumadin clinic that day for a repeat INR check.  Marland KitchenPAF (paroxysmal atrial fibrillation): As above.  Marland KitchenCARDIOMYOPATHY .Sinoatrial node dysfunction .PACEMAKER, PERMANENT: Stable during his hospitalization.   Day of Discharge BP 138/76  Pulse 82  Temp(Src) 97.5 F (36.4 C) (Oral)  Resp 20  Ht 4\' 11"  (1.499 m)  Wt 60.5 kg (133 lb 6.1 oz)  BMI 26.94 kg/m2  SpO2 92%  Physical Exam: alert oriented,  in no apparent distress.  Heart has a regular rate and rhythm no murmurs rubs or gallops  Lungs are clear to auscultation bilaterally with no wheezes or crackles  Abdomen distended slightly firm, normal bowel sounds, tender to palpation in the left upper quadrant,   Extremities show no clubbing cyanosis or edema   Results for orders placed during the hospital encounter of 07/12/11 (from the past 24 hour(s))  CARDIAC PANEL(CRET KIN+CKTOT+MB+TROPI)     Status: Normal   Collection Time   07/13/11  5:11 PM      Component Value Range   Total CK 47  7 - 177 (U/L)   CK, MB 2.3  0.3 - 4.0 (ng/mL)   Troponin I <0.30  <0.30 (ng/mL)   Relative Index RELATIVE INDEX IS INVALID  0.0 - 2.5   PROTIME-INR     Status: Abnormal   Collection Time   07/14/11 12:53 AM      Component Value Range   Prothrombin Time 21.5 (*) 11.6 - 15.2 (seconds)   INR 1.83 (*) 0.00 - 1.49   PROTIME-INR     Status: Abnormal   Collection Time   07/14/11  6:30 AM      Component Value Range   Prothrombin Time 23.1 (*) 11.6 - 15.2 (seconds)   INR 2.01 (*) 0.00 - 1.49   CBC     Status: Normal   Collection Time   07/14/11  6:30 AM      Component Value Range   WBC 7.6  4.0 -  10.5 (K/uL)   RBC 4.31  3.87 - 5.11 (MIL/uL)   Hemoglobin 12.5  12.0 - 15.0 (g/dL)   HCT 16.1  09.6 - 04.5 (%)   MCV 87.0  78.0 - 100.0 (fL)   MCH 29.0  26.0 - 34.0 (pg)   MCHC 33.3  30.0 - 36.0 (g/dL)   RDW 40.9  81.1 - 91.4 (%)   Platelets 325  150 - 400 (K/uL)  BASIC METABOLIC PANEL     Status: Abnormal   Collection Time   07/14/11  6:30 AM      Component Value Range   Sodium 133 (*) 135 - 145 (mEq/L)   Potassium 4.9  3.5 - 5.1 (mEq/L)  Chloride 102  96 - 112 (mEq/L)   CO2 23  19 - 32 (mEq/L)   Glucose, Bld 97  70 - 99 (mg/dL)   BUN 14  6 - 23 (mg/dL)   Creatinine, Ser 1.61  0.50 - 1.10 (mg/dL)   Calcium 9.2  8.4 - 09.6 (mg/dL)   GFR calc non Af Amer 84 (*) >90 (mL/min)   GFR calc Af Amer >90  >90 (mL/min)    Disposition: Improved, discharged home   Follow-up Appts: Discharge Orders    Future Appointments: Provider: Department: Dept Phone: Center:   08/11/2011 3:40 PM [facility] [phone] [facility]   08/26/2011 11:20 AM Amber Caryl Bis, RN [facility] [phone] [facility]     Future Orders Please Complete By Expires   Diet - low sodium heart healthy      Increase activity slowly         Follow-up with Dr.Lowne, PCP in 2 weeks. The patient will have a repeat INR check at the [facility] Coumadin clinic which she has been to previous in the past.  Tests Needing Follow-up: As above, she will need her INRs followed closely initially.  SignedHollice Espy 07/14/2011, 4:23 PM

## 2011-07-14 NOTE — Progress Notes (Signed)
ANTICOAGULATION CONSULT NOTE - Follow Up Consult  Pharmacy Consult for Coumadin, also on Lovenox per MD dosing Indication: atrial fibrillation and splenic infarct  Allergies  Allergen Reactions  . Benzoyl Peroxide     REACTION: unspecified  . Triple Antibiotic     REACTION: unspecified    Patient Measurements: Height: 4\' 11"  (149.9 cm) Weight: 133 lb 6.1 oz (60.5 kg) IBW/kg (Calculated) : 43.2   Vital Signs: Temp: 97.5 F (36.4 C) (11/21 1332) Temp src: Oral (11/21 1332) BP: 138/76 mmHg (11/21 1332) Pulse Rate: 82  (11/21 1332)  Labs:  Basename 07/14/11 0630 07/14/11 0053 07/13/11 1711 07/13/11 0743 07/13/11 0002 07/12/11 1522  HGB 12.5 -- -- 13.6 -- --  HCT 37.5 -- -- 40.2 -- 38.9  PLT 325 -- -- 345 -- 326  APTT -- -- -- -- -- --  LABPROT 23.1* 21.5* -- 14.3 -- --  INR 2.01* 1.83* -- 1.09 -- --  HEPARINUNFRC -- -- -- -- -- --  CREATININE 0.68 -- -- 0.62 -- 0.54  CKTOTAL -- -- 47 40 41 --  CKMB -- -- 2.3 2.4 2.2 --  TROPONINI -- -- <0.30 <0.30 <0.30 --   Estimated Creatinine Clearance: 48.8 ml/min (by C-G formula based on Cr of 0.68).   Medications:  Scheduled:    . aspirin  81 mg Oral Daily  . calcium carbonate  1 tablet Oral Daily  . enoxaparin  60 mg Subcutaneous Once  . enoxaparin (LOVENOX) injection  60 mg Subcutaneous Q12H  . escitalopram  10 mg Oral QHS  . fluticasone  2 spray Each Nare Daily  . fluticasone  2 puff Inhalation BID  . loratadine  10 mg Oral Daily  . multivitamins ther. w/minerals  1 tablet Oral Daily  . pantoprazole  40 mg Oral Daily  . simvastatin  20 mg Oral Daily  . tiotropium  18 mcg Inhalation Daily  . traZODone  200 mg Oral QHS  . vitamin C  1,000 mg Oral Daily  . warfarin  5 mg Oral ONCE-1800  . warfarin   Does not apply Once    Assessment: 76 yo F on Lovenox to bridge Coumadin for Afib and splenic infarct. Patient received 10 mg of Coumadin yesterday (5 mg at 02:30 and 5 mg at 17:30) and INR is now 2.01. Coumadin has not  fully blocked vitamin K dependent clotting factors despite the appearance of a therapeutic INR.  Goal of Therapy:  INR 2-3   Plan:  1. Coumadin 1 mg po tonight 2. Consider continuing Lovenox bridge although INR appears therapeutic. 3. INR daily  Loura Back Danielle 07/14/2011,2:28 PM

## 2011-07-16 ENCOUNTER — Ambulatory Visit (INDEPENDENT_AMBULATORY_CARE_PROVIDER_SITE_OTHER): Payer: Medicare PPO | Admitting: *Deleted

## 2011-07-16 DIAGNOSIS — D735 Infarction of spleen: Secondary | ICD-10-CM

## 2011-07-16 DIAGNOSIS — I4891 Unspecified atrial fibrillation: Secondary | ICD-10-CM

## 2011-07-16 DIAGNOSIS — I480 Paroxysmal atrial fibrillation: Secondary | ICD-10-CM

## 2011-07-16 DIAGNOSIS — D7389 Other diseases of spleen: Secondary | ICD-10-CM

## 2011-07-16 DIAGNOSIS — Z7901 Long term (current) use of anticoagulants: Secondary | ICD-10-CM

## 2011-07-19 LAB — CULTURE, BLOOD (ROUTINE X 2)
Culture  Setup Time: 201211200407
Culture: NO GROWTH

## 2011-07-22 ENCOUNTER — Other Ambulatory Visit: Payer: Self-pay | Admitting: Family Medicine

## 2011-07-22 ENCOUNTER — Ambulatory Visit (INDEPENDENT_AMBULATORY_CARE_PROVIDER_SITE_OTHER): Payer: Medicare PPO | Admitting: *Deleted

## 2011-07-22 DIAGNOSIS — I480 Paroxysmal atrial fibrillation: Secondary | ICD-10-CM

## 2011-07-22 DIAGNOSIS — D7389 Other diseases of spleen: Secondary | ICD-10-CM

## 2011-07-22 DIAGNOSIS — Z7901 Long term (current) use of anticoagulants: Secondary | ICD-10-CM

## 2011-07-22 DIAGNOSIS — D735 Infarction of spleen: Secondary | ICD-10-CM

## 2011-07-22 DIAGNOSIS — I4891 Unspecified atrial fibrillation: Secondary | ICD-10-CM

## 2011-07-22 DIAGNOSIS — J302 Other seasonal allergic rhinitis: Secondary | ICD-10-CM

## 2011-07-22 MED ORDER — BUDESONIDE 32 MCG/ACT NA SUSP: NASAL | Status: DC

## 2011-07-22 NOTE — Telephone Encounter (Signed)
Rx faxed.    KP 

## 2011-07-27 ENCOUNTER — Encounter: Payer: Self-pay | Admitting: Family Medicine

## 2011-07-27 ENCOUNTER — Ambulatory Visit (INDEPENDENT_AMBULATORY_CARE_PROVIDER_SITE_OTHER): Payer: Medicare PPO | Admitting: Family Medicine

## 2011-07-27 VITALS — BP 116/68 | HR 84 | Temp 98.3°F | Wt 137.8 lb

## 2011-07-27 DIAGNOSIS — T82868A Thrombosis of vascular prosthetic devices, implants and grafts, initial encounter: Secondary | ICD-10-CM

## 2011-07-27 DIAGNOSIS — T82898A Other specified complication of vascular prosthetic devices, implants and grafts, initial encounter: Secondary | ICD-10-CM

## 2011-07-27 NOTE — Progress Notes (Signed)
  Subjective:    Patient ID: [name], female    DOB: 1934/09/27, 76 y.o.   MRN: [id number]  HPI  Pt here to f/u from hospital---pt had Splenic artery infarct and is on coumadin.   Pt still feels tired at times but overall doing better.   Review of Systems As above    Objective:   Physical Exam  Constitutional: She appears well-developed and well-nourished.  Psychiatric: She has a normal mood and affect. Her behavior is normal. Judgment and thought content normal.        Assessment & Plan:  Splenic artery infrct----  On coumadin Pacemaker-- per cardiology

## 2011-08-02 ENCOUNTER — Ambulatory Visit (INDEPENDENT_AMBULATORY_CARE_PROVIDER_SITE_OTHER): Payer: Medicare PPO | Admitting: *Deleted

## 2011-08-02 DIAGNOSIS — I480 Paroxysmal atrial fibrillation: Secondary | ICD-10-CM

## 2011-08-02 DIAGNOSIS — D735 Infarction of spleen: Secondary | ICD-10-CM

## 2011-08-02 DIAGNOSIS — Z7901 Long term (current) use of anticoagulants: Secondary | ICD-10-CM

## 2011-08-02 DIAGNOSIS — D7389 Other diseases of spleen: Secondary | ICD-10-CM

## 2011-08-02 DIAGNOSIS — I4891 Unspecified atrial fibrillation: Secondary | ICD-10-CM

## 2011-08-02 LAB — POCT INR: INR: 1.8

## 2011-08-03 ENCOUNTER — Other Ambulatory Visit: Payer: Self-pay | Admitting: Family Medicine

## 2011-08-03 MED ORDER — ESCITALOPRAM OXALATE 10 MG PO TABS: 10.0000 mg | ORAL_TABLET | Freq: Every day | ORAL | Status: DC

## 2011-08-03 NOTE — Telephone Encounter (Signed)
Rx faxed.    KP 

## 2011-08-09 ENCOUNTER — Other Ambulatory Visit: Payer: Self-pay | Admitting: Emergency Medicine

## 2011-08-09 ENCOUNTER — Other Ambulatory Visit: Payer: Self-pay | Admitting: Internal Medicine

## 2011-08-09 MED ORDER — WARFARIN SODIUM 2.5 MG PO TABS: 2.5000 mg | ORAL_TABLET | Freq: Every day | ORAL | Status: DC

## 2011-08-11 ENCOUNTER — Ambulatory Visit
Admission: RE | Admit: 2011-08-11 | Discharge: 2011-08-11 | Disposition: A | Discharge status: Home / Self Care | Payer: Medicare HMO | Source: Ambulatory Visit | Attending: Family Medicine | Admitting: Family Medicine

## 2011-08-11 DIAGNOSIS — Z1231 Encounter for screening mammogram for malignant neoplasm of breast: Secondary | ICD-10-CM

## 2011-08-19 ENCOUNTER — Ambulatory Visit (INDEPENDENT_AMBULATORY_CARE_PROVIDER_SITE_OTHER): Payer: Medicare HMO | Admitting: *Deleted

## 2011-08-19 DIAGNOSIS — I4891 Unspecified atrial fibrillation: Secondary | ICD-10-CM

## 2011-08-19 DIAGNOSIS — Z7901 Long term (current) use of anticoagulants: Secondary | ICD-10-CM

## 2011-08-19 DIAGNOSIS — D735 Infarction of spleen: Secondary | ICD-10-CM

## 2011-08-19 DIAGNOSIS — D7389 Other diseases of spleen: Secondary | ICD-10-CM

## 2011-08-19 DIAGNOSIS — I480 Paroxysmal atrial fibrillation: Secondary | ICD-10-CM

## 2011-08-26 ENCOUNTER — Encounter: Payer: Medicare HMO | Admitting: *Deleted

## 2011-08-30 ENCOUNTER — Encounter: Payer: Self-pay | Admitting: *Deleted

## 2011-08-31 ENCOUNTER — Encounter: Payer: Self-pay | Admitting: Internal Medicine

## 2011-08-31 ENCOUNTER — Ambulatory Visit (INDEPENDENT_AMBULATORY_CARE_PROVIDER_SITE_OTHER): Payer: Medicare HMO | Admitting: *Deleted

## 2011-08-31 ENCOUNTER — Other Ambulatory Visit: Payer: Self-pay | Admitting: Internal Medicine

## 2011-08-31 DIAGNOSIS — I480 Paroxysmal atrial fibrillation: Secondary | ICD-10-CM

## 2011-08-31 DIAGNOSIS — I4891 Unspecified atrial fibrillation: Secondary | ICD-10-CM

## 2011-08-31 DIAGNOSIS — Z950 Presence of cardiac pacemaker: Secondary | ICD-10-CM

## 2011-08-31 DIAGNOSIS — I498 Other specified cardiac arrhythmias: Secondary | ICD-10-CM

## 2011-09-02 ENCOUNTER — Ambulatory Visit (INDEPENDENT_AMBULATORY_CARE_PROVIDER_SITE_OTHER): Payer: Medicare HMO | Admitting: *Deleted

## 2011-09-02 DIAGNOSIS — I480 Paroxysmal atrial fibrillation: Secondary | ICD-10-CM

## 2011-09-02 DIAGNOSIS — D735 Infarction of spleen: Secondary | ICD-10-CM

## 2011-09-02 DIAGNOSIS — D7389 Other diseases of spleen: Secondary | ICD-10-CM

## 2011-09-02 DIAGNOSIS — I4891 Unspecified atrial fibrillation: Secondary | ICD-10-CM

## 2011-09-02 DIAGNOSIS — Z7901 Long term (current) use of anticoagulants: Secondary | ICD-10-CM

## 2011-09-05 LAB — REMOTE PACEMAKER DEVICE
AL THRESHOLD: 0.625 V
ATRIAL PACING PM: 11
BAMS-0001: 175 {beats}/min
RV LEAD THRESHOLD: 1.125 V

## 2011-09-09 NOTE — Progress Notes (Signed)
Remote pacer check  

## 2011-09-14 ENCOUNTER — Encounter: Payer: Self-pay | Admitting: *Deleted

## 2011-09-16 ENCOUNTER — Ambulatory Visit (INDEPENDENT_AMBULATORY_CARE_PROVIDER_SITE_OTHER): Payer: Medicare HMO

## 2011-09-16 DIAGNOSIS — D735 Infarction of spleen: Secondary | ICD-10-CM

## 2011-09-16 DIAGNOSIS — D7389 Other diseases of spleen: Secondary | ICD-10-CM

## 2011-09-16 DIAGNOSIS — Z7901 Long term (current) use of anticoagulants: Secondary | ICD-10-CM

## 2011-09-16 DIAGNOSIS — I480 Paroxysmal atrial fibrillation: Secondary | ICD-10-CM

## 2011-09-16 DIAGNOSIS — I4891 Unspecified atrial fibrillation: Secondary | ICD-10-CM

## 2011-09-16 LAB — POCT INR: INR: 2.7

## 2011-09-27 ENCOUNTER — Other Ambulatory Visit: Payer: Self-pay | Admitting: Family Medicine

## 2011-09-27 DIAGNOSIS — J302 Other seasonal allergic rhinitis: Secondary | ICD-10-CM

## 2011-09-27 MED ORDER — BUDESONIDE 32 MCG/ACT NA SUSP: NASAL | Status: DC

## 2011-09-27 NOTE — Telephone Encounter (Signed)
Faxed.   KP 

## 2011-10-06 ENCOUNTER — Ambulatory Visit (INDEPENDENT_AMBULATORY_CARE_PROVIDER_SITE_OTHER): Payer: Medicare HMO | Admitting: Pharmacist

## 2011-10-06 DIAGNOSIS — D735 Infarction of spleen: Secondary | ICD-10-CM

## 2011-10-06 DIAGNOSIS — D7389 Other diseases of spleen: Secondary | ICD-10-CM

## 2011-10-06 DIAGNOSIS — I480 Paroxysmal atrial fibrillation: Secondary | ICD-10-CM

## 2011-10-06 DIAGNOSIS — Z7901 Long term (current) use of anticoagulants: Secondary | ICD-10-CM

## 2011-10-06 DIAGNOSIS — I4891 Unspecified atrial fibrillation: Secondary | ICD-10-CM

## 2011-10-06 LAB — POCT INR: INR: 2.5

## 2011-10-07 ENCOUNTER — Ambulatory Visit (INDEPENDENT_AMBULATORY_CARE_PROVIDER_SITE_OTHER): Payer: Medicare HMO | Admitting: Emergency Medicine

## 2011-10-07 ENCOUNTER — Encounter: Payer: Self-pay | Admitting: Emergency Medicine

## 2011-10-07 DIAGNOSIS — J310 Chronic rhinitis: Secondary | ICD-10-CM

## 2011-10-07 DIAGNOSIS — J479 Bronchiectasis, uncomplicated: Secondary | ICD-10-CM

## 2011-10-07 MED ORDER — TIOTROPIUM BROMIDE MONOHYDRATE 18 MCG IN CAPS: 18.0000 ug | ORAL_CAPSULE | Freq: Every day | RESPIRATORY_TRACT | Status: DC

## 2011-10-07 MED ORDER — BECLOMETHASONE DIPROPIONATE 80 MCG/ACT IN AERS: 2.0000 | INHALATION_SPRAY | Freq: Two times a day (BID) | RESPIRATORY_TRACT | Status: DC

## 2011-10-07 NOTE — Progress Notes (Signed)
Ms. Cutbirth is a 77 year old woman with a history of mild bronchiectasis and associated mild airflow limitation. She also has allergic rhinitis and postnasal drip. Taking fexofenadine and Rhinocort. Also on Spiriva + QVAR.   ROV 01/28/10 -- returns for f/u of her allergies, bronchiectasis, AFL. Has been seen by Dr Maple Hudson for allergy testing, skin testing negative. Nasal gtt is bothersome when she is outside and at night, white mucous. She is planning to have repeat skin testing Sept 1. Remains on QVAR and Spiriva. Also on fexafenadine + Rhinocort.   ROV 07/29/10 -- f/u for bronchiectasis, mild AFL, chronic rhinitis. She has been seen by Dr Maple Hudson, has negative skin testing since last visit. Has been on loratadine, allegra/fexofenadine, clarinex in the past. She is currently on name brand Allegra, causes her mouth and nose to be more dry, more nose bleeding. Cough stable. She feels more tired on the Allegra. Still on QVAR + Spiriva - although not clear that she has significant AFL, she believes she benefits.   ROV 04/01/11 -- bronchiectasis, mild AFL, chronic rhinitis with negative allergy w/u. She is on QVAR + Spiriva - although not clear that she has significant AFL, she believes she benefits. She was just at the beach - made her nasal gtt much worse. She remains on Rhinocort, but is unable to take allegra, loratadine, etc. She states that her DOE is slightly worse.   ROV 10/07/11 --  bronchiectasis, mild AFL, chronic rhinitis with negative allergy w/u. She is on QVAR + Spiriva.  She indicates that for the last few weeks she has had more allergic sx, more drainage, itchy eyes, etc. Bothers her at night when lying flat. She is on Rhinocort, went back on allegra last time. Dyspnea is stable.     Gen: Pleasant, well-nourished, in no distress,  normal affect  ENT: No lesions,  mouth clear,  oropharynx clear, no postnasal drip, hoarse voice  Neck: No JVD, no TMG, no carotid bruits  Lungs: No use of accessory  muscles, few insp crackles L base  Cardiovascular: RRR, heart sounds normal, no murmur or gallops, no peripheral edema  Musculoskeletal: No deformities, no cyanosis or clubbing  Neuro: alert, non focal  Skin: Warm, no lesions or rashes   RHINITIS Will stop allegra and retry loratadine qd Start nasal saline washes w baking soda Continue the rhinocort   BRONCHIECTASIS Continue the spiriva and QVAR

## 2011-10-07 NOTE — Assessment & Plan Note (Signed)
Will stop allegra and retry loratadine qd Start nasal saline washes w baking soda Continue the rhinocort

## 2011-10-07 NOTE — Patient Instructions (Signed)
Please continue your QVAR and Spiriva Stop allegra Retry loratadine 10mg  daily (Claritin) Continue your Rhinocort Start using Nasal Saline washes every other day. Use the saline that has bicarbonate in it.  Follow with Dr Delton Coombes in 6 months or sooner if you have any problems

## 2011-10-07 NOTE — Assessment & Plan Note (Signed)
Continue the spiriva and QVAR

## 2011-10-14 ENCOUNTER — Other Ambulatory Visit: Payer: Self-pay | Admitting: *Deleted

## 2011-10-14 DIAGNOSIS — G4700 Insomnia, unspecified: Secondary | ICD-10-CM

## 2011-10-14 MED ORDER — TRAZODONE HCL 100 MG PO TABS: ORAL_TABLET | ORAL | Status: DC

## 2011-10-14 NOTE — Telephone Encounter (Signed)
Refill x1   2 refills 

## 2011-10-14 NOTE — Telephone Encounter (Signed)
Rx faxed.    KP 

## 2011-10-14 NOTE — Telephone Encounter (Signed)
Last OV 07-27-11, last filled 06-21-11 #60

## 2011-11-04 ENCOUNTER — Ambulatory Visit (INDEPENDENT_AMBULATORY_CARE_PROVIDER_SITE_OTHER): Payer: Medicare HMO | Admitting: Pharmacist

## 2011-11-04 DIAGNOSIS — D735 Infarction of spleen: Secondary | ICD-10-CM

## 2011-11-04 DIAGNOSIS — I4891 Unspecified atrial fibrillation: Secondary | ICD-10-CM

## 2011-11-04 DIAGNOSIS — Z7901 Long term (current) use of anticoagulants: Secondary | ICD-10-CM

## 2011-11-04 DIAGNOSIS — I480 Paroxysmal atrial fibrillation: Secondary | ICD-10-CM

## 2011-11-04 DIAGNOSIS — D7389 Other diseases of spleen: Secondary | ICD-10-CM

## 2011-11-09 ENCOUNTER — Other Ambulatory Visit: Payer: Self-pay | Admitting: Family Medicine

## 2011-11-15 ENCOUNTER — Encounter: Payer: Self-pay | Admitting: Internal Medicine

## 2011-11-15 ENCOUNTER — Ambulatory Visit (INDEPENDENT_AMBULATORY_CARE_PROVIDER_SITE_OTHER): Payer: Medicare HMO | Admitting: Internal Medicine

## 2011-11-15 VITALS — BP 133/84 | HR 82 | Ht 59.0 in | Wt 140.1 lb

## 2011-11-15 DIAGNOSIS — I480 Paroxysmal atrial fibrillation: Secondary | ICD-10-CM

## 2011-11-15 DIAGNOSIS — I495 Sick sinus syndrome: Secondary | ICD-10-CM

## 2011-11-15 DIAGNOSIS — I4891 Unspecified atrial fibrillation: Secondary | ICD-10-CM

## 2011-11-15 DIAGNOSIS — I498 Other specified cardiac arrhythmias: Secondary | ICD-10-CM

## 2011-11-15 DIAGNOSIS — Z950 Presence of cardiac pacemaker: Secondary | ICD-10-CM

## 2011-11-15 LAB — PACEMAKER DEVICE OBSERVATION
BATTERY VOLTAGE: 2.77 V
BMOD-0003RV: 30
RV LEAD AMPLITUDE: 15.67 mv
RV LEAD IMPEDENCE PM: 473 Ohm
RV LEAD THRESHOLD: 1 V
VENTRICULAR PACING PM: 95

## 2011-11-15 NOTE — Progress Notes (Signed)
HPI  [name] is a 77 y.o. female [name] in followup he with a history of tako-tsubo nonischemic cardiomyopathy in 9/07 which has resolved. She also has a history of atrial flutter and is status post ablation as well as placement of a permanent pacemaker due to profound sinus node dysfunction.   The patient denies SOB, chest pain edema or palpitations.  There has been no syncope or presyncope.  Her biggest issue is wieght gain.      Past Medical History  Diagnosis Date  . Asthma   . Rhinitis   . Osteoarthritis   . Osteoporosis   . Peptic ulcer disease   . Depression   . Atrial fibrillation     s/p ablation   . Takotsubo cardiomyopathy     resolved  . Shingles   . Eosinophilia   . Cardiomyopathy   . Sinoatrial node dysfunction   . Atrial arrhythmia   . Sinusitis   . Rhinitis   . GERD (gastroesophageal reflux disease)   . Bronchiectasis   . Peptic ulcer disease   . Osteoarthritis   . Herpes zoster   . Hyperlipemia   . Injury of splenic artery     infarct    Past Surgical History  Procedure Date  . Cholecystectomy   . Tonsillectomy   . Cataract extraction   . Pacemaker insertion 09/29/06  . Cosmetic surgery     Current Outpatient Prescriptions  Medication Sig Dispense Refill  . Ascorbic Acid (VITAMIN C) 1000 MG tablet Take 1,000 mg by mouth daily.        [name] aspirin 81 MG EC tablet Take 81 mg by mouth daily.        [name] atorvastatin (LIPITOR) 10 MG tablet TAKE 1 TABLET BY MOUTH AT BEDTIME  30 tablet  0  . beclomethasone (QVAR) 80 MCG/ACT inhaler Inhale 2 puffs into the lungs 2 (two) times daily.  8.7 g  5  . budesonide (RHINOCORT AQUA) 32 MCG/ACT nasal spray 1 spray each nostril qd  1 Bottle  1  . Calcium Carbonate (CALCIUM 600) 1500 MG TABS Take 1 tablet by mouth daily.        . Cholecalciferol (VITAMIN D3) 1000 UNITS CAPS Take 1 capsule by mouth daily.        [name] escitalopram (LEXAPRO) 10 MG tablet Take 1 tablet (10 mg total) by mouth daily.  30 tablet  2  .  esomeprazole (NEXIUM) 40 MG capsule Take 1 capsule (40 mg total) by mouth daily before breakfast.  30 capsule  11  . loratadine (CLARITIN) 10 MG tablet Take 10 mg by mouth daily.      . Multiple Vitamin (MULTIVITAMIN) tablet Take 1 tablet by mouth daily.        [name] tiotropium (SPIRIVA HANDIHALER) 18 MCG inhalation capsule Place 1 capsule (18 mcg total) into inhaler and inhale daily.  30 capsule  5  . traZODone (DESYREL) 100 MG tablet 2 po qhs  60 tablet  2  . warfarin (COUMADIN) 2.5 MG tablet Take 2.5 mg by mouth daily. Sunday 2.5mg , mon 1/2 tab, tues 2.5, wed 1/2 pill, thurs 2.5, fri 1/2 pill, and sat 1/2 pill        Allergies  Allergen Reactions  . Benzoyl Peroxide     REACTION: unspecified  . Triple Antibiotic     REACTION: unspecified    Review of Systems negative except from HPI and PMH  Physical Exam BP 133/84  Pulse 82  Ht 4\' 11"  (1.499 m)  Wt 140 lb 1.9 oz (63.558 kg)  BMI 28.30 kg/m2 Well developed and well nourished in no acute distress HENT normal E scleral and icterus clear Neck Supple JVP flat; carotids brisk and full Clear to ausculation  Regular rate and rhythm, no murmurs gallops or rub Soft with active bowel sounds No clubbing cyanosis none Edema Alert and oriented, grossly normal motor and sensory function Skin Warm and Dry   Assessment and  Plan

## 2011-11-15 NOTE — Assessment & Plan Note (Signed)
Now with significant conduction system disease and 100% ventricular pacing

## 2011-11-15 NOTE — Patient Instructions (Addendum)
Remote monitoring is used to monitor your Pacemaker of ICD from home. This monitoring reduces the number of office visits required to check your device to one time per year. It allows [name] to keep an eye on the functioning of your device to ensure it is working properly. You are scheduled for a device check from home on February 17, 2012. You may send your transmission at any time that day. If you have a wireless device, the transmission will be sent automatically. After your physician reviews your transmission, you will receive a postcard with your next transmission date.  Your physician wants you to follow-up in: 1 year with Dr Graciela Husbands.  You will receive a reminder letter in the mail two months in advance. If you don't receive a letter, please call our office to schedule the follow-up appointment.  Your physician has recommended you make the following change in your medication:  1) Stop aspirin.

## 2011-11-15 NOTE — Assessment & Plan Note (Signed)
The patient's device was interrogated.  The information was reviewed. The device was reprogrammed permanently to VVR .

## 2011-11-15 NOTE — Assessment & Plan Note (Signed)
Permanent atrial fibrillation. On warfarin. We'll discontinue aspirin

## 2011-11-22 ENCOUNTER — Other Ambulatory Visit: Payer: Self-pay | Admitting: Family Medicine

## 2011-12-16 ENCOUNTER — Ambulatory Visit (INDEPENDENT_AMBULATORY_CARE_PROVIDER_SITE_OTHER): Payer: Medicare HMO | Admitting: *Deleted

## 2011-12-16 DIAGNOSIS — I4891 Unspecified atrial fibrillation: Secondary | ICD-10-CM

## 2011-12-16 DIAGNOSIS — I480 Paroxysmal atrial fibrillation: Secondary | ICD-10-CM

## 2011-12-16 DIAGNOSIS — D735 Infarction of spleen: Secondary | ICD-10-CM

## 2011-12-16 DIAGNOSIS — Z7901 Long term (current) use of anticoagulants: Secondary | ICD-10-CM

## 2011-12-16 DIAGNOSIS — D7389 Other diseases of spleen: Secondary | ICD-10-CM

## 2011-12-16 LAB — POCT INR: INR: 2.6

## 2011-12-21 ENCOUNTER — Ambulatory Visit (INDEPENDENT_AMBULATORY_CARE_PROVIDER_SITE_OTHER): Payer: Medicare HMO | Admitting: Internal Medicine

## 2011-12-21 ENCOUNTER — Encounter: Payer: Self-pay | Admitting: Internal Medicine

## 2011-12-21 VITALS — BP 136/84 | HR 75 | Temp 97.9°F | Wt 142.0 lb

## 2011-12-21 DIAGNOSIS — L0291 Cutaneous abscess, unspecified: Secondary | ICD-10-CM

## 2011-12-21 DIAGNOSIS — T887XXA Unspecified adverse effect of drug or medicament, initial encounter: Secondary | ICD-10-CM | POA: Insufficient documentation

## 2011-12-21 DIAGNOSIS — L0390 Cellulitis, unspecified: Secondary | ICD-10-CM

## 2011-12-21 DIAGNOSIS — L259 Unspecified contact dermatitis, unspecified cause: Secondary | ICD-10-CM

## 2011-12-21 MED ORDER — AMOXICILLIN-POT CLAVULANATE 500-125 MG PO TABS: ORAL_TABLET | ORAL | Status: DC

## 2011-12-21 NOTE — Progress Notes (Signed)
  Subjective:    Patient ID: [name], female    DOB: 1935/03/23, 77 y.o.   MRN: [id number]  HPI Two & a half weeks ago she accidentally cut the skin cutting the cuticle of the right great toe.  Subsequently she noted exfoliation in this area with the appearance of  "raw" tissue. She has used Epsom salts soaks and Polysporin topically.  She did have some swelling of the foot but this has resolved.  When she uses the topical antibiotic she states that the skin "festers".    Review of Systems she denies fever, chills, sweats, or purulent drainage from the toe except some "chilling" related to her Coumadin.  Her PT/INR was 2.6  2 weeks ago; by report it was 2.5  6 weeks prior that.     Objective:   Physical Exam  She appears healthy and well-nourished in no distress  The foot is warm and demonstrated no ischemic changes  Pedal pulses are difficult to palpate but are intact  There is mild deformity and hammertoe nature of the third right toe.  There is exfoliation with redness of the right great toe dorsally. There is faint erythema extending beyond the base of the toe over the foot dorsally. There is no purulence noted        Assessment & Plan:  #1 mild cellulitis of the right great toe; probable component of contact dermatitis related to use of Polysporin  Plan: Antibiotics will be stopped. She will use wet to dry saline dressings and stop Epsom salts soaks. Antibiotics will be ordered.

## 2011-12-21 NOTE — Patient Instructions (Signed)
Dip gauze in  sterile saline and applied to the wound twice a day. Cover the wound with Telfa , non stick dressing  WITHOUT any antibiotic ointment. The saline can be purchased at the drugstore or you can make your own .Boil cup of salt in a gallon of water. Store mixture  in a clean container.Report Warning  signs as discussed (red streaks, pus, fever, increasing pain). PT/INR ( clotting study for warfarin) in 7 days [facility] .

## 2011-12-27 ENCOUNTER — Ambulatory Visit (INDEPENDENT_AMBULATORY_CARE_PROVIDER_SITE_OTHER): Payer: Medicare HMO | Admitting: *Deleted

## 2011-12-27 DIAGNOSIS — Z7901 Long term (current) use of anticoagulants: Secondary | ICD-10-CM

## 2011-12-27 DIAGNOSIS — D735 Infarction of spleen: Secondary | ICD-10-CM

## 2011-12-27 DIAGNOSIS — I480 Paroxysmal atrial fibrillation: Secondary | ICD-10-CM

## 2011-12-27 DIAGNOSIS — I4891 Unspecified atrial fibrillation: Secondary | ICD-10-CM

## 2011-12-27 DIAGNOSIS — D7389 Other diseases of spleen: Secondary | ICD-10-CM

## 2011-12-27 LAB — POCT INR: INR: 2.6

## 2011-12-30 ENCOUNTER — Other Ambulatory Visit: Payer: Self-pay | Admitting: Internal Medicine

## 2012-01-03 ENCOUNTER — Other Ambulatory Visit: Payer: Self-pay | Admitting: Family Medicine

## 2012-01-18 ENCOUNTER — Telehealth: Payer: Self-pay | Admitting: Family Medicine

## 2012-01-18 DIAGNOSIS — G4700 Insomnia, unspecified: Secondary | ICD-10-CM

## 2012-01-18 MED ORDER — TRAZODONE HCL 100 MG PO TABS: ORAL_TABLET | ORAL | Status: DC

## 2012-01-18 NOTE — Telephone Encounter (Signed)
Refill: Trazadone 100mg  tablet. Take 2 tablets by mouth at bedtime. Qty 60. Last fill 12-16-11

## 2012-01-18 NOTE — Telephone Encounter (Signed)
Last written 10/14/11 #60 with 2 refills she seen Hop 12/21/11. Please advise     KP

## 2012-01-18 NOTE — Telephone Encounter (Signed)
Ok to refill x1  2 refills 

## 2012-01-26 ENCOUNTER — Ambulatory Visit (INDEPENDENT_AMBULATORY_CARE_PROVIDER_SITE_OTHER): Payer: Medicare HMO | Admitting: Family Medicine

## 2012-01-26 ENCOUNTER — Encounter: Payer: Self-pay | Admitting: Family Medicine

## 2012-01-26 VITALS — BP 120/84 | HR 80 | Temp 98.3°F | Wt 142.4 lb

## 2012-01-26 DIAGNOSIS — E785 Hyperlipidemia, unspecified: Secondary | ICD-10-CM

## 2012-01-26 DIAGNOSIS — M25569 Pain in unspecified knee: Secondary | ICD-10-CM

## 2012-01-26 MED ORDER — TRAMADOL HCL 50 MG PO TABS: 50.0000 mg | ORAL_TABLET | Freq: Three times a day (TID) | ORAL | Status: AC | PRN

## 2012-01-26 MED ORDER — ATORVASTATIN CALCIUM 10 MG PO TABS: ORAL_TABLET | ORAL | Status: DC

## 2012-01-26 NOTE — Patient Instructions (Signed)
Knee Pain The knee is the complex joint between your thigh and your lower leg. It is made up of bones, tendons, ligaments, and cartilage. The bones that make up the knee are:  The femur in the thigh.   The tibia and fibula in the lower leg.   The patella or kneecap riding in the groove on the lower femur.  CAUSES  Knee pain is a common complaint with many causes. A few of these causes are:  Injury, such as:   A ruptured ligament or tendon injury.   Torn cartilage.   Medical conditions, such as:   Gout   Arthritis   Infections   Overuse, over training or overdoing a physical activity.  Knee pain can be minor or severe. Knee pain can accompany debilitating injury. Minor knee problems often respond well to self-care measures or get well on their own. More serious injuries may need medical intervention or even surgery. SYMPTOMS The knee is complex. Symptoms of knee problems can vary widely. Some of the problems are:  Pain with movement and weight bearing.   Swelling and tenderness.   Buckling of the knee.   Inability to straighten or extend your knee.   Your knee locks and you cannot straighten it.   Warmth and redness with pain and fever.   Deformity or dislocation of the kneecap.  DIAGNOSIS  Determining what is wrong may be very straight forward such as when there is an injury. It can also be challenging because of the complexity of the knee. Tests to make a diagnosis may include:  Your caregiver taking a history and doing a physical exam.   Routine X-rays can be used to rule out other problems. X-rays will not reveal a cartilage tear. Some injuries of the knee can be diagnosed by:   Arthroscopy a surgical technique by which a small video camera is inserted through tiny incisions on the sides of the knee. This procedure is used to examine and repair internal knee joint problems. Tiny instruments can be used during arthroscopy to repair the torn knee cartilage  (meniscus).   Arthrography is a radiology technique. A contrast liquid is directly injected into the knee joint. Internal structures of the knee joint then become visible on X-ray film.   An MRI scan is a non x-ray radiology procedure in which magnetic fields and a computer produce two- or three-dimensional images of the inside of the knee. Cartilage tears are often visible using an MRI scanner. MRI scans have largely replaced arthrography in diagnosing cartilage tears of the knee.   Blood work.   Examination of the fluid that helps to lubricate the knee joint (synovial fluid). This is done by taking a sample out using a needle and a syringe.  TREATMENT The treatment of knee problems depends on the cause. Some of these treatments are:  Depending on the injury, proper casting, splinting, surgery or physical therapy care will be needed.   Give yourself adequate recovery time. Do not overuse your joints. If you begin to get sore during workout routines, back off. Slow down or do fewer repetitions.   For repetitive activities such as cycling or running, maintain your strength and nutrition.   Alternate muscle groups. For example if you are a weight lifter, work the upper body on one day and the lower body the next.   Either tight or weak muscles do not give the proper support for your knee. Tight or weak muscles do not absorb the stress placed   on the knee joint. Keep the muscles surrounding the knee strong.   Take care of mechanical problems.   If you have flat feet, orthotics or special shoes may help. See your caregiver if you need help.   Arch supports, sometimes with wedges on the inner or outer aspect of the heel, can help. These can shift pressure away from the side of the knee most bothered by osteoarthritis.   A brace called an "unloader" brace also may be used to help ease the pressure on the most arthritic side of the knee.   If your caregiver has prescribed crutches, braces,  wraps or ice, use as directed. The acronym for this is PRICE. This means protection, rest, ice, compression and elevation.   Nonsteroidal anti-inflammatory drugs (NSAID's), can help relieve pain. But if taken immediately after an injury, they may actually increase swelling. Take NSAID's with food in your stomach. Stop them if you develop stomach problems. Do not take these if you have a history of ulcers, stomach pain or bleeding from the bowel. Do not take without your caregiver's approval if you have problems with fluid retention, heart failure, or kidney problems.   For ongoing knee problems, physical therapy may be helpful.   Glucosamine and chondroitin are over-the-counter dietary supplements. Both may help relieve the pain of osteoarthritis in the knee. These medicines are different from the usual anti-inflammatory drugs. Glucosamine may decrease the rate of cartilage destruction.   Injections of a corticosteroid drug into your knee joint may help reduce the symptoms of an arthritis flare-up. They may provide pain relief that lasts a few months. You may have to wait a few months between injections. The injections do have a small increased risk of infection, water retention and elevated blood sugar levels.   Hyaluronic acid injected into damaged joints may ease pain and provide lubrication. These injections may work by reducing inflammation. A series of shots may give relief for as long as 6 months.   Topical painkillers. Applying certain ointments to your skin may help relieve the pain and stiffness of osteoarthritis. Ask your pharmacist for suggestions. Many over the-counter products are approved for temporary relief of arthritis pain.   In some countries, doctors often prescribe topical NSAID's for relief of chronic conditions such as arthritis and tendinitis. A review of treatment with NSAID creams found that they worked as well as oral medications but without the serious side effects.    PREVENTION  Maintain a healthy weight. Extra pounds put more strain on your joints.   Get strong, stay limber. Weak muscles are a common cause of knee injuries. Stretching is important. Include flexibility exercises in your workouts.   Be smart about exercise. If you have osteoarthritis, chronic knee pain or recurring injuries, you may need to change the way you exercise. This does not mean you have to stop being active. If your knees ache after jogging or playing basketball, consider switching to swimming, water aerobics or other low-impact activities, at least for a few days a week. Sometimes limiting high-impact activities will provide relief.   Make sure your shoes fit well. Choose footwear that is right for your sport.   Protect your knees. Use the proper gear for knee-sensitive activities. Use kneepads when playing volleyball or laying carpet. Buckle your seat belt every time you drive. Most shattered kneecaps occur in car accidents.   Rest when you are tired.  SEEK MEDICAL CARE IF:  You have knee pain that is continual and does not   seem to be getting better.  SEEK IMMEDIATE MEDICAL CARE IF:  Your knee joint feels hot to the touch and you have a high fever. MAKE SURE YOU:   Understand these instructions.   Will watch your condition.   Will get help right away if you are not doing well or get worse.  Document Released: 06/06/2007 Document Revised: 07/29/2011 Document Reviewed: 06/06/2007 [employer] Patient Information 2012 [employer]. 
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
## 2012-02-03 ENCOUNTER — Encounter: Payer: Self-pay | Admitting: Family Medicine

## 2012-02-03 ENCOUNTER — Ambulatory Visit (INDEPENDENT_AMBULATORY_CARE_PROVIDER_SITE_OTHER): Payer: Medicare HMO | Admitting: Family Medicine

## 2012-02-03 VITALS — BP 116/70 | HR 95 | Temp 98.5°F | Wt 143.8 lb

## 2012-02-03 DIAGNOSIS — M25569 Pain in unspecified knee: Secondary | ICD-10-CM

## 2012-02-03 DIAGNOSIS — M25562 Pain in left knee: Secondary | ICD-10-CM

## 2012-02-03 NOTE — Patient Instructions (Signed)
Knee Pain The knee is the complex joint between your thigh and your lower leg. It is made up of bones, tendons, ligaments, and cartilage. The bones that make up the knee are:  The femur in the thigh.   The tibia and fibula in the lower leg.   The patella or kneecap riding in the groove on the lower femur.  CAUSES  Knee pain is a common complaint with many causes. A few of these causes are:  Injury, such as:   A ruptured ligament or tendon injury.   Torn cartilage.   Medical conditions, such as:   Gout   Arthritis   Infections   Overuse, over training or overdoing a physical activity.  Knee pain can be minor or severe. Knee pain can accompany debilitating injury. Minor knee problems often respond well to self-care measures or get well on their own. More serious injuries may need medical intervention or even surgery. SYMPTOMS The knee is complex. Symptoms of knee problems can vary widely. Some of the problems are:  Pain with movement and weight bearing.   Swelling and tenderness.   Buckling of the knee.   Inability to straighten or extend your knee.   Your knee locks and you cannot straighten it.   Warmth and redness with pain and fever.   Deformity or dislocation of the kneecap.  DIAGNOSIS  Determining what is wrong may be very straight forward such as when there is an injury. It can also be challenging because of the complexity of the knee. Tests to make a diagnosis may include:  Your caregiver taking a history and doing a physical exam.   Routine X-rays can be used to rule out other problems. X-rays will not reveal a cartilage tear. Some injuries of the knee can be diagnosed by:   Arthroscopy a surgical technique by which a small video camera is inserted through tiny incisions on the sides of the knee. This procedure is used to examine and repair internal knee joint problems. Tiny instruments can be used during arthroscopy to repair the torn knee cartilage  (meniscus).   Arthrography is a radiology technique. A contrast liquid is directly injected into the knee joint. Internal structures of the knee joint then become visible on X-ray film.   An MRI scan is a non x-ray radiology procedure in which magnetic fields and a computer produce two- or three-dimensional images of the inside of the knee. Cartilage tears are often visible using an MRI scanner. MRI scans have largely replaced arthrography in diagnosing cartilage tears of the knee.   Blood work.   Examination of the fluid that helps to lubricate the knee joint (synovial fluid). This is done by taking a sample out using a needle and a syringe.  TREATMENT The treatment of knee problems depends on the cause. Some of these treatments are:  Depending on the injury, proper casting, splinting, surgery or physical therapy care will be needed.   Give yourself adequate recovery time. Do not overuse your joints. If you begin to get sore during workout routines, back off. Slow down or do fewer repetitions.   For repetitive activities such as cycling or running, maintain your strength and nutrition.   Alternate muscle groups. For example if you are a weight lifter, work the upper body on one day and the lower body the next.   Either tight or weak muscles do not give the proper support for your knee. Tight or weak muscles do not absorb the stress placed   on the knee joint. Keep the muscles surrounding the knee strong.   Take care of mechanical problems.   If you have flat feet, orthotics or special shoes may help. See your caregiver if you need help.   Arch supports, sometimes with wedges on the inner or outer aspect of the heel, can help. These can shift pressure away from the side of the knee most bothered by osteoarthritis.   A brace called an "unloader" brace also may be used to help ease the pressure on the most arthritic side of the knee.   If your caregiver has prescribed crutches, braces,  wraps or ice, use as directed. The acronym for this is PRICE. This means protection, rest, ice, compression and elevation.   Nonsteroidal anti-inflammatory drugs (NSAID's), can help relieve pain. But if taken immediately after an injury, they may actually increase swelling. Take NSAID's with food in your stomach. Stop them if you develop stomach problems. Do not take these if you have a history of ulcers, stomach pain or bleeding from the bowel. Do not take without your caregiver's approval if you have problems with fluid retention, heart failure, or kidney problems.   For ongoing knee problems, physical therapy may be helpful.   Glucosamine and chondroitin are over-the-counter dietary supplements. Both may help relieve the pain of osteoarthritis in the knee. These medicines are different from the usual anti-inflammatory drugs. Glucosamine may decrease the rate of cartilage destruction.   Injections of a corticosteroid drug into your knee joint may help reduce the symptoms of an arthritis flare-up. They may provide pain relief that lasts a few months. You may have to wait a few months between injections. The injections do have a small increased risk of infection, water retention and elevated blood sugar levels.   Hyaluronic acid injected into damaged joints may ease pain and provide lubrication. These injections may work by reducing inflammation. A series of shots may give relief for as long as 6 months.   Topical painkillers. Applying certain ointments to your skin may help relieve the pain and stiffness of osteoarthritis. Ask your pharmacist for suggestions. Many over the-counter products are approved for temporary relief of arthritis pain.   In some countries, doctors often prescribe topical NSAID's for relief of chronic conditions such as arthritis and tendinitis. A review of treatment with NSAID creams found that they worked as well as oral medications but without the serious side effects.    PREVENTION  Maintain a healthy weight. Extra pounds put more strain on your joints.   Get strong, stay limber. Weak muscles are a common cause of knee injuries. Stretching is important. Include flexibility exercises in your workouts.   Be smart about exercise. If you have osteoarthritis, chronic knee pain or recurring injuries, you may need to change the way you exercise. This does not mean you have to stop being active. If your knees ache after jogging or playing basketball, consider switching to swimming, water aerobics or other low-impact activities, at least for a few days a week. Sometimes limiting high-impact activities will provide relief.   Make sure your shoes fit well. Choose footwear that is right for your sport.   Protect your knees. Use the proper gear for knee-sensitive activities. Use kneepads when playing volleyball or laying carpet. Buckle your seat belt every time you drive. Most shattered kneecaps occur in car accidents.   Rest when you are tired.  SEEK MEDICAL CARE IF:  You have knee pain that is continual and does not   seem to be getting better.  SEEK IMMEDIATE MEDICAL CARE IF:  Your knee joint feels hot to the touch and you have a high fever. MAKE SURE YOU:   Understand these instructions.   Will watch your condition.   Will get help right away if you are not doing well or get worse.  Document Released: 06/06/2007 Document Revised: 07/29/2011 Document Reviewed: 06/06/2007 [employer] Patient Information 2012 [employer]. 
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
## 2012-02-05 IMAGING — CT CT CTA ABD/PEL W/CM AND/OR W/O CM
3 of 9 series · 11 of 46 positions shown, 17 images · IV contrast (APPLIED)
Comparison: Chest study from earlier the same day

CLINICAL DATA: Left upper abdominal pain.  Possible splenic
infarct on CT chest.

CT ANGIOGRAPHY ABDOMEN AND PELVIS
TECHNIQUE: Multidetector CT imaging of the abdomen and pelvis
using the standard protocol during bolus administration of
intravenous contrast. Multiplanar reconstructed images obtained and
reviewed to evaluate the vascular anatomy.
Contrast: 100 ml on the 350 IV

[Series 5: abd cta 2.0 (id) · axial · 0.72mm/px · z∈[-438,-396]mm · 2 of 206 slices shown]
[im 21/206  soft-tissue]
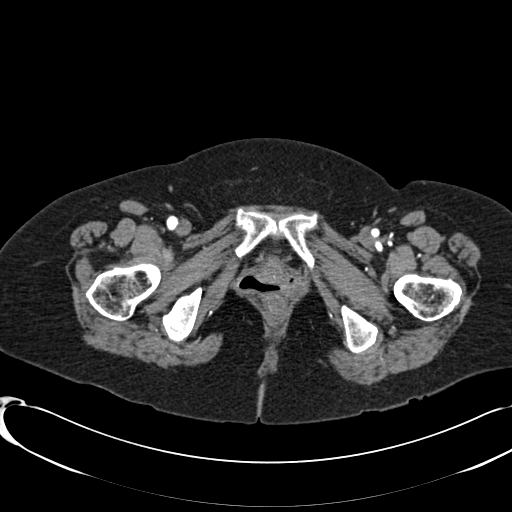
[im 42/206  soft-tissue]
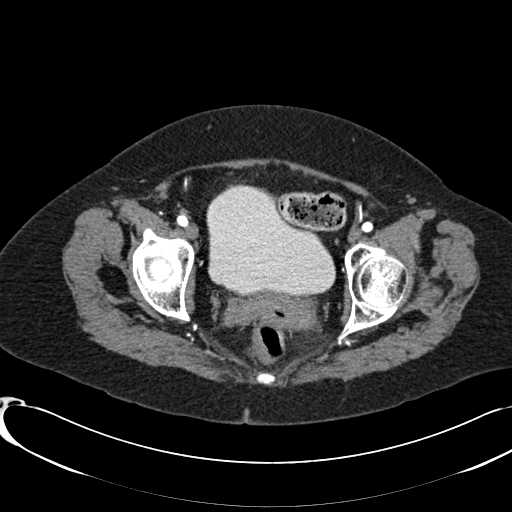

[Series 7: abd with 5.0 b30f st · axial · 0.72mm/px · z∈[-458,-128]mm · 7 of 90 slices shown, 12 images]
[im 12/90  soft-tissue]
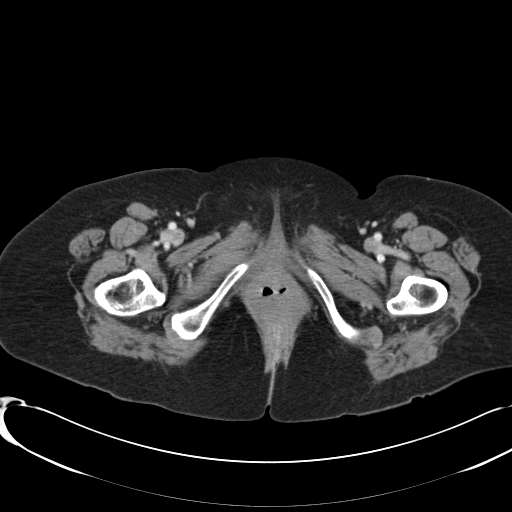
[im 12/90  bone]
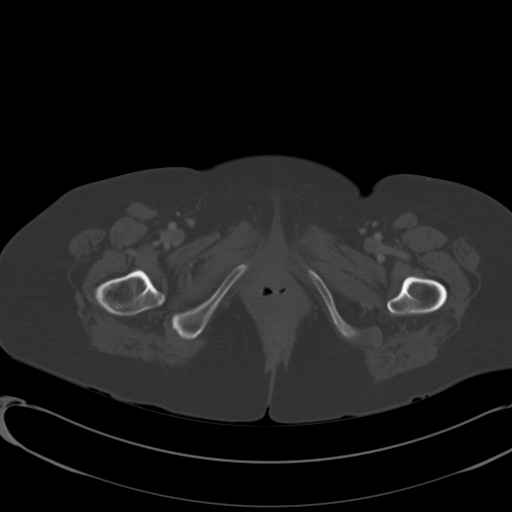
[im 23/90  soft-tissue]
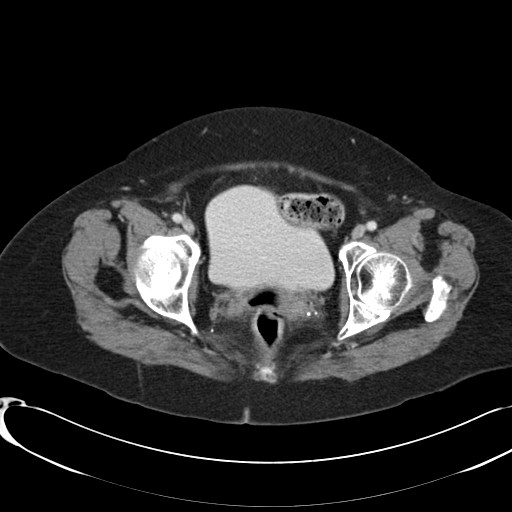
[im 34/90  soft-tissue]
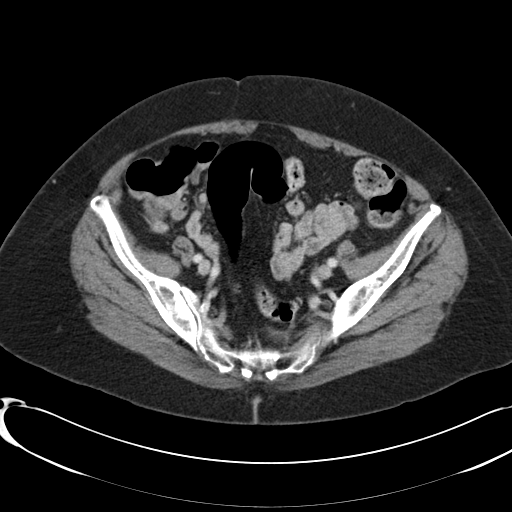
[im 45/90  soft-tissue]
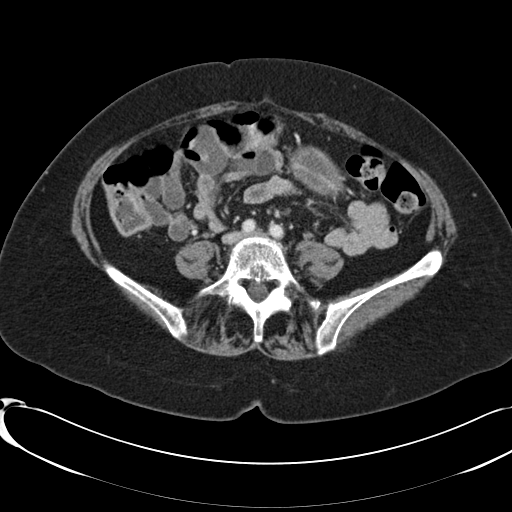
[im 45/90  lung]
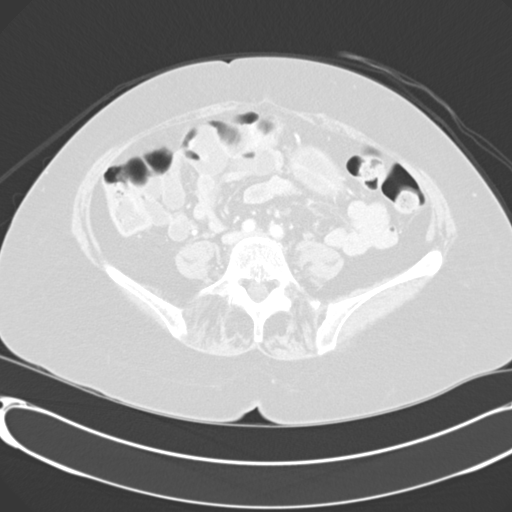
[im 56/90  soft-tissue]
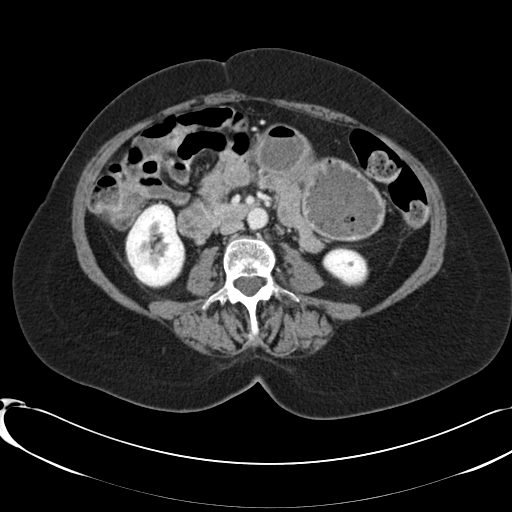
[im 56/90  lung]
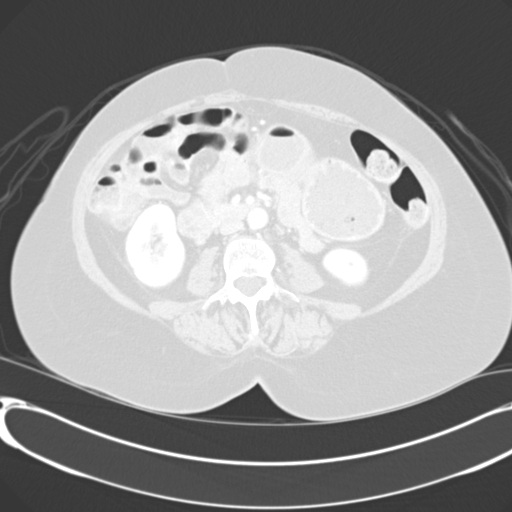
[im 67/90  soft-tissue]
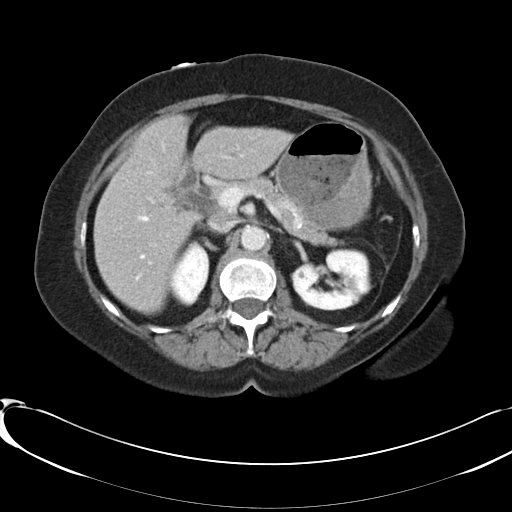
[im 67/90  lung]
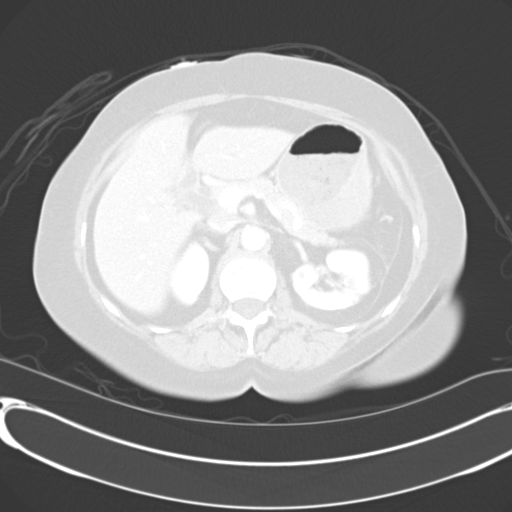
[im 78/90  soft-tissue]
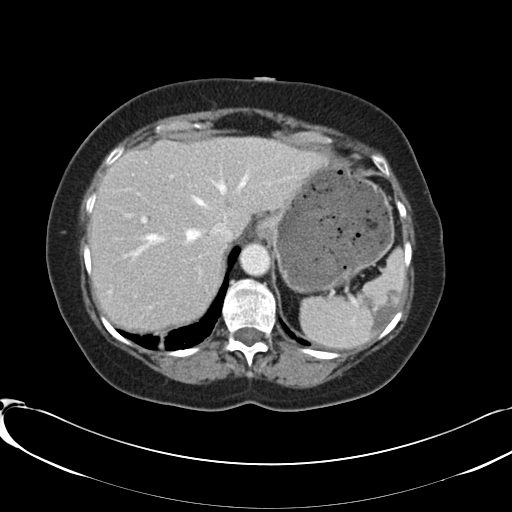
[im 78/90  lung]
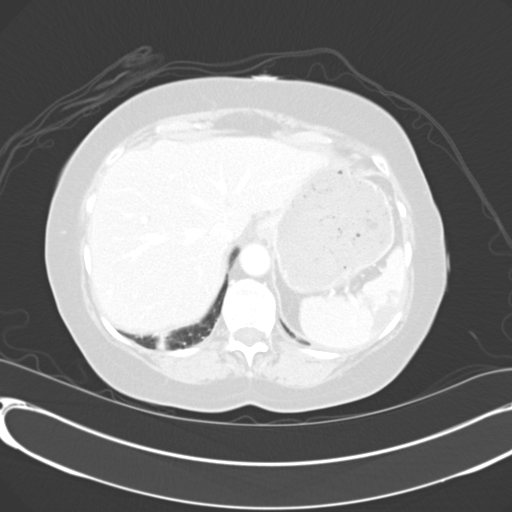

[Series 602: arterial coronal · coronal · arterial · 0.80mm/px · 2 of 108 slices shown, 3 images]
[im 36/108  soft-tissue]
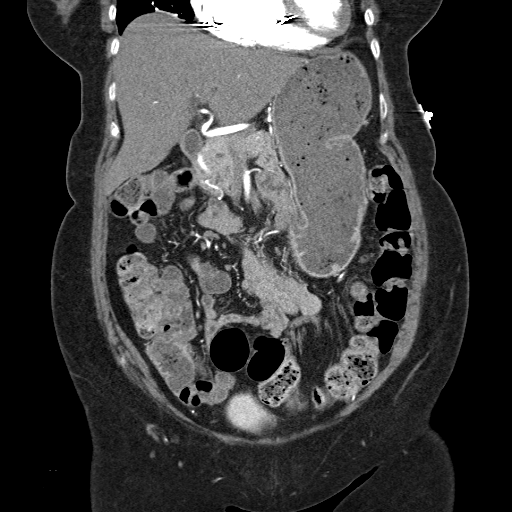
[im 36/108  bone]
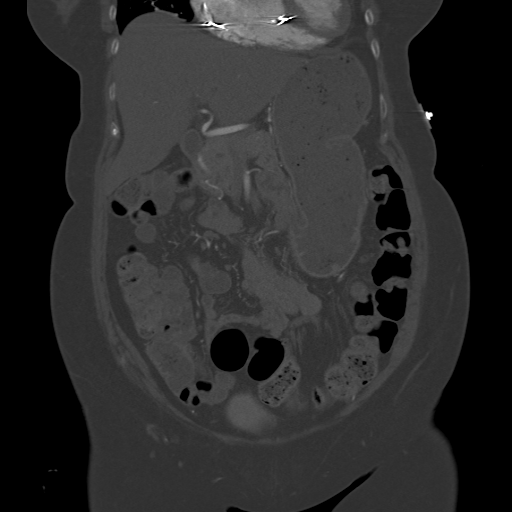
[im 72/108  soft-tissue]
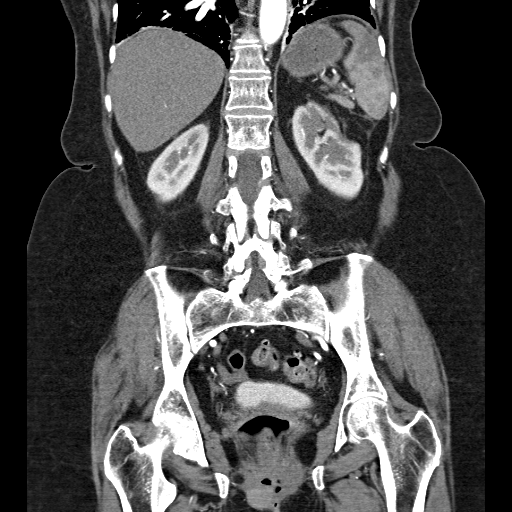

[11 of 46 positions shown; findings below may reference images not displayed]

FINDINGS: Abdominal aorta:  Normal in caliber and contour without significant
atheromatous irregularity, dissection, aneurysm, or stenosis

Celiac axis:  Partially calcified nonocclusive ostial plaque, less
than 50% diameter stenosis.  Widely patent distally. The splenic
artery is widely patent.

Superior mesenteric artery:  Partially calcified ostial
nonocclusive plaque, widely patent distally.  Replaced right
hepatic arterial supply, an anatomic variant.

Left renal artery:  Eccentric calcified nonocclusive ostial plaque,
widely patent distally

Right renal artery:  Eccentric calcified nonocclusive ostial
plaque, widely patent distally

Inferior mesenteric artery:  Patent

Bifurcation:  Unremarkable

Iliacs:  Minimal calcific plaque at the bifurcation of the left
common iliac artery.  Otherwise widely patent without dissection,
aneurysm, or stenosis.  There is minimal plaque in the right common
femoral artery without stenosis.

Venous phase shows patency of the hepatic veins, portal vein,
superior mesenteric vein, splenic vein, bilateral renal veins, and
IVC.  There is incomplete opacification of the pelvic veins.

Coarse asymmetric airspace disease in the visualized lung bases,
left greater than right as before.  No effusion.  Gallbladder is
surgically absent.  There is mild prominence of the common bile
duct, visualized down to the ampulla.  There is a small duodenal
diverticulum.  Unremarkable hepatic parenchyma, adrenal glands.

There is a wedgelike area of decreased enhancement in the spleen as
previously noted. Interparenchymal vessels are evident through this
region on the arterial phase imaging.  There is no adjacent
inflammatory/edematous change.  No associated contour deformity.
The enhancement defect persists on the delayed scans.

There is a focal area of parenchymal loss in the upper pole left
kidney and a smaller hypodense area in the interpolar region of the
right kidney.

Stomach is distended by ingested material.  Small bowel is
nondilated.  Normal appendix.  Colon is nondilated, unremarkable.
Urinary bladder physiologically distended.  Scattered uterine
calcifications may represent degenerated uterine fibroids.  No
ascites.  No free air.  Subcutaneous gas bubbles in the left lower
quadrant anterior body wall probably from injections.  Early
degenerative disc disease L5-S1.
IMPRESSION: 1. Minimal calcified nonocclusive plaque at the origins of the
celiac axis, superior mesenteric artery, and bilateral renal
arteries. This is inadequate to represent an etiology of occlusive
mesenteric ischemia.
2.  Splenic infarct of uncertain chronicity.  Of note,
intraparenchymal arterial branches are noted to course through this
region.

## 2012-02-09 ENCOUNTER — Ambulatory Visit (INDEPENDENT_AMBULATORY_CARE_PROVIDER_SITE_OTHER): Payer: Medicare HMO | Admitting: *Deleted

## 2012-02-09 DIAGNOSIS — I4891 Unspecified atrial fibrillation: Secondary | ICD-10-CM

## 2012-02-09 DIAGNOSIS — D735 Infarction of spleen: Secondary | ICD-10-CM

## 2012-02-09 DIAGNOSIS — D7389 Other diseases of spleen: Secondary | ICD-10-CM

## 2012-02-09 DIAGNOSIS — Z7901 Long term (current) use of anticoagulants: Secondary | ICD-10-CM

## 2012-02-09 DIAGNOSIS — I480 Paroxysmal atrial fibrillation: Secondary | ICD-10-CM

## 2012-02-09 LAB — POCT INR: INR: 2.6

## 2012-02-17 ENCOUNTER — Encounter: Payer: Medicare HMO | Admitting: *Deleted

## 2012-02-20 ENCOUNTER — Other Ambulatory Visit: Payer: Self-pay | Admitting: Family Medicine

## 2012-02-21 ENCOUNTER — Ambulatory Visit (INDEPENDENT_AMBULATORY_CARE_PROVIDER_SITE_OTHER): Payer: Medicare HMO | Admitting: *Deleted

## 2012-02-21 ENCOUNTER — Encounter: Payer: Self-pay | Admitting: Internal Medicine

## 2012-02-21 ENCOUNTER — Encounter: Payer: Self-pay | Admitting: *Deleted

## 2012-02-21 DIAGNOSIS — I480 Paroxysmal atrial fibrillation: Secondary | ICD-10-CM

## 2012-02-21 DIAGNOSIS — I4891 Unspecified atrial fibrillation: Secondary | ICD-10-CM

## 2012-02-21 DIAGNOSIS — Z950 Presence of cardiac pacemaker: Secondary | ICD-10-CM

## 2012-02-25 ENCOUNTER — Telehealth: Payer: Self-pay | Admitting: Internal Medicine

## 2012-02-25 LAB — REMOTE PACEMAKER DEVICE
BATTERY VOLTAGE: 2.77 V
BMOD-0003RV: 30
BRDY-0002RV: 60 {beats}/min
BRDY-0004RV: 150 {beats}/min
RV LEAD IMPEDENCE PM: 464 Ohm

## 2012-02-25 NOTE — Telephone Encounter (Signed)
New msg Pt wants to know if you got last transmisson

## 2012-02-28 NOTE — Telephone Encounter (Signed)
N/A will return call later/kwm

## 2012-03-06 ENCOUNTER — Telehealth: Payer: Self-pay | Admitting: Emergency Medicine

## 2012-03-06 MED ORDER — BECLOMETHASONE DIPROPIONATE 80 MCG/ACT IN AERS: 2.0000 | INHALATION_SPRAY | Freq: Two times a day (BID) | RESPIRATORY_TRACT | Status: DC

## 2012-03-06 NOTE — Telephone Encounter (Signed)
Left message on patients machine that Rx has been sent to pharmacy. If any questions or concerns please call our office.

## 2012-03-08 NOTE — Telephone Encounter (Signed)
Spoke w/pt in regards to transmission. Transmission was received on 02-21-12. Pt aware that letter will be sent with next transmission date or appt in office.

## 2012-03-16 ENCOUNTER — Other Ambulatory Visit: Payer: Self-pay | Admitting: Emergency Medicine

## 2012-03-17 ENCOUNTER — Encounter: Payer: Self-pay | Admitting: *Deleted

## 2012-03-22 ENCOUNTER — Ambulatory Visit (INDEPENDENT_AMBULATORY_CARE_PROVIDER_SITE_OTHER): Payer: Medicare HMO | Admitting: *Deleted

## 2012-03-22 DIAGNOSIS — D735 Infarction of spleen: Secondary | ICD-10-CM

## 2012-03-22 DIAGNOSIS — I4891 Unspecified atrial fibrillation: Secondary | ICD-10-CM

## 2012-03-22 DIAGNOSIS — D7389 Other diseases of spleen: Secondary | ICD-10-CM

## 2012-03-22 DIAGNOSIS — I480 Paroxysmal atrial fibrillation: Secondary | ICD-10-CM

## 2012-03-22 DIAGNOSIS — Z7901 Long term (current) use of anticoagulants: Secondary | ICD-10-CM

## 2012-03-22 LAB — POCT INR: INR: 2.7

## 2012-03-31 ENCOUNTER — Encounter: Payer: Self-pay | Admitting: Emergency Medicine

## 2012-03-31 ENCOUNTER — Ambulatory Visit (INDEPENDENT_AMBULATORY_CARE_PROVIDER_SITE_OTHER): Payer: Medicare HMO | Admitting: Emergency Medicine

## 2012-03-31 VITALS — BP 112/68 | HR 82 | Temp 98.1°F | Ht 59.0 in | Wt 143.2 lb

## 2012-03-31 DIAGNOSIS — J45909 Unspecified asthma, uncomplicated: Secondary | ICD-10-CM

## 2012-03-31 DIAGNOSIS — J310 Chronic rhinitis: Secondary | ICD-10-CM

## 2012-03-31 MED ORDER — BUDESONIDE 32 MCG/ACT NA SUSP: 2.0000 | Freq: Every day | NASAL | Status: DC

## 2012-03-31 MED ORDER — TIOTROPIUM BROMIDE MONOHYDRATE 18 MCG IN CAPS: 18.0000 ug | ORAL_CAPSULE | Freq: Every day | RESPIRATORY_TRACT | Status: DC

## 2012-03-31 MED ORDER — BECLOMETHASONE DIPROPIONATE 80 MCG/ACT IN AERS: 2.0000 | INHALATION_SPRAY | Freq: Two times a day (BID) | RESPIRATORY_TRACT | Status: DC

## 2012-03-31 NOTE — Patient Instructions (Addendum)
Please continue your current medications as you are taking them  Follow with Dr Brigitte Soderberg in 6 months or sooner if you have any problems  

## 2012-03-31 NOTE — Progress Notes (Signed)
[name] is a 77 year old woman with a history of mild bronchiectasis and associated mild airflow limitation. She also has allergic rhinitis and postnasal drip. Taking fexofenadine and Rhinocort. Also on Spiriva + QVAR.   ROV 01/28/10 -- returns for f/u of her allergies, bronchiectasis, AFL. Has been seen by Dr Maple Hudson for allergy testing, skin testing negative. Nasal gtt is bothersome when she is outside and at night, white mucous. She is planning to have repeat skin testing Sept 1. Remains on QVAR and Spiriva. Also on fexafenadine + Rhinocort.   ROV 07/29/10 -- f/u for bronchiectasis, mild AFL, chronic rhinitis. She has been seen by Dr Maple Hudson, has negative skin testing since last visit. Has been on loratadine, allegra/fexofenadine, clarinex in the past. She is currently on name brand Allegra, causes her mouth and nose to be more dry, more nose bleeding. Cough stable. She feels more tired on the Allegra. Still on QVAR + Spiriva - although not clear that she has significant AFL, she believes she benefits.   ROV 04/01/11 -- bronchiectasis, mild AFL, chronic rhinitis with negative allergy w/u. She is on QVAR + Spiriva - although not clear that she has significant AFL, she believes she benefits. She was just at the beach - made her nasal gtt much worse. She remains on Rhinocort, but is unable to take allegra, loratadine, etc. She states that her DOE is slightly worse.   ROV 10/07/11 --  bronchiectasis, mild AFL, chronic rhinitis with negative allergy w/u. She is on QVAR + Spiriva.  She indicates that for the last few weeks she has had more allergic sx, more drainage, itchy eyes, etc. Bothers her at night when lying flat. She is on Rhinocort, went back on allegra last time. Dyspnea is stable.    ROV 03/31/12 -- bronchiectasis, mild AFL, chronic rhinitis with negative allergy w/u. She is on QVAR + Spiriva.  She continues to have cough, seems to hit her when supine. Tells me her SOB is stable. Remains on loratadine, uses  rhinocort but ran out a few days ago, needs refills. She is also Spiriva + QVAR. Uses [facility] qod, PPI daily.    Filed Vitals:   03/31/12 1429  BP: 112/68  Pulse: 82  Temp: 98.1 F (36.7 C)   Gen: Pleasant, well-nourished, in no distress,  normal affect  ENT: No lesions,  mouth clear,  oropharynx clear, no postnasal drip, hoarse voice  Neck: No JVD, no TMG, no carotid bruits  Lungs: No use of accessory muscles, few insp crackles L base  Cardiovascular: RRR, heart sounds normal, no murmur or gallops, no peripheral edema  Musculoskeletal: No deformities, no cyanosis or clubbing  Neuro: alert, non focal  Skin: Warm, no lesions or rashes   RHINITIS Refill rhinocort Continue nsw Continue loratadine  ASTHMA Asthma syndrome in setting bronchiectasis and allergic rhinitis

## 2012-03-31 NOTE — Assessment & Plan Note (Signed)
Refill rhinocort Continue nsw Continue loratadine

## 2012-03-31 NOTE — Assessment & Plan Note (Signed)
Asthma syndrome in setting bronchiectasis and allergic rhinitis

## 2012-04-07 ENCOUNTER — Ambulatory Visit: Payer: Medicare HMO | Admitting: Emergency Medicine

## 2012-04-14 ENCOUNTER — Telehealth: Payer: Self-pay | Admitting: Family Medicine

## 2012-04-14 DIAGNOSIS — G4700 Insomnia, unspecified: Secondary | ICD-10-CM

## 2012-04-14 MED ORDER — TRAZODONE HCL 100 MG PO TABS: ORAL_TABLET | ORAL | Status: DC

## 2012-04-14 NOTE — Telephone Encounter (Signed)
Refill: trazadone 100mg  tablet. Take 2 tablets at bedtime. Qty 60. Last fill 03-17-12

## 2012-04-14 NOTE — Telephone Encounter (Signed)
refill 

## 2012-04-14 NOTE — Telephone Encounter (Signed)
Last seen 02/03/12 and filled 01/18/12 # 60 with 2 refills. Please advise    KP

## 2012-05-03 ENCOUNTER — Other Ambulatory Visit: Payer: Self-pay | Admitting: Internal Medicine

## 2012-05-03 ENCOUNTER — Ambulatory Visit (INDEPENDENT_AMBULATORY_CARE_PROVIDER_SITE_OTHER): Payer: Medicare HMO | Admitting: *Deleted

## 2012-05-03 DIAGNOSIS — D7389 Other diseases of spleen: Secondary | ICD-10-CM

## 2012-05-03 DIAGNOSIS — I4891 Unspecified atrial fibrillation: Secondary | ICD-10-CM

## 2012-05-03 DIAGNOSIS — D735 Infarction of spleen: Secondary | ICD-10-CM

## 2012-05-03 DIAGNOSIS — Z7901 Long term (current) use of anticoagulants: Secondary | ICD-10-CM

## 2012-05-03 DIAGNOSIS — I480 Paroxysmal atrial fibrillation: Secondary | ICD-10-CM

## 2012-05-04 ENCOUNTER — Other Ambulatory Visit: Payer: Self-pay | Admitting: *Deleted

## 2012-05-09 ENCOUNTER — Other Ambulatory Visit: Payer: Self-pay | Admitting: Family Medicine

## 2012-05-10 NOTE — Telephone Encounter (Signed)
Needs fasting labs prior to more refills.

## 2012-05-17 ENCOUNTER — Other Ambulatory Visit: Payer: Self-pay | Admitting: Family Medicine

## 2012-05-17 NOTE — Telephone Encounter (Signed)
Last seen 02/03/12 and filled 04/14/12 # 60. Please advise     KP

## 2012-05-23 ENCOUNTER — Other Ambulatory Visit: Payer: Self-pay | Admitting: Family Medicine

## 2012-05-23 NOTE — Telephone Encounter (Signed)
Need okay.   MW  

## 2012-05-25 ENCOUNTER — Other Ambulatory Visit: Payer: Self-pay | Admitting: Family Medicine

## 2012-05-29 ENCOUNTER — Ambulatory Visit (INDEPENDENT_AMBULATORY_CARE_PROVIDER_SITE_OTHER): Payer: Medicare HMO | Admitting: *Deleted

## 2012-05-29 ENCOUNTER — Encounter: Payer: Self-pay | Admitting: Internal Medicine

## 2012-05-29 DIAGNOSIS — I495 Sick sinus syndrome: Secondary | ICD-10-CM

## 2012-05-29 DIAGNOSIS — Z950 Presence of cardiac pacemaker: Secondary | ICD-10-CM

## 2012-06-01 LAB — REMOTE PACEMAKER DEVICE
BATTERY VOLTAGE: 2.77 V
BMOD-0005RV: 95 {beats}/min
RV LEAD THRESHOLD: 1 V
VENTRICULAR PACING PM: 95

## 2012-06-06 ENCOUNTER — Encounter: Payer: Self-pay | Admitting: *Deleted

## 2012-06-12 ENCOUNTER — Other Ambulatory Visit: Payer: Self-pay | Admitting: Family Medicine

## 2012-06-14 ENCOUNTER — Ambulatory Visit (INDEPENDENT_AMBULATORY_CARE_PROVIDER_SITE_OTHER): Payer: Medicare HMO | Admitting: *Deleted

## 2012-06-14 DIAGNOSIS — Z7901 Long term (current) use of anticoagulants: Secondary | ICD-10-CM

## 2012-06-14 DIAGNOSIS — I4891 Unspecified atrial fibrillation: Secondary | ICD-10-CM

## 2012-06-14 DIAGNOSIS — D7389 Other diseases of spleen: Secondary | ICD-10-CM

## 2012-06-14 DIAGNOSIS — D735 Infarction of spleen: Secondary | ICD-10-CM

## 2012-06-14 DIAGNOSIS — I480 Paroxysmal atrial fibrillation: Secondary | ICD-10-CM

## 2012-06-14 LAB — POCT INR: INR: 2.3

## 2012-06-19 ENCOUNTER — Telehealth: Payer: Self-pay | Admitting: Family Medicine

## 2012-06-19 MED ORDER — TRAZODONE HCL 100 MG PO TABS: 200.0000 mg | ORAL_TABLET | Freq: Every day | ORAL | Status: DC

## 2012-06-19 NOTE — Telephone Encounter (Signed)
Refill: trazadone 100 mg tablet. Take 2 tablets by mouth at bedtime. Qty 60. Last fill 05-17-12

## 2012-06-19 NOTE — Telephone Encounter (Signed)
Refill x1 

## 2012-06-19 NOTE — Telephone Encounter (Signed)
Last seen 02/03/12 ad filled 05/17/12 # 60. Please advise     KP

## 2012-07-05 ENCOUNTER — Other Ambulatory Visit: Payer: Self-pay | Admitting: Family Medicine

## 2012-07-05 DIAGNOSIS — Z1231 Encounter for screening mammogram for malignant neoplasm of breast: Secondary | ICD-10-CM

## 2012-07-06 ENCOUNTER — Ambulatory Visit (INDEPENDENT_AMBULATORY_CARE_PROVIDER_SITE_OTHER): Payer: Medicare HMO | Admitting: Family Medicine

## 2012-07-06 ENCOUNTER — Encounter: Payer: Self-pay | Admitting: Family Medicine

## 2012-07-06 VITALS — BP 112/68 | HR 94 | Temp 98.0°F | Ht 59.25 in | Wt 144.8 lb

## 2012-07-06 DIAGNOSIS — Z0000 Encounter for general adult medical examination without abnormal findings: Secondary | ICD-10-CM

## 2012-07-06 DIAGNOSIS — N390 Urinary tract infection, site not specified: Secondary | ICD-10-CM

## 2012-07-06 DIAGNOSIS — E785 Hyperlipidemia, unspecified: Secondary | ICD-10-CM

## 2012-07-06 DIAGNOSIS — J45909 Unspecified asthma, uncomplicated: Secondary | ICD-10-CM

## 2012-07-06 DIAGNOSIS — Z23 Encounter for immunization: Secondary | ICD-10-CM

## 2012-07-06 LAB — HEPATIC FUNCTION PANEL
AST: 26 U/L (ref 0–37)
Albumin: 4.4 g/dL (ref 3.5–5.2)
Alkaline Phosphatase: 90 U/L (ref 39–117)
Total Protein: 7.9 g/dL (ref 6.0–8.3)

## 2012-07-06 LAB — CBC WITH DIFFERENTIAL/PLATELET
Basophils Relative: 0.4 % (ref 0.0–3.0)
Eosinophils Absolute: 0.1 10*3/uL (ref 0.0–0.7)
HCT: 41.2 % (ref 36.0–46.0)
Hemoglobin: 13.6 g/dL (ref 12.0–15.0)
Lymphocytes Relative: 17.4 % (ref 12.0–46.0)
Lymphs Abs: 1.9 10*3/uL (ref 0.7–4.0)
MCHC: 33.1 g/dL (ref 30.0–36.0)
Monocytes Relative: 9.1 % (ref 3.0–12.0)
Neutro Abs: 8 10*3/uL — ABNORMAL HIGH (ref 1.4–7.7)
RBC: 4.69 Mil/uL (ref 3.87–5.11)

## 2012-07-06 LAB — BASIC METABOLIC PANEL
CO2: 23 mEq/L (ref 19–32)
Calcium: 9.2 mg/dL (ref 8.4–10.5)
Glucose, Bld: 105 mg/dL — ABNORMAL HIGH (ref 70–99)
Potassium: 4.1 mEq/L (ref 3.5–5.1)
Sodium: 130 mEq/L — ABNORMAL LOW (ref 135–145)

## 2012-07-06 LAB — LIPID PANEL: Cholesterol: 121 mg/dL (ref 0–200)

## 2012-07-06 LAB — POCT URINALYSIS DIPSTICK
Bilirubin, UA: NEGATIVE
Ketones, UA: NEGATIVE

## 2012-07-06 MED ORDER — ALBUTEROL SULFATE (5 MG/ML) 0.5% IN NEBU
2.5000 mg | INHALATION_SOLUTION | Freq: Once | RESPIRATORY_TRACT | Status: AC
  Administered 2012-07-06: 2.5 mg via RESPIRATORY_TRACT

## 2012-07-06 MED ORDER — IPRATROPIUM BROMIDE 0.02 % IN SOLN
0.5000 mg | Freq: Once | RESPIRATORY_TRACT | Status: AC
  Administered 2012-07-06: 0.5 mg via RESPIRATORY_TRACT

## 2012-07-06 MED ORDER — ATORVASTATIN CALCIUM 10 MG PO TABS: ORAL_TABLET | ORAL | Status: DC

## 2012-07-06 NOTE — Patient Instructions (Addendum)
Preventive Care for Adults, Female A healthy lifestyle and preventive care can promote health and wellness. Preventive health guidelines for women include the following key practices.  A routine yearly physical is a good way to check with your caregiver about your health and preventive screening. It is a chance to share any concerns and updates on your health, and to receive a thorough exam.  Visit your dentist for a routine exam and preventive care every 6 months. Brush your teeth twice a day and floss once a day. Good oral hygiene prevents tooth decay and gum disease.  The frequency of eye exams is based on your age, health, family medical history, use of contact lenses, and other factors. Follow your caregiver's recommendations for frequency of eye exams.  Eat a healthy diet. Foods like vegetables, fruits, whole grains, low-fat dairy products, and lean protein foods contain the nutrients you need without too many calories. Decrease your intake of foods high in solid fats, added sugars, and salt. Eat the right amount of calories for you.Get information about a proper diet from your caregiver, if necessary.  Regular physical exercise is one of the most important things you can do for your health. Most adults should get at least 150 minutes of moderate-intensity exercise (any activity that increases your heart rate and causes you to sweat) each week. In addition, most adults need muscle-strengthening exercises on 2 or more days a week.  Maintain a healthy weight. The body mass index (BMI) is a screening tool to identify possible weight problems. It provides an estimate of body fat based on height and weight. Your caregiver can help determine your BMI, and can help you achieve or maintain a healthy weight.For adults 20 years and older:  A BMI below 18.5 is considered underweight.  A BMI of 18.5 to 24.9 is normal.  A BMI of 25 to 29.9 is considered overweight.  A BMI of 30 and above is  considered obese.  Maintain normal blood lipids and cholesterol levels by exercising and minimizing your intake of saturated fat. Eat a balanced diet with plenty of fruit and vegetables. Blood tests for lipids and cholesterol should begin at age 20 and be repeated every 5 years. If your lipid or cholesterol levels are high, you are over 50, or you are at high risk for heart disease, you may need your cholesterol levels checked more frequently.Ongoing high lipid and cholesterol levels should be treated with medicines if diet and exercise are not effective.  If you smoke, find out from your caregiver how to quit. If you do not use tobacco, do not start.  If you are pregnant, do not drink alcohol. If you are breastfeeding, be very cautious about drinking alcohol. If you are not pregnant and choose to drink alcohol, do not exceed 1 drink per day. One drink is considered to be 12 ounces (355 mL) of beer, 5 ounces (148 mL) of wine, or 1.5 ounces (44 mL) of liquor.  Avoid use of street drugs. Do not share needles with anyone. Ask for help if you need support or instructions about stopping the use of drugs.  High blood pressure causes heart disease and increases the risk of stroke. Your blood pressure should be checked at least every 1 to 2 years. Ongoing high blood pressure should be treated with medicines if weight loss and exercise are not effective.  If you are 55 to 75 years old, ask your caregiver if you should take aspirin to prevent strokes.  Diabetes   screening involves taking a blood sample to check your fasting blood sugar level. This should be done once every 3 years, after age 45, if you are within normal weight and without risk factors for diabetes. Testing should be considered at a younger age or be carried out more frequently if you are overweight and have at least 1 risk factor for diabetes.  Breast cancer screening is essential preventive care for women. You should practice "breast  self-awareness." This means understanding the normal appearance and feel of your breasts and may include breast self-examination. Any changes detected, no matter how small, should be reported to a caregiver. Women in their 20s and 30s should have a clinical breast exam (CBE) by a caregiver as part of a regular health exam every 1 to 3 years. After age 40, women should have a CBE every year. Starting at age 40, women should consider having a mammography (breast X-ray test) every year. Women who have a family history of breast cancer should talk to their caregiver about genetic screening. Women at a high risk of breast cancer should talk to their caregivers about having magnetic resonance imaging (MRI) and a mammography every year.  The Pap test is a screening test for cervical cancer. A Pap test can show cell changes on the cervix that might become cervical cancer if left untreated. A Pap test is a procedure in which cells are obtained and examined from the lower end of the uterus (cervix).  Women should have a Pap test starting at age 21.  Between ages 21 and 29, Pap tests should be repeated every 2 years.  Beginning at age 30, you should have a Pap test every 3 years as long as the past 3 Pap tests have been normal.  Some women have medical problems that increase the chance of getting cervical cancer. Talk to your caregiver about these problems. It is especially important to talk to your caregiver if a new problem develops soon after your last Pap test. In these cases, your caregiver may recommend more frequent screening and Pap tests.  The above recommendations are the same for women who have or have not gotten the vaccine for human papillomavirus (HPV).  If you had a hysterectomy for a problem that was not cancer or a condition that could lead to cancer, then you no longer need Pap tests. Even if you no longer need a Pap test, a regular exam is a good idea to make sure no other problems are  starting.  If you are between ages 65 and 70, and you have had normal Pap tests going back 10 years, you no longer need Pap tests. Even if you no longer need a Pap test, a regular exam is a good idea to make sure no other problems are starting.  If you have had past treatment for cervical cancer or a condition that could lead to cancer, you need Pap tests and screening for cancer for at least 20 years after your treatment.  If Pap tests have been discontinued, risk factors (such as a new sexual partner) need to be reassessed to determine if screening should be resumed.  The HPV test is an additional test that may be used for cervical cancer screening. The HPV test looks for the virus that can cause the cell changes on the cervix. The cells collected during the Pap test can be tested for HPV. The HPV test could be used to screen women aged 30 years and older, and should   be used in women of any age who have unclear Pap test results. After the age of 30, women should have HPV testing at the same frequency as a Pap test.  Colorectal cancer can be detected and often prevented. Most routine colorectal cancer screening begins at the age of 50 and continues through age 75. However, your caregiver may recommend screening at an earlier age if you have risk factors for colon cancer. On a yearly basis, your caregiver may provide home test kits to check for hidden blood in the stool. Use of a small camera at the end of a tube, to directly examine the colon (sigmoidoscopy or colonoscopy), can detect the earliest forms of colorectal cancer. Talk to your caregiver about this at age 50, when routine screening begins. Direct examination of the colon should be repeated every 5 to 10 years through age 75, unless early forms of pre-cancerous polyps or small growths are found.  Hepatitis C blood testing is recommended for all people born from 1945 through 1965 and any individual with known risks for hepatitis C.  Practice  safe sex. Use condoms and avoid high-risk sexual practices to reduce the spread of sexually transmitted infections (STIs). STIs include gonorrhea, chlamydia, syphilis, trichomonas, herpes, HPV, and human immunodeficiency virus (HIV). Herpes, HIV, and HPV are viral illnesses that have no cure. They can result in disability, cancer, and death. Sexually active women aged 25 and younger should be checked for chlamydia. Older women with new or multiple partners should also be tested for chlamydia. Testing for other STIs is recommended if you are sexually active and at increased risk.  Osteoporosis is a disease in which the bones lose minerals and strength with aging. This can result in serious bone fractures. The risk of osteoporosis can be identified using a bone density scan. Women ages 65 and over and women at risk for fractures or osteoporosis should discuss screening with their caregivers. Ask your caregiver whether you should take a calcium supplement or vitamin D to reduce the rate of osteoporosis.  Menopause can be associated with physical symptoms and risks. Hormone replacement therapy is available to decrease symptoms and risks. You should talk to your caregiver about whether hormone replacement therapy is right for you.  Use sunscreen with sun protection factor (SPF) of 30 or more. Apply sunscreen liberally and repeatedly throughout the day. You should seek shade when your shadow is shorter than you. Protect yourself by wearing long sleeves, pants, a wide-brimmed hat, and sunglasses year round, whenever you are outdoors.  Once a month, do a whole body skin exam, using a mirror to look at the skin on your back. Notify your caregiver of new moles, moles that have irregular borders, moles that are larger than a pencil eraser, or moles that have changed in shape or color.  Stay current with required immunizations.  Influenza. You need a dose every fall (or winter). The composition of the flu vaccine  changes each year, so being vaccinated once is not enough.  Pneumococcal polysaccharide. You need 1 to 2 doses if you smoke cigarettes or if you have certain chronic medical conditions. You need 1 dose at age 65 (or older) if you have never been vaccinated.  Tetanus, diphtheria, pertussis (Tdap, Td). Get 1 dose of Tdap vaccine if you are younger than age 65, are over 65 and have contact with an infant, are a healthcare worker, are pregnant, or simply want to be protected from whooping cough. After that, you need a Td   booster dose every 10 years. Consult your caregiver if you have not had at least 3 tetanus and diphtheria-containing shots sometime in your life or have a deep or dirty wound.  HPV. You need this vaccine if you are a woman age 26 or younger. The vaccine is given in 3 doses over 6 months.  Measles, mumps, rubella (MMR). You need at least 1 dose of MMR if you were born in 1957 or later. You may also need a second dose.  Meningococcal. If you are age 19 to 21 and a first-year college student living in a residence hall, or have one of several medical conditions, you need to get vaccinated against meningococcal disease. You may also need additional booster doses.  Zoster (shingles). If you are age 60 or older, you should get this vaccine.  Varicella (chickenpox). If you have never had chickenpox or you were vaccinated but received only 1 dose, talk to your caregiver to find out if you need this vaccine.  Hepatitis A. You need this vaccine if you have a specific risk factor for hepatitis A virus infection or you simply wish to be protected from this disease. The vaccine is usually given as 2 doses, 6 to 18 months apart.  Hepatitis B. You need this vaccine if you have a specific risk factor for hepatitis B virus infection or you simply wish to be protected from this disease. The vaccine is given in 3 doses, usually over 6 months. Preventive Services / Frequency Ages 19 to 39  Blood  pressure check.** / Every 1 to 2 years.  Lipid and cholesterol check.** / Every 5 years beginning at age 20.  Clinical breast exam.** / Every 3 years for women in their 20s and 30s.  Pap test.** / Every 2 years from ages 21 through 29. Every 3 years starting at age 30 through age 65 or 70 with a history of 3 consecutive normal Pap tests.  HPV screening.** / Every 3 years from ages 30 through ages 65 to 70 with a history of 3 consecutive normal Pap tests.  Hepatitis C blood test.** / For any individual with known risks for hepatitis C.  Skin self-exam. / Monthly.  Influenza immunization.** / Every year.  Pneumococcal polysaccharide immunization.** / 1 to 2 doses if you smoke cigarettes or if you have certain chronic medical conditions.  Tetanus, diphtheria, pertussis (Tdap, Td) immunization. / A one-time dose of Tdap vaccine. After that, you need a Td booster dose every 10 years.  HPV immunization. / 3 doses over 6 months, if you are 26 and younger.  Measles, mumps, rubella (MMR) immunization. / You need at least 1 dose of MMR if you were born in 1957 or later. You may also need a second dose.  Meningococcal immunization. / 1 dose if you are age 19 to 21 and a first-year college student living in a residence hall, or have one of several medical conditions, you need to get vaccinated against meningococcal disease. You may also need additional booster doses.  Varicella immunization.** / Consult your caregiver.  Hepatitis A immunization.** / Consult your caregiver. 2 doses, 6 to 18 months apart.  Hepatitis B immunization.** / Consult your caregiver. 3 doses usually over 6 months. Ages 40 to 64  Blood pressure check.** / Every 1 to 2 years.  Lipid and cholesterol check.** / Every 5 years beginning at age 20.  Clinical breast exam.** / Every year after age 40.  Mammogram.** / Every year beginning at age 40   and continuing for as long as you are in good health. Consult with your  caregiver.  Pap test.** / Every 3 years starting at age 30 through age 65 or 70 with a history of 3 consecutive normal Pap tests.  HPV screening.** / Every 3 years from ages 30 through ages 65 to 70 with a history of 3 consecutive normal Pap tests.  Fecal occult blood test (FOBT) of stool. / Every year beginning at age 50 and continuing until age 75. You may not need to do this test if you get a colonoscopy every 10 years.  Flexible sigmoidoscopy or colonoscopy.** / Every 5 years for a flexible sigmoidoscopy or every 10 years for a colonoscopy beginning at age 50 and continuing until age 75.  Hepatitis C blood test.** / For all people born from 1945 through 1965 and any individual with known risks for hepatitis C.  Skin self-exam. / Monthly.  Influenza immunization.** / Every year.  Pneumococcal polysaccharide immunization.** / 1 to 2 doses if you smoke cigarettes or if you have certain chronic medical conditions.  Tetanus, diphtheria, pertussis (Tdap, Td) immunization.** / A one-time dose of Tdap vaccine. After that, you need a Td booster dose every 10 years.  Measles, mumps, rubella (MMR) immunization. / You need at least 1 dose of MMR if you were born in 1957 or later. You may also need a second dose.  Varicella immunization.** / Consult your caregiver.  Meningococcal immunization.** / Consult your caregiver.  Hepatitis A immunization.** / Consult your caregiver. 2 doses, 6 to 18 months apart.  Hepatitis B immunization.** / Consult your caregiver. 3 doses, usually over 6 months. Ages 65 and over  Blood pressure check.** / Every 1 to 2 years.  Lipid and cholesterol check.** / Every 5 years beginning at age 20.  Clinical breast exam.** / Every year after age 40.  Mammogram.** / Every year beginning at age 40 and continuing for as long as you are in good health. Consult with your caregiver.  Pap test.** / Every 3 years starting at age 30 through age 65 or 70 with a 3  consecutive normal Pap tests. Testing can be stopped between 65 and 70 with 3 consecutive normal Pap tests and no abnormal Pap or HPV tests in the past 10 years.  HPV screening.** / Every 3 years from ages 30 through ages 65 or 70 with a history of 3 consecutive normal Pap tests. Testing can be stopped between 65 and 70 with 3 consecutive normal Pap tests and no abnormal Pap or HPV tests in the past 10 years.  Fecal occult blood test (FOBT) of stool. / Every year beginning at age 50 and continuing until age 75. You may not need to do this test if you get a colonoscopy every 10 years.  Flexible sigmoidoscopy or colonoscopy.** / Every 5 years for a flexible sigmoidoscopy or every 10 years for a colonoscopy beginning at age 50 and continuing until age 75.  Hepatitis C blood test.** / For all people born from 1945 through 1965 and any individual with known risks for hepatitis C.  Osteoporosis screening.** / A one-time screening for women ages 65 and over and women at risk for fractures or osteoporosis.  Skin self-exam. / Monthly.  Influenza immunization.** / Every year.  Pneumococcal polysaccharide immunization.** / 1 dose at age 65 (or older) if you have never been vaccinated.  Tetanus, diphtheria, pertussis (Tdap, Td) immunization. / A one-time dose of Tdap vaccine if you are over   65 and have contact with an infant, are a healthcare worker, or simply want to be protected from whooping cough. After that, you need a Td booster dose every 10 years.  Varicella immunization.** / Consult your caregiver.  Meningococcal immunization.** / Consult your caregiver.  Hepatitis A immunization.** / Consult your caregiver. 2 doses, 6 to 18 months apart.  Hepatitis B immunization.** / Check with your caregiver. 3 doses, usually over 6 months. ** Family history and personal history of risk and conditions may change your caregiver's recommendations. Document Released: 10/05/2001 Document Revised: 11/01/2011  Document Reviewed: 01/04/2011 [employer] Patient Information 2013 [employer]  

## 2012-07-06 NOTE — Progress Notes (Signed)
Subjective:    [name] is a 77 y.o. female who presents for Medicare Annual/Subsequent preventive examination.  Preventive Screening-Counseling & Management  Tobacco History  Smoking status  . Never Smoker   Smokeless tobacco  . Never Used     Problems Prior to Visit 1. cough  Current Problems (verified) Patient Active Problem List  Diagnosis  . SHINGLES  . EOSINOPHILIA  . DEPRESSION  . CARDIOMYOPATHY  . Sinoatrial node dysfunction  . DISEASE, TAKOTSUBO SYNDROME  . Acute Sinusitis, Unspecified  . RHINITIS  . ASTHMA  . BRONCHIECTASIS  . GERD  . PEPTIC ULCER DISEASE  . CONSTIPATION  . UTI  . OSTEOARTHRITIS  . OSTEOPOROSIS  . WEIGHT GAIN  . DYSPNEA  . DYSPNEA ON EXERTION  . COUGH  . ANA POSITIVE  . CERVICAL STRAIN  . HERPES ZOSTER, HX OF  . PACEMAKER, PERMANENT  . HYPERLIPIDEMIA  . HIP PAIN, LEFT  . Cerumen impaction  . Insomnia  . Chest pain  . Splenic infarct  . Atrial fibrillation-persistent  . Encounter for long-term (current) use of anticoagulants  . Unspecified adverse effect of unspecified drug, medicinal and biological substance    Medications Prior to Visit Current Outpatient Prescriptions on File Prior to Visit  Medication Sig Dispense Refill  . Ascorbic Acid (VITAMIN C) 1000 MG tablet Take 1,000 mg by mouth daily.        [name] atorvastatin (LIPITOR) 10 MG tablet 1 tab by mouth daily--repeat labs are due  30 tablet  0  . beclomethasone (QVAR) 80 MCG/ACT inhaler Inhale 2 puffs into the lungs 2 (two) times daily.  8.7 g  11  . budesonide (RHINOCORT AQUA) 32 MCG/ACT nasal spray Place 2 sprays into the nose daily.  1 Bottle  11  . Calcium Carbonate (CALCIUM 600) 1500 MG TABS Take 1 tablet by mouth daily.        . Cholecalciferol (VITAMIN D3) 1000 UNITS CAPS Take 1 capsule by mouth daily.        [name] escitalopram (LEXAPRO) 10 MG tablet TAKE 1 TABLET (10 MG TOTAL) BY MOUTH DAILY.  30 tablet  2  . esomeprazole (NEXIUM) 40 MG capsule Take 1 capsule (40  mg total) by mouth daily before breakfast.  30 capsule  11  . loratadine (CLARITIN) 10 MG tablet Take 10 mg by mouth daily.      . Multiple Vitamin (MULTIVITAMIN) tablet Take 1 tablet by mouth daily.        [name] NEXIUM 40 MG capsule TAKE ONE CAPSULE BY MOUTH EVERY DAY BEFORE BREAKFAST  30 capsule  5  . tiotropium (SPIRIVA HANDIHALER) 18 MCG inhalation capsule Place 1 capsule (18 mcg total) into inhaler and inhale daily.  30 capsule  11  . traZODone (DESYREL) 100 MG tablet Take 2 tablets (200 mg total) by mouth at bedtime.  60 tablet  0  . warfarin (COUMADIN) 2.5 MG tablet TAKE AS DIRECTED BY COUMADIN CLINIC  30 tablet  2  . [DISCONTINUED] fexofenadine (ALLEGRA) 180 MG tablet Take 180 mg by mouth daily.         Current Medications (verified) Current Outpatient Prescriptions  Medication Sig Dispense Refill  . Ascorbic Acid (VITAMIN C) 1000 MG tablet Take 1,000 mg by mouth daily.        [name] atorvastatin (LIPITOR) 10 MG tablet 1 tab by mouth daily--repeat labs are due  30 tablet  0  . beclomethasone (QVAR) 80 MCG/ACT inhaler Inhale 2 puffs into the lungs 2 (two) times daily.  8.7 g  11  . budesonide (RHINOCORT AQUA) 32 MCG/ACT nasal spray Place 2 sprays into the nose daily.  1 Bottle  11  . Calcium Carbonate (CALCIUM 600) 1500 MG TABS Take 1 tablet by mouth daily.        . Cholecalciferol (VITAMIN D3) 1000 UNITS CAPS Take 1 capsule by mouth daily.        [name] escitalopram (LEXAPRO) 10 MG tablet TAKE 1 TABLET (10 MG TOTAL) BY MOUTH DAILY.  30 tablet  2  . esomeprazole (NEXIUM) 40 MG capsule Take 1 capsule (40 mg total) by mouth daily before breakfast.  30 capsule  11  . loratadine (CLARITIN) 10 MG tablet Take 10 mg by mouth daily.      . Multiple Vitamin (MULTIVITAMIN) tablet Take 1 tablet by mouth daily.        [name] NEXIUM 40 MG capsule TAKE ONE CAPSULE BY MOUTH EVERY DAY BEFORE BREAKFAST  30 capsule  5  . tiotropium (SPIRIVA HANDIHALER) 18 MCG inhalation capsule Place 1 capsule (18 mcg total) into inhaler  and inhale daily.  30 capsule  11  . traZODone (DESYREL) 100 MG tablet Take 2 tablets (200 mg total) by mouth at bedtime.  60 tablet  0  . warfarin (COUMADIN) 2.5 MG tablet TAKE AS DIRECTED BY COUMADIN CLINIC  30 tablet  2  . [DISCONTINUED] fexofenadine (ALLEGRA) 180 MG tablet Take 180 mg by mouth daily.          Allergies (verified) Benzoyl peroxide and Neomycin-bacitracin zn-polymyx   PAST HISTORY  Family History Family History  Problem Relation Age of Onset  . Coronary artery disease      femal 1st degree relative  . Liver cancer Son   . Emphysema Sister   . Asthma Mother   . Depression Father     nervous breakdown    Social History History  Substance Use Topics  . Smoking status: Never Smoker   . Smokeless tobacco: Never Used  . Alcohol Use: No     Are there smokers in your home (other than you)? No  Risk Factors Current exercise habits: walking  Dietary issues discussed: na   Cardiac risk factors: advanced age (older than 58 for men, 74 for women), dyslipidemia and sedentary lifestyle.  Depression Screen (Note: if answer to either of the following is "Yes", a more complete depression screening is indicated)   Over the past two weeks, have you felt down, depressed or hopeless? No  Over the past two weeks, have you felt little interest or pleasure in doing things? No  Have you lost interest or pleasure in daily life? No  Do you often feel hopeless? No  Do you cry easily over simple problems? No  Activities of Daily Living In your present state of health, do you have any difficulty performing the following activities?:  Driving? Yes Managing money?  No Feeding yourself? No Getting from bed to chair? No Climbing a flight of stairs? No Preparing food and eating?: No Bathing or showering? No Getting dressed: No Getting to the toilet? No Using the toilet:No Moving around from place to place: No In the past year have you fallen or had a near fall?:No   Are  you sexually active?  Yes  Do you have more than one partner?  No  Hearing Difficulties: No Do you often ask people to speak up or repeat themselves? No Do you experience ringing or noises in your ears? No Do you have difficulty understanding soft  or whispered voices? No   Do you feel that you have a problem with memory? No  Do you often misplace items? No  Do you feel safe at home?  No  Cognitive Testing  Alert? Yes  Normal Appearance?Yes  Oriented to person? Yes  Place? Yes   Time? Yes  Recall of three objects?  Yes Can perform simple calculations? Yes  Displays appropriate judgment?Yes  Can read the correct time from a watch face?Yes   Advanced Directives have been discussed with the patient? Yes  List the Names of Other Physician/Practitioners you currently use: 1.  pulm--byrum 2. Cardio-- [facility]aciela Husbands, [facility] 3 dentist---lemon 4.  Eye--hecker Indicate any recent Medical Services you may have received from other than [facility] providers in the past year (date may be approximate).  Immunization History  Administered Date(s) Administered  . H1N1 09/05/2008  . Influenza Split 05/12/2011  . Influenza Whole 06/27/2007, 05/29/2008, 05/23/2009, 05/05/2010  . Pneumococcal Polysaccharide 05/23/2007  . Tdap 05/12/2011  . Zoster 06/01/2011    Screening Tests Health Maintenance  Topic Date Due  . Influenza Vaccine  04/23/2012  . Colonoscopy  07/06/2016  . Tetanus/tdap  05/11/2021  . Pneumococcal Polysaccharide Vaccine Age 43 And Over  Completed  . Zostavax  Completed    All answers were reviewed with the patient and necessary referrals were made:  Loreen Freud, DO   07/06/2012   History reviewed:  She  has a past medical history of Asthma; Rhinitis; Osteoarthritis; Osteoporosis; Peptic ulcer disease; Depression; Atrial fibrillation; Takotsubo cardiomyopathy; Shingles; Eosinophilia; Cardiomyopathy; Sinoatrial node dysfunction; Atrial arrhythmia; Sinusitis; Rhinitis; GERD  (gastroesophageal reflux disease); Bronchiectasis; Peptic ulcer disease; Osteoarthritis; Herpes zoster; Hyperlipemia; and Injury of splenic artery. She  does not have any pertinent problems on file. She  has past surgical history that includes Cholecystectomy; Tonsillectomy; Cataract extraction; Pacemaker insertion (09/29/06); and Cosmetic surgery. Her family history includes Asthma in her mother; Coronary artery disease in an unspecified family member; Depression in her father; Emphysema in her sister; and Liver cancer in her son. She  reports that she has never smoked. She has never used smokeless tobacco. She reports that she does not drink alcohol or use illicit drugs. She has a current medication list which includes the following prescription(s): vitamin c, atorvastatin, beclomethasone, budesonide, calcium carbonate, vitamin d3, escitalopram, esomeprazole, loratadine, multivitamin, nexium, tiotropium, trazodone, and warfarin. Current Outpatient Prescriptions on File Prior to Visit  Medication Sig Dispense Refill  . Ascorbic Acid (VITAMIN C) 1000 MG tablet Take 1,000 mg by mouth daily.        [name] atorvastatin (LIPITOR) 10 MG tablet 1 tab by mouth daily--repeat labs are due  30 tablet  0  . beclomethasone (QVAR) 80 MCG/ACT inhaler Inhale 2 puffs into the lungs 2 (two) times daily.  8.7 g  11  . budesonide (RHINOCORT AQUA) 32 MCG/ACT nasal spray Place 2 sprays into the nose daily.  1 Bottle  11  . Calcium Carbonate (CALCIUM 600) 1500 MG TABS Take 1 tablet by mouth daily.        . Cholecalciferol (VITAMIN D3) 1000 UNITS CAPS Take 1 capsule by mouth daily.        [name] escitalopram (LEXAPRO) 10 MG tablet TAKE 1 TABLET (10 MG TOTAL) BY MOUTH DAILY.  30 tablet  2  . esomeprazole (NEXIUM) 40 MG capsule Take 1 capsule (40 mg total) by mouth daily before breakfast.  30 capsule  11  . loratadine (CLARITIN) 10 MG tablet Take 10 mg by mouth daily.      [name]  Multiple Vitamin (MULTIVITAMIN) tablet Take 1 tablet by mouth  daily.        [name] NEXIUM 40 MG capsule TAKE ONE CAPSULE BY MOUTH EVERY DAY BEFORE BREAKFAST  30 capsule  5  . tiotropium (SPIRIVA HANDIHALER) 18 MCG inhalation capsule Place 1 capsule (18 mcg total) into inhaler and inhale daily.  30 capsule  11  . traZODone (DESYREL) 100 MG tablet Take 2 tablets (200 mg total) by mouth at bedtime.  60 tablet  0  . warfarin (COUMADIN) 2.5 MG tablet TAKE AS DIRECTED BY COUMADIN CLINIC  30 tablet  2  . [DISCONTINUED] fexofenadine (ALLEGRA) 180 MG tablet Take 180 mg by mouth daily.        She is allergic to benzoyl peroxide and neomycin-bacitracin zn-polymyx.  Review of Systems  Review of Systems  Constitutional: Negative for activity change, appetite change and fatigue.  HENT: Negative for hearing loss, congestion, tinnitus and ear discharge.   Eyes: Negative for visual disturbance (see optho q1y --cataracts) Respiratory: Negative for cough, chest tightness and shortness of breath.   Cardiovascular: Negative for chest pain, palpitations and leg swelling.  Gastrointestinal: Negative for abdominal pain, diarrhea, constipation and abdominal distention.  Genitourinary: Negative for urgency, frequency, decreased urine volume and difficulty urinating.  Musculoskeletal: Negative for back pain, arthralgias and gait problem.  Skin: Negative for color change, pallor and rash.  Neurological: Negative for dizziness, light-headedness, numbness and headaches.  Hematological: Negative for adenopathy. Does not bruise/bleed easily.  Psychiatric/Behavioral: Negative for suicidal ideas, confusion, sleep disturbance, self-injury, dysphoric mood, decreased concentration and agitation.  Pt is able to read and write and can do all ADLs No risk for falling No abuse/ violence in home     Objective:     Vision by Snellen chart: opth  Body mass index is 29.00 kg/(m^2). BP 112/68  Pulse 94  Temp 98 F (36.7 C) (Oral)  Ht 4' 11.25" (1.505 m)  Wt 144 lb 12.8 oz (65.681 kg)   BMI 29.00 kg/m2  SpO2 94%  BP 112/68  Pulse 94  Temp 98 F (36.7 C) (Oral)  Ht 4' 11.25" (1.505 m)  Wt 144 lb 12.8 oz (65.681 kg)  BMI 29.00 kg/m2  SpO2 94% General appearance: alert, cooperative, appears stated age and no distress Head: Normocephalic, without obvious abnormality, atraumatic Eyes: conjunctivae/corneas clear. PERRL, EOM's intact. Fundi benign. Ears: normal TM's and external ear canals both ears Nose: Nares normal. Septum midline. Mucosa normal. No drainage or sinus tenderness. Throat: lips, mucosa, and tongue normal; teeth and gums normal Neck: no adenopathy, no carotid bruit, no JVD, supple, symmetrical, trachea midline and thyroid not enlarged, symmetric, no tenderness/mass/nodules Back: symmetric, no curvature. ROM normal. No CVA tenderness. Lungs: dec BS b/l no wheeze----duoneb tx given in office with relief Breasts: normal appearance, no masses or tenderness Heart: regular rate and rhythm, S1, S2 normal, no murmur, click, rub or gallop Abdomen: soft, non-tender; bowel sounds normal; no masses,  no organomegaly Pelvic: deferred Extremities: extremities normal, atraumatic, no cyanosis or edema Pulses: 2+ and symmetric Skin: Skin color, texture, turgor normal. No rashes or lesions Lymph nodes: Cervical, supraclavicular, and axillary nodes normal. Neurologic: Alert and oriented X 3, normal strength and tone. Normal symmetric reflexes. Normal coordination and gait Psych--no depression, anxiety     Assessment:    cpe     Plan:     During the course of the visit the patient was educated and counseled about appropriate screening and preventive services including:    Pneumococcal  vaccine   Influenza vaccine  Hepatitis B vaccine  Screening mammography  Screening Pap smear and pelvic exam   Bone densitometry screening  Advanced directives: has an advanced directive - a copy HAS NOT been provided.  Diet review for nutrition referral? Yes ____  Not  Indicated _x___   Patient Instructions (the written plan) was given to the patient.  Medicare Attestation I have personally reviewed: The patient's medical and social history Their use of alcohol, tobacco or illicit drugs Their current medications and supplements The patient's functional ability including ADLs,fall risks, home safety risks, cognitive, and hearing and visual impairment Diet and physical activities Evidence for depression or mood disorders  The patient's weight, height, BMI, and visual acuity have been recorded in the chart.  I have made referrals, counseling, and provided education to the patient based on review of the above and I have provided the patient with a written personalized care plan for preventive services.     Loreen Freud, DO   07/06/2012

## 2012-07-07 NOTE — Assessment & Plan Note (Signed)
+   exacerbation dulera and proair samples given to pt F/u prn F/u pulmonary

## 2012-07-09 LAB — URINE CULTURE: Colony Count: >=100000

## 2012-07-11 MED ORDER — CIPROFLOXACIN HCL 500 MG PO TABS: 500.0000 mg | ORAL_TABLET | Freq: Two times a day (BID) | ORAL | Status: DC

## 2012-07-21 ENCOUNTER — Other Ambulatory Visit: Payer: Self-pay | Admitting: Family Medicine

## 2012-07-21 MED ORDER — TRAZODONE HCL 100 MG PO TABS: 200.0000 mg | ORAL_TABLET | Freq: Every day | ORAL | Status: DC

## 2012-07-21 NOTE — Telephone Encounter (Signed)
Please advise      KP 

## 2012-07-21 NOTE — Telephone Encounter (Signed)
Refill x1 

## 2012-07-21 NOTE — Telephone Encounter (Signed)
REFILL  TraZODone HCl (Tab) 100 MG Take 2 tablets (200 mg total) by mouth at bedtime. #60 last fill 10.28.13, last ov 11.14.13 V70 WT/LABS

## 2012-07-26 ENCOUNTER — Ambulatory Visit (INDEPENDENT_AMBULATORY_CARE_PROVIDER_SITE_OTHER): Payer: Medicare HMO | Admitting: *Deleted

## 2012-07-26 DIAGNOSIS — I480 Paroxysmal atrial fibrillation: Secondary | ICD-10-CM

## 2012-07-26 DIAGNOSIS — I4891 Unspecified atrial fibrillation: Secondary | ICD-10-CM

## 2012-07-26 DIAGNOSIS — D735 Infarction of spleen: Secondary | ICD-10-CM

## 2012-07-26 DIAGNOSIS — Z7901 Long term (current) use of anticoagulants: Secondary | ICD-10-CM

## 2012-07-26 DIAGNOSIS — D7389 Other diseases of spleen: Secondary | ICD-10-CM

## 2012-07-26 MED ORDER — WARFARIN SODIUM 2.5 MG PO TABS: 2.5000 mg | ORAL_TABLET | ORAL | Status: DC

## 2012-08-11 ENCOUNTER — Other Ambulatory Visit: Payer: Self-pay | Admitting: Family Medicine

## 2012-08-14 ENCOUNTER — Ambulatory Visit
Admission: RE | Admit: 2012-08-14 | Discharge: 2012-08-14 | Disposition: A | Discharge status: Home / Self Care | Payer: Medicare HMO | Source: Ambulatory Visit | Attending: Family Medicine | Admitting: Family Medicine

## 2012-08-14 DIAGNOSIS — Z1231 Encounter for screening mammogram for malignant neoplasm of breast: Secondary | ICD-10-CM

## 2012-08-21 ENCOUNTER — Other Ambulatory Visit: Payer: Self-pay | Admitting: Family Medicine

## 2012-08-21 NOTE — Telephone Encounter (Signed)
Last seen 07/06/12 and filled 07/21/12 # 60. Please advise      KP

## 2012-08-28 ENCOUNTER — Other Ambulatory Visit: Payer: Self-pay | Admitting: Family Medicine

## 2012-08-28 DIAGNOSIS — R928 Other abnormal and inconclusive findings on diagnostic imaging of breast: Secondary | ICD-10-CM

## 2012-09-04 ENCOUNTER — Ambulatory Visit
Admission: RE | Admit: 2012-09-04 | Discharge: 2012-09-04 | Disposition: A | Discharge status: Home / Self Care | Payer: Medicare HMO | Source: Ambulatory Visit | Attending: Family Medicine | Admitting: Family Medicine

## 2012-09-04 ENCOUNTER — Ambulatory Visit (INDEPENDENT_AMBULATORY_CARE_PROVIDER_SITE_OTHER): Payer: Medicare HMO | Admitting: *Deleted

## 2012-09-04 DIAGNOSIS — I495 Sick sinus syndrome: Secondary | ICD-10-CM

## 2012-09-04 DIAGNOSIS — R928 Other abnormal and inconclusive findings on diagnostic imaging of breast: Secondary | ICD-10-CM

## 2012-09-04 DIAGNOSIS — Z950 Presence of cardiac pacemaker: Secondary | ICD-10-CM

## 2012-09-04 LAB — REMOTE PACEMAKER DEVICE
BMOD-0005RV: 95 {beats}/min
BRDY-0004RV: 150 {beats}/min
RV LEAD IMPEDENCE PM: 476 Ohm
RV LEAD THRESHOLD: 0.875 V
VENTRICULAR PACING PM: 95

## 2012-09-06 ENCOUNTER — Ambulatory Visit (INDEPENDENT_AMBULATORY_CARE_PROVIDER_SITE_OTHER): Payer: Medicare HMO | Admitting: *Deleted

## 2012-09-06 DIAGNOSIS — D7389 Other diseases of spleen: Secondary | ICD-10-CM

## 2012-09-06 DIAGNOSIS — I4891 Unspecified atrial fibrillation: Secondary | ICD-10-CM

## 2012-09-06 DIAGNOSIS — Z7901 Long term (current) use of anticoagulants: Secondary | ICD-10-CM

## 2012-09-06 DIAGNOSIS — I480 Paroxysmal atrial fibrillation: Secondary | ICD-10-CM

## 2012-09-06 DIAGNOSIS — D735 Infarction of spleen: Secondary | ICD-10-CM

## 2012-09-18 ENCOUNTER — Encounter: Payer: Self-pay | Admitting: *Deleted

## 2012-09-19 ENCOUNTER — Other Ambulatory Visit: Payer: Self-pay | Admitting: Family Medicine

## 2012-09-19 NOTE — Telephone Encounter (Signed)
Last seen 07/06/12 and filled 08/21/12 # 60. Please advise     KP

## 2012-09-25 ENCOUNTER — Encounter: Payer: Self-pay | Admitting: Internal Medicine

## 2012-09-28 ENCOUNTER — Encounter: Payer: Self-pay | Admitting: Emergency Medicine

## 2012-09-28 ENCOUNTER — Ambulatory Visit (INDEPENDENT_AMBULATORY_CARE_PROVIDER_SITE_OTHER): Payer: Medicare HMO | Admitting: Emergency Medicine

## 2012-09-28 VITALS — BP 122/72 | HR 90 | Temp 97.0°F | Ht 59.0 in | Wt 146.2 lb

## 2012-09-28 DIAGNOSIS — J479 Bronchiectasis, uncomplicated: Secondary | ICD-10-CM

## 2012-09-28 DIAGNOSIS — J019 Acute sinusitis, unspecified: Secondary | ICD-10-CM | POA: Insufficient documentation

## 2012-09-28 MED ORDER — AMOXICILLIN-POT CLAVULANATE 875-125 MG PO TABS: 1.0000 | ORAL_TABLET | Freq: Two times a day (BID) | ORAL | Status: AC

## 2012-09-28 NOTE — Assessment & Plan Note (Signed)
-   continue spiriva + QVAR

## 2012-09-28 NOTE — Patient Instructions (Addendum)
Please contineu your current medications as you are taking them Take augmentin 875mg  twice a day for 10 days Follow with Dr Delton Coombes in  3 -4 weeks

## 2012-09-28 NOTE — Assessment & Plan Note (Signed)
Purulent nasal discharge, HA - start augmentin bid x 10 days - continue the [place], nasal steroid, loratadine - rov 3-4 weeks

## 2012-09-28 NOTE — Progress Notes (Signed)
Ms. Hochman is a 78 year old woman with a history of mild bronchiectasis and associated mild airflow limitation. She also has allergic rhinitis and postnasal drip. Taking fexofenadine and Rhinocort. Also on Spiriva + QVAR.   ROV 01/28/10 -- returns for f/u of her allergies, bronchiectasis, AFL. Has been seen by Dr Maple Hudson for allergy testing, skin testing negative. Nasal gtt is bothersome when she is outside and at night, white mucous. She is planning to have repeat skin testing Sept 1. Remains on QVAR and Spiriva. Also on fexafenadine + Rhinocort.   ROV 07/29/10 -- f/u for bronchiectasis, mild AFL, chronic rhinitis. She has been seen by Dr Maple Hudson, has negative skin testing since last visit. Has been on loratadine, allegra/fexofenadine, clarinex in the past. She is currently on name brand Allegra, causes her mouth and nose to be more dry, more nose bleeding. Cough stable. She feels more tired on the Allegra. Still on QVAR + Spiriva - although not clear that she has significant AFL, she believes she benefits.   ROV 04/01/11 -- bronchiectasis, mild AFL, chronic rhinitis with negative allergy w/u. She is on QVAR + Spiriva - although not clear that she has significant AFL, she believes she benefits. She was just at the beach - made her nasal gtt much worse. She remains on Rhinocort, but is unable to take allegra, loratadine, etc. She states that her DOE is slightly worse.   ROV 10/07/11 --  bronchiectasis, mild AFL, chronic rhinitis with negative allergy w/u. She is on QVAR + Spiriva.  She indicates that for the last few weeks she has had more allergic sx, more drainage, itchy eyes, etc. Bothers her at night when lying flat. She is on Rhinocort, went back on allegra last time. Dyspnea is stable.    ROV 03/31/12 -- bronchiectasis, mild AFL, chronic rhinitis with negative allergy w/u. She is on QVAR + Spiriva.  She continues to have cough, seems to hit her when supine. Tells me her SOB is stable. Remains on loratadine, uses  rhinocort but ran out a few days ago, needs refills. She is also Spiriva + QVAR. Uses [place] qod, PPI daily.   ROV 09/28/12 -- bronchiectasis, mild AFL, chronic rhinitis with negative allergy w/u. She is on QVAR + Spiriva. Presents today c/o more nasal congestion and obstruction, having purulent nasal discharge. Has had HA x 2- 3 weeks. No fevers. Hasn't affected breathing to large degree, but some increased exertional SOB.    Filed Vitals:   09/28/12 1117  BP: 122/72  Pulse: 90  Temp: 97 F (36.1 C)   Gen: Pleasant, well-nourished, in no distress,  normal affect  ENT: No lesions,  mouth clear,  oropharynx clear, no postnasal drip, hoarse voice  Neck: No JVD, no TMG, no carotid bruits  Lungs: No use of accessory muscles, few insp crackles L base  Cardiovascular: RRR, heart sounds normal, no murmur or gallops, no peripheral edema  Musculoskeletal: No deformities, no cyanosis or clubbing  Neuro: alert, non focal  Skin: Warm, no lesions or rashes   Acute sinusitis Purulent nasal discharge, HA - start augmentin bid x 10 days - continue the [place], nasal steroid, loratadine - rov 3-4 weeks  BRONCHIECTASIS - continue spiriva + QVAR

## 2012-10-18 ENCOUNTER — Ambulatory Visit (INDEPENDENT_AMBULATORY_CARE_PROVIDER_SITE_OTHER): Payer: Medicare HMO | Admitting: *Deleted

## 2012-10-18 LAB — POCT INR: INR: 2.1

## 2012-10-19 ENCOUNTER — Encounter: Payer: Self-pay | Admitting: Emergency Medicine

## 2012-10-19 ENCOUNTER — Telehealth: Payer: Self-pay | Admitting: Family Medicine

## 2012-10-19 ENCOUNTER — Ambulatory Visit (INDEPENDENT_AMBULATORY_CARE_PROVIDER_SITE_OTHER): Payer: Medicare HMO | Admitting: Emergency Medicine

## 2012-10-19 VITALS — BP 140/80 | HR 78 | Temp 97.8°F | Ht 59.0 in | Wt 147.8 lb

## 2012-10-19 DIAGNOSIS — J310 Chronic rhinitis: Secondary | ICD-10-CM

## 2012-10-19 DIAGNOSIS — J479 Bronchiectasis, uncomplicated: Secondary | ICD-10-CM

## 2012-10-19 DIAGNOSIS — J019 Acute sinusitis, unspecified: Secondary | ICD-10-CM

## 2012-10-19 MED ORDER — TRAZODONE HCL 100 MG PO TABS: ORAL_TABLET | ORAL | Status: DC

## 2012-10-19 NOTE — Progress Notes (Signed)
[name] is a 78 year old woman with a history of mild bronchiectasis and associated mild airflow limitation. She also has allergic rhinitis and postnasal drip. Taking fexofenadine and Rhinocort. Also on Spiriva + QVAR.   ROV 01/28/10 -- returns for f/u of her allergies, bronchiectasis, AFL. Has been seen by Dr Maple Hudson for allergy testing, skin testing negative. Nasal gtt is bothersome when she is outside and at night, white mucous. She is planning to have repeat skin testing Sept 1. Remains on QVAR and Spiriva. Also on fexafenadine + Rhinocort.   ROV 07/29/10 -- f/u for bronchiectasis, mild AFL, chronic rhinitis. She has been seen by Dr Maple Hudson, has negative skin testing since last visit. Has been on loratadine, allegra/fexofenadine, clarinex in the past. She is currently on name brand Allegra, causes her mouth and nose to be more dry, more nose bleeding. Cough stable. She feels more tired on the Allegra. Still on QVAR + Spiriva - although not clear that she has significant AFL, she believes she benefits.   ROV 04/01/11 -- bronchiectasis, mild AFL, chronic rhinitis with negative allergy w/u. She is on QVAR + Spiriva - although not clear that she has significant AFL, she believes she benefits. She was just at the beach - made her nasal gtt much worse. She remains on Rhinocort, but is unable to take allegra, loratadine, etc. She states that her DOE is slightly worse.   ROV 10/07/11 --  bronchiectasis, mild AFL, chronic rhinitis with negative allergy w/u. She is on QVAR + Spiriva.  She indicates that for the last few weeks she has had more allergic sx, more drainage, itchy eyes, etc. Bothers her at night when lying flat. She is on Rhinocort, went back on allegra last time. Dyspnea is stable.    ROV 03/31/12 -- bronchiectasis, mild AFL, chronic rhinitis with negative allergy w/u. She is on QVAR + Spiriva.  She continues to have cough, seems to hit her when supine. Tells me her SOB is stable. Remains on loratadine, uses  rhinocort but ran out a few days ago, needs refills. She is also Spiriva + QVAR. Uses [place] qod, PPI daily.   ROV 09/28/12 -- bronchiectasis, mild AFL, chronic rhinitis with negative allergy w/u. She is on QVAR + Spiriva. Presents today c/o more nasal congestion and obstruction, having purulent nasal discharge. Has had HA x 2- 3 weeks. No fevers. Hasn't affected breathing to large degree, but some increased exertional SOB.   ROV 10/19/12 -- bronchiectasis, mild AFL, chronic rhinitis with negative allergy w/u. Last time we treated acute sinusitis >> feeling much better, still some nasal congestion. Some nose dryness bleeding (on coumadin), taking the rhinocort, claritin. Feels her breathing is at baseline.    Filed Vitals:   10/19/12 1600  BP: 140/80  Pulse: 78  Temp: 97.8 F (36.6 C)   Gen: Pleasant, well-nourished, in no distress,  normal affect  ENT: No lesions,  mouth clear,  oropharynx clear, no postnasal drip, some dried blood nasal passages B  Neck: No JVD, no TMG, no carotid bruits  Lungs: No use of accessory muscles, few insp crackles L base  Cardiovascular: RRR, heart sounds normal, no murmur or gallops, no peripheral edema  Musculoskeletal: No deformities, no cyanosis or clubbing  Neuro: alert, non focal  Skin: Warm, no lesions or rashes   BRONCHIECTASIS Stable on current regimen  Acute sinusitis resolved  RHINITIS Her nasal passages have been dry, having some blood. Wil.l try to decrease her rhinocort to 1 spray daily, see if  this helps

## 2012-10-19 NOTE — Assessment & Plan Note (Signed)
Stable on current regimen   

## 2012-10-19 NOTE — Patient Instructions (Addendum)
Please try decreasing your Rhinocort to 1 spray each side daily Continue your other medications as you are taking them  Follow with Dr Delton Coombes in 4 months or sooner if you have any problems.

## 2012-10-19 NOTE — Telephone Encounter (Signed)
Last seen 07/06/12 and filled 09/19/12 #60. Please advise      KP

## 2012-10-19 NOTE — Addendum Note (Signed)
Addended by: Arnette Norris on: 10/19/2012 08:58 AM   Modules accepted: Orders

## 2012-10-19 NOTE — Telephone Encounter (Signed)
refill TraZODone HCl (Tab) 100 MG TAKE 2 TABLETS AT BEDTIME #60 last fill 1.28.14

## 2012-10-19 NOTE — Telephone Encounter (Signed)
Refill x1  3 refills 

## 2012-10-19 NOTE — Assessment & Plan Note (Signed)
Her nasal passages have been dry, having some blood. Wil.l try to decrease her rhinocort to 1 spray daily, see if this helps

## 2012-10-19 NOTE — Assessment & Plan Note (Signed)
resolved 

## 2012-11-09 ENCOUNTER — Other Ambulatory Visit: Payer: Self-pay | Admitting: Pharmacist

## 2012-11-09 ENCOUNTER — Other Ambulatory Visit: Payer: Self-pay | Admitting: Family Medicine

## 2012-11-09 MED ORDER — WARFARIN SODIUM 2.5 MG PO TABS: 2.5000 mg | ORAL_TABLET | ORAL | Status: DC

## 2012-11-15 ENCOUNTER — Encounter: Payer: Medicare HMO | Admitting: Internal Medicine

## 2012-11-17 ENCOUNTER — Encounter: Payer: Self-pay | Admitting: Internal Medicine

## 2012-11-17 ENCOUNTER — Ambulatory Visit (INDEPENDENT_AMBULATORY_CARE_PROVIDER_SITE_OTHER): Payer: Medicare HMO | Admitting: Internal Medicine

## 2012-11-17 VITALS — BP 120/70 | HR 94 | Ht 59.0 in | Wt 145.4 lb

## 2012-11-17 DIAGNOSIS — R0609 Other forms of dyspnea: Secondary | ICD-10-CM

## 2012-11-17 DIAGNOSIS — I480 Paroxysmal atrial fibrillation: Secondary | ICD-10-CM

## 2012-11-17 DIAGNOSIS — R0989 Other specified symptoms and signs involving the circulatory and respiratory systems: Secondary | ICD-10-CM

## 2012-11-17 DIAGNOSIS — I509 Heart failure, unspecified: Secondary | ICD-10-CM

## 2012-11-17 DIAGNOSIS — Z950 Presence of cardiac pacemaker: Secondary | ICD-10-CM

## 2012-11-17 DIAGNOSIS — I428 Other cardiomyopathies: Secondary | ICD-10-CM

## 2012-11-17 DIAGNOSIS — I4891 Unspecified atrial fibrillation: Secondary | ICD-10-CM

## 2012-11-17 LAB — PACEMAKER DEVICE OBSERVATION
BMOD-0005RV: 95 {beats}/min
BRDY-0002RV: 60 {beats}/min
BRDY-0004RV: 150 {beats}/min
RV LEAD AMPLITUDE: 15.67 mv

## 2012-11-17 NOTE — Assessment & Plan Note (Signed)
Heart block in the 3 control. On anticoagulation with warfarin

## 2012-11-17 NOTE — Progress Notes (Signed)
Patient Care Team: Lelon Perla, DO as PCP - General   HPI  [name] is a 78 y.o. female Is seen in followup   with a history of tako-tsubo nonischemic cardiomyopathy in 9/07 which has resolved. She also has a history of atrial flutter and is status post ablation as well as placement of a permanent pacemaker due to profound sinus node dysfunction.   The patient  Has had problems over the last 6 months with progressive dyspnea on exertion. It is unaccompanied by chest pain. He has seen pulmonary was treated with inhalers and a diagnosis of bronchiectasis and mild airflow limitation.. Chest x-ray showed only atelectasis and a poor inspiration. Chest CT scanning demonstrated no pulmonary embolism.  She denies edema or nocturnal dyspnea.    .There has been no syncope or presyncope.    Past Medical History  Diagnosis Date  . Asthma   . Rhinitis   . Osteoarthritis   . Osteoporosis   . Peptic ulcer disease   . Depression   . Atrial fibrillation     s/p ablation   . Takotsubo cardiomyopathy     resolved  . Shingles   . Eosinophilia   . Cardiomyopathy   . Sinoatrial node dysfunction   . Atrial arrhythmia   . Sinusitis   . Rhinitis   . GERD (gastroesophageal reflux disease)   . Bronchiectasis   . Peptic ulcer disease   . Osteoarthritis   . Herpes zoster   . Hyperlipemia   . Injury of splenic artery     infarct    Past Surgical History  Procedure Laterality Date  . Cholecystectomy    . Tonsillectomy    . Cataract extraction    . Pacemaker insertion  09/29/06  . Cosmetic surgery      Current Outpatient Prescriptions  Medication Sig Dispense Refill  . Ascorbic Acid (VITAMIN C) 1000 MG tablet Take 1,000 mg by mouth daily.        [name] atorvastatin (LIPITOR) 10 MG tablet 1 tab by mouth daily-  90 tablet  3  . beclomethasone (QVAR) 80 MCG/ACT inhaler Inhale 2 puffs into the lungs 2 (two) times daily.  8.7 g  11  . budesonide (RHINOCORT AQUA) 32 MCG/ACT nasal spray Place 2  sprays into the nose daily.  1 Bottle  11  . Calcium Carbonate (CALCIUM 600) 1500 MG TABS Take 1 tablet by mouth daily.        . Cholecalciferol (VITAMIN D3) 1000 UNITS CAPS Take 1 capsule by mouth daily.        [name] escitalopram (LEXAPRO) 10 MG tablet TAKE 1 TABLET (10 MG TOTAL) BY MOUTH DAILY.  30 tablet  5  . esomeprazole (NEXIUM) 40 MG capsule Take 1 capsule (40 mg total) by mouth daily before breakfast.  30 capsule  11  . loratadine (CLARITIN) 10 MG tablet Take 10 mg by mouth daily.      . Multiple Vitamin (MULTIVITAMIN) tablet Take 1 tablet by mouth daily.        [name] NEXIUM 40 MG capsule TAKE ONE CAPSULE BY MOUTH EVERY DAY BEFORE BREAKFAST  30 capsule  5  . tiotropium (SPIRIVA HANDIHALER) 18 MCG inhalation capsule Place 1 capsule (18 mcg total) into inhaler and inhale daily.  30 capsule  11  . traZODone (DESYREL) 100 MG tablet TAKE 2 TABLETS AT BEDTIME  60 tablet  3  . warfarin (COUMADIN) 2.5 MG tablet Take 1 tablet (2.5 mg total) by mouth as directed.  30  tablet  3  . [DISCONTINUED] fexofenadine (ALLEGRA) 180 MG tablet Take 180 mg by mouth daily.        No current facility-administered medications for this visit.    Allergies  Allergen Reactions  . Benzoyl Peroxide     REACTION: unspecified  . Neomycin-Bacitracin Zn-Polymyx     REACTION: unspecified    Review of Systems negative except from HPI and PMH  Physical Exam BP 120/70  Pulse 94  Ht 4\' 11"  (1.499 m)  Wt 145 lb 6.4 oz (65.953 kg)  BMI 29.35 kg/m2 Well developed and well nourished in no acute distress HENT normal E scleral and icterus clear Neck Supple JVP 8-10 carotids brisk and full Clear to ausculation Regular rate and rhythm, no murmurs gallops or rub Soft with active bowel sounds No clubbing cyanosis Trace Edema Alert and oriented, grossly normal motor and sensory function Skin Warm and Dry   ECG demonstrates atrial fibrillation and ventricular pacing  Assessment and  Plan

## 2012-11-17 NOTE — Assessment & Plan Note (Signed)
I suspect that this is a manifestation of heart failure. We'll check an echo to look at left ventricular function. We will reprogram her pacemaker to decrease her heart rates with exertion and occasionally thereby improve diastolic filling and decrease exercise associated elevations in LVEDP

## 2012-11-17 NOTE — Assessment & Plan Note (Signed)
Prior history of Tako Tsubo; symptoms of cardiac congestion we'll undertake repeat echo

## 2012-11-17 NOTE — Assessment & Plan Note (Signed)
The patient's device was interrogated and the information was fully reviewed.  The device was reprogrammed to   Decreased ADL rate as well as to lower the max sensor rate y

## 2012-11-17 NOTE — Patient Instructions (Addendum)
Remote monitoring is used to monitor your Pacemaker of ICD from home. This monitoring reduces the number of office visits required to check your device to one time per year. It allows [name] to keep an eye on the functioning of your device to ensure it is working properly. You are scheduled for a device check from home on 02/19/13. You may send your transmission at any time that day. If you have a wireless device, the transmission will be sent automatically. After your physician reviews your transmission, you will receive a postcard with your next transmission date.  Your physician has requested that you have an echocardiogram. Echocardiography is a painless test that uses sound waves to create images of your heart. It provides your doctor with information about the size and shape of your heart and how well your heart's chambers and valves are working. This procedure takes approximately one hour. There are no restrictions for this procedure.

## 2012-11-22 ENCOUNTER — Other Ambulatory Visit (HOSPITAL_COMMUNITY): Payer: Medicare HMO

## 2012-11-29 ENCOUNTER — Ambulatory Visit (HOSPITAL_COMMUNITY): Payer: Medicare HMO | Attending: Cardiovascular Disease | Admitting: Radiology

## 2012-11-29 ENCOUNTER — Ambulatory Visit (INDEPENDENT_AMBULATORY_CARE_PROVIDER_SITE_OTHER): Payer: Medicare HMO | Admitting: *Deleted

## 2012-11-29 DIAGNOSIS — D7389 Other diseases of spleen: Secondary | ICD-10-CM

## 2012-11-29 DIAGNOSIS — I480 Paroxysmal atrial fibrillation: Secondary | ICD-10-CM

## 2012-11-29 DIAGNOSIS — R0989 Other specified symptoms and signs involving the circulatory and respiratory systems: Secondary | ICD-10-CM | POA: Insufficient documentation

## 2012-11-29 DIAGNOSIS — I4891 Unspecified atrial fibrillation: Secondary | ICD-10-CM

## 2012-11-29 DIAGNOSIS — R0609 Other forms of dyspnea: Secondary | ICD-10-CM | POA: Insufficient documentation

## 2012-11-29 DIAGNOSIS — I5181 Takotsubo syndrome: Secondary | ICD-10-CM | POA: Insufficient documentation

## 2012-11-29 DIAGNOSIS — Z7901 Long term (current) use of anticoagulants: Secondary | ICD-10-CM

## 2012-11-29 DIAGNOSIS — D735 Infarction of spleen: Secondary | ICD-10-CM

## 2012-11-29 DIAGNOSIS — I509 Heart failure, unspecified: Secondary | ICD-10-CM | POA: Insufficient documentation

## 2012-11-29 LAB — POCT INR: INR: 2.4

## 2012-11-29 NOTE — Progress Notes (Signed)
Echocardiogram performed.  

## 2012-12-12 ENCOUNTER — Telehealth: Payer: Self-pay | Admitting: Internal Medicine

## 2012-12-12 NOTE — Telephone Encounter (Signed)
New  Problem   Pt stated someone called to give her test results, but didn't leave name.

## 2012-12-12 NOTE — Telephone Encounter (Signed)
Spoke with pt, aware of echo results. She reports that there has been very little improvement. Will make dr Graciela Husbands aware.

## 2012-12-14 ENCOUNTER — Telehealth: Payer: Self-pay | Admitting: *Deleted

## 2012-12-14 MED ORDER — FUROSEMIDE 20 MG PO TABS: 20.0000 mg | ORAL_TABLET | Freq: Every day | ORAL | Status: DC

## 2012-12-14 NOTE — Telephone Encounter (Signed)
Dicussed pt with dr Graciela Husbands, she has seen little improvement with her SOB from the reprograming of her device. Per dr Graciela Husbands, pt to start furosemide 20 mg once daily. She will increase potassium rich foods in her diet. She will call us back in 2 weeks with an update on her statis.

## 2013-01-10 ENCOUNTER — Ambulatory Visit (INDEPENDENT_AMBULATORY_CARE_PROVIDER_SITE_OTHER): Payer: Medicare HMO | Admitting: *Deleted

## 2013-01-10 DIAGNOSIS — D735 Infarction of spleen: Secondary | ICD-10-CM

## 2013-01-10 DIAGNOSIS — Z7901 Long term (current) use of anticoagulants: Secondary | ICD-10-CM

## 2013-01-10 DIAGNOSIS — I4891 Unspecified atrial fibrillation: Secondary | ICD-10-CM

## 2013-01-10 DIAGNOSIS — D7389 Other diseases of spleen: Secondary | ICD-10-CM

## 2013-01-10 DIAGNOSIS — I480 Paroxysmal atrial fibrillation: Secondary | ICD-10-CM

## 2013-01-27 ENCOUNTER — Other Ambulatory Visit: Payer: Self-pay | Admitting: Family Medicine

## 2013-02-12 ENCOUNTER — Other Ambulatory Visit: Payer: Self-pay | Admitting: Family Medicine

## 2013-02-12 NOTE — Telephone Encounter (Signed)
Last seen 07/06/12 and filled 10/19/12 #60 with 3 refills. please advise     KP

## 2013-02-19 ENCOUNTER — Encounter: Payer: Medicare HMO | Admitting: *Deleted

## 2013-02-21 ENCOUNTER — Encounter: Payer: Self-pay | Admitting: Emergency Medicine

## 2013-02-21 ENCOUNTER — Ambulatory Visit (INDEPENDENT_AMBULATORY_CARE_PROVIDER_SITE_OTHER): Payer: Medicare HMO | Admitting: *Deleted

## 2013-02-21 ENCOUNTER — Ambulatory Visit (INDEPENDENT_AMBULATORY_CARE_PROVIDER_SITE_OTHER): Payer: Medicare HMO | Admitting: Emergency Medicine

## 2013-02-21 VITALS — BP 130/80 | HR 69 | Temp 98.2°F | Ht 59.0 in | Wt 146.2 lb

## 2013-02-21 DIAGNOSIS — Z7901 Long term (current) use of anticoagulants: Secondary | ICD-10-CM

## 2013-02-21 DIAGNOSIS — J479 Bronchiectasis, uncomplicated: Secondary | ICD-10-CM

## 2013-02-21 DIAGNOSIS — J310 Chronic rhinitis: Secondary | ICD-10-CM

## 2013-02-21 DIAGNOSIS — D7389 Other diseases of spleen: Secondary | ICD-10-CM

## 2013-02-21 DIAGNOSIS — I4891 Unspecified atrial fibrillation: Secondary | ICD-10-CM

## 2013-02-21 DIAGNOSIS — D735 Infarction of spleen: Secondary | ICD-10-CM

## 2013-02-21 DIAGNOSIS — I480 Paroxysmal atrial fibrillation: Secondary | ICD-10-CM

## 2013-02-21 NOTE — Assessment & Plan Note (Signed)
Appears to be stable. She wants to stay on same regimen - spiriva  - QVAR

## 2013-02-21 NOTE — Assessment & Plan Note (Signed)
-   [place] - loratadine - Rhinocort 2 sprays each nostril qd, seems to be tolerating despite some occasional L nare bleeding.

## 2013-02-21 NOTE — Patient Instructions (Addendum)
Please continue your current medications as you are taking them  Get your flu shot this Fall Follow with Dr Delton Coombes in 6 months or sooner if you have any problems

## 2013-02-21 NOTE — Progress Notes (Signed)
Ms. Name is a 78 year old woman with a history of mild bronchiectasis and associated mild airflow limitation. She also has allergic rhinitis and postnasal drip. Taking fexofenadine and Rhinocort. Also on Spiriva + QVAR.   ROV 01/28/10 -- returns for f/u of her allergies, bronchiectasis, AFL. Has been seen by Dr Maple Hudson for allergy testing, skin testing negative. Nasal gtt is bothersome when she is outside and at night, white mucous. She is planning to have repeat skin testing Sept 1. Remains on QVAR and Spiriva. Also on fexafenadine + Rhinocort.   ROV 07/29/10 -- f/u for bronchiectasis, mild AFL, chronic rhinitis. She has been seen by Dr Maple Hudson, has negative skin testing since last visit. Has been on loratadine, allegra/fexofenadine, clarinex in the past. She is currently on name brand Allegra, causes her mouth and nose to be more dry, more nose bleeding. Cough stable. She feels more tired on the Allegra. Still on QVAR + Spiriva - although not clear that she has significant AFL, she believes she benefits.   ROV 04/01/11 -- bronchiectasis, mild AFL, chronic rhinitis with negative allergy w/u. She is on QVAR + Spiriva - although not clear that she has significant AFL, she believes she benefits. She was just at the beach - made her nasal gtt much worse. She remains on Rhinocort, but is unable to take allegra, loratadine, etc. She states that her DOE is slightly worse.   ROV 10/07/11 --  bronchiectasis, mild AFL, chronic rhinitis with negative allergy w/u. She is on QVAR + Spiriva.  She indicates that for the last few weeks she has had more allergic sx, more drainage, itchy eyes, etc. Bothers her at night when lying flat. She is on Rhinocort, went back on allegra last time. Dyspnea is stable.    ROV 03/31/12 -- bronchiectasis, mild AFL, chronic rhinitis with negative allergy w/u. She is on QVAR + Spiriva.  She continues to have cough, seems to hit her when supine. Tells me her SOB is stable. Remains on loratadine, uses  rhinocort but ran out a few days ago, needs refills. She is also Spiriva + QVAR. Uses [place] qod, PPI daily.   ROV 09/28/12 -- bronchiectasis, mild AFL, chronic rhinitis with negative allergy w/u. She is on QVAR + Spiriva. Presents today c/o more nasal congestion and obstruction, having purulent nasal discharge. Has had HA x 2- 3 weeks. No fevers. Hasn't affected breathing to large degree, but some increased exertional SOB.   ROV 10/19/12 -- bronchiectasis, mild AFL, chronic rhinitis with negative allergy w/u. Last time we treated acute sinusitis >> feeling much better, still some nasal congestion. Some nose dryness bleeding (on coumadin), taking the rhinocort, claritin. Feels her breathing is at baseline.   ROV 02/21/13 -- bronchiectasis, mild AFL, chronic rhinitis. She is doing [place], loratadine, rhinocort back to 2 sprays. Her breathing has been the same until the weather got hotter. She gets SOB with walking in from parking lot. Occasional cough. She does cough some at night, denies GERD as long as she is on nexium.    Filed Vitals:   02/21/13 1527  BP: 130/80  Pulse: 69  Temp: 98.2 F (36.8 C)   Gen: Pleasant, well-nourished, in no distress,  normal affect  ENT: No lesions,  mouth clear,  oropharynx clear, no postnasal drip  Neck: No JVD, no TMG, no carotid bruits  Lungs: No use of accessory muscles, few insp crackles L base  Cardiovascular: RRR, heart sounds normal, no murmur or gallops, no peripheral edema  Musculoskeletal: No  deformities, no cyanosis or clubbing  Neuro: alert, non focal  Skin: Warm, no lesions or rashes   BRONCHIECTASIS Appears to be stable. She wants to stay on same regimen - spiriva  - QVAR   RHINITIS - [place] - loratadine - Rhinocort 2 sprays each nostril qd, seems to be tolerating despite some occasional L nare bleeding.

## 2013-02-27 ENCOUNTER — Other Ambulatory Visit: Payer: Self-pay | Admitting: Internal Medicine

## 2013-02-28 ENCOUNTER — Encounter: Payer: Self-pay | Admitting: *Deleted

## 2013-03-06 ENCOUNTER — Ambulatory Visit (INDEPENDENT_AMBULATORY_CARE_PROVIDER_SITE_OTHER): Payer: Medicare HMO | Admitting: *Deleted

## 2013-03-06 DIAGNOSIS — Z950 Presence of cardiac pacemaker: Secondary | ICD-10-CM

## 2013-03-06 DIAGNOSIS — I4891 Unspecified atrial fibrillation: Secondary | ICD-10-CM

## 2013-03-06 DIAGNOSIS — I480 Paroxysmal atrial fibrillation: Secondary | ICD-10-CM

## 2013-03-13 ENCOUNTER — Other Ambulatory Visit: Payer: Self-pay | Admitting: Family Medicine

## 2013-03-13 LAB — REMOTE PACEMAKER DEVICE
BATTERY VOLTAGE: 2.76 V
BMOD-0005RV: 85 {beats}/min
RV LEAD IMPEDENCE PM: 467 Ohm
RV LEAD THRESHOLD: 1 V
VENTRICULAR PACING PM: 91

## 2013-03-15 ENCOUNTER — Encounter: Payer: Self-pay | Admitting: *Deleted

## 2013-03-25 ENCOUNTER — Other Ambulatory Visit: Payer: Self-pay | Admitting: Emergency Medicine

## 2013-03-26 NOTE — Telephone Encounter (Signed)
rx sent

## 2013-03-27 ENCOUNTER — Other Ambulatory Visit: Payer: Self-pay | Admitting: Emergency Medicine

## 2013-03-30 ENCOUNTER — Encounter: Payer: Self-pay | Admitting: Internal Medicine

## 2013-04-04 ENCOUNTER — Ambulatory Visit (INDEPENDENT_AMBULATORY_CARE_PROVIDER_SITE_OTHER): Payer: Medicare HMO | Admitting: *Deleted

## 2013-04-04 DIAGNOSIS — I480 Paroxysmal atrial fibrillation: Secondary | ICD-10-CM

## 2013-04-04 DIAGNOSIS — I4891 Unspecified atrial fibrillation: Secondary | ICD-10-CM

## 2013-04-04 DIAGNOSIS — Z7901 Long term (current) use of anticoagulants: Secondary | ICD-10-CM

## 2013-04-04 DIAGNOSIS — D7389 Other diseases of spleen: Secondary | ICD-10-CM

## 2013-04-04 DIAGNOSIS — D735 Infarction of spleen: Secondary | ICD-10-CM

## 2013-04-16 ENCOUNTER — Other Ambulatory Visit: Payer: Self-pay | Admitting: Emergency Medicine

## 2013-04-21 ENCOUNTER — Other Ambulatory Visit: Payer: Self-pay | Admitting: Family Medicine

## 2013-05-06 ENCOUNTER — Other Ambulatory Visit: Payer: Self-pay | Admitting: Emergency Medicine

## 2013-05-16 ENCOUNTER — Ambulatory Visit (INDEPENDENT_AMBULATORY_CARE_PROVIDER_SITE_OTHER): Payer: Medicare HMO | Admitting: *Deleted

## 2013-05-16 DIAGNOSIS — I480 Paroxysmal atrial fibrillation: Secondary | ICD-10-CM

## 2013-05-16 DIAGNOSIS — I4891 Unspecified atrial fibrillation: Secondary | ICD-10-CM

## 2013-05-16 DIAGNOSIS — D7389 Other diseases of spleen: Secondary | ICD-10-CM

## 2013-05-16 DIAGNOSIS — Z7901 Long term (current) use of anticoagulants: Secondary | ICD-10-CM

## 2013-05-16 DIAGNOSIS — D735 Infarction of spleen: Secondary | ICD-10-CM

## 2013-05-16 LAB — POCT INR: INR: 2

## 2013-06-05 ENCOUNTER — Other Ambulatory Visit: Payer: Self-pay | Admitting: Family Medicine

## 2013-06-06 NOTE — Telephone Encounter (Signed)
Last seen 07/06/12 and filled 02/12/13 #60 with 3 refills. Please advise      KP

## 2013-06-11 ENCOUNTER — Ambulatory Visit (INDEPENDENT_AMBULATORY_CARE_PROVIDER_SITE_OTHER): Payer: Medicare HMO | Admitting: *Deleted

## 2013-06-11 DIAGNOSIS — I495 Sick sinus syndrome: Secondary | ICD-10-CM

## 2013-06-11 DIAGNOSIS — Z950 Presence of cardiac pacemaker: Secondary | ICD-10-CM

## 2013-06-14 ENCOUNTER — Other Ambulatory Visit: Payer: Self-pay | Admitting: Family Medicine

## 2013-06-20 LAB — REMOTE PACEMAKER DEVICE
BMOD-0005RV: 85 {beats}/min
RV LEAD THRESHOLD: 1.125 V
VENTRICULAR PACING PM: 92

## 2013-06-27 ENCOUNTER — Ambulatory Visit (INDEPENDENT_AMBULATORY_CARE_PROVIDER_SITE_OTHER): Payer: Medicare HMO | Admitting: *Deleted

## 2013-06-27 DIAGNOSIS — I4891 Unspecified atrial fibrillation: Secondary | ICD-10-CM

## 2013-06-27 DIAGNOSIS — I480 Paroxysmal atrial fibrillation: Secondary | ICD-10-CM

## 2013-06-27 DIAGNOSIS — Z23 Encounter for immunization: Secondary | ICD-10-CM

## 2013-06-27 DIAGNOSIS — D735 Infarction of spleen: Secondary | ICD-10-CM

## 2013-06-27 DIAGNOSIS — Z7901 Long term (current) use of anticoagulants: Secondary | ICD-10-CM

## 2013-06-27 DIAGNOSIS — D7389 Other diseases of spleen: Secondary | ICD-10-CM

## 2013-06-29 ENCOUNTER — Encounter: Payer: Self-pay | Admitting: *Deleted

## 2013-07-06 ENCOUNTER — Telehealth: Payer: Self-pay

## 2013-07-06 NOTE — Telephone Encounter (Addendum)
Left message for call back Non identifiable Medication and allergies: reviewed and updated  90 day supply/mail order: na Local pharmacy: [facility] [address]   Immunizations due: UTD  A/P:   No changes to FH or PSH  MMG--08/2012--benign findings CCS--06/2006--per patient Flu vaccine--06/2013 Tdap vaccine 04/2011 Pneumonia vaccine--04/2007 Shingles--05/2011  To Discuss with Provider: Not at this time

## 2013-07-09 ENCOUNTER — Ambulatory Visit (INDEPENDENT_AMBULATORY_CARE_PROVIDER_SITE_OTHER): Payer: Medicare HMO | Admitting: Family Medicine

## 2013-07-09 ENCOUNTER — Encounter: Payer: Self-pay | Admitting: Internal Medicine

## 2013-07-09 ENCOUNTER — Encounter: Payer: Self-pay | Admitting: Family Medicine

## 2013-07-09 VITALS — BP 126/74 | HR 69 | Temp 98.1°F | Ht 59.5 in | Wt 148.6 lb

## 2013-07-09 DIAGNOSIS — I4891 Unspecified atrial fibrillation: Secondary | ICD-10-CM

## 2013-07-09 DIAGNOSIS — Z23 Encounter for immunization: Secondary | ICD-10-CM

## 2013-07-09 DIAGNOSIS — J45909 Unspecified asthma, uncomplicated: Secondary | ICD-10-CM

## 2013-07-09 DIAGNOSIS — M810 Age-related osteoporosis without current pathological fracture: Secondary | ICD-10-CM

## 2013-07-09 DIAGNOSIS — E2839 Other primary ovarian failure: Secondary | ICD-10-CM

## 2013-07-09 DIAGNOSIS — Z0000 Encounter for general adult medical examination without abnormal findings: Secondary | ICD-10-CM

## 2013-07-09 DIAGNOSIS — E785 Hyperlipidemia, unspecified: Secondary | ICD-10-CM

## 2013-07-09 DIAGNOSIS — I480 Paroxysmal atrial fibrillation: Secondary | ICD-10-CM

## 2013-07-09 LAB — CBC WITH DIFFERENTIAL/PLATELET
Basophils Absolute: 0.1 10*3/uL (ref 0.0–0.1)
Basophils Relative: 1.4 % (ref 0.0–3.0)
Eosinophils Absolute: 0.3 10*3/uL (ref 0.0–0.7)
Eosinophils Relative: 3 % (ref 0.0–5.0)
Lymphocytes Relative: 17 % (ref 12.0–46.0)
MCHC: 33.3 g/dL (ref 30.0–36.0)
Neutrophils Relative %: 69.1 % (ref 43.0–77.0)
RBC: 4.79 Mil/uL (ref 3.87–5.11)
RDW: 16.4 % — ABNORMAL HIGH (ref 11.5–14.6)

## 2013-07-09 LAB — HEPATIC FUNCTION PANEL
Alkaline Phosphatase: 90 U/L (ref 39–117)
Bilirubin, Direct: 0 mg/dL (ref 0.0–0.3)
Total Protein: 7.7 g/dL (ref 6.0–8.3)

## 2013-07-09 LAB — BASIC METABOLIC PANEL
CO2: 25 mEq/L (ref 19–32)
Calcium: 9.4 mg/dL (ref 8.4–10.5)
Creatinine, Ser: 0.6 mg/dL (ref 0.4–1.2)
GFR: 103.27 mL/min (ref >60.00)
Glucose, Bld: 92 mg/dL (ref 70–99)

## 2013-07-09 LAB — LIPID PANEL
HDL: 38.3 mg/dL — ABNORMAL LOW (ref >39.00)
Total CHOL/HDL Ratio: 3
VLDL: 15.2 mg/dL (ref 0.0–40.0)

## 2013-07-09 NOTE — Progress Notes (Signed)
Subjective:    [name] is a 78 y.o. female who presents for Medicare Annual/Subsequent preventive examination.  Preventive Screening-Counseling & Management  Tobacco History  Smoking status  . Never Smoker   Smokeless tobacco  . Never Used     Problems Prior to Visit 1. none  Current Problems (verified) Patient Active Problem List   Diagnosis Date Noted  . Unspecified adverse effect of unspecified drug, medicinal and biological substance 12/21/2011  . Encounter for long-term (current) use of anticoagulants 07/16/2011  . Chest pain 07/12/2011  . Splenic infarct 07/12/2011  . Atrial fibrillation-persistent 07/12/2011  . Insomnia 06/21/2011  . HYPERLIPIDEMIA 11/04/2010  . HIP PAIN, LEFT 11/04/2010  . RHINITIS 01/23/2010  . CERVICAL STRAIN 11/20/2009  . Sinoatrial node dysfunction 10/21/2009  . WEIGHT GAIN 10/21/2009  . DYSPNEA ON EXERTION 10/21/2009  . PACEMAKER, [employer]--programmed VVIR 10/21/2009  . UTI 06/07/2008  . COUGH 04/26/2008  . HERPES ZOSTER, HX OF 04/26/2008  . SHINGLES 04/15/2008  . CONSTIPATION 12/06/2007  . EOSINOPHILIA 07/06/2007  . CARDIOMYOPATHY 07/06/2007  . BRONCHIECTASIS 07/06/2007  . GERD 07/06/2007  . ANA POSITIVE 03/28/2007  . DEPRESSION 10/31/2006  . ASTHMA 10/31/2006  . PEPTIC ULCER DISEASE 10/31/2006  . OSTEOARTHRITIS 10/31/2006  . OSTEOPOROSIS 10/31/2006    Medications Prior to Visit Current Outpatient Prescriptions on File Prior to Visit  Medication Sig Dispense Refill  . Ascorbic Acid (VITAMIN C) 1000 MG tablet Take 1,000 mg by mouth daily.        [name] atorvastatin (LIPITOR) 10 MG tablet TAKE 1 TABLET EVERY DAY  90 tablet  0  . budesonide (RHINOCORT AQUA) 32 MCG/ACT nasal spray Place 1 spray into the nose daily.      . Calcium Carbonate (CALCIUM 600) 1500 MG TABS Take 1 tablet by mouth daily.        . Cholecalciferol (VITAMIN D3) 1000 UNITS CAPS Take 1 capsule by mouth daily.        [name] escitalopram (LEXAPRO) 10 MG tablet 1 tab  by mouth daily--office visit due now  30 tablet  0  . esomeprazole (NEXIUM) 40 MG capsule Take 1 capsule (40 mg total) by mouth daily before breakfast.  30 capsule  11  . furosemide (LASIX) 20 MG tablet Take 1 tablet (20 mg total) by mouth daily.  30 tablet  12  . loratadine (CLARITIN) 10 MG tablet Take 10 mg by mouth daily.      . Multiple Vitamin (MULTIVITAMIN) tablet Take 1 tablet by mouth daily.        [name] QVAR 80 MCG/ACT inhaler INHALE 2 PUFFS INTO THE LUNGS 2 (TWO) TIMES DAILY.  8.7 g  3  . SPIRIVA HANDIHALER 18 MCG inhalation capsule PLACE 1 CAPSULE (18 MCG TOTAL) INTO INHALER AND INHALE DAILY.  30 capsule  11  . traZODone (DESYREL) 100 MG tablet TAKE 2 TABLETS AT BEDTIME  60 tablet  3  . warfarin (COUMADIN) 2.5 MG tablet TAKE 1 TABLET (2.5 MG TOTAL) BY MOUTH AS DIRECTED.  30 tablet  3  . [DISCONTINUED] fexofenadine (ALLEGRA) 180 MG tablet Take 180 mg by mouth daily.        No current facility-administered medications on file prior to visit.    Current Medications (verified) Current Outpatient Prescriptions  Medication Sig Dispense Refill  . Ascorbic Acid (VITAMIN C) 1000 MG tablet Take 1,000 mg by mouth daily.        [name] atorvastatin (LIPITOR) 10 MG tablet TAKE 1 TABLET EVERY DAY  90 tablet  0  .  BEPREVE 1.5 % SOLN       . budesonide (RHINOCORT AQUA) 32 MCG/ACT nasal spray Place 1 spray into the nose daily.      . Calcium Carbonate (CALCIUM 600) 1500 MG TABS Take 1 tablet by mouth daily.        . Cholecalciferol (VITAMIN D3) 1000 UNITS CAPS Take 1 capsule by mouth daily.        [name] escitalopram (LEXAPRO) 10 MG tablet 1 tab by mouth daily--office visit due now  30 tablet  0  . esomeprazole (NEXIUM) 40 MG capsule Take 1 capsule (40 mg total) by mouth daily before breakfast.  30 capsule  11  . furosemide (LASIX) 20 MG tablet Take 1 tablet (20 mg total) by mouth daily.  30 tablet  12  . loratadine (CLARITIN) 10 MG tablet Take 10 mg by mouth daily.      . Multiple Vitamin (MULTIVITAMIN)  tablet Take 1 tablet by mouth daily.        [name] QVAR 80 MCG/ACT inhaler INHALE 2 PUFFS INTO THE LUNGS 2 (TWO) TIMES DAILY.  8.7 g  3  . SPIRIVA HANDIHALER 18 MCG inhalation capsule PLACE 1 CAPSULE (18 MCG TOTAL) INTO INHALER AND INHALE DAILY.  30 capsule  11  . traZODone (DESYREL) 100 MG tablet TAKE 2 TABLETS AT BEDTIME  60 tablet  3  . warfarin (COUMADIN) 2.5 MG tablet TAKE 1 TABLET (2.5 MG TOTAL) BY MOUTH AS DIRECTED.  30 tablet  3  . [DISCONTINUED] fexofenadine (ALLEGRA) 180 MG tablet Take 180 mg by mouth daily.        No current facility-administered medications for this visit.     Allergies (verified) Benzoyl peroxide and Neomycin-bacitracin zn-polymyx   PAST HISTORY  Family History Family History  Problem Relation Age of Onset  . Coronary artery disease      femal 1st degree relative  . Liver cancer Son   . Emphysema Sister   . Asthma Mother   . Depression Father     nervous breakdown    Social History History  Substance Use Topics  . Smoking status: Never Smoker   . Smokeless tobacco: Never Used  . Alcohol Use: No     Are there smokers in your home (other than you)? No  Risk Factors Current exercise habits: The patient does not participate in regular exercise at present.  Dietary issues discussed: na   Cardiac risk factors: advanced age (older than 45 for men, 70 for women), dyslipidemia and sedentary lifestyle.  Depression Screen (Note: if answer to either of the following is "Yes", a more complete depression screening is indicated)   Over the past two weeks, have you felt down, depressed or hopeless? No  Over the past two weeks, have you felt little interest or pleasure in doing things? No  Have you lost interest or pleasure in daily life? No  Do you often feel hopeless? No  Do you cry easily over simple problems? No  Activities of Daily Living In your present state of health, do you have any difficulty performing the following activities?:  Driving?  Yes Managing money?  No Feeding yourself? No Getting from bed to chair? No Climbing a flight of stairs? Yes Preparing food and eating?: No Bathing or showering? No Getting dressed: No Getting to the toilet? No Using the toilet:No Moving around from place to place: Yes----secondary to sob from asthma In the past year have you fallen or had a near fall?:No   Are you sexually  active?  Yes  Do you have more than one partner?  No  Hearing Difficulties: No Do you often ask people to speak up or repeat themselves? No Do you experience ringing or noises in your ears? No Do you have difficulty understanding soft or whispered voices? No   Do you feel that you have a problem with memory? No  Do you often misplace items? No  Do you feel safe at home?  Yes  Cognitive Testing  Alert? Yes  Normal Appearance?Yes  Oriented to person? Yes  Place? Yes   Time? Yes  Recall of three objects?  Yes  Can perform simple calculations? Yes  Displays appropriate judgment?Yes  Can read the correct time from a watch face?Yes   Advanced Directives have been discussed with the patient? Yes  List the Names of Other Physician/Practitioners you currently use: 1.  opth-- Hecker 2.  Dentist- [facility] 3card-- Graciela Husbands 4  pulm-- byrum   Indicate any recent Medical Services you may have received from other than [facility] providers in the past year (date may be approximate).  Immunization History  Administered Date(s) Administered  . H1N1 09/05/2008  . Influenza Split 05/12/2011, 07/06/2012  . Influenza Whole 06/27/2007, 05/29/2008, 05/23/2009, 05/05/2010  . Influenza,inj,Quad PF,36+ Mos 06/27/2013  . Pneumococcal Polysaccharide 05/23/2007  . Tdap 05/12/2011  . Zoster 06/01/2011    Screening Tests Health Maintenance  Topic Date Due  . Mammogram  09/04/2013  . Influenza Vaccine  03/23/2014  . Colonoscopy  07/06/2016  . Tetanus/tdap  05/11/2021  . Pneumococcal Polysaccharide Vaccine Age 89 And Over   Completed  . Zostavax  Completed    All answers were reviewed with the patient and necessary referrals were made:  Loreen Freud, DO   07/09/2013   History reviewed:  She  has a past medical history of Asthma; Rhinitis; Osteoarthritis; Osteoporosis; Peptic ulcer disease; Depression; Atrial fibrillation; Takotsubo cardiomyopathy; Shingles; Eosinophilia; Cardiomyopathy; Sinoatrial node dysfunction; Atrial arrhythmia; Sinusitis; Rhinitis; GERD (gastroesophageal reflux disease); Bronchiectasis; Peptic ulcer disease; Osteoarthritis; Herpes zoster; Hyperlipemia; and Injury of splenic artery. She  does not have any pertinent problems on file. She  has past surgical history that includes Cholecystectomy; Tonsillectomy; Cataract extraction; Pacemaker insertion (09/29/06); and Cosmetic surgery. Her family history includes Asthma in her mother; Coronary artery disease in an other family member; Depression in her father; Emphysema in her sister; Liver cancer in her son. She  reports that she has never smoked. She has never used smokeless tobacco. She reports that she does not drink alcohol or use illicit drugs. She has a current medication list which includes the following prescription(s): vitamin c, atorvastatin, bepreve, budesonide, calcium carbonate, vitamin d3, escitalopram, esomeprazole, furosemide, loratadine, multivitamin, qvar, spiriva handihaler, trazodone, and warfarin. Current Outpatient Prescriptions on File Prior to Visit  Medication Sig Dispense Refill  . Ascorbic Acid (VITAMIN C) 1000 MG tablet Take 1,000 mg by mouth daily.        [name] atorvastatin (LIPITOR) 10 MG tablet TAKE 1 TABLET EVERY DAY  90 tablet  0  . budesonide (RHINOCORT AQUA) 32 MCG/ACT nasal spray Place 1 spray into the nose daily.      . Calcium Carbonate (CALCIUM 600) 1500 MG TABS Take 1 tablet by mouth daily.        . Cholecalciferol (VITAMIN D3) 1000 UNITS CAPS Take 1 capsule by mouth daily.        [name] escitalopram (LEXAPRO) 10 MG  tablet 1 tab by mouth daily--office visit due now  30 tablet  0  .  esomeprazole (NEXIUM) 40 MG capsule Take 1 capsule (40 mg total) by mouth daily before breakfast.  30 capsule  11  . furosemide (LASIX) 20 MG tablet Take 1 tablet (20 mg total) by mouth daily.  30 tablet  12  . loratadine (CLARITIN) 10 MG tablet Take 10 mg by mouth daily.      . Multiple Vitamin (MULTIVITAMIN) tablet Take 1 tablet by mouth daily.        [name] QVAR 80 MCG/ACT inhaler INHALE 2 PUFFS INTO THE LUNGS 2 (TWO) TIMES DAILY.  8.7 g  3  . SPIRIVA HANDIHALER 18 MCG inhalation capsule PLACE 1 CAPSULE (18 MCG TOTAL) INTO INHALER AND INHALE DAILY.  30 capsule  11  . traZODone (DESYREL) 100 MG tablet TAKE 2 TABLETS AT BEDTIME  60 tablet  3  . warfarin (COUMADIN) 2.5 MG tablet TAKE 1 TABLET (2.5 MG TOTAL) BY MOUTH AS DIRECTED.  30 tablet  3  . [DISCONTINUED] fexofenadine (ALLEGRA) 180 MG tablet Take 180 mg by mouth daily.        No current facility-administered medications on file prior to visit.   She is allergic to benzoyl peroxide and neomycin-bacitracin zn-polymyx.  Review of Systems  Review of Systems  Constitutional: Negative for activity change, appetite change and fatigue.  HENT: Negative for hearing loss, congestion, tinnitus and ear discharge.   Eyes: Negative for visual disturbance (see optho q1y -- vision corrected to 20/20 with glasses).  Respiratory: Negative for cough, chest tightness and shortness of breath.   Cardiovascular: Negative for chest pain, palpitations and leg swelling.  Gastrointestinal: Negative for abdominal pain, diarrhea, constipation and abdominal distention.  Genitourinary: Negative for urgency, frequency, decreased urine volume and difficulty urinating.  Musculoskeletal: Negative for back pain, arthralgias and gait problem.  Skin: Negative for color change, pallor and rash.  Neurological: Negative for dizziness, light-headedness, numbness and headaches.  Hematological: Negative for  adenopathy. Does not bruise/bleed easily.  Psychiatric/Behavioral: Negative for suicidal ideas, confusion, sleep disturbance, self-injury, dysphoric mood, decreased concentration and agitation.  Pt is able to read and write and can do all ADLs No risk for falling No abuse/ violence in home      Objective:     Vision by Snellen chart: opth  Body mass index is 29.52 kg/(m^2). BP 126/74  Pulse 69  Temp(Src) 98.1 F (36.7 C) (Oral)  Ht 4' 11.5" (1.511 m)  Wt 148 lb 9.6 oz (67.405 kg)  BMI 29.52 kg/m2  SpO2 95%  BP 126/74  Pulse 69  Temp(Src) 98.1 F (36.7 C) (Oral)  Ht 4' 11.5" (1.511 m)  Wt 148 lb 9.6 oz (67.405 kg)  BMI 29.52 kg/m2  SpO2 95%  General Appearance:    Alert, cooperative, no distress, appears stated age  Head:    Normocephalic, without obvious abnormality, atraumatic  Eyes:    PERRL, conjunctiva/corneas clear, EOM's intact, fundi    benign, both eyes  Ears:    Normal TM's and external ear canals, both ears  Nose:   Nares normal, septum midline, mucosa normal, no drainage    or sinus tenderness  Throat:   Lips, mucosa, and tongue normal; teeth and gums normal  Neck:   Supple, symmetrical, trachea midline, no adenopathy;    thyroid:  no enlargement/tenderness/nodules; no carotid   bruit or JVD  Back:     Symmetric, no curvature, ROM normal, no CVA tenderness  Lungs:     Clear to auscultation bilaterally, respirations unlabored  Chest Wall:    No tenderness  or deformity   Heart:    Regular rate and rhythm, S1 and S2 normal, no murmur, rub   or gallop  Breast Exam:    No tenderness, masses, or nipple abnormality  Abdomen:     Soft, non-tender, bowel sounds active all four quadrants,    no masses, no organomegaly  Genitalia:    Normal female without lesion, discharge or tenderness  Rectal:    Normal tone, normal prostate, no masses or tenderness;   guaiac negative stool  Extremities:   Extremities normal, atraumatic, no cyanosis or edema  Pulses:   2+ and  symmetric all extremities  Skin:   Skin color, texture, turgor normal, no rashes or lesions  Lymph nodes:   Cervical, supraclavicular, and axillary nodes normal  Neurologic:   CNII-XII intact, normal strength, sensation and reflexes    throughout       Assessment:     cpe      Plan:     During the course of the visit the patient was educated and counseled about appropriate screening and preventive services including:    Pneumococcal vaccine   Influenza vaccine  Screening mammography  Bone densitometry screening  Glaucoma screening  Advanced directives: has an advanced directive - a copy HAS NOT been provided.  Diet review for nutrition referral? Yes ____  Not Indicated __x__   Patient Instructions (the written plan) was given to the patient.  Medicare Attestation I have personally reviewed: The patient's medical and social history Their use of alcohol, tobacco or illicit drugs Their current medications and supplements The patient's functional ability including ADLs,fall risks, home safety risks, cognitive, and hearing and visual impairment Diet and physical activities Evidence for depression or mood disorders  The patient's weight, height, BMI, and visual acuity have been recorded in the chart.  I have made referrals, counseling, and provided education to the patient based on review of the above and I have provided the patient with a written personalized care plan for preventive services.     Loreen Freud, DO   07/09/2013

## 2013-07-09 NOTE — Progress Notes (Signed)
Pre visit review using our clinic review tool, if applicable. No additional management support is needed unless otherwise documented below in the visit note. 

## 2013-07-09 NOTE — Assessment & Plan Note (Signed)
Stable  con't meds per cardiology 

## 2013-07-09 NOTE — Assessment & Plan Note (Signed)
con't meds bmd q2 y

## 2013-07-09 NOTE — Assessment & Plan Note (Signed)
con't meds  Check labs 

## 2013-07-09 NOTE — Patient Instructions (Signed)
Preventive Care for Adults, Female A healthy lifestyle and preventive care can promote health and wellness. Preventive health guidelines for women include the following key practices.  A routine yearly physical is a good way to check with your caregiver about your health and preventive screening. It is a chance to share any concerns and updates on your health, and to receive a thorough exam.  Visit your dentist for a routine exam and preventive care every 6 months. Brush your teeth twice a day and floss once a day. Good oral hygiene prevents tooth decay and gum disease.  The frequency of eye exams is based on your age, health, family medical history, use of contact lenses, and other factors. Follow your caregiver's recommendations for frequency of eye exams.  Eat a healthy diet. Foods like vegetables, fruits, whole grains, low-fat dairy products, and lean protein foods contain the nutrients you need without too many calories. Decrease your intake of foods high in solid fats, added sugars, and salt. Eat the right amount of calories for you.Get information about a proper diet from your caregiver, if necessary.  Regular physical exercise is one of the most important things you can do for your health. Most adults should get at least 150 minutes of moderate-intensity exercise (any activity that increases your heart rate and causes you to sweat) each week. In addition, most adults need muscle-strengthening exercises on 2 or more days a week.  Maintain a healthy weight. The body mass index (BMI) is a screening tool to identify possible weight problems. It provides an estimate of body fat based on height and weight. Your caregiver can help determine your BMI, and can help you achieve or maintain a healthy weight.For adults 20 years and older:  A BMI below 18.5 is considered underweight.  A BMI of 18.5 to 24.9 is normal.  A BMI of 25 to 29.9 is considered overweight.  A BMI of 30 and above is  considered obese.  Maintain normal blood lipids and cholesterol levels by exercising and minimizing your intake of saturated fat. Eat a balanced diet with plenty of fruit and vegetables. Blood tests for lipids and cholesterol should begin at age 20 and be repeated every 5 years. If your lipid or cholesterol levels are high, you are over 50, or you are at high risk for heart disease, you may need your cholesterol levels checked more frequently.Ongoing high lipid and cholesterol levels should be treated with medicines if diet and exercise are not effective.  If you smoke, find out from your caregiver how to quit. If you do not use tobacco, do not start.  Lung cancer screening is recommended for adults aged 55 80 years who are at high risk for developing lung cancer because of a history of smoking. Yearly low-dose computed tomography (CT) is recommended for people who have at least a 30-pack-year history of smoking and are a current smoker or have quit within the past 15 years. A pack year of smoking is smoking an average of 1 pack of cigarettes a day for 1 year (for example: 1 pack a day for 30 years or 2 packs a day for 15 years). Yearly screening should continue until the smoker has stopped smoking for at least 15 years. Yearly screening should also be stopped for people who develop a health problem that would prevent them from having lung cancer treatment.  If you are pregnant, do not drink alcohol. If you are breastfeeding, be very cautious about drinking alcohol. If you are   not pregnant and choose to drink alcohol, do not exceed 1 drink per day. One drink is considered to be 12 ounces (355 mL) of beer, 5 ounces (148 mL) of wine, or 1.5 ounces (44 mL) of liquor.  Avoid use of street drugs. Do not share needles with anyone. Ask for help if you need support or instructions about stopping the use of drugs.  High blood pressure causes heart disease and increases the risk of stroke. Your blood pressure  should be checked at least every 1 to 2 years. Ongoing high blood pressure should be treated with medicines if weight loss and exercise are not effective.  If you are 55 to 76 years old, ask your caregiver if you should take aspirin to prevent strokes.  Diabetes screening involves taking a blood sample to check your fasting blood sugar level. This should be done once every 3 years, after age 45, if you are within normal weight and without risk factors for diabetes. Testing should be considered at a younger age or be carried out more frequently if you are overweight and have at least 1 risk factor for diabetes.  Breast cancer screening is essential preventive care for women. You should practice "breast self-awareness." This means understanding the normal appearance and feel of your breasts and may include breast self-examination. Any changes detected, no matter how small, should be reported to a caregiver. Women in their 20s and 30s should have a clinical breast exam (CBE) by a caregiver as part of a regular health exam every 1 to 3 years. After age 40, women should have a CBE every year. Starting at age 40, women should consider having a mammography (breast X-ray test) every year. Women who have a family history of breast cancer should talk to their caregiver about genetic screening. Women at a high risk of breast cancer should talk to their caregivers about having magnetic resonance imaging (MRI) and a mammography every year.  Breast cancer gene (BRCA)-related cancer risk assessment is recommended for women who have family members with BRCA-related cancers. BRCA-related cancers include breast, ovarian, tubal, and peritoneal cancers. Having family members with these cancers may be associated with an increased risk for harmful changes (mutations) in the breast cancer genes BRCA1 and BRCA2. Results of the assessment will determine the need for genetic counseling and BRCA1 and BRCA2 testing.  The Pap test is  a screening test for cervical cancer. A Pap test can show cell changes on the cervix that might become cervical cancer if left untreated. A Pap test is a procedure in which cells are obtained and examined from the lower end of the uterus (cervix).  Women should have a Pap test starting at age 21.  Between ages 21 and 29, Pap tests should be repeated every 2 years.  Beginning at age 30, you should have a Pap test every 3 years as long as the past 3 Pap tests have been normal.  Some women have medical problems that increase the chance of getting cervical cancer. Talk to your caregiver about these problems. It is especially important to talk to your caregiver if a new problem develops soon after your last Pap test. In these cases, your caregiver may recommend more frequent screening and Pap tests.  The above recommendations are the same for women who have or have not gotten the vaccine for human papillomavirus (HPV).  If you had a hysterectomy for a problem that was not cancer or a condition that could lead to cancer, then   you no longer need Pap tests. Even if you no longer need a Pap test, a regular exam is a good idea to make sure no other problems are starting.  If you are between ages 65 and 70, and you have had normal Pap tests going back 10 years, you no longer need Pap tests. Even if you no longer need a Pap test, a regular exam is a good idea to make sure no other problems are starting.  If you have had past treatment for cervical cancer or a condition that could lead to cancer, you need Pap tests and screening for cancer for at least 20 years after your treatment.  If Pap tests have been discontinued, risk factors (such as a new sexual partner) need to be reassessed to determine if screening should be resumed.  The HPV test is an additional test that may be used for cervical cancer screening. The HPV test looks for the virus that can cause the cell changes on the cervix. The cells collected  during the Pap test can be tested for HPV. The HPV test could be used to screen women aged 30 years and older, and should be used in women of any age who have unclear Pap test results. After the age of 30, women should have HPV testing at the same frequency as a Pap test.  Colorectal cancer can be detected and often prevented. Most routine colorectal cancer screening begins at the age of 50 and continues through age 75. However, your caregiver may recommend screening at an earlier age if you have risk factors for colon cancer. On a yearly basis, your caregiver may provide home test kits to check for hidden blood in the stool. Use of a small camera at the end of a tube, to directly examine the colon (sigmoidoscopy or colonoscopy), can detect the earliest forms of colorectal cancer. Talk to your caregiver about this at age 50, when routine screening begins. Direct examination of the colon should be repeated every 5 to 10 years through age 75, unless early forms of pre-cancerous polyps or small growths are found.  Hepatitis C blood testing is recommended for all people born from 1945 through 1965 and any individual with known risks for hepatitis C.  Practice safe sex. Use condoms and avoid high-risk sexual practices to reduce the spread of sexually transmitted infections (STIs). STIs include gonorrhea, chlamydia, syphilis, trichomonas, herpes, HPV, and human immunodeficiency virus (HIV). Herpes, HIV, and HPV are viral illnesses that have no cure. They can result in disability, cancer, and death. Sexually active women aged 25 and younger should be checked for chlamydia. Older women with new or multiple partners should also be tested for chlamydia. Testing for other STIs is recommended if you are sexually active and at increased risk.  Osteoporosis is a disease in which the bones lose minerals and strength with aging. This can result in serious bone fractures. The risk of osteoporosis can be identified using a  bone density scan. Women ages 65 and over and women at risk for fractures or osteoporosis should discuss screening with their caregivers. Ask your caregiver whether you should take a calcium supplement or vitamin D to reduce the rate of osteoporosis.  Menopause can be associated with physical symptoms and risks. Hormone replacement therapy is available to decrease symptoms and risks. You should talk to your caregiver about whether hormone replacement therapy is right for you.  Use sunscreen. Apply sunscreen liberally and repeatedly throughout the day. You should seek shade   when your shadow is shorter than you. Protect yourself by wearing long sleeves, pants, a wide-brimmed hat, and sunglasses year round, whenever you are outdoors.  Once a month, do a whole body skin exam, using a mirror to look at the skin on your back. Notify your caregiver of new moles, moles that have irregular borders, moles that are larger than a pencil eraser, or moles that have changed in shape or color.  Stay current with required immunizations.  Influenza vaccine. All adults should be immunized every year.  Tetanus, diphtheria, and acellular pertussis (Td, Tdap) vaccine. Pregnant women should receive 1 dose of Tdap vaccine during each pregnancy. The dose should be obtained regardless of the length of time since the last dose. Immunization is preferred during the 27th to 36th week of gestation. An adult who has not previously received Tdap or who does not know her vaccine status should receive 1 dose of Tdap. This initial dose should be followed by tetanus and diphtheria toxoids (Td) booster doses every 10 years. Adults with an unknown or incomplete history of completing a 3-dose immunization series with Td-containing vaccines should begin or complete a primary immunization series including a Tdap dose. Adults should receive a Td booster every 10 years.  Varicella vaccine. An adult without evidence of immunity to varicella  should receive 2 doses or a second dose if she has previously received 1 dose. Pregnant females who do not have evidence of immunity should receive the first dose after pregnancy. This first dose should be obtained before leaving the health care facility. The second dose should be obtained 4 8 weeks after the first dose.  Human papillomavirus (HPV) vaccine. Females aged 13 26 years who have not received the vaccine previously should obtain the 3-dose series. The vaccine is not recommended for use in pregnant females. However, pregnancy testing is not needed before receiving a dose. If a female is found to be pregnant after receiving a dose, no treatment is needed. In that case, the remaining doses should be delayed until after the pregnancy. Immunization is recommended for any person with an immunocompromised condition through the age of 26 years if she did not get any or all doses earlier. During the 3-dose series, the second dose should be obtained 4 8 weeks after the first dose. The third dose should be obtained 24 weeks after the first dose and 16 weeks after the second dose.  Zoster vaccine. One dose is recommended for adults aged 60 years or older unless certain conditions are present.  Measles, mumps, and rubella (MMR) vaccine. Adults born before 1957 generally are considered immune to measles and mumps. Adults born in 1957 or later should have 1 or more doses of MMR vaccine unless there is a contraindication to the vaccine or there is laboratory evidence of immunity to each of the three diseases. A routine second dose of MMR vaccine should be obtained at least 28 days after the first dose for students attending postsecondary schools, health care workers, or international travelers. People who received inactivated measles vaccine or an unknown type of measles vaccine during 1963 1967 should receive 2 doses of MMR vaccine. People who received inactivated mumps vaccine or an unknown type of mumps vaccine  before 1979 and are at high risk for mumps infection should consider immunization with 2 doses of MMR vaccine. For females of childbearing age, rubella immunity should be determined. If there is no evidence of immunity, females who are not pregnant should be vaccinated. If there   is no evidence of immunity, females who are pregnant should delay immunization until after pregnancy. Unvaccinated health care workers born before 1957 who lack laboratory evidence of measles, mumps, or rubella immunity or laboratory confirmation of disease should consider measles and mumps immunization with 2 doses of MMR vaccine or rubella immunization with 1 dose of MMR vaccine.  Pneumococcal 13-valent conjugate (PCV13) vaccine. When indicated, a person who is uncertain of her immunization history and has no record of immunization should receive the PCV13 vaccine. An adult aged 19 years or older who has certain medical conditions and has not been previously immunized should receive 1 dose of PCV13 vaccine. This PCV13 should be followed with a dose of pneumococcal polysaccharide (PPSV23) vaccine. The PPSV23 vaccine dose should be obtained at least 8 weeks after the dose of PCV13 vaccine. An adult aged 19 years or older who has certain medical conditions and previously received 1 or more doses of PPSV23 vaccine should receive 1 dose of PCV13. The PCV13 vaccine dose should be obtained 1 or more years after the last PPSV23 vaccine dose.  Pneumococcal polysaccharide (PPSV23) vaccine. When PCV13 is also indicated, PCV13 should be obtained first. All adults aged 65 years and older should be immunized. An adult younger than age 65 years who has certain medical conditions should be immunized. Any person who resides in a nursing home or long-term care facility should be immunized. An adult smoker should be immunized. People with an immunocompromised condition and certain other conditions should receive both PCV13 and PPSV23 vaccines. People  with human immunodeficiency virus (HIV) infection should be immunized as soon as possible after diagnosis. Immunization during chemotherapy or radiation therapy should be avoided. Routine use of PPSV23 vaccine is not recommended for American Indians, [place] Natives, or people younger than 65 years unless there are medical conditions that require PPSV23 vaccine. When indicated, people who have unknown immunization and have no record of immunization should receive PPSV23 vaccine. One-time revaccination 5 years after the first dose of PPSV23 is recommended for people aged 19 64 years who have chronic kidney failure, nephrotic syndrome, asplenia, or immunocompromised conditions. People who received 1 2 doses of PPSV23 before age 65 years should receive another dose of PPSV23 vaccine at age 65 years or later if at least 5 years have passed since the previous dose. Doses of PPSV23 are not needed for people immunized with PPSV23 at or after age 65 years.  Meningococcal vaccine. Adults with asplenia or persistent complement component deficiencies should receive 2 doses of quadrivalent meningococcal conjugate (MenACWY-D) vaccine. The doses should be obtained at least 2 months apart. Microbiologists working with certain meningococcal bacteria, military recruits, people at risk during an outbreak, and people who travel to or live in countries with a high rate of meningitis should be immunized. A first-year college student up through age 21 years who is living in a residence hall should receive a dose if she did not receive a dose on or after her 16th birthday. Adults who have certain high-risk conditions should receive one or more doses of vaccine.  Hepatitis A vaccine. Adults who wish to be protected from this disease, have certain high-risk conditions, work with hepatitis A-infected animals, work in hepatitis A research labs, or travel to or work in countries with a high rate of hepatitis A should be immunized. Adults  who were previously unvaccinated and who anticipate close contact with an international adoptee during the first 60 days after arrival in the [place] from a country   with a high rate of hepatitis A should be immunized.  Hepatitis B vaccine. Adults who wish to be protected from this disease, have certain high-risk conditions, may be exposed to blood or other infectious body fluids, are household contacts or sex partners of hepatitis B positive people, are clients or workers in certain care facilities, or travel to or work in countries with a high rate of hepatitis B should be immunized.  Haemophilus influenzae type b (Hib) vaccine. A previously unvaccinated person with asplenia or sickle cell disease or having a scheduled splenectomy should receive 1 dose of Hib vaccine. Regardless of previous immunization, a recipient of a hematopoietic stem cell transplant should receive a 3-dose series 6 12 months after her successful transplant. Hib vaccine is not recommended for adults with HIV infection. Preventive Services / Frequency Ages 19 to 39  Blood pressure check.** / Every 1 to 2 years.  Lipid and cholesterol check.** / Every 5 years beginning at age 20.  Clinical breast exam.** / Every 3 years for women in their 20s and 30s.  BRCA-related cancer risk assessment.** / For women who have family members with a BRCA-related cancer (breast, ovarian, tubal, or peritoneal cancers).  Pap test.** / Every 2 years from ages 21 through 29. Every 3 years starting at age 30 through age 65 or 70 with a history of 3 consecutive normal Pap tests.  HPV screening.** / Every 3 years from ages 30 through ages 65 to 70 with a history of 3 consecutive normal Pap tests.  Hepatitis C blood test.** / For any individual with known risks for hepatitis C.  Skin self-exam. / Monthly.  Influenza vaccine. / Every year.  Tetanus, diphtheria, and acellular pertussis (Tdap, Td) vaccine.** / Consult your caregiver. Pregnant  women should receive 1 dose of Tdap vaccine during each pregnancy. 1 dose of Td every 10 years.  Varicella vaccine.** / Consult your caregiver. Pregnant females who do not have evidence of immunity should receive the first dose after pregnancy.  HPV vaccine. / 3 doses over 6 months, if 26 and younger. The vaccine is not recommended for use in pregnant females. However, pregnancy testing is not needed before receiving a dose.  Measles, mumps, rubella (MMR) vaccine.** / You need at least 1 dose of MMR if you were born in 1957 or later. You may also need a 2nd dose. For females of childbearing age, rubella immunity should be determined. If there is no evidence of immunity, females who are not pregnant should be vaccinated. If there is no evidence of immunity, females who are pregnant should delay immunization until after pregnancy.  Pneumococcal 13-valent conjugate (PCV13) vaccine.** / Consult your caregiver.  Pneumococcal polysaccharide (PPSV23) vaccine.** / 1 to 2 doses if you smoke cigarettes or if you have certain conditions.  Meningococcal vaccine.** / 1 dose if you are age 19 to 21 years and a first-year college student living in a residence hall, or have one of several medical conditions, you need to get vaccinated against meningococcal disease. You may also need additional booster doses.  Hepatitis A vaccine.** / Consult your caregiver.  Hepatitis B vaccine.** / Consult your caregiver.  Haemophilus influenzae type b (Hib) vaccine.** / Consult your caregiver. Ages 40 to 64  Blood pressure check.** / Every 1 to 2 years.  Lipid and cholesterol check.** / Every 5 years beginning at age 20.  Lung cancer screening. / Every year if you are aged 55 80 years and have a 30-pack-year history of smoking and   currently smoke or have quit within the past 15 years. Yearly screening is stopped once you have quit smoking for at least 15 years or develop a health problem that would prevent you from having  lung cancer treatment.  Clinical breast exam.** / Every year after age 40.  BRCA-related cancer risk assessment.** / For women who have family members with a BRCA-related cancer (breast, ovarian, tubal, or peritoneal cancers).  Mammogram.** / Every year beginning at age 40 and continuing for as long as you are in good health. Consult with your caregiver.  Pap test.** / Every 3 years starting at age 30 through age 65 or 70 with a history of 3 consecutive normal Pap tests.  HPV screening.** / Every 3 years from ages 30 through ages 65 to 70 with a history of 3 consecutive normal Pap tests.  Fecal occult blood test (FOBT) of stool. / Every year beginning at age 50 and continuing until age 75. You may not need to do this test if you get a colonoscopy every 10 years.  Flexible sigmoidoscopy or colonoscopy.** / Every 5 years for a flexible sigmoidoscopy or every 10 years for a colonoscopy beginning at age 50 and continuing until age 75.  Hepatitis C blood test.** / For all people born from 1945 through 1965 and any individual with known risks for hepatitis C.  Skin self-exam. / Monthly.  Influenza vaccine. / Every year.  Tetanus, diphtheria, and acellular pertussis (Tdap/Td) vaccine.** / Consult your caregiver. Pregnant women should receive 1 dose of Tdap vaccine during each pregnancy. 1 dose of Td every 10 years.  Varicella vaccine.** / Consult your caregiver. Pregnant females who do not have evidence of immunity should receive the first dose after pregnancy.  Zoster vaccine.** / 1 dose for adults aged 60 years or older.  Measles, mumps, rubella (MMR) vaccine.** / You need at least 1 dose of MMR if you were born in 1957 or later. You may also need a 2nd dose. For females of childbearing age, rubella immunity should be determined. If there is no evidence of immunity, females who are not pregnant should be vaccinated. If there is no evidence of immunity, females who are pregnant should delay  immunization until after pregnancy.  Pneumococcal 13-valent conjugate (PCV13) vaccine.** / Consult your caregiver.  Pneumococcal polysaccharide (PPSV23) vaccine.** / 1 to 2 doses if you smoke cigarettes or if you have certain conditions.  Meningococcal vaccine.** / Consult your caregiver.  Hepatitis A vaccine.** / Consult your caregiver.  Hepatitis B vaccine.** / Consult your caregiver.  Haemophilus influenzae type b (Hib) vaccine.** / Consult your caregiver. Ages 65 and over  Blood pressure check.** / Every 1 to 2 years.  Lipid and cholesterol check.** / Every 5 years beginning at age 20.  Lung cancer screening. / Every year if you are aged 55 80 years and have a 30-pack-year history of smoking and currently smoke or have quit within the past 15 years. Yearly screening is stopped once you have quit smoking for at least 15 years or develop a health problem that would prevent you from having lung cancer treatment.  Clinical breast exam.** / Every year after age 40.  BRCA-related cancer risk assessment.** / For women who have family members with a BRCA-related cancer (breast, ovarian, tubal, or peritoneal cancers).  Mammogram.** / Every year beginning at age 40 and continuing for as long as you are in good health. Consult with your caregiver.  Pap test.** / Every 3 years starting at age   30 through age 65 or 70 with a 3 consecutive normal Pap tests. Testing can be stopped between 65 and 70 with 3 consecutive normal Pap tests and no abnormal Pap or HPV tests in the past 10 years.  HPV screening.** / Every 3 years from ages 30 through ages 65 or 70 with a history of 3 consecutive normal Pap tests. Testing can be stopped between 65 and 70 with 3 consecutive normal Pap tests and no abnormal Pap or HPV tests in the past 10 years.  Fecal occult blood test (FOBT) of stool. / Every year beginning at age 50 and continuing until age 75. You may not need to do this test if you get a colonoscopy  every 10 years.  Flexible sigmoidoscopy or colonoscopy.** / Every 5 years for a flexible sigmoidoscopy or every 10 years for a colonoscopy beginning at age 50 and continuing until age 75.  Hepatitis C blood test.** / For all people born from 1945 through 1965 and any individual with known risks for hepatitis C.  Osteoporosis screening.** / A one-time screening for women ages 65 and over and women at risk for fractures or osteoporosis.  Skin self-exam. / Monthly.  Influenza vaccine. / Every year.  Tetanus, diphtheria, and acellular pertussis (Tdap/Td) vaccine.** / 1 dose of Td every 10 years.  Varicella vaccine.** / Consult your caregiver.  Zoster vaccine.** / 1 dose for adults aged 60 years or older.  Pneumococcal 13-valent conjugate (PCV13) vaccine.** / Consult your caregiver.  Pneumococcal polysaccharide (PPSV23) vaccine.** / 1 dose for all adults aged 65 years and older.  Meningococcal vaccine.** / Consult your caregiver.  Hepatitis A vaccine.** / Consult your caregiver.  Hepatitis B vaccine.** / Consult your caregiver.  Haemophilus influenzae type b (Hib) vaccine.** / Consult your caregiver. ** Family history and personal history of risk and conditions may change your caregiver's recommendations. Document Released: 10/05/2001 Document Revised: 12/04/2012 Document Reviewed: 01/04/2011 Exit[employer] Patient Information 2014 [employer].  

## 2013-07-10 LAB — POCT URINALYSIS DIPSTICK
Bilirubin, UA: NEGATIVE
Blood, UA: NEGATIVE
Ketones, UA: NEGATIVE
Leukocytes, UA: NEGATIVE
Nitrite, UA: NEGATIVE
Protein, UA: NEGATIVE
Urobilinogen, UA: 0.2
pH, UA: 6.5

## 2013-07-10 LAB — MICROALBUMIN / CREATININE URINE RATIO
Microalb Creat Ratio: 2.4 mg/g (ref 0.0–30.0)
Microalb, Ur: 2.4 mg/dL — ABNORMAL HIGH (ref 0.0–1.9)

## 2013-07-12 ENCOUNTER — Other Ambulatory Visit: Payer: Self-pay | Admitting: Emergency Medicine

## 2013-08-08 ENCOUNTER — Ambulatory Visit (INDEPENDENT_AMBULATORY_CARE_PROVIDER_SITE_OTHER): Payer: Medicare HMO | Admitting: Pharmacist

## 2013-08-08 DIAGNOSIS — I480 Paroxysmal atrial fibrillation: Secondary | ICD-10-CM

## 2013-08-08 DIAGNOSIS — D735 Infarction of spleen: Secondary | ICD-10-CM

## 2013-08-08 DIAGNOSIS — D7389 Other diseases of spleen: Secondary | ICD-10-CM

## 2013-08-08 DIAGNOSIS — Z7901 Long term (current) use of anticoagulants: Secondary | ICD-10-CM

## 2013-08-08 DIAGNOSIS — I4891 Unspecified atrial fibrillation: Secondary | ICD-10-CM

## 2013-08-08 LAB — POCT INR: INR: 2.2

## 2013-08-22 ENCOUNTER — Other Ambulatory Visit: Payer: Self-pay | Admitting: Family Medicine

## 2013-08-22 ENCOUNTER — Ambulatory Visit
Admission: RE | Admit: 2013-08-22 | Discharge: 2013-08-22 | Disposition: A | Discharge status: Home / Self Care | Payer: Commercial Managed Care - HMO | Source: Ambulatory Visit | Attending: Family Medicine | Admitting: Family Medicine

## 2013-08-22 DIAGNOSIS — E2839 Other primary ovarian failure: Secondary | ICD-10-CM

## 2013-09-14 ENCOUNTER — Encounter: Payer: Medicare HMO | Admitting: *Deleted

## 2013-09-18 ENCOUNTER — Encounter: Payer: Self-pay | Admitting: *Deleted

## 2013-09-19 ENCOUNTER — Ambulatory Visit (INDEPENDENT_AMBULATORY_CARE_PROVIDER_SITE_OTHER): Payer: Commercial Managed Care - HMO | Admitting: *Deleted

## 2013-09-19 ENCOUNTER — Ambulatory Visit (INDEPENDENT_AMBULATORY_CARE_PROVIDER_SITE_OTHER): Payer: Medicare HMO | Admitting: *Deleted

## 2013-09-19 DIAGNOSIS — D7389 Other diseases of spleen: Secondary | ICD-10-CM | POA: Diagnosis not present

## 2013-09-19 DIAGNOSIS — D735 Infarction of spleen: Secondary | ICD-10-CM

## 2013-09-19 DIAGNOSIS — I4891 Unspecified atrial fibrillation: Secondary | ICD-10-CM | POA: Diagnosis not present

## 2013-09-19 DIAGNOSIS — Z7901 Long term (current) use of anticoagulants: Secondary | ICD-10-CM | POA: Diagnosis not present

## 2013-09-19 DIAGNOSIS — I428 Other cardiomyopathies: Secondary | ICD-10-CM

## 2013-09-19 DIAGNOSIS — I480 Paroxysmal atrial fibrillation: Secondary | ICD-10-CM

## 2013-09-19 DIAGNOSIS — Z5181 Encounter for therapeutic drug level monitoring: Secondary | ICD-10-CM | POA: Insufficient documentation

## 2013-09-19 LAB — MDC_IDC_ENUM_SESS_TYPE_REMOTE
Battery Impedance: 1537 Ohm
Battery Remaining Longevity: 34 mo
Battery Voltage: 2.75 V
Brady Statistic RV Percent Paced: 92 %
Lead Channel Impedance Value: 67 Ohm
Lead Channel Pacing Threshold Pulse Width: 0.4 ms
Lead Channel Setting Pacing Amplitude: 2.5 V
Lead Channel Setting Pacing Pulse Width: 0.4 ms
MDC IDC MSMT LEADCHNL RV IMPEDANCE VALUE: 463 Ohm
MDC IDC MSMT LEADCHNL RV PACING THRESHOLD AMPLITUDE: 0.875 V
MDC IDC SESS DTM: 20150128163806
MDC IDC SET LEADCHNL RV SENSING SENSITIVITY: 5.6 mV

## 2013-09-19 LAB — POCT INR: INR: 2.3

## 2013-09-27 ENCOUNTER — Other Ambulatory Visit: Payer: Self-pay | Admitting: Family Medicine

## 2013-10-02 ENCOUNTER — Encounter: Payer: Self-pay | Admitting: *Deleted

## 2013-10-02 ENCOUNTER — Other Ambulatory Visit: Payer: Self-pay | Admitting: Family Medicine

## 2013-10-02 NOTE — Telephone Encounter (Signed)
Last seen 07/09/13 and filled 06/05/13 #60 with 3 refills. Please advise     KP

## 2013-10-11 ENCOUNTER — Encounter: Payer: Self-pay | Admitting: Internal Medicine

## 2013-10-11 ENCOUNTER — Other Ambulatory Visit: Payer: Self-pay | Admitting: Family Medicine

## 2013-10-11 ENCOUNTER — Telehealth: Payer: Self-pay | Admitting: Emergency Medicine

## 2013-10-11 NOTE — Telephone Encounter (Signed)
Spoke with pt  She states needing alternative to pulmicort  Med will cost over 100 $ Pt overdue for appt anyway, and so needs to come in to discuss OV scheduled for 10/17/13

## 2013-10-17 ENCOUNTER — Encounter (INDEPENDENT_AMBULATORY_CARE_PROVIDER_SITE_OTHER): Payer: Self-pay

## 2013-10-17 ENCOUNTER — Ambulatory Visit (INDEPENDENT_AMBULATORY_CARE_PROVIDER_SITE_OTHER): Payer: Medicare HMO | Admitting: Emergency Medicine

## 2013-10-17 ENCOUNTER — Encounter: Payer: Self-pay | Admitting: Emergency Medicine

## 2013-10-17 VITALS — BP 122/70 | HR 69 | Ht 59.0 in | Wt 149.6 lb

## 2013-10-17 DIAGNOSIS — J45909 Unspecified asthma, uncomplicated: Secondary | ICD-10-CM

## 2013-10-17 DIAGNOSIS — J310 Chronic rhinitis: Secondary | ICD-10-CM

## 2013-10-17 MED ORDER — FLUTICASONE PROPIONATE 50 MCG/ACT NA SUSP: 2.0000 | Freq: Two times a day (BID) | NASAL | Status: DC

## 2013-10-17 NOTE — Patient Instructions (Signed)
Continue your inhaled medications as you have been taking them  We will change your budesonide to fluticasone 2 sprays each side twice a day  Follow with [name] in 4 months or sooner if you have any problems.

## 2013-10-17 NOTE — Assessment & Plan Note (Signed)
Well controlled on QVAR and Spiriva - continue same and work on allergic rhinitis

## 2013-10-17 NOTE — Progress Notes (Signed)
[name] is a 79 year old woman with a history of mild bronchiectasis and associated mild airflow limitation. She also has allergic rhinitis and postnasal drip. Taking fexofenadine and Rhinocort. Also on Spiriva + QVAR.   ROV 02/21/13 -- bronchiectasis, mild AFL, chronic rhinitis. She is doing [place], loratadine, rhinocort back to 2 sprays. Her breathing has been the same until the weather got hotter. She gets SOB with walking in from parking lot. Occasional cough. She does cough some at night, denies GERD as long as she is on nexium.   ROV 10/17/13 -- bronchiectasis, mild AFL, chronic rhinitis. She has been on QVAR, Spiriva longstanding. She states that her breathing can still be limiting, has been a bit more so in the winter. Her Rhinocort is no longer formulary.   Filed Vitals:   10/17/13 1351  BP: 122/70  Pulse: 69  Height: 4\' 11"  (1.499 m)  Weight: 149 lb 9.6 oz (67.858 kg)  SpO2: 91%   Gen: Pleasant, well-nourished, in no distress,  normal affect  ENT: No lesions,  mouth clear,  oropharynx clear, no postnasal drip  Neck: No JVD, no TMG, no carotid bruits  Lungs: No use of accessory muscles, few insp crackles L base  Cardiovascular: RRR, heart sounds normal, no murmur or gallops, no peripheral edema  Musculoskeletal: No deformities, no cyanosis or clubbing  Neuro: alert, non focal  Skin: Warm, no lesions or rashes   ASTHMA Well controlled on QVAR and Spiriva - continue same and work on allergic rhinitis  RHINITIS - change budesonide to fluticasone nasal spray bid

## 2013-10-17 NOTE — Assessment & Plan Note (Signed)
-   change budesonide to fluticasone nasal spray bid

## 2013-10-31 ENCOUNTER — Ambulatory Visit (INDEPENDENT_AMBULATORY_CARE_PROVIDER_SITE_OTHER): Payer: Medicare HMO | Admitting: *Deleted

## 2013-10-31 DIAGNOSIS — Z7901 Long term (current) use of anticoagulants: Secondary | ICD-10-CM

## 2013-10-31 DIAGNOSIS — D7389 Other diseases of spleen: Secondary | ICD-10-CM

## 2013-10-31 DIAGNOSIS — D735 Infarction of spleen: Secondary | ICD-10-CM

## 2013-10-31 DIAGNOSIS — Z5181 Encounter for therapeutic drug level monitoring: Secondary | ICD-10-CM

## 2013-10-31 DIAGNOSIS — I4891 Unspecified atrial fibrillation: Secondary | ICD-10-CM

## 2013-10-31 DIAGNOSIS — I480 Paroxysmal atrial fibrillation: Secondary | ICD-10-CM

## 2013-10-31 LAB — POCT INR: INR: 2.3

## 2013-11-01 ENCOUNTER — Other Ambulatory Visit: Payer: Self-pay | Admitting: Emergency Medicine

## 2013-11-04 ENCOUNTER — Other Ambulatory Visit: Payer: Self-pay | Admitting: Emergency Medicine

## 2013-11-08 ENCOUNTER — Encounter: Payer: Self-pay | Admitting: Internal Medicine

## 2013-11-08 ENCOUNTER — Ambulatory Visit (INDEPENDENT_AMBULATORY_CARE_PROVIDER_SITE_OTHER): Payer: Commercial Managed Care - HMO | Admitting: Internal Medicine

## 2013-11-08 VITALS — BP 132/81 | HR 81 | Ht 59.0 in | Wt 149.0 lb

## 2013-11-08 DIAGNOSIS — Z950 Presence of cardiac pacemaker: Secondary | ICD-10-CM

## 2013-11-08 DIAGNOSIS — I495 Sick sinus syndrome: Secondary | ICD-10-CM

## 2013-11-08 LAB — MDC_IDC_ENUM_SESS_TYPE_INCLINIC
Battery Impedance: 1622 Ohm
Battery Remaining Longevity: 32 mo
Battery Voltage: 2.75 V
Brady Statistic RV Percent Paced: 93 %
Lead Channel Pacing Threshold Amplitude: 1 V
Lead Channel Sensing Intrinsic Amplitude: 15.67 mV
Lead Channel Setting Pacing Amplitude: 2.5 V
Lead Channel Setting Pacing Pulse Width: 0.4 ms
Lead Channel Setting Sensing Sensitivity: 5.6 mV
MDC IDC MSMT LEADCHNL RA IMPEDANCE VALUE: 67 Ohm
MDC IDC MSMT LEADCHNL RV IMPEDANCE VALUE: 465 Ohm
MDC IDC MSMT LEADCHNL RV PACING THRESHOLD PULSEWIDTH: 0.4 ms
MDC IDC SESS DTM: 20150319160501

## 2013-11-08 NOTE — Patient Instructions (Signed)
Your physician recommends that you continue on your current medications as directed. Please refer to the Current Medication list given to you today.  Remote monitoring is used to monitor your Pacemaker of ICD from home. This monitoring reduces the number of office visits required to check your device to one time per year. It allows us to keep an eye on the functioning of your device to ensure it is working properly. You are scheduled for a device check from home on 02/11/14. You may send your transmission at any time that day. If you have a wireless device, the transmission will be sent automatically. After your physician reviews your transmission, you will receive a postcard with your next transmission date.  Your physician wants you to follow-up in: 1 year with Dr. Klein.  You will receive a reminder letter in the mail two months in advance. If you don't receive a letter, please call our office to schedule the follow-up appointment.  

## 2013-11-08 NOTE — Progress Notes (Signed)
Patient Care Team: Lelon Perla, DO as PCP - General   HPI  [name] is a 79 y.o. female Is seen in followup with a history of tako-tsubo nonischemic cardiomyopathy in 9/07 which has resolved. She also has a history of atrial flutter and is status post ablation as well as placement of a permanent pacemaker due to profound sinus node dysfunction.    The patient Has had problems over the last 6 months with progressive dyspnea on exertion. It is unaccompanied by chest pain. He has seen pulmonary was treated with inhalers and a diagnosis of bronchiectasis and mild airflow limitation..  .   Past Medical History  Diagnosis Date  . Asthma   . Rhinitis   . Osteoarthritis   . Osteoporosis   . Peptic ulcer disease   . Depression   . Atrial fibrillation     s/p ablation   . Takotsubo cardiomyopathy     resolved  . Shingles   . Eosinophilia   . Cardiomyopathy   . Sinoatrial node dysfunction   . Atrial arrhythmia   . Sinusitis   . Rhinitis   . GERD (gastroesophageal reflux disease)   . Bronchiectasis   . Peptic ulcer disease   . Osteoarthritis   . Herpes zoster   . Hyperlipemia   . Injury of splenic artery     infarct    Past Surgical History  Procedure Laterality Date  . Cholecystectomy    . Tonsillectomy    . Cataract extraction    . Pacemaker insertion  09/29/06  . Cosmetic surgery      Current Outpatient Prescriptions  Medication Sig Dispense Refill  . Ascorbic Acid (VITAMIN C) 1000 MG tablet Take 1,000 mg by mouth daily.        [name] atorvastatin (LIPITOR) 10 MG tablet TAKE 1 TABLET EVERY DAY  90 tablet  0  . BEPREVE 1.5 % SOLN       . budesonide (RHINOCORT AQUA) 32 MCG/ACT nasal spray Place 1 spray into the nose daily.      . Calcium Carbonate (CALCIUM 600) 1500 MG TABS Take 1 tablet by mouth daily.        . Cholecalciferol (VITAMIN D3) 1000 UNITS CAPS Take 1 capsule by mouth daily.        [name] escitalopram (LEXAPRO) 10 MG tablet TAKE 1 TABLET (10 MG TOTAL)  BY MOUTH DAILY.  30 tablet  5  . fluticasone (FLONASE) 50 MCG/ACT nasal spray Place 2 sprays into both nostrils 2 (two) times daily.  16 g  11  . furosemide (LASIX) 20 MG tablet Take 1 tablet (20 mg total) by mouth daily.  30 tablet  12  . loratadine (CLARITIN) 10 MG tablet Take 10 mg by mouth daily.      . Multiple Vitamin (MULTIVITAMIN) tablet Take 1 tablet by mouth daily.        [name] NEXIUM 40 MG capsule TAKE ONE CAPSULE BY MOUTH EVERY DAY BEFORE BREAKFAST  30 capsule  5  . QVAR 80 MCG/ACT inhaler INHALE 2 PUFFS INTO THE LUNGS 2 (TWO) TIMES DAILY.  8.7 g  3  . SPIRIVA HANDIHALER 18 MCG inhalation capsule PLACE 1 CAPSULE (18 MCG TOTAL) INTO INHALER AND INHALE DAILY.  30 capsule  11  . traZODone (DESYREL) 100 MG tablet TAKE 2 TABLETS AT BEDTIME  60 tablet  3  . warfarin (COUMADIN) 2.5 MG tablet TAKE 1 TABLET (2.5 MG TOTAL) BY MOUTH AS DIRECTED.  30 tablet  3  . [DISCONTINUED] fexofenadine (ALLEGRA) 180 MG tablet Take 180 mg by mouth daily.        No current facility-administered medications for this visit.    Allergies  Allergen Reactions  . Benzoyl Peroxide     REACTION: unspecified  . Neomycin-Bacitracin Zn-Polymyx     REACTION: unspecified    Review of Systems negative except from HPI and PMH  Physical Exam BP 132/81  Pulse 81  Ht 4\' 11"  (1.499 m)  Wt 149 lb (67.586 kg)  BMI 30.08 kg/m2 Well developed and nourished in mild respiratory distress HENT normal Neck supple with JVP-f>10 Clear Regular rate and rhythm, no murmurs or gallops Abd-soft with active BS No Clubbing cyanosis tr edema Skin-warm and dry A & Oriented  Grossly normal sensory and motor function   ECG demonstrates atrial fibrillation with ventricular pacing  Assessment and  Plan  Atrial fibrillation-permanent  Dyspnea on exertion  Bronchiectasis  Pacemaker-[employer] The patient's device was interrogated.  The information was reviewed. No changes were made in the programming.     The patient's oxygen  saturation with ambulation today was into the high 80s. Heart rate excursion with reasonable. I don't think there is anything we can do her wrist from a cardiac point of view. I wonder whether he is a candidate for oxygen. she is to see Dr. Delton CoombesByrum next month. I will defer this to him

## 2013-11-12 ENCOUNTER — Encounter: Payer: Self-pay | Admitting: Internal Medicine

## 2013-11-12 ENCOUNTER — Other Ambulatory Visit (INDEPENDENT_AMBULATORY_CARE_PROVIDER_SITE_OTHER): Payer: Medicare HMO

## 2013-11-12 ENCOUNTER — Telehealth: Payer: Self-pay | Admitting: *Deleted

## 2013-11-12 ENCOUNTER — Ambulatory Visit (INDEPENDENT_AMBULATORY_CARE_PROVIDER_SITE_OTHER): Payer: Medicare HMO | Admitting: Internal Medicine

## 2013-11-12 VITALS — BP 112/60 | HR 62 | Temp 97.5°F | Ht 59.0 in | Wt 149.0 lb

## 2013-11-12 DIAGNOSIS — R0989 Other specified symptoms and signs involving the circulatory and respiratory systems: Principal | ICD-10-CM

## 2013-11-12 DIAGNOSIS — R0609 Other forms of dyspnea: Secondary | ICD-10-CM

## 2013-11-12 DIAGNOSIS — R0902 Hypoxemia: Secondary | ICD-10-CM

## 2013-11-12 LAB — CBC WITH DIFFERENTIAL/PLATELET
BASOS PCT: 0.7 % (ref 0.0–3.0)
Basophils Absolute: 0.1 10*3/uL (ref 0.0–0.1)
EOS ABS: 0.2 10*3/uL (ref 0.0–0.7)
EOS PCT: 2.3 % (ref 0.0–5.0)
HCT: 36.1 % (ref 36.0–46.0)
Hemoglobin: 11.8 g/dL — ABNORMAL LOW (ref 12.0–15.0)
LYMPHS PCT: 22.2 % (ref 12.0–46.0)
Lymphs Abs: 2 10*3/uL (ref 0.7–4.0)
MCHC: 32.7 g/dL (ref 30.0–36.0)
MCV: 79.8 fl (ref 78.0–100.0)
Monocytes Absolute: 1 10*3/uL (ref 0.1–1.0)
Monocytes Relative: 11.6 % (ref 3.0–12.0)
NEUTROS PCT: 63.2 % (ref 43.0–77.0)
Neutro Abs: 5.7 10*3/uL (ref 1.4–7.7)
Platelets: 348 10*3/uL (ref 150.0–400.0)
RBC: 4.53 Mil/uL (ref 3.87–5.11)
RDW: 18 % — ABNORMAL HIGH (ref 11.5–14.6)
WBC: 8.9 10*3/uL (ref 4.5–10.5)

## 2013-11-12 LAB — BASIC METABOLIC PANEL
BUN: 14 mg/dL (ref 6–23)
CO2: 25 meq/L (ref 19–32)
Calcium: 9.2 mg/dL (ref 8.4–10.5)
Chloride: 97 mEq/L (ref 96–112)
Creatinine, Ser: 0.7 mg/dL (ref 0.4–1.2)
GFR: 92.43 mL/min (ref >60.00)
Glucose, Bld: 110 mg/dL — ABNORMAL HIGH (ref 70–99)
POTASSIUM: 4.2 meq/L (ref 3.5–5.1)
SODIUM: 128 meq/L — AB (ref 135–145)

## 2013-11-12 LAB — BRAIN NATRIURETIC PEPTIDE: Pro B Natriuretic peptide (BNP): 426 pg/mL — ABNORMAL HIGH (ref 0.0–100.0)

## 2013-11-12 LAB — TSH: TSH: 3.29 u[IU]/mL (ref 0.35–5.50)

## 2013-11-12 NOTE — Assessment & Plan Note (Signed)
11/12/2013  Walked RA  2 laps @ 185 ft each stopped due to  sats 88%  Not clear at all why she's desaturating now and no recent CTa on file so will proceed with CTa looking for occult PE or ILD  No evidence copd in this never smoker > ok to stop spiriva to see what difference if any this makes

## 2013-11-12 NOTE — Progress Notes (Signed)
78-yowf never smoker  with a history of mild bronchiectasis and associated mild airflow limitation. She also has allergic rhinitis and postnasal drip. Taking fexofenadine and Rhinocort. Also on Spiriva + QVAR.   ROV 02/21/13 -- bronchiectasis, mild AFL, chronic rhinitis. She is [place], loratad[place], rhinocort back to 2 sprays. Her breathing has been the same until the weather got hotter. She gets SOB with walking in from parking lot. Occasional cough. She does cough some at night, denies GERD as long as she is on nexium.   ROV 10/17/13 -- bronchiectasis, mild AFL, chronic rhinitis. She has been on QVAR, Spiriva longstanding. She states that her breathing can still be limiting, has been a bit more so in the winter. Her Rhinocort is no longer formulary.  rec Continue your inhaled medications as you have been taking them  We will change your budesonide to fluticasone 2 sprays each side twice a day    11/12/2013 acute  ov/Laurice Kimmons re: unexplained sob and now desats with activity  Chief Complaint  Patient presents with  . Acute Visit    Pt c/o increased DOE for the past several months. She gets SOB walking approx 20 yards. She has occ dry cough   uses hc parking, gets let out at door, very sedentary   Has to sit at the top of steps x one year and not going out except to the doctor  Does cough lying down but no excess mucus  Denies any recent increase sob   No obvious day to day or daytime variabilty or assoc  cp or chest tightness, subjective wheeze overt sinus or hb symptoms. No unusual exp hx or h/o childhood pna/ asthma or knowledge of premature birth.  Sleeping ok without nocturnal  or early am exacerbation  of respiratory  c/o's or need for noct saba. Also denies any obvious fluctuation of symptoms with weather or environmental changes or other aggravating or alleviating factors except as outlined above   Current Medications, Allergies, Complete Past Medical History, Past Surgical History, Family  History, and Social History were reviewed in [facility] electronic medical record.  ROS  The following are not active complaints unless bolded sore throat, dysphagia, dental problems, itching, sneezing,  nasal congestion or excess/ purulent secretions, ear ache,   fever, chills, sweats, unintended wt loss, pleuritic or exertional cp, hemoptysis,  orthopnea pnd or leg swelling, presyncope, palpitations, heartburn, abdominal pain, anorexia, nausea, vomiting, diarrhea  or change in bowel or urinary habits, change in stools or urine, dysuria,hematuria,  rash, arthralgias, visual complaints, headache, numbness weakness or ataxia or problems with walking or coordination,  change in mood/affect or memory.        Wt Readings from Last 3 Encounters:  11/12/13 149 lb (67.586 kg)  11/08/13 149 lb (67.586 kg)  10/17/13 149 lb 9.6 oz (67.858 kg)       Gen: Pleasant, well-nourished, in no distress,  normal affect  ENT: No lesions,  mouth clear,  oropharynx clear, no postnasal drip  Neck: No JVD, no TMG, no carotid bruits  Lungs: No use of accessory muscles, no crackles or exp wheeze/ mod kyphosis   Cardiovascular: RRR, heart sounds normal, no murmur or gallops, no peripheral edema  Musculoskeletal: No deformities, no cyanosis or clubbing  Neuro: alert, non focal  Skin: Warm, no lesions or rashes     No recent cxr on file    11/12/2013 labs for sob  Recent Labs Lab 11/12/13 1449  NA 128*  K 4.2  CL  97  CO2 25  BUN 14  CREATININE 0.7  GLUCOSE 110*    Recent Labs Lab 11/12/13 1449  HGB 11.8*  HCT 36.1  WBC 8.9  PLT 348.0     Lab Results  Component Value Date   TSH 3.29 11/12/2013    Lab Results  Component Value Date   PROBNP 426.0* 11/12/2013

## 2013-11-12 NOTE — Assessment & Plan Note (Signed)
Based on her presently described activity level there is no evidence at all that 02 will help her do more than she is presently attempting = room to room only, nothing more aerobic.  Should she desire a more aerobic level of activity then would benefit from 02 with ex.

## 2013-11-12 NOTE — Telephone Encounter (Signed)
Message copied by Christen Butter on Mon Nov 12, 2013  5:49 PM ------      Message from: Sandrea Hughs B      Created: Mon Nov 12, 2013  5:42 PM       Let her know we're ordering CTa ------

## 2013-11-12 NOTE — Patient Instructions (Addendum)
Please remember to go to the lab  department downstairs for your tests - we will call you with the results when they are available.  Try stopping spiriva to see what difference if any that makes   Pace yourself at a slower rate when you go out  Please schedule a follow up office visit in 2 weeks, sooner if needed to see Dr Delton Coombes on return  Late add : ordered CTa

## 2013-11-12 NOTE — Progress Notes (Signed)
Quick Note:  LMTCB ______ 

## 2013-11-12 NOTE — Telephone Encounter (Signed)
LMTCB

## 2013-11-13 ENCOUNTER — Ambulatory Visit (INDEPENDENT_AMBULATORY_CARE_PROVIDER_SITE_OTHER)
Admission: RE | Admit: 2013-11-13 | Discharge: 2013-11-13 | Disposition: A | Discharge status: Home / Self Care | Payer: Medicare HMO | Source: Ambulatory Visit | Attending: Internal Medicine | Admitting: Internal Medicine

## 2013-11-13 ENCOUNTER — Inpatient Hospital Stay: Admission: RE | Admit: 2013-11-13 | Payer: Medicare HMO | Source: Ambulatory Visit

## 2013-11-13 ENCOUNTER — Encounter: Payer: Self-pay | Admitting: Internal Medicine

## 2013-11-13 DIAGNOSIS — R0609 Other forms of dyspnea: Secondary | ICD-10-CM

## 2013-11-13 DIAGNOSIS — R0989 Other specified symptoms and signs involving the circulatory and respiratory systems: Secondary | ICD-10-CM

## 2013-11-13 MED ORDER — IOHEXOL 300 MG/ML  SOLN
80.0000 mL | Freq: Once | INTRAMUSCULAR | Status: AC | PRN
  Administered 2013-11-13: 80 mL via INTRAVENOUS

## 2013-11-13 NOTE — Progress Notes (Signed)
Quick Note:  LMTCB ______ 

## 2013-11-13 NOTE — Telephone Encounter (Signed)
Spoke with the pt and notified cta scheduled for 4 pm at [facility] 2 hrs prior

## 2013-11-14 ENCOUNTER — Telehealth: Payer: Self-pay | Admitting: Internal Medicine

## 2013-11-14 ENCOUNTER — Other Ambulatory Visit: Payer: Medicare HMO

## 2013-11-14 NOTE — Progress Notes (Signed)
Quick Note:  Spoke with pt and notified of results per Dr. Wert. Pt verbalized understanding and denied any questions.  ______ 

## 2013-11-14 NOTE — Telephone Encounter (Signed)
Spoke with the pt and notified of results per MW  She verbalized understanding  Nothing further needed 

## 2013-11-15 ENCOUNTER — Encounter: Payer: Self-pay | Admitting: Internal Medicine

## 2013-11-29 ENCOUNTER — Ambulatory Visit (INDEPENDENT_AMBULATORY_CARE_PROVIDER_SITE_OTHER): Payer: Medicare HMO | Admitting: Emergency Medicine

## 2013-11-29 ENCOUNTER — Encounter: Payer: Self-pay | Admitting: Emergency Medicine

## 2013-11-29 VITALS — BP 126/62 | HR 63 | Temp 97.0°F | Ht 59.0 in | Wt 150.2 lb

## 2013-11-29 DIAGNOSIS — R0609 Other forms of dyspnea: Secondary | ICD-10-CM

## 2013-11-29 DIAGNOSIS — R0989 Other specified symptoms and signs involving the circulatory and respiratory systems: Secondary | ICD-10-CM

## 2013-11-29 DIAGNOSIS — J479 Bronchiectasis, uncomplicated: Secondary | ICD-10-CM

## 2013-11-29 MED ORDER — ALBUTEROL SULFATE HFA 108 (90 BASE) MCG/ACT IN AERS: 2.0000 | INHALATION_SPRAY | RESPIRATORY_TRACT | Status: DC | PRN

## 2013-11-29 NOTE — Patient Instructions (Signed)
Please restart your Spiriva daily Continue your QVAR as you have been taking it We will start ProAir (albuterol) 2 puffs up to every four hours if needed for shortness of breath.  Follow with Dr Delton Coombes in 2 months or sooner if you have any problems.

## 2013-11-29 NOTE — Assessment & Plan Note (Signed)
She has always described an effect of spiriva that seemed out of proportion to her PFT's. She indicates that she has missed it and wants to restart. Will continue QVAR, add albuterol prn.

## 2013-11-29 NOTE — Progress Notes (Signed)
[name] is a 79 year old woman with a history of mild bronchiectasis and associated mild airflow limitation. She also has allergic rhinitis and postnasal drip. Taking fexofenadine and Rhinocort. Also on Spiriva + QVAR.   ROV 02/21/13 -- bronchiectasis, mild AFL, chronic rhinitis. She is doing [place], loratadine, rhinocort back to 2 sprays. Her breathing has been the same until the weather got hotter. She gets SOB with walking in from parking lot. Occasional cough. She does cough some at night, denies GERD as long as she is on nexium.   ROV 10/17/13 -- bronchiectasis, mild AFL, chronic rhinitis. She has been on QVAR, Spiriva longstanding. She states that her breathing can still be limiting, has been a bit more so in the winter. Her Rhinocort is no longer formulary.   ROV 11/29/13 -- bronchiectasis, mild AFL, chronic rhinitis. Was seen by Dr Sherene Sires in March, found to desaturate with walking. She has been on Spiriva for years despite no tobacco history and only mild AFL. She agreed to trial stopping it at last visit.  She says that she missed it > is more dyspneic without it. She is having exertional SOB and nocturnal cough. CT scan from last visit showed no PE, no ILD.   Filed Vitals:   11/29/13 1638  BP: 126/62  Pulse: 63  Temp: 97 F (36.1 C)  TempSrc: Oral  Height: 4\' 11"  (1.499 m)  Weight: 150 lb 3.2 oz (68.13 kg)  SpO2: 93%   Gen: Pleasant, well-nourished, in no distress,  normal affect  ENT: No lesions,  mouth clear,  oropharynx clear, no postnasal drip  Neck: No JVD, no TMG, no carotid bruits  Lungs: No use of accessory muscles, few insp crackles L base  Cardiovascular: RRR, heart sounds normal, no murmur or gallops, no peripheral edema  Musculoskeletal: No deformities, no cyanosis or clubbing  Neuro: alert, non focal  Skin: Warm, no lesions or rashes   11/12/13 --  COMPARISON: CT ANGIO CHEST W/CM &/OR WO/CM dated 07/12/2011  FINDINGS:  Lungs/Pleura: Night No lobar consolidation.  Subsegmental atelectasis  in the lingula and left lower lung  High-resolution images demonstrate mild motion degradation,  especially inferiorly. Given this factor, no evidence of  interstitial lung disease. Expiratory images demonstrate minimal  nonspecific air trapping at the bases.  No pleural fluid.  Heart/Mediastinum: The quality of this examination for evaluation of  pulmonary embolism is good. No evidence of pulmonary embolism.  Aortic and branch vessel atherosclerosis. Mild cardiomegaly.  Multivessel coronary artery atherosclerosis. Multiple small middle  mediastinal nodes. A precarinal node measures 1.4 cm on image 29.  Similar to the prior exam. Mildly prominent but not pathologically  sized hilar nodal tissue bilaterally.  Upper Abdomen: No significant findings.  Bones/Musculoskeletal: Osteopenia.  Review of the MIP images confirms the above findings.  IMPRESSION:  1. No evidence of pulmonary embolism.  2. Cardiomegaly with multivessel coronary artery atherosclerosis.  3. Although the high-resolution images are motion degraded, no  evidence of interstitial lung disease is identified.  4. Mild mediastinal adenopathy with prominent but not pathologically  enlarged hilar nodal tissue bilaterally. Given stability since  07/12/2011, reactive etiologies are favored   BRONCHIECTASIS She has always described an effect of spiriva that seemed out of proportion to her PFT's. She indicates that she has missed it and wants to restart. Will continue QVAR, add albuterol prn.   DYSPNEA ON EXERTION No PE or ILD or CT scan 11/12/13.  Suspect a component of her dyspnea is deconditioning, although unclear and  she definitely has some AFL. Repeat walking oximetry today > she stopped after 1 lap, SpO2 89%

## 2013-11-29 NOTE — Assessment & Plan Note (Signed)
No PE or ILD or CT scan 11/12/13.  Suspect a component of her dyspnea is deconditioning, although unclear and she definitely has some AFL. Repeat walking oximetry today > she stopped after 1 lap, SpO2 89%

## 2013-12-12 ENCOUNTER — Ambulatory Visit (INDEPENDENT_AMBULATORY_CARE_PROVIDER_SITE_OTHER): Payer: Medicare HMO | Admitting: *Deleted

## 2013-12-12 DIAGNOSIS — D7389 Other diseases of spleen: Secondary | ICD-10-CM

## 2013-12-12 DIAGNOSIS — I480 Paroxysmal atrial fibrillation: Secondary | ICD-10-CM

## 2013-12-12 DIAGNOSIS — Z7901 Long term (current) use of anticoagulants: Secondary | ICD-10-CM

## 2013-12-12 DIAGNOSIS — Z5181 Encounter for therapeutic drug level monitoring: Secondary | ICD-10-CM | POA: Diagnosis not present

## 2013-12-12 DIAGNOSIS — D735 Infarction of spleen: Secondary | ICD-10-CM

## 2013-12-12 DIAGNOSIS — I4891 Unspecified atrial fibrillation: Secondary | ICD-10-CM | POA: Diagnosis not present

## 2013-12-12 LAB — POCT INR: INR: 2.5

## 2013-12-24 ENCOUNTER — Other Ambulatory Visit: Payer: Self-pay | Admitting: Internal Medicine

## 2014-01-04 ENCOUNTER — Other Ambulatory Visit: Payer: Self-pay | Admitting: Family Medicine

## 2014-01-04 NOTE — Telephone Encounter (Signed)
Rx sent to the pharmacy by e-script.//AB/CMA 

## 2014-01-08 ENCOUNTER — Other Ambulatory Visit: Payer: Self-pay | Admitting: Family Medicine

## 2014-01-23 ENCOUNTER — Ambulatory Visit (INDEPENDENT_AMBULATORY_CARE_PROVIDER_SITE_OTHER): Payer: Commercial Managed Care - HMO | Admitting: Emergency Medicine

## 2014-01-23 ENCOUNTER — Ambulatory Visit (INDEPENDENT_AMBULATORY_CARE_PROVIDER_SITE_OTHER): Payer: Commercial Managed Care - HMO | Admitting: Surgery

## 2014-01-23 ENCOUNTER — Encounter: Payer: Self-pay | Admitting: Emergency Medicine

## 2014-01-23 VITALS — BP 128/80 | HR 67 | Ht 59.0 in | Wt 148.6 lb

## 2014-01-23 DIAGNOSIS — I480 Paroxysmal atrial fibrillation: Secondary | ICD-10-CM

## 2014-01-23 DIAGNOSIS — D7389 Other diseases of spleen: Secondary | ICD-10-CM

## 2014-01-23 DIAGNOSIS — D735 Infarction of spleen: Secondary | ICD-10-CM

## 2014-01-23 DIAGNOSIS — I4891 Unspecified atrial fibrillation: Secondary | ICD-10-CM

## 2014-01-23 DIAGNOSIS — J310 Chronic rhinitis: Secondary | ICD-10-CM

## 2014-01-23 DIAGNOSIS — Z7901 Long term (current) use of anticoagulants: Secondary | ICD-10-CM

## 2014-01-23 DIAGNOSIS — J45909 Unspecified asthma, uncomplicated: Secondary | ICD-10-CM

## 2014-01-23 DIAGNOSIS — Z5181 Encounter for therapeutic drug level monitoring: Secondary | ICD-10-CM

## 2014-01-23 LAB — POCT INR: INR: 2.1

## 2014-01-23 MED ORDER — BUDESONIDE 32 MCG/ACT NA SUSP: 1.0000 | Freq: Every day | NASAL | Status: DC

## 2014-01-23 MED ORDER — BUDESONIDE-FORMOTEROL FUMARATE 160-4.5 MCG/ACT IN AERO
2.0000 | INHALATION_SPRAY | Freq: Two times a day (BID) | RESPIRATORY_TRACT | Status: DC
Start: 2014-01-23 — End: 2014-04-22

## 2014-01-23 NOTE — Patient Instructions (Signed)
Continue loratadine daily Please restart Rhinocort 2 sprays each side daily. We will get prior authorization for this medication  Continue spiriva daily Stop QVAR Start Symbicort 2 puffs twice a day. Rinse your mouth after using.  Use albuterol as needed  Follow with Dr Delton Coombes in 3 months or sooner if you have any problems.

## 2014-01-23 NOTE — Assessment & Plan Note (Signed)
The etiology of her increased dyspnea is likely multifactorial. Her response to spiriva has always been much more impressive than her spirometry. I would like to try changing qvar to symbicort 80 to see if she benefits. Also need to get her back on her nasal steroid. Follow in 3 months to see if she has benefited.

## 2014-01-23 NOTE — Assessment & Plan Note (Signed)
Restart rhinocort - she has had epistaxis with the fluticasone. Will need to get a prior auth, indicate that she has failed the fluticasone

## 2014-01-23 NOTE — Progress Notes (Signed)
[name] is a 79 year old woman with a history of mild bronchiectasis and associated mild airflow limitation. She also has allergic rhinitis and postnasal drip. Taking fexofenadine and Rhinocort. Also on Spiriva + QVAR.   ROV 02/21/13 -- bronchiectasis, mild AFL, chronic rhinitis. She is doing [place], loratadine, rhinocort back to 2 sprays. Her breathing has been the same until the weather got hotter. She gets SOB with walking in from parking lot. Occasional cough. She does cough some at night, denies GERD as long as she is on nexium.   ROV 10/17/13 -- bronchiectasis, mild AFL, chronic rhinitis. She has been on QVAR, Spiriva longstanding. She states that her breathing can still be limiting, has been a bit more so in the winter. Her Rhinocort is no longer formulary.   ROV 11/29/13 -- bronchiectasis, mild AFL, chronic rhinitis. Was seen by Dr Sherene SiresWert in March, found to desaturate with walking. She has been on Spiriva for years despite no tobacco history and only mild AFL. She agreed to trial stopping it at last visit.  She says that she missed it > is more dyspneic without it. She is having exertional SOB and nocturnal cough. CT scan from last visit showed no PE, no ILD.   ROV 01/23/14 -- bronchiectasis, mild AFL, chronic rhinitis, newly dx exertional hypoxemia. She is on spiriva, loratadine, QVAR. She also has ProAir, she uses it but doesn't seem to benefit. Returns today with continued DOE. She is not on the fluticasone right now.   Filed Vitals:   01/23/14 1510  BP: 128/80  Pulse: 67  Height: 4\' 11"  (1.499 m)  Weight: 148 lb 9.6 oz (67.405 kg)  SpO2: 92%   Gen: Pleasant, well-nourished, in no distress,  normal affect  ENT: No lesions,  mouth clear,  oropharynx clear, no postnasal drip  Neck: No JVD, no TMG, no carotid bruits  Lungs: No use of accessory muscles, few insp crackles L base  Cardiovascular: RRR, heart sounds normal, no murmur or gallops, no peripheral edema  Musculoskeletal: No  deformities, no cyanosis or clubbing  Neuro: alert, non focal  Skin: Warm, no lesions or rashes   11/12/13 --  COMPARISON: CT ANGIO CHEST W/CM &/OR WO/CM dated 07/12/2011  FINDINGS:  Lungs/Pleura: Night No lobar consolidation. Subsegmental atelectasis  in the lingula and left lower lung  High-resolution images demonstrate mild motion degradation,  especially inferiorly. Given this factor, no evidence of  interstitial lung disease. Expiratory images demonstrate minimal  nonspecific air trapping at the bases.  No pleural fluid.  Heart/Mediastinum: The quality of this examination for evaluation of  pulmonary embolism is good. No evidence of pulmonary embolism.  Aortic and branch vessel atherosclerosis. Mild cardiomegaly.  Multivessel coronary artery atherosclerosis. Multiple small middle  mediastinal nodes. A precarinal node measures 1.4 cm on image 29.  Similar to the prior exam. Mildly prominent but not pathologically  sized hilar nodal tissue bilaterally.  Upper Abdomen: No significant findings.  Bones/Musculoskeletal: Osteopenia.  Review of the MIP images confirms the above findings.  IMPRESSION:  1. No evidence of pulmonary embolism.  2. Cardiomegaly with multivessel coronary artery atherosclerosis.  3. Although the high-resolution images are motion degraded, no  evidence of interstitial lung disease is identified.  4. Mild mediastinal adenopathy with prominent but not pathologically  enlarged hilar nodal tissue bilaterally. Given stability since  07/12/2011, reactive etiologies are favored   ASTHMA The etiology of her increased dyspnea is likely multifactorial. Her response to spiriva has always been much more impressive than her spirometry.  I would like to try changing qvar to symbicort 80 to see if she benefits. Also need to get her back on her nasal steroid. Follow in 3 months to see if she has benefited.   RHINITIS Restart rhinocort - she has had epistaxis with the  fluticasone. Will need to get a prior auth, indicate that she has failed the fluticasone

## 2014-02-06 ENCOUNTER — Other Ambulatory Visit: Payer: Self-pay | Admitting: Family Medicine

## 2014-02-06 NOTE — Telephone Encounter (Signed)
Last seen 07/09/13 and filled 10/02/13 #60 with 3 refills. Please advise     KP

## 2014-02-11 ENCOUNTER — Telehealth: Payer: Self-pay | Admitting: Cardiology

## 2014-02-11 ENCOUNTER — Other Ambulatory Visit: Payer: Self-pay | Admitting: Internal Medicine

## 2014-02-11 ENCOUNTER — Ambulatory Visit (INDEPENDENT_AMBULATORY_CARE_PROVIDER_SITE_OTHER): Payer: Medicare HMO | Admitting: *Deleted

## 2014-02-11 DIAGNOSIS — I495 Sick sinus syndrome: Secondary | ICD-10-CM

## 2014-02-11 DIAGNOSIS — Z950 Presence of cardiac pacemaker: Secondary | ICD-10-CM

## 2014-02-11 NOTE — Telephone Encounter (Signed)
Spoke with pt and reminded pt of remote transmission that is due today. Pt verbalized understanding.   

## 2014-02-11 NOTE — Progress Notes (Signed)
Remote pacemaker transmission.   

## 2014-02-18 ENCOUNTER — Encounter: Payer: Self-pay | Admitting: *Deleted

## 2014-02-18 LAB — MDC_IDC_ENUM_SESS_TYPE_REMOTE
Battery Impedance: 1801 Ohm
Battery Remaining Longevity: 29 mo
Battery Voltage: 2.74 V
Brady Statistic RV Percent Paced: 97 %
Date Time Interrogation Session: 20150622173250
Lead Channel Impedance Value: 67 Ohm
Lead Channel Pacing Threshold Amplitude: 1 V
Lead Channel Pacing Threshold Pulse Width: 0.4 ms
Lead Channel Setting Pacing Amplitude: 2.5 V
Lead Channel Setting Sensing Sensitivity: 4 mV
MDC IDC MSMT LEADCHNL RV IMPEDANCE VALUE: 444 Ohm
MDC IDC SET LEADCHNL RV PACING PULSEWIDTH: 0.4 ms

## 2014-02-20 ENCOUNTER — Other Ambulatory Visit: Payer: Self-pay | Admitting: Family Medicine

## 2014-02-26 ENCOUNTER — Encounter: Payer: Self-pay | Admitting: Cardiology

## 2014-03-05 ENCOUNTER — Encounter: Payer: Self-pay | Admitting: Internal Medicine

## 2014-03-06 ENCOUNTER — Ambulatory Visit (INDEPENDENT_AMBULATORY_CARE_PROVIDER_SITE_OTHER): Payer: Commercial Managed Care - HMO | Admitting: *Deleted

## 2014-03-06 DIAGNOSIS — D7389 Other diseases of spleen: Secondary | ICD-10-CM

## 2014-03-06 DIAGNOSIS — Z7901 Long term (current) use of anticoagulants: Secondary | ICD-10-CM

## 2014-03-06 DIAGNOSIS — D735 Infarction of spleen: Secondary | ICD-10-CM

## 2014-03-06 DIAGNOSIS — I480 Paroxysmal atrial fibrillation: Secondary | ICD-10-CM

## 2014-03-06 DIAGNOSIS — I4891 Unspecified atrial fibrillation: Secondary | ICD-10-CM

## 2014-03-06 DIAGNOSIS — Z5181 Encounter for therapeutic drug level monitoring: Secondary | ICD-10-CM

## 2014-03-06 LAB — POCT INR: INR: 2.7

## 2014-03-19 ENCOUNTER — Other Ambulatory Visit: Payer: Self-pay | Admitting: Family Medicine

## 2014-04-01 ENCOUNTER — Ambulatory Visit (INDEPENDENT_AMBULATORY_CARE_PROVIDER_SITE_OTHER): Payer: Commercial Managed Care - HMO | Admitting: Family Medicine

## 2014-04-01 ENCOUNTER — Encounter: Payer: Self-pay | Admitting: Family Medicine

## 2014-04-01 VITALS — BP 116/60 | HR 70 | Temp 97.0°F | Wt 143.0 lb

## 2014-04-01 DIAGNOSIS — R739 Hyperglycemia, unspecified: Secondary | ICD-10-CM

## 2014-04-01 DIAGNOSIS — E785 Hyperlipidemia, unspecified: Secondary | ICD-10-CM

## 2014-04-01 DIAGNOSIS — F411 Generalized anxiety disorder: Secondary | ICD-10-CM

## 2014-04-01 DIAGNOSIS — R7309 Other abnormal glucose: Secondary | ICD-10-CM

## 2014-04-01 MED ORDER — ESCITALOPRAM OXALATE 10 MG PO TABS: ORAL_TABLET | ORAL | Status: DC

## 2014-04-01 NOTE — Progress Notes (Signed)
Subjective:    Patient here for follow-up of elevated blood pressure.  She is not exercising and is adherent to a low-salt diet.  Blood pressure is well controlled at home. Cardiac symptoms: none. Patient denies: chest pain, chest pressure/discomfort, claudication, exertional chest pressure/discomfort, fatigue, irregular heart beat, lower extremity edema, near-syncope, palpitations, paroxysmal nocturnal dyspnea, syncope and tachypnea. Cardiovascular risk factors: advanced age (older than 50 for men, 42 for women), dyslipidemia, hypertension and sedentary lifestyle. Use of agents associated with hypertension: none. History of target organ damage: none.  The following portions of the patient's history were reviewed and updated as appropriate:  She  has a past medical history of Asthma; Rhinitis; Osteoarthritis; Osteoporosis; Peptic ulcer disease; Depression; Atrial fibrillation; Takotsubo cardiomyopathy; Shingles; Eosinophilia; Sinoatrial node dysfunction; GERD (gastroesophageal reflux disease); Bronchiectasis; Peptic ulcer disease; Osteoarthritis; Herpes zoster; Hyperlipemia; and Injury of splenic artery. She  does not have any pertinent problems on file. She  has past surgical history that includes Cholecystectomy; Tonsillectomy; Cataract extraction; Pacemaker insertion (09/29/06); and Cosmetic surgery. Her family history includes Asthma in her mother; Coronary artery disease in an other family member; Depression in her father; Emphysema in her sister; Liver cancer in her son. She  reports that she has never smoked. She has never used smokeless tobacco. She reports that she does not drink alcohol or use illicit drugs. She has a current medication list which includes the following prescription(s): albuterol, vitamin c, atorvastatin, bepreve, budesonide, budesonide-formoterol, calcium carbonate, vitamin d3, escitalopram, fluticasone, furosemide, loratadine, multivitamin, nexium, qvar, tiotropium, trazodone,  and warfarin. Current Outpatient Prescriptions on File Prior to Visit  Medication Sig Dispense Refill  . albuterol (PROAIR HFA) 108 (90 BASE) MCG/ACT inhaler Inhale 2 puffs into the lungs every 4 (four) hours as needed for wheezing or shortness of breath.  1 Inhaler  11  . Ascorbic Acid (VITAMIN C) 1000 MG tablet Take 1,000 mg by mouth daily.        [name] atorvastatin (LIPITOR) 10 MG tablet TAKE 1 TABLET EVERY DAY  90 tablet  1  . BEPREVE 1.5 % SOLN       . budesonide (RHINOCORT AQUA) 32 MCG/ACT nasal spray Place 1 spray into both nostrils daily.  1 Bottle  6  . budesonide-formoterol (SYMBICORT) 160-4.5 MCG/ACT inhaler Inhale 2 puffs into the lungs 2 (two) times daily.  1 Inhaler  6  . Calcium Carbonate (CALCIUM 600) 1500 MG TABS Take 1 tablet by mouth daily.        . Cholecalciferol (VITAMIN D3) 1000 UNITS CAPS Take 1 capsule by mouth daily.        . fluticasone (FLONASE) 50 MCG/ACT nasal spray Place 2 sprays into both nostrils 2 (two) times daily.  16 g  11  . furosemide (LASIX) 20 MG tablet Take 1 tablet (20 mg total) by mouth daily.  30 tablet  12  . loratadine (CLARITIN) 10 MG tablet Take 10 mg by mouth daily.      . Multiple Vitamin (MULTIVITAMIN) tablet Take 1 tablet by mouth daily.        [name] NEXIUM 40 MG capsule TAKE ONE CAPSULE BY MOUTH EVERY DAY BEFORE BREAKFAST  30 capsule  5  . QVAR 80 MCG/ACT inhaler INHALE 2 PUFFS INTO THE LUNGS 2 (TWO) TIMES DAILY.  8.7 g  3  . tiotropium (SPIRIVA) 18 MCG inhalation capsule Place 18 mcg into inhaler and inhale daily.      . traZODone (DESYREL) 100 MG tablet TAKE 2 TABLETS AT BEDTIME  60 tablet  3  . warfarin (COUMADIN) 2.5 MG tablet 1/2 pill everyday except 1 pill on Sundays, Tuesdays and Thursdays or as directed by coumadin clinic  30 tablet  3  . [DISCONTINUED] fexofenadine (ALLEGRA) 180 MG tablet Take 180 mg by mouth daily.        No current facility-administered medications on file prior to visit.   She is allergic to benzoyl peroxide and  neomycin-bacitracin zn-polymyx..  Review of Systems As above    Objective:    BP 116/60  Pulse 70  Temp(Src) 97 F (36.1 C) (Oral)  Wt 143 lb (64.864 kg)  SpO2 93% General appearance: alert, cooperative, appears stated age and no distress Neck: no adenopathy, no carotid bruit, no JVD, supple, symmetrical, trachea midline and thyroid not enlarged, symmetric, no tenderness/mass/nodules Lungs: clear to auscultation bilaterally Heart: S1, S2 normal Extremities: extremities normal, atraumatic, no cyanosis or edema    Assessment:    Hypertension, normal blood pressure . [name].    Plan:    Medication: no change. Check blood pressures 2-3 times weekly and record. Follow up: 6 months and as needed.   1. Generalized anxiety disorder Refill,  Symptoms stable - escitalopram (LEXAPRO) 10 MG tablet; TAKE 1 TABLET BY MOUTH DAILY--  Dispense: 90 tablet; Refill: 3  2. Other and unspecified hyperlipidemia Check labs - Basic metabolic panel; Future - CBC with Differential; Future - Hepatic function panel; Future - Hemoglobin A1c; Future - Lipid panel; Future - POCT urinalysis dipstick; Future  3. Hyperglycemia Check labs - Hemoglobin A1c; Future

## 2014-04-01 NOTE — Patient Instructions (Signed)

## 2014-04-01 NOTE — Progress Notes (Signed)
Pre visit review using our clinic review tool, if applicable. No additional management support is needed unless otherwise documented below in the visit note. 

## 2014-04-02 ENCOUNTER — Other Ambulatory Visit (INDEPENDENT_AMBULATORY_CARE_PROVIDER_SITE_OTHER): Payer: Commercial Managed Care - HMO

## 2014-04-02 DIAGNOSIS — R7309 Other abnormal glucose: Secondary | ICD-10-CM

## 2014-04-02 DIAGNOSIS — D735 Infarction of spleen: Secondary | ICD-10-CM

## 2014-04-02 DIAGNOSIS — Z7901 Long term (current) use of anticoagulants: Secondary | ICD-10-CM

## 2014-04-02 DIAGNOSIS — E785 Hyperlipidemia, unspecified: Secondary | ICD-10-CM

## 2014-04-02 DIAGNOSIS — I480 Paroxysmal atrial fibrillation: Secondary | ICD-10-CM

## 2014-04-02 DIAGNOSIS — D7389 Other diseases of spleen: Secondary | ICD-10-CM

## 2014-04-02 DIAGNOSIS — R739 Hyperglycemia, unspecified: Secondary | ICD-10-CM

## 2014-04-02 DIAGNOSIS — N390 Urinary tract infection, site not specified: Secondary | ICD-10-CM

## 2014-04-02 DIAGNOSIS — I4891 Unspecified atrial fibrillation: Secondary | ICD-10-CM

## 2014-04-02 LAB — LIPID PANEL
Cholesterol: 100 mg/dL (ref 0–200)
HDL: 33.4 mg/dL — ABNORMAL LOW (ref >39.00)
LDL Cholesterol: 50 mg/dL (ref 0–99)
NonHDL: 66.6
Total CHOL/HDL Ratio: 3
Triglycerides: 82 mg/dL (ref 0.0–149.0)
VLDL: 16.4 mg/dL (ref 0.0–40.0)

## 2014-04-02 LAB — BASIC METABOLIC PANEL
BUN: 11 mg/dL (ref 6–23)
CHLORIDE: 98 meq/L (ref 96–112)
CO2: 21 mEq/L (ref 19–32)
Calcium: 9.7 mg/dL (ref 8.4–10.5)
Creatinine, Ser: 0.7 mg/dL (ref 0.4–1.2)
GFR: 80.91 mL/min (ref >60.00)
GLUCOSE: 86 mg/dL (ref 70–99)
POTASSIUM: 4.6 meq/L (ref 3.5–5.1)
Sodium: 132 mEq/L — ABNORMAL LOW (ref 135–145)

## 2014-04-02 LAB — CBC WITH DIFFERENTIAL/PLATELET
BASOS ABS: 0.1 10*3/uL (ref 0.0–0.1)
Basophils Relative: 0.6 % (ref 0.0–3.0)
EOS PCT: 4.9 % (ref 0.0–5.0)
Eosinophils Absolute: 0.4 10*3/uL (ref 0.0–0.7)
HEMATOCRIT: 42.5 % (ref 36.0–46.0)
Hemoglobin: 14.1 g/dL (ref 12.0–15.0)
LYMPHS ABS: 1.8 10*3/uL (ref 0.7–4.0)
Lymphocytes Relative: 20.3 % (ref 12.0–46.0)
MCHC: 33.2 g/dL (ref 30.0–36.0)
MCV: 85.2 fl (ref 78.0–100.0)
Monocytes Absolute: 0.8 10*3/uL (ref 0.1–1.0)
Monocytes Relative: 8.7 % (ref 3.0–12.0)
NEUTROS ABS: 5.7 10*3/uL (ref 1.4–7.7)
Neutrophils Relative %: 65.5 % (ref 43.0–77.0)
Platelets: 321 10*3/uL (ref 150.0–400.0)
RBC: 4.99 Mil/uL (ref 3.87–5.11)
RDW: 17.8 % — AB (ref 11.5–15.5)
WBC: 8.7 10*3/uL (ref 4.0–10.5)

## 2014-04-02 LAB — POCT URINALYSIS DIPSTICK
BILIRUBIN UA: NEGATIVE
Blood, UA: NEGATIVE
Glucose, UA: NEGATIVE
Ketones, UA: NEGATIVE
NITRITE UA: POSITIVE
Protein, UA: NEGATIVE
Spec Grav, UA: 1.01
UROBILINOGEN UA: 0.2
pH, UA: 6.5

## 2014-04-02 LAB — HEPATIC FUNCTION PANEL
ALBUMIN: 3.7 g/dL (ref 3.5–5.2)
ALT: 21 U/L (ref 0–35)
AST: 28 U/L (ref 0–37)
Alkaline Phosphatase: 86 U/L (ref 39–117)
Bilirubin, Direct: 0.1 mg/dL (ref 0.0–0.3)
Total Bilirubin: 0.5 mg/dL (ref 0.2–1.2)
Total Protein: 7.4 g/dL (ref 6.0–8.3)

## 2014-04-02 LAB — HEMOGLOBIN A1C: HEMOGLOBIN A1C: 6.2 % (ref 4.6–6.5)

## 2014-04-04 LAB — URINE CULTURE: Colony Count: 50000

## 2014-04-05 MED ORDER — CIPROFLOXACIN HCL 500 MG PO TABS: 500.0000 mg | ORAL_TABLET | Freq: Two times a day (BID) | ORAL | Status: DC

## 2014-04-17 ENCOUNTER — Ambulatory Visit (INDEPENDENT_AMBULATORY_CARE_PROVIDER_SITE_OTHER): Payer: Commercial Managed Care - HMO | Admitting: *Deleted

## 2014-04-17 DIAGNOSIS — D735 Infarction of spleen: Secondary | ICD-10-CM

## 2014-04-17 DIAGNOSIS — D7389 Other diseases of spleen: Secondary | ICD-10-CM

## 2014-04-17 DIAGNOSIS — I480 Paroxysmal atrial fibrillation: Secondary | ICD-10-CM

## 2014-04-17 DIAGNOSIS — Z5181 Encounter for therapeutic drug level monitoring: Secondary | ICD-10-CM

## 2014-04-17 DIAGNOSIS — I4891 Unspecified atrial fibrillation: Secondary | ICD-10-CM

## 2014-04-17 LAB — POCT INR: INR: 2.4

## 2014-04-20 ENCOUNTER — Other Ambulatory Visit: Payer: Self-pay | Admitting: Emergency Medicine

## 2014-04-21 ENCOUNTER — Other Ambulatory Visit: Payer: Self-pay | Admitting: Family Medicine

## 2014-04-22 ENCOUNTER — Telehealth: Payer: Self-pay | Admitting: Emergency Medicine

## 2014-04-22 MED ORDER — BUDESONIDE-FORMOTEROL FUMARATE 160-4.5 MCG/ACT IN AERO: 2.0000 | INHALATION_SPRAY | Freq: Two times a day (BID) | RESPIRATORY_TRACT | Status: DC

## 2014-04-22 MED ORDER — TIOTROPIUM BROMIDE MONOHYDRATE 18 MCG IN CAPS: 18.0000 ug | ORAL_CAPSULE | Freq: Every day | RESPIRATORY_TRACT | Status: DC

## 2014-04-22 NOTE — Telephone Encounter (Signed)
Pt aware RX called into the pharm. Nothing further needed

## 2014-04-22 NOTE — Telephone Encounter (Signed)
Previous 8/31 phone note states Symbicort Spiriva sent to [facility] [address] Called spoke with spouse and apologized for mix-up; advised Spiriva was sent to pharmacy. Spouse requested to verify that pharmacy received this - [facility] Clydie Braun who verified the Spiriva was indeed received Nothing further needed; will sign off

## 2014-05-02 ENCOUNTER — Ambulatory Visit: Payer: Commercial Managed Care - HMO | Admitting: Emergency Medicine

## 2014-05-08 ENCOUNTER — Encounter: Payer: Self-pay | Admitting: Emergency Medicine

## 2014-05-08 ENCOUNTER — Ambulatory Visit (INDEPENDENT_AMBULATORY_CARE_PROVIDER_SITE_OTHER): Payer: Commercial Managed Care - HMO | Admitting: Emergency Medicine

## 2014-05-08 VITALS — BP 110/68 | HR 64 | Temp 97.4°F | Ht 59.0 in | Wt 141.6 lb

## 2014-05-08 DIAGNOSIS — J45909 Unspecified asthma, uncomplicated: Secondary | ICD-10-CM

## 2014-05-08 DIAGNOSIS — Z23 Encounter for immunization: Secondary | ICD-10-CM

## 2014-05-08 MED ORDER — TIOTROPIUM BROMIDE MONOHYDRATE 2.5 MCG/ACT IN AERS: 2.0000 | INHALATION_SPRAY | Freq: Every day | RESPIRATORY_TRACT | Status: DC

## 2014-05-08 MED ORDER — BECLOMETHASONE DIPROPIONATE 80 MCG/ACT IN AERS: 2.0000 | INHALATION_SPRAY | Freq: Two times a day (BID) | RESPIRATORY_TRACT | Status: DC

## 2014-05-08 NOTE — Patient Instructions (Signed)
Please stop symbicort Restart QVAR 2 puffs twice a day  Continue your spiriva daily > we will switch to the Respimat version of this medication,  Flu shot today Follow with Dr Jaxon Flatt in 3 months or sooner if you have any problems. 

## 2014-05-08 NOTE — Progress Notes (Signed)
[name] is a 79 year old woman with a history of mild bronchiectasis and associated mild airflow limitation. She also has allergic rhinitis and postnasal drip. Taking fexofenadine and Rhinocort. Also on Spiriva + QVAR.   ROV 02/21/13 -- bronchiectasis, mild AFL, chronic rhinitis. She is doing [place], loratadine, rhinocort back to 2 sprays. Her breathing has been the same until the weather got hotter. She gets SOB with walking in from parking lot. Occasional cough. She does cough some at night, denies GERD as long as she is on nexium.   ROV 10/17/13 -- bronchiectasis, mild AFL, chronic rhinitis. She has been on QVAR, Spiriva longstanding. She states that her breathing can still be limiting, has been a bit more so in the winter. Her Rhinocort is no longer formulary.   ROV 11/29/13 -- bronchiectasis, mild AFL, chronic rhinitis. Was seen by Dr Sherene Sires in March, found to desaturate with walking. She has been on Spiriva for years despite no tobacco history and only mild AFL. She agreed to trial stopping it at last visit.  She says that she missed it > is more dyspneic without it. She is having exertional SOB and nocturnal cough. CT scan from last visit showed no PE, no ILD.   ROV 01/23/14 -- bronchiectasis, mild AFL, chronic rhinitis, newly dx exertional hypoxemia. She is on spiriva, loratadine, QVAR. She also has ProAir, she uses it but doesn't seem to benefit. Returns today with continued DOE. She is not on the fluticasone right now.   ROV 05/08/14 -- bronchiectasis, mild AFL, chronic rhinitis, exertional hypoxemia. Etiology of her hypoxemia is unclear > CT scan no PE, no ILD.  She is needing to stop when she exerts herself, even with regular walking. She is on spiriva, just changed from Qvar to symbicort to see if she would benefit. She is on loratadine and rhinocort.   Filed Vitals:   05/08/14 1458  BP: 110/68  Pulse: 64  Temp: 97.4 F (36.3 C)  TempSrc: Oral  Height:  (1.499 m)  Weight: 141 lb 9.6 oz  (64.229 kg)  SpO2: 93%   Gen: Pleasant, well-nourished, in no distress,  normal affect  ENT: No lesions,  mouth clear,  oropharynx clear, no postnasal drip  Neck: No JVD, no TMG, no carotid bruits  Lungs: No use of accessory muscles, few insp crackles L base  Cardiovascular: RRR, heart sounds normal, no murmur or gallops, no peripheral edema  Musculoskeletal: No deformities, no cyanosis or clubbing  Neuro: alert, non focal  Skin: Warm, no lesions or rashes   11/12/13 --  COMPARISON: CT ANGIO CHEST W/CM &/OR WO/CM dated 07/12/2011  FINDINGS:  Lungs/Pleura: Night No lobar consolidation. Subsegmental atelectasis  in the lingula and left lower lung  High-resolution images demonstrate mild motion degradation,  especially inferiorly. Given this factor, no evidence of  interstitial lung disease. Expiratory images demonstrate minimal  nonspecific air trapping at the bases.  No pleural fluid.  Heart/Mediastinum: The quality of this examination for evaluation of  pulmonary embolism is good. No evidence of pulmonary embolism.  Aortic and branch vessel atherosclerosis. Mild cardiomegaly.  Multivessel coronary artery atherosclerosis. Multiple small middle  mediastinal nodes. A precarinal node measures 1.4 cm on image 29.  Similar to the prior exam. Mildly prominent but not pathologically  sized hilar nodal tissue bilaterally.  Upper Abdomen: No significant findings.  Bones/Musculoskeletal: Osteopenia.  Review of the MIP images confirms the above findings.  IMPRESSION:  1. No evidence of pulmonary embolism.  2. Cardiomegaly with multivessel coronary  artery atherosclerosis.  3. Although the high-resolution images are motion degraded, no  evidence of interstitial lung disease is identified.  4. Mild mediastinal adenopathy with prominent but not pathologically  enlarged hilar nodal tissue bilaterally. Given stability since  07/12/2011, reactive etiologies are favored   ASTHMA Please  stop symbicort Restart QVAR 2 puffs twice a day  Continue your spiriva daily > we will switch to the Respimat version of this medication,  Flu shot today Follow with Dr Delton Coombes in 3 months or sooner if you have any problems.

## 2014-05-08 NOTE — Addendum Note (Signed)
Addended by: Gweneth Dimitri D on: 05/08/2014 03:39 PM   Modules accepted: Orders, Medications

## 2014-05-08 NOTE — Assessment & Plan Note (Signed)
Please stop symbicort Restart QVAR 2 puffs twice a day  Continue your spiriva daily > we will switch to the Respimat version of this medication,  Flu shot today Follow with Dr Delton Coombes in 3 months or sooner if you have any problems.

## 2014-05-15 ENCOUNTER — Ambulatory Visit (INDEPENDENT_AMBULATORY_CARE_PROVIDER_SITE_OTHER): Payer: Commercial Managed Care - HMO | Admitting: *Deleted

## 2014-05-15 ENCOUNTER — Telehealth: Payer: Self-pay | Admitting: Cardiology

## 2014-05-15 DIAGNOSIS — I495 Sick sinus syndrome: Secondary | ICD-10-CM

## 2014-05-15 LAB — MDC_IDC_ENUM_SESS_TYPE_REMOTE
Battery Impedance: 1950 Ohm
Brady Statistic RV Percent Paced: 96 %
Lead Channel Impedance Value: 67 Ohm
Lead Channel Pacing Threshold Pulse Width: 0.4 ms
Lead Channel Setting Pacing Amplitude: 2.5 V
Lead Channel Setting Sensing Sensitivity: 5.6 mV
MDC IDC MSMT BATTERY REMAINING LONGEVITY: 26 mo
MDC IDC MSMT BATTERY VOLTAGE: 2.74 V
MDC IDC MSMT LEADCHNL RV IMPEDANCE VALUE: 467 Ohm
MDC IDC MSMT LEADCHNL RV PACING THRESHOLD AMPLITUDE: 0.75 V
MDC IDC SESS DTM: 20150923164701
MDC IDC SET LEADCHNL RV PACING PULSEWIDTH: 0.46 ms

## 2014-05-15 NOTE — Progress Notes (Signed)
Remote pacemaker transmission.   

## 2014-05-15 NOTE — Telephone Encounter (Signed)
Confirmed remote transmission with pt husband.  

## 2014-05-21 ENCOUNTER — Other Ambulatory Visit: Payer: Self-pay

## 2014-05-21 MED ORDER — ESOMEPRAZOLE MAGNESIUM 40 MG PO CPDR: DELAYED_RELEASE_CAPSULE | ORAL | Status: DC

## 2014-05-22 ENCOUNTER — Other Ambulatory Visit: Payer: Self-pay

## 2014-05-22 MED ORDER — ESOMEPRAZOLE MAGNESIUM 40 MG PO CPDR: DELAYED_RELEASE_CAPSULE | ORAL | Status: DC

## 2014-05-27 ENCOUNTER — Encounter: Payer: Self-pay | Admitting: Cardiology

## 2014-05-27 ENCOUNTER — Other Ambulatory Visit: Payer: Self-pay

## 2014-05-27 MED ORDER — TRAZODONE HCL 100 MG PO TABS: ORAL_TABLET | ORAL | Status: DC

## 2014-05-27 NOTE — Telephone Encounter (Signed)
Rx has been faxed.    KP 

## 2014-06-04 ENCOUNTER — Encounter: Payer: Self-pay | Admitting: Internal Medicine

## 2014-06-23 ENCOUNTER — Other Ambulatory Visit: Payer: Self-pay | Admitting: Family Medicine

## 2014-06-24 ENCOUNTER — Telehealth: Payer: Self-pay | Admitting: Emergency Medicine

## 2014-06-24 NOTE — Telephone Encounter (Signed)
I called spoke with pt spouse. Made him aware to call us at the end of December for the change. Nothing further needed

## 2014-06-24 NOTE — Telephone Encounter (Signed)
Last seen 04/01/14 and filled 05/30/14 #60  Please advice      KP

## 2014-07-04 ENCOUNTER — Other Ambulatory Visit: Payer: Self-pay | Admitting: Internal Medicine

## 2014-07-20 ENCOUNTER — Other Ambulatory Visit: Payer: Self-pay | Admitting: Family Medicine

## 2014-07-22 ENCOUNTER — Ambulatory Visit (INDEPENDENT_AMBULATORY_CARE_PROVIDER_SITE_OTHER): Payer: Commercial Managed Care - HMO

## 2014-07-22 DIAGNOSIS — Z5181 Encounter for therapeutic drug level monitoring: Secondary | ICD-10-CM

## 2014-07-22 DIAGNOSIS — D735 Infarction of spleen: Secondary | ICD-10-CM

## 2014-07-22 DIAGNOSIS — I480 Paroxysmal atrial fibrillation: Secondary | ICD-10-CM

## 2014-07-22 LAB — POCT INR: INR: 3.8

## 2014-07-22 NOTE — Telephone Encounter (Signed)
Last seen 04/01/14 and filled 06/24/14 #60.   Please advise    KP

## 2014-07-26 ENCOUNTER — Other Ambulatory Visit: Payer: Self-pay | Admitting: Internal Medicine

## 2014-08-07 ENCOUNTER — Other Ambulatory Visit: Payer: Self-pay | Admitting: Internal Medicine

## 2014-08-08 ENCOUNTER — Encounter: Payer: Self-pay | Admitting: Emergency Medicine

## 2014-08-08 ENCOUNTER — Ambulatory Visit (INDEPENDENT_AMBULATORY_CARE_PROVIDER_SITE_OTHER): Payer: Commercial Managed Care - HMO | Admitting: Emergency Medicine

## 2014-08-08 VITALS — BP 108/58 | HR 64 | Ht 59.0 in | Wt 141.8 lb

## 2014-08-08 DIAGNOSIS — J479 Bronchiectasis, uncomplicated: Secondary | ICD-10-CM

## 2014-08-08 MED ORDER — TIOTROPIUM BROMIDE MONOHYDRATE 18 MCG IN CAPS: 18.0000 ug | ORAL_CAPSULE | Freq: Every day | RESPIRATORY_TRACT | Status: DC

## 2014-08-08 MED ORDER — ALBUTEROL SULFATE HFA 108 (90 BASE) MCG/ACT IN AERS: 2.0000 | INHALATION_SPRAY | Freq: Four times a day (QID) | RESPIRATORY_TRACT | Status: DC | PRN

## 2014-08-08 NOTE — Progress Notes (Signed)
[name] is a 79 year old woman with a history of mild bronchiectasis and associated mild airflow limitation. She also has allergic rhinitis and postnasal drip. Taking fexofenadine and Rhinocort. Also on Spiriva + QVAR.   ROV 02/21/13 -- bronchiectasis, mild AFL, chronic rhinitis. She is doing [place], loratadine, rhinocort back to 2 sprays. Her breathing has been the same until the weather got hotter. She gets SOB with walking in from parking lot. Occasional cough. She does cough some at night, denies GERD as long as she is on nexium.   ROV 10/17/13 -- bronchiectasis, mild AFL, chronic rhinitis. She has been on QVAR, Spiriva longstanding. She states that her breathing can still be limiting, has been a bit more so in the winter. Her Rhinocort is no longer formulary.   ROV 11/29/13 -- bronchiectasis, mild AFL, chronic rhinitis. Was seen by Dr Sherene SiresWert in March, found to desaturate with walking. She has been on Spiriva for years despite no tobacco history and only mild AFL. She agreed to trial stopping it at last visit.  She says that she missed it > is more dyspneic without it. She is having exertional SOB and nocturnal cough. CT scan from last visit showed no PE, no ILD.   ROV 01/23/14 -- bronchiectasis, mild AFL, chronic rhinitis, newly dx exertional hypoxemia. She is on spiriva, loratadine, QVAR. She also has ProAir, she uses it but doesn't seem to benefit. Returns today with continued DOE. She is not on the fluticasone right now.   ROV 05/08/14 -- bronchiectasis, mild AFL, chronic rhinitis, exertional hypoxemia. Etiology of her hypoxemia is unclear > CT scan no PE, no ILD.  She is needing to stop when she exerts herself, even with regular walking. She is on spiriva, just changed from Qvar to symbicort to see if she would benefit. She is on loratadine and rhinocort.   ROV 08/08/14 -- follow up visit for mild AFL, bronchiectasis, cough and rhinitis. Fairly newly identified hypoxemia. Last time we changed back from  symbicort to qvar, and changed the spiriva to respimat version. Her insurance co wants to change from proair to ventolin. She is having some trouble turning the new spiriva version. She would like to change back to the capsules.     Filed Vitals:   08/08/14 1503  BP: 108/58  Pulse: 64  Height: 4\' 11"  (1.499 m)  Weight: 141 lb 12.8 oz (64.32 kg)  SpO2: 92%   Gen: Pleasant, well-nourished, in no distress,  normal affect  ENT: No lesions,  mouth clear,  oropharynx clear, no postnasal drip  Neck: No JVD, no TMG, no carotid bruits  Lungs: No use of accessory muscles, few insp crackles L base  Cardiovascular: RRR, heart sounds normal, no murmur or gallops, no peripheral edema  Musculoskeletal: No deformities, no cyanosis or clubbing  Neuro: alert, non focal  Skin: Warm, no lesions or rashes   11/12/13 --  COMPARISON: CT ANGIO CHEST W/CM &/OR WO/CM dated 07/12/2011  FINDINGS:  Lungs/Pleura: Night No lobar consolidation. Subsegmental atelectasis  in the lingula and left lower lung  High-resolution images demonstrate mild motion degradation,  especially inferiorly. Given this factor, no evidence of  interstitial lung disease. Expiratory images demonstrate minimal  nonspecific air trapping at the bases.  No pleural fluid.  Heart/Mediastinum: The quality of this examination for evaluation of  pulmonary embolism is good. No evidence of pulmonary embolism.  Aortic and branch vessel atherosclerosis. Mild cardiomegaly.  Multivessel coronary artery atherosclerosis. Multiple small middle  mediastinal nodes. A precarinal node  measures 1.4 cm on image 29.  Similar to the prior exam. Mildly prominent but not pathologically  sized hilar nodal tissue bilaterally.  Upper Abdomen: No significant findings.  Bones/Musculoskeletal: Osteopenia.  Review of the MIP images confirms the above findings.  IMPRESSION:  1. No evidence of pulmonary embolism.  2. Cardiomegaly with multivessel coronary  artery atherosclerosis.  3. Although the high-resolution images are motion degraded, no  evidence of interstitial lung disease is identified.  4. Mild mediastinal adenopathy with prominent but not pathologically  enlarged hilar nodal tissue bilaterally. Given stability since  07/12/2011, reactive etiologies are favored   BRONCHIECTASIS Walking oximetry today. If her oxygen level is low we will start oxygen with exertion.  We will change her Spiriva back to the capsule version.  We will change your ProAir to Ventolin.  Continue QVAR Follow with Dr Delton Coombes in 4 months or sooner if you have any problems.

## 2014-08-08 NOTE — Addendum Note (Signed)
Addended by: Velvet Bathe on: 08/08/2014 04:07 PM   Modules accepted: Orders, Medications

## 2014-08-08 NOTE — Patient Instructions (Signed)
Walking oximetry today. If her oxygen level is low we will start oxygen with exertion.  We will change her Spiriva back to the capsule version.  We will change your ProAir to Ventolin.  Continue QVAR Follow with Dr Sama Arauz in 4 months or sooner if you have any problems.  

## 2014-08-08 NOTE — Assessment & Plan Note (Signed)
Walking oximetry today. If her oxygen level is low we will start oxygen with exertion.  We will change her Spiriva back to the capsule version.  We will change your ProAir to Ventolin.  Continue QVAR Follow with Dr Delton Coombes in 4 months or sooner if you have any problems.

## 2014-08-19 ENCOUNTER — Other Ambulatory Visit: Payer: Self-pay | Admitting: Family Medicine

## 2014-08-19 ENCOUNTER — Ambulatory Visit (INDEPENDENT_AMBULATORY_CARE_PROVIDER_SITE_OTHER): Payer: Commercial Managed Care - HMO | Admitting: *Deleted

## 2014-08-19 DIAGNOSIS — I480 Paroxysmal atrial fibrillation: Secondary | ICD-10-CM

## 2014-08-19 DIAGNOSIS — I495 Sick sinus syndrome: Secondary | ICD-10-CM

## 2014-08-19 NOTE — Telephone Encounter (Signed)
Last seen 04/01/14 and filled 07/22/14 #60.   Please advise     KP

## 2014-08-19 NOTE — Progress Notes (Signed)
Remote pacemaker check. 

## 2014-08-21 ENCOUNTER — Ambulatory Visit (INDEPENDENT_AMBULATORY_CARE_PROVIDER_SITE_OTHER): Payer: Commercial Managed Care - HMO | Admitting: Pharmacist

## 2014-08-21 DIAGNOSIS — I480 Paroxysmal atrial fibrillation: Secondary | ICD-10-CM

## 2014-08-21 DIAGNOSIS — Z5181 Encounter for therapeutic drug level monitoring: Secondary | ICD-10-CM

## 2014-08-21 DIAGNOSIS — D735 Infarction of spleen: Secondary | ICD-10-CM

## 2014-08-21 LAB — MDC_IDC_ENUM_SESS_TYPE_REMOTE
Battery Impedance: 2163 Ohm
Battery Remaining Longevity: 24 mo
Battery Voltage: 2.73 V
Date Time Interrogation Session: 20151228171534
Lead Channel Impedance Value: 458 Ohm
Lead Channel Pacing Threshold Amplitude: 0.875 V
Lead Channel Pacing Threshold Pulse Width: 0.4 ms
Lead Channel Setting Pacing Amplitude: 2.5 V
Lead Channel Setting Pacing Pulse Width: 0.4 ms
MDC IDC MSMT LEADCHNL RA IMPEDANCE VALUE: 67 Ohm
MDC IDC SET LEADCHNL RV SENSING SENSITIVITY: 5.6 mV
MDC IDC STAT BRADY RV PERCENT PACED: 96 %

## 2014-08-21 LAB — POCT INR: INR: 3

## 2014-09-06 ENCOUNTER — Encounter: Payer: Self-pay | Admitting: Cardiology

## 2014-09-11 ENCOUNTER — Encounter: Payer: Self-pay | Admitting: Internal Medicine

## 2014-09-15 ENCOUNTER — Other Ambulatory Visit: Payer: Self-pay | Admitting: Family Medicine

## 2014-09-16 NOTE — Telephone Encounter (Signed)
Last seen 04/01/14 and filled 08/19/14 #60  Please advise     KP

## 2014-09-18 ENCOUNTER — Ambulatory Visit (INDEPENDENT_AMBULATORY_CARE_PROVIDER_SITE_OTHER): Payer: Commercial Managed Care - HMO | Admitting: *Deleted

## 2014-09-18 DIAGNOSIS — Z5181 Encounter for therapeutic drug level monitoring: Secondary | ICD-10-CM

## 2014-09-18 DIAGNOSIS — D735 Infarction of spleen: Secondary | ICD-10-CM

## 2014-09-18 DIAGNOSIS — I480 Paroxysmal atrial fibrillation: Secondary | ICD-10-CM

## 2014-09-18 LAB — POCT INR: INR: 2.5

## 2014-10-12 ENCOUNTER — Other Ambulatory Visit: Payer: Self-pay | Admitting: Family Medicine

## 2014-10-14 NOTE — Telephone Encounter (Signed)
Last seen 04/01/14 and filled 09/16/14 #60  Please advise    KP

## 2014-10-16 ENCOUNTER — Ambulatory Visit (INDEPENDENT_AMBULATORY_CARE_PROVIDER_SITE_OTHER): Payer: Commercial Managed Care - HMO | Admitting: *Deleted

## 2014-10-16 ENCOUNTER — Telehealth: Payer: Self-pay | Admitting: Family Medicine

## 2014-10-16 DIAGNOSIS — I480 Paroxysmal atrial fibrillation: Secondary | ICD-10-CM

## 2014-10-16 DIAGNOSIS — Z5181 Encounter for therapeutic drug level monitoring: Secondary | ICD-10-CM

## 2014-10-16 DIAGNOSIS — D735 Infarction of spleen: Secondary | ICD-10-CM

## 2014-10-16 LAB — POCT INR: INR: 2.6

## 2014-10-16 NOTE — Telephone Encounter (Signed)
Caller name: Mrs. Audelia Hives Relation to pt: Referral Coordinator  Call back number: [phone]   Reason for call:  [facility] requesting a referral  Milana Huntsman [facility]  Member ID [id number] Appointment was with Dr. Marca Ancona today  DX code required I48.0        [phone]

## 2014-10-18 NOTE — Telephone Encounter (Signed)
THE PATIENT'S ID NUMBER FOR [employer] IS [id number]  I DID THE REFERRAL FOR DR [facility] ALTHOUGH I DO NOT SEE THAT APPOINTMENT ON THE PATIENTS APPT DESK  THE APPROVAL # IS [id number]  SAW AN APPOINTMENT WITH DR Graciela Husbands AT [facility] AS WELL SO DID A REFERRAL FOR THAT APPOINTMENT  IT'S APPROVAL # IS [id number]

## 2014-10-21 ENCOUNTER — Other Ambulatory Visit: Payer: Self-pay | Admitting: Family Medicine

## 2014-11-12 ENCOUNTER — Other Ambulatory Visit: Payer: Self-pay | Admitting: Family Medicine

## 2014-11-12 NOTE — Telephone Encounter (Signed)
Last seen 04/01/14 and filled 10/14/14 # 60 No UDS  Please advise      Kp

## 2014-11-21 ENCOUNTER — Encounter: Payer: Self-pay | Admitting: Internal Medicine

## 2014-12-03 ENCOUNTER — Encounter: Payer: Commercial Managed Care - HMO | Admitting: Internal Medicine

## 2014-12-10 ENCOUNTER — Ambulatory Visit (INDEPENDENT_AMBULATORY_CARE_PROVIDER_SITE_OTHER): Payer: Commercial Managed Care - HMO | Admitting: Internal Medicine

## 2014-12-10 ENCOUNTER — Ambulatory Visit (INDEPENDENT_AMBULATORY_CARE_PROVIDER_SITE_OTHER): Payer: Commercial Managed Care - HMO | Admitting: *Deleted

## 2014-12-10 ENCOUNTER — Encounter: Payer: Self-pay | Admitting: Internal Medicine

## 2014-12-10 VITALS — BP 148/70 | HR 72 | Ht 59.0 in | Wt 139.8 lb

## 2014-12-10 DIAGNOSIS — I495 Sick sinus syndrome: Secondary | ICD-10-CM

## 2014-12-10 DIAGNOSIS — I480 Paroxysmal atrial fibrillation: Secondary | ICD-10-CM

## 2014-12-10 DIAGNOSIS — I482 Chronic atrial fibrillation, unspecified: Secondary | ICD-10-CM

## 2014-12-10 DIAGNOSIS — R0609 Other forms of dyspnea: Secondary | ICD-10-CM

## 2014-12-10 DIAGNOSIS — Z5181 Encounter for therapeutic drug level monitoring: Secondary | ICD-10-CM | POA: Diagnosis not present

## 2014-12-10 DIAGNOSIS — D735 Infarction of spleen: Secondary | ICD-10-CM

## 2014-12-10 LAB — MDC_IDC_ENUM_SESS_TYPE_INCLINIC
Battery Impedance: 2353 Ohm
Battery Voltage: 2.73 V
Brady Statistic RV Percent Paced: 95 %
Lead Channel Impedance Value: 67 Ohm
Lead Channel Pacing Threshold Amplitude: 0.75 V
Lead Channel Setting Pacing Amplitude: 2.5 V
MDC IDC MSMT BATTERY REMAINING LONGEVITY: 23 mo
MDC IDC MSMT LEADCHNL RV IMPEDANCE VALUE: 465 Ohm
MDC IDC MSMT LEADCHNL RV PACING THRESHOLD PULSEWIDTH: 0.4 ms
MDC IDC MSMT LEADCHNL RV SENSING INTR AMPL: 15.67 mV
MDC IDC SESS DTM: 20160419140425
MDC IDC SET LEADCHNL RV PACING PULSEWIDTH: 0.4 ms
MDC IDC SET LEADCHNL RV SENSING SENSITIVITY: 5.6 mV

## 2014-12-10 LAB — POCT INR: INR: 2.2

## 2014-12-10 MED ORDER — FUROSEMIDE 20 MG PO TABS: ORAL_TABLET | ORAL | Status: DC

## 2014-12-10 NOTE — Patient Instructions (Addendum)
Medication Instructions:  Your physician has recommended you make the following change in your medication:  Increase furosemide to 40 mg by mouth daily until swelling resolves.  Then change to furosemide 40 mg daily alternating with 20 mg daily.    Labwork: none  Testing/Procedures: none  Follow-Up: Remote monitoring is used to monitor your Pacemaker of ICD from home. This monitoring reduces the number of office visits required to check your device to one time per year. It allows [name] to keep an eye on the functioning of your device to ensure it is working properly. You are scheduled for a device check from home on March 11, 2015. You may send your transmission at any time that day. If you have a wireless device, the transmission will be sent automatically. After your physician reviews your transmission, you will receive a postcard with your next transmission date.  Your physician wants you to follow-up in: 12 months.  You will receive a reminder letter in the mail two months in advance. If you don't receive a letter, please call our office to schedule the follow-up appointment.   Dr. Kavin Leech office will be contacting you regarding an earlier appointment time.

## 2014-12-10 NOTE — Progress Notes (Signed)
Patient Care Team: Lelon Perla, DO as PCP - General   HPI  [name] is a 80 y.o. female Is seen in followup with a history of tako-tsubo nonischemic cardiomyopathy in 9/07 which has resolved. She also has a history of atrial flutter and is status post ablation as well as placement of a permanent pacemaker due to profound sinus node dysfunction.   She continues to struggle with dyspnea on exertion. She was hypoxemic at our last office visit. She saw Dr. Delton Coombes, in the interim. Apparently he thought that she was not a candidate for oxygen therapy..  .   she has also noted increasing peripheral edema. Her shortness of breath is worse over recent weeks.   Past Medical History  Diagnosis Date  . Asthma   . Rhinitis   . Osteoarthritis   . Osteoporosis   . Peptic ulcer disease   . Depression   . Atrial fibrillation     s/p ablation   . Takotsubo cardiomyopathy     resolved  . Shingles   . Eosinophilia   . Sinoatrial node dysfunction   . GERD (gastroesophageal reflux disease)   . Bronchiectasis   . Peptic ulcer disease   . Osteoarthritis   . Herpes zoster   . Hyperlipemia   . Injury of splenic artery     infarct    Past Surgical History  Procedure Laterality Date  . Cholecystectomy    . Tonsillectomy    . Cataract extraction    . Pacemaker insertion  09/29/06    MDT [id number] dual chamber pacemaker implanted for SSS - programmed VVIR 2/2 permanent atrial fibrillation  . Cosmetic surgery      Current Outpatient Prescriptions  Medication Sig Dispense Refill  . albuterol (PROAIR HFA) 108 (90 BASE) MCG/ACT inhaler Inhale 2 puffs into the lungs every 4 (four) hours as needed for wheezing or shortness of breath. 1 Inhaler 11  . albuterol (PROVENTIL HFA;VENTOLIN HFA) 108 (90 BASE) MCG/ACT inhaler Inhale 2 puffs into the lungs every 6 (six) hours as needed for wheezing or shortness of breath. Pt to receive Ventolin only. 1 Inhaler 6  . Ascorbic Acid (VITAMIN C) 1000  MG tablet Take 1,000 mg by mouth daily.      [name] atorvastatin (LIPITOR) 10 MG tablet TAKE 1 TABLET EVERY DAY 90 tablet 1  . beclomethasone (QVAR) 80 MCG/ACT inhaler Inhale 2 puffs into the lungs 2 (two) times daily. 1 Inhaler 6  . BEPREVE 1.5 % SOLN at bedtime.     . budesonide (RHINOCORT AQUA) 32 MCG/ACT nasal spray Place 1 spray into both nostrils daily. 1 Bottle 6  . Calcium Carbonate (CALCIUM 600) 1500 MG TABS Take 1 tablet by mouth daily.      . Cholecalciferol (VITAMIN D3) 1000 UNITS CAPS Take 1 capsule by mouth daily.      [name] escitalopram (LEXAPRO) 10 MG tablet TAKE 1 TABLET (10 MG TOTAL) BY MOUTH DAILY. 30 tablet 5  . esomeprazole (NEXIUM) 40 MG capsule TAKE ONE CAPSULE BY MOUTH EVERY DAY BEFORE BREAKFAST 30 capsule 5  . fluticasone (FLONASE) 50 MCG/ACT nasal spray Place 2 sprays into both nostrils 2 (two) times daily. 16 g 11  . furosemide (LASIX) 20 MG tablet TAKE 1 TABLET (20 MG TOTAL) BY MOUTH DAILY. 30 tablet 3  . loratadine (CLARITIN) 10 MG tablet Take 10 mg by mouth daily.    . Multiple Vitamin (MULTIVITAMIN) tablet Take 1 tablet by mouth daily.      [name]  tiotropium (SPIRIVA) 18 MCG inhalation capsule Place 1 capsule (18 mcg total) into inhaler and inhale daily. 30 capsule 6  . traZODone (DESYREL) 100 MG tablet TAKE 2 TABLETS BY MOUTH AT BEDTIME 60 tablet 0  . warfarin (COUMADIN) 2.5 MG tablet TAKE 1/2 TAB ONCE DAILY EXCEPT SUN,TUES,AND THURS.TAKE 1 TAB OR AS DIRECTED BY CLINIC 30 tablet 3  . [DISCONTINUED] fexofenadine (ALLEGRA) 180 MG tablet Take 180 mg by mouth daily.      No current facility-administered medications for this visit.    Allergies  Allergen Reactions  . Benzoyl Peroxide     REACTION: unspecified  . Neomycin-Bacitracin Zn-Polymyx     REACTION: unspecified    Review of Systems negative except from HPI and PMH  Physical Exam BP 148/70 mmHg  Pulse 72  Ht  (1.499 m)  Wt 139 lb 12.8 oz (63.413 kg)  BMI 28.22 kg/m2  SpO2 88% Well developed and nourished  in mild respiratory distress HENT normal Neck supple with JVP >10 Clear Regular rate and rhythm, no murmurs or gallops Abd-soft with active BS No Clubbing cyanosis  1-2+ edema Skin-warm and dry A & Oriented  Grossly normal sensory and motor function   ECG demonstrates atrial fibrillation with ventricular pacing  Assessment and  Plan  Atrial fibrillation-permanent  Dyspnea on exertion  Bronchiectasis  Pacemaker-[employer] The patient's device was interrogated.  The information was reviewed. No changes were made in the programming.       she remains quite hypoxic. It is her impression that this is worse. There is probably a component of right heart failure contributing to this. We will try to diurese her gently. We may not be all that successful given her pulmonary disease. I have spoken with her pulmonologist; he will arrange to see her next week. We will measure her ambulatory oxygen prior to departing today but I think with the fluid issue it is not her best  We will increase her lasix 20>>40 until edema returns to baseline then 40 alt with 20  Renal functono was normla 8/15

## 2014-12-16 ENCOUNTER — Encounter: Payer: Self-pay | Admitting: Internal Medicine

## 2014-12-16 ENCOUNTER — Other Ambulatory Visit: Payer: Self-pay | Admitting: Family Medicine

## 2014-12-16 NOTE — Telephone Encounter (Signed)
Last seen 04/01/14 and filled 11/12/14 #60  Please advise     KP

## 2014-12-17 ENCOUNTER — Ambulatory Visit (HOSPITAL_COMMUNITY): Payer: Commercial Managed Care - HMO | Attending: Family Medicine

## 2014-12-17 ENCOUNTER — Ambulatory Visit (INDEPENDENT_AMBULATORY_CARE_PROVIDER_SITE_OTHER): Payer: Commercial Managed Care - HMO | Admitting: Emergency Medicine

## 2014-12-17 ENCOUNTER — Telehealth: Payer: Self-pay | Admitting: Emergency Medicine

## 2014-12-17 ENCOUNTER — Encounter: Payer: Self-pay | Admitting: Emergency Medicine

## 2014-12-17 VITALS — BP 102/70 | HR 80 | Ht 59.0 in | Wt 139.0 lb

## 2014-12-17 DIAGNOSIS — R600 Localized edema: Secondary | ICD-10-CM

## 2014-12-17 DIAGNOSIS — J479 Bronchiectasis, uncomplicated: Secondary | ICD-10-CM | POA: Diagnosis not present

## 2014-12-17 DIAGNOSIS — I82401 Acute embolism and thrombosis of unspecified deep veins of right lower extremity: Secondary | ICD-10-CM

## 2014-12-17 DIAGNOSIS — R0902 Hypoxemia: Secondary | ICD-10-CM | POA: Diagnosis not present

## 2014-12-17 NOTE — Assessment & Plan Note (Signed)
Multifactorial. She has documented exertional hypoxemia and does qualify for oxygen today. We will start 2 L/m with all exertion. I believe that she needs to have repeat forward function testing performed, an echocardiogram to assess any contribution of her left ventricular function and right heart function. Is also possible that despite anticoagulation she has chronic thromboembolic disease. Her asymmetric lower extremity edema is concerning. We will perform a Doppler ultrasound of her right lower extremity

## 2014-12-17 NOTE — Telephone Encounter (Signed)
PCC's please re-fax 4lpm 02 order to [facility].  Thanks!

## 2014-12-17 NOTE — Telephone Encounter (Signed)
Spoke with Carol-she needs to make sure of correct liter flow for patient and have a signed order from [facility] faxed to her. Order states 4 L and in OV notes states 2 L.   RB please advise on this order and we can have PCC's refax the order. Thanks.

## 2014-12-17 NOTE — Patient Instructions (Signed)
You Qualify for oxygen while walking. We will arrange for this. Please wear 2 L/m with all exertion We will perform an ultrasound test of your right leg We will perform an echocardiogram to be reviewed by Dr. Graciela Husbands We will repeat your full breathing tests Follow with Dr Delton Coombes in 1 month

## 2014-12-17 NOTE — Telephone Encounter (Signed)
RB please sign off on this order so PCC's can refax it.  Thanks!

## 2014-12-17 NOTE — Assessment & Plan Note (Signed)
Her LV function had improved on echocardiogram but this has not been checked recently. She now has lower extremity edema and worsening dyspnea.  Pacemaker is operating correctly and she is followed closely by Dr. Graciela Husbands. I would like to repeat her TTE to establish that her LV fxn is stable and to assess R heart fxn.

## 2014-12-17 NOTE — Assessment & Plan Note (Signed)
Mild by CT scan from one year ago

## 2014-12-17 NOTE — Progress Notes (Signed)
[name] is a 80 year old woman with a history of mild bronchiectasis and associated mild airflow limitation. She also has allergic rhinitis and postnasal drip. Taking fexofenadine and Rhinocort. Also on Spiriva + QVAR.    ROV 01/23/14 -- bronchiectasis, mild AFL, chronic rhinitis, newly dx exertional hypoxemia. She is on spiriva, loratadine, QVAR. She also has ProAir, she uses it but doesn't seem to benefit. Returns today with continued DOE. She is not on the fluticasone right now.   ROV 05/08/14 -- bronchiectasis, mild AFL, chronic rhinitis, exertional hypoxemia. Etiology of her hypoxemia is unclear > CT scan no PE, no ILD.  She is needing to stop when she exerts herself, even with regular walking. She is on spiriva, just changed from Qvar to symbicort to see if she would benefit. She is on loratadine and rhinocort.   ROV 08/08/14 -- follow up visit for mild AFL, bronchiectasis, cough and rhinitis. Fairly newly identified hypoxemia. Last time we changed back from symbicort to qvar, and changed the spiriva to respimat version. Her insurance co wants to change from proair to ventolin. She is having some trouble turning the new spiriva version. She would like to change back to the capsules.   ROV 12/17/14 -- follow-up visit for history of bronchiectasis, cough allergic rhinitis. She has mild obstructive lung disease based on spirometry from ??.  She has a hx atrial fibrillation and  Cardiomyopathy (improved).  She saw Dr Graciela Husbands recently and she had evidence for some volume overload, also documented exertional hypoxemia (she had a non-qualifying walking oximetry at our last visit). Her lasix was increased with some improvement but she still remains limited. She has asymmetric LE edema, R > L. She has been on coumadin, making DVT less likely.      Filed Vitals:   12/17/14 0952  BP: 102/70  Pulse: 80  Height: 4\' 11"  (1.499 m)  Weight: 139 lb (63.05 kg)  SpO2: 90%   Gen: Pleasant, well-nourished, in no  distress,  normal affect  ENT: No lesions,  mouth clear,  oropharynx clear, no postnasal drip  Neck: No JVD, no TMG, no carotid bruits  Lungs: No use of accessory muscles, small breaths.   Cardiovascular: RRR, heart sounds normal, no murmur or gallops, asymmetric R> L LE edema.   Musculoskeletal: No deformities, no cyanosis or clubbing  Neuro: alert, non focal  Skin: Warm, no lesions or rashes   11/12/13 --  COMPARISON: CT ANGIO CHEST W/CM &/OR WO/CM dated 07/12/2011  FINDINGS:  Lungs/Pleura: Night No lobar consolidation. Subsegmental atelectasis  in the lingula and left lower lung  High-resolution images demonstrate mild motion degradation,  especially inferiorly. Given this factor, no evidence of  interstitial lung disease. Expiratory images demonstrate minimal  nonspecific air trapping at the bases.  No pleural fluid.  Heart/Mediastinum: The quality of this examination for evaluation of  pulmonary embolism is good. No evidence of pulmonary embolism.  Aortic and branch vessel atherosclerosis. Mild cardiomegaly.  Multivessel coronary artery atherosclerosis. Multiple small middle  mediastinal nodes. A precarinal node measures 1.4 cm on image 29.  Similar to the prior exam. Mildly prominent but not pathologically  sized hilar nodal tissue bilaterally.  Upper Abdomen: No significant findings.  Bones/Musculoskeletal: Osteopenia.  Review of the MIP images confirms the above findings.  IMPRESSION:  1. No evidence of pulmonary embolism.  2. Cardiomegaly with multivessel coronary artery atherosclerosis.  3. Although the high-resolution images are motion degraded, no  evidence of interstitial lung disease is identified.  4. Mild mediastinal  adenopathy with prominent but not pathologically  enlarged hilar nodal tissue bilaterally. Given stability since  07/12/2011, reactive etiologies are favored   CARDIOMYOPATHY Her LV function had improved on echocardiogram but this has not  been checked recently. She now has lower extremity edema and worsening dyspnea.  Pacemaker is operating correctly and she is followed closely by Dr. Graciela Husbands. I would like to repeat her TTE to establish that her LV fxn is stable and to assess R heart fxn.    BRONCHIECTASIS Mild by CT scan from one year ago   DYSPNEA ON EXERTION Multifactorial. She has documented exertional hypoxemia and does qualify for oxygen today. We will start 2 L/m with all exertion. I believe that she needs to have repeat forward function testing performed, an echocardiogram to assess any contribution of her left ventricular function and right heart function. Is also possible that despite anticoagulation she has chronic thromboembolic disease. Her asymmetric lower extremity edema is concerning. We will perform a Doppler ultrasound of her right lower extremity

## 2014-12-17 NOTE — Progress Notes (Signed)
Lower extremity venous of the right leg performed

## 2014-12-17 NOTE — Telephone Encounter (Signed)
The note is incorrect, use 4L/min

## 2014-12-18 NOTE — Telephone Encounter (Signed)
Order has already been resent per Rhonda's documentation on the order. Nothing further was needed.

## 2014-12-19 ENCOUNTER — Ambulatory Visit (HOSPITAL_COMMUNITY): Payer: Commercial Managed Care - HMO

## 2014-12-20 ENCOUNTER — Encounter (HOSPITAL_COMMUNITY): Payer: Self-pay | Admitting: Emergency Medicine

## 2014-12-20 ENCOUNTER — Other Ambulatory Visit: Payer: Self-pay | Admitting: Family Medicine

## 2014-12-25 ENCOUNTER — Other Ambulatory Visit: Payer: Self-pay

## 2014-12-25 ENCOUNTER — Ambulatory Visit (HOSPITAL_COMMUNITY): Payer: Commercial Managed Care - HMO | Attending: Cardiovascular Disease

## 2014-12-25 DIAGNOSIS — E785 Hyperlipidemia, unspecified: Secondary | ICD-10-CM | POA: Diagnosis not present

## 2014-12-25 DIAGNOSIS — R0600 Dyspnea, unspecified: Secondary | ICD-10-CM | POA: Diagnosis not present

## 2014-12-25 DIAGNOSIS — J479 Bronchiectasis, uncomplicated: Secondary | ICD-10-CM

## 2014-12-30 ENCOUNTER — Ambulatory Visit: Payer: Commercial Managed Care - HMO | Admitting: Emergency Medicine

## 2015-01-01 ENCOUNTER — Ambulatory Visit: Payer: Commercial Managed Care - HMO | Admitting: Emergency Medicine

## 2015-01-13 ENCOUNTER — Other Ambulatory Visit: Payer: Self-pay | Admitting: Internal Medicine

## 2015-01-15 ENCOUNTER — Other Ambulatory Visit: Payer: Self-pay | Admitting: Family Medicine

## 2015-01-16 ENCOUNTER — Encounter (INDEPENDENT_AMBULATORY_CARE_PROVIDER_SITE_OTHER): Payer: Self-pay

## 2015-01-16 ENCOUNTER — Ambulatory Visit (INDEPENDENT_AMBULATORY_CARE_PROVIDER_SITE_OTHER): Payer: Commercial Managed Care - HMO | Admitting: Emergency Medicine

## 2015-01-16 ENCOUNTER — Other Ambulatory Visit: Payer: Commercial Managed Care - HMO

## 2015-01-16 ENCOUNTER — Encounter: Payer: Self-pay | Admitting: Emergency Medicine

## 2015-01-16 VITALS — BP 110/68 | HR 64 | Ht 59.0 in | Wt 142.0 lb

## 2015-01-16 DIAGNOSIS — I272 Other secondary pulmonary hypertension: Secondary | ICD-10-CM

## 2015-01-16 DIAGNOSIS — J4520 Mild intermittent asthma, uncomplicated: Secondary | ICD-10-CM

## 2015-01-16 DIAGNOSIS — I270 Primary pulmonary hypertension: Secondary | ICD-10-CM

## 2015-01-16 DIAGNOSIS — I749 Embolism and thrombosis of unspecified artery: Secondary | ICD-10-CM | POA: Diagnosis not present

## 2015-01-16 DIAGNOSIS — J479 Bronchiectasis, uncomplicated: Secondary | ICD-10-CM

## 2015-01-16 DIAGNOSIS — I2721 Secondary pulmonary arterial hypertension: Secondary | ICD-10-CM

## 2015-01-16 NOTE — Patient Instructions (Addendum)
We will perform lab work today Please continue to wear your oxygen at all times Continue your inhaled medications as you have been taking them We will perform a ventilation/perfusion scan of your chest We will increase your Lasix to 40 mg every day until next visit Follow with Dr Delton Coombes in 1 month

## 2015-01-16 NOTE — Assessment & Plan Note (Signed)
CT scan from March 2016 was reviewed. No changes planned

## 2015-01-16 NOTE — Progress Notes (Signed)
Ms. Enderby is a 80 year old woman with a history of mild bronchiectasis and associated mild airflow limitation. She also has allergic rhinitis and postnasal drip. Taking fexofenadine and Rhinocort. Also on Spiriva + QVAR.   ROV 01/23/14 -- bronchiectasis, mild AFL, chronic rhinitis, newly dx exertional hypoxemia. She is on spiriva, loratadine, QVAR. She also has ProAir, she uses it but doesn't seem to benefit. Returns today with continued DOE. She is not on the fluticasone right now.   ROV 05/08/14 -- bronchiectasis, mild AFL, chronic rhinitis, exertional hypoxemia. Etiology of her hypoxemia is unclear > CT scan no PE, no ILD.  She is needing to stop when she exerts herself, even with regular walking. She is on spiriva, just changed from Qvar to symbicort to see if she would benefit. She is on loratadine and rhinocort.   ROV 08/08/14 -- follow up visit for mild AFL, bronchiectasis, cough and rhinitis. Fairly newly identified hypoxemia. Last time we changed back from symbicort to qvar, and changed the spiriva to respimat version. Her insurance co wants to change from proair to ventolin. She is having some trouble turning the new spiriva version. She would like to change back to the capsules.   ROV 12/17/14 -- follow-up visit for history of bronchiectasis, cough allergic rhinitis. She has mild obstructive lung disease based on spirometry from ??.  She has a hx atrial fibrillation and  Cardiomyopathy (improved).  She saw Dr Graciela Husbands recently and she had evidence for some volume overload, also documented exertional hypoxemia (she had a non-qualifying walking oximetry at our last visit). Her lasix was increased with some improvement but she still remains limited. She has asymmetric LE edema, R > L. She has been on coumadin, making DVT less likely.    ROV 01/16/15 -- follow-up visit for dyspnea, cough, and obstructive lung disease. Review of her records indicates that she has mild airflow limitation by spirometry. She's  been noted to have hypoxemia and we confirmed this at her last visit with ambulation.  We performed a lower extremity Doppler study on 12/19/14 which did not show any evidence of DVT. An echocardiogram was performed 12/25/14 that showed preserved left ventricular function with an LVEF of 55-60%, normal ventricular size. The left atrium was mildly dilated as was the right atrium. The study identified significant pulmonary hypertension with an estimated PASP of 66 mmHg. A review of her chart shows that this is significant increase compared with an echocardiogram from 11/29/12 when her peak PA pressure was 38 mmHg, And that she had an ANA that was positive in 2008. She is having exertional dyspnea, especially if O2 runs out. Cough is stable, she is having L >> R LE edema probably more than last visit. She is taking lasix 20mg  alternated w 40mg  every day.     Filed Vitals:   01/16/15 1434  BP: 110/68  Pulse: 64  Height: 4\' 11"  (1.499 m)  Weight: 142 lb (64.411 kg)  SpO2: 93%   Gen: Pleasant, well-nourished, in no distress,  normal affect  ENT: No lesions,  mouth clear,  oropharynx clear, no postnasal drip  Neck: No JVD, no TMG, no carotid bruits  Lungs: No use of accessory muscles, small breaths.   Cardiovascular: RRR, heart sounds normal, no murmur or gallops, asymmetric R> L LE edema.   Musculoskeletal: No deformities, no cyanosis or clubbing  Neuro: alert, non focal  Skin: Warm, no lesions or rashes   11/12/13 --  COMPARISON: CT ANGIO CHEST W/CM &/OR WO/CM dated 07/12/2011  FINDINGS:  Lungs/Pleura: Night No lobar consolidation. Subsegmental atelectasis  in the lingula and left lower lung  High-resolution images demonstrate mild motion degradation,  especially inferiorly. Given this factor, no evidence of  interstitial lung disease. Expiratory images demonstrate minimal  nonspecific air trapping at the bases.  No pleural fluid.  Heart/Mediastinum: The quality of this examination for  evaluation of  pulmonary embolism is good. No evidence of pulmonary embolism.  Aortic and branch vessel atherosclerosis. Mild cardiomegaly.  Multivessel coronary artery atherosclerosis. Multiple small middle  mediastinal nodes. A precarinal node measures 1.4 cm on image 29.  Similar to the prior exam. Mildly prominent but not pathologically  sized hilar nodal tissue bilaterally.  Upper Abdomen: No significant findings.  Bones/Musculoskeletal: Osteopenia.  Review of the MIP images confirms the above findings.  IMPRESSION:  1. No evidence of pulmonary embolism.  2. Cardiomegaly with multivessel coronary artery atherosclerosis.  3. Although the high-resolution images are motion degraded, no  evidence of interstitial lung disease is identified.  4. Mild mediastinal adenopathy with prominent but not pathologically  enlarged hilar nodal tissue bilaterally. Given stability since  07/12/2011, reactive etiologies are favored   Pulmonary hypertension This is a new and evolving problem. Estimated PASP is 66 mmHg compared with 38 prior. Etiology is unclear but is almost certainly contributing to her newly identified hypoxemia and her lower extremity edema. Her edema is asymmetric but her Doppler evaluation of her left leg was negative for clot. I believe she needs a ventilation perfusion scan, autoimmune labs including an ANA, RF, anti-SCL 70. She will likely need a right heart catheterization. I discussed this with her briefly and we will talk more about the timing once her lab work and VQ scans have been returned.  For now I'll increase her Lasix to 40 mg daily   CARDIOMYOPATHY LV dysfunction has resolved although note that she does still have a slightly dilated left atrium   Asthma We will continue her current bronchodilator regimen and her allergy control   BRONCHIECTASIS CT scan from March 2016 was reviewed. No changes planned

## 2015-01-16 NOTE — Assessment & Plan Note (Addendum)
This is a new and evolving problem. Estimated PASP is 66 mmHg compared with 38 prior. Etiology is unclear but is almost certainly contributing to her newly identified hypoxemia and her lower extremity edema. Her edema is asymmetric but her Doppler evaluation of her left leg was negative for clot. I believe she needs a ventilation perfusion scan, autoimmune labs including an ANA, RF, anti-SCL 70. She will likely need a right heart catheterization. I discussed this with her briefly and we will talk more about the timing once her lab work and VQ scans have been returned.  For now I'll increase her Lasix to 40 mg daily

## 2015-01-16 NOTE — Assessment & Plan Note (Signed)
We will continue her current bronchodilator regimen and her allergy control

## 2015-01-16 NOTE — Assessment & Plan Note (Signed)
LV dysfunction has resolved although note that she does still have a slightly dilated left atrium

## 2015-01-17 ENCOUNTER — Ambulatory Visit (HOSPITAL_COMMUNITY)
Admission: RE | Admit: 2015-01-17 | Discharge: 2015-01-17 | Disposition: A | Discharge status: Home / Self Care | Payer: Commercial Managed Care - HMO | Source: Ambulatory Visit | Attending: Emergency Medicine | Admitting: Emergency Medicine

## 2015-01-17 DIAGNOSIS — I749 Embolism and thrombosis of unspecified artery: Secondary | ICD-10-CM

## 2015-01-17 DIAGNOSIS — Z950 Presence of cardiac pacemaker: Secondary | ICD-10-CM | POA: Insufficient documentation

## 2015-01-17 DIAGNOSIS — J479 Bronchiectasis, uncomplicated: Secondary | ICD-10-CM | POA: Diagnosis not present

## 2015-01-17 DIAGNOSIS — R05 Cough: Secondary | ICD-10-CM | POA: Insufficient documentation

## 2015-01-17 DIAGNOSIS — R0602 Shortness of breath: Secondary | ICD-10-CM | POA: Diagnosis not present

## 2015-01-17 DIAGNOSIS — I429 Cardiomyopathy, unspecified: Secondary | ICD-10-CM | POA: Insufficient documentation

## 2015-01-17 LAB — ANA: ANA: POSITIVE — AB

## 2015-01-17 LAB — ANTI-SCLERODERMA ANTIBODY: Scleroderma SCL-70: <0.2 AI (ref 0.0–0.9)

## 2015-01-17 LAB — RHEUMATOID FACTOR: Rhuematoid fact SerPl-aCnc: 49.1 IU/mL — ABNORMAL HIGH (ref 0.0–13.9)

## 2015-01-17 LAB — RNP ANTIBODY: RIBONUCLEIC PROTEIN(ENA) ANTIBODY, IGG: NEGATIVE

## 2015-01-17 MED ORDER — TECHNETIUM TC 99M DIETHYLENETRIAME-PENTAACETIC ACID: 40.0000 | Freq: Once | INTRAVENOUS | Status: AC | PRN

## 2015-01-17 MED ORDER — TECHNETIUM TO 99M ALBUMIN AGGREGATED
6.0000 | Freq: Once | INTRAVENOUS | Status: AC | PRN
  Administered 2015-01-17: 6 via INTRAVENOUS

## 2015-01-29 ENCOUNTER — Telehealth: Payer: Self-pay | Admitting: Emergency Medicine

## 2015-01-29 ENCOUNTER — Ambulatory Visit (INDEPENDENT_AMBULATORY_CARE_PROVIDER_SITE_OTHER): Payer: Commercial Managed Care - HMO | Admitting: *Deleted

## 2015-01-29 DIAGNOSIS — D735 Infarction of spleen: Secondary | ICD-10-CM

## 2015-01-29 DIAGNOSIS — Z5181 Encounter for therapeutic drug level monitoring: Secondary | ICD-10-CM | POA: Diagnosis not present

## 2015-01-29 DIAGNOSIS — I480 Paroxysmal atrial fibrillation: Secondary | ICD-10-CM

## 2015-01-29 DIAGNOSIS — R0609 Other forms of dyspnea: Principal | ICD-10-CM

## 2015-01-29 LAB — POCT INR: INR: 2.9

## 2015-01-29 NOTE — Telephone Encounter (Signed)
Patient can walk about 10-ft and she needs to stop because she gets out of breath even with oxygen on.  Does not use walker, cane, etc.. Patient says that he can push patient in wheelchair.  Patients husband would like to have a wheel chair for patient. Also, requesting a light weight POC with shoulder strap.  RB - ok to order these?  Please advise.

## 2015-01-29 NOTE — Telephone Encounter (Signed)
Patient can walk about 10-ft and she needs to stop because she gets out of breath even with oxygen on.  Does not use walker, cane, etc.. Patient says that he can push patient in wheelchair.  Patients husband would like to have a wheel chair for patient. Also, requesting a light weight POC with shoulder strap.  RB - ok to order these?  Please advise. 

## 2015-01-29 NOTE — Telephone Encounter (Signed)
Closed in error.

## 2015-02-03 ENCOUNTER — Ambulatory Visit (HOSPITAL_BASED_OUTPATIENT_CLINIC_OR_DEPARTMENT_OTHER)
Admission: RE | Admit: 2015-02-03 | Discharge: 2015-02-03 | Disposition: A | Discharge status: Home / Self Care | Payer: Commercial Managed Care - HMO | Source: Ambulatory Visit | Attending: Family Medicine | Admitting: Family Medicine

## 2015-02-03 ENCOUNTER — Ambulatory Visit (INDEPENDENT_AMBULATORY_CARE_PROVIDER_SITE_OTHER): Payer: Commercial Managed Care - HMO | Admitting: Family Medicine

## 2015-02-03 ENCOUNTER — Encounter: Payer: Self-pay | Admitting: Family Medicine

## 2015-02-03 VITALS — BP 114/76 | HR 75 | Temp 97.4°F | Ht 59.0 in | Wt 140.6 lb

## 2015-02-03 DIAGNOSIS — Z0000 Encounter for general adult medical examination without abnormal findings: Secondary | ICD-10-CM

## 2015-02-03 DIAGNOSIS — Z1239 Encounter for other screening for malignant neoplasm of breast: Secondary | ICD-10-CM | POA: Diagnosis not present

## 2015-02-03 DIAGNOSIS — E785 Hyperlipidemia, unspecified: Secondary | ICD-10-CM

## 2015-02-03 DIAGNOSIS — Z23 Encounter for immunization: Secondary | ICD-10-CM

## 2015-02-03 DIAGNOSIS — E2839 Other primary ovarian failure: Secondary | ICD-10-CM | POA: Diagnosis not present

## 2015-02-03 DIAGNOSIS — I10 Essential (primary) hypertension: Secondary | ICD-10-CM | POA: Diagnosis not present

## 2015-02-03 DIAGNOSIS — Z1231 Encounter for screening mammogram for malignant neoplasm of breast: Secondary | ICD-10-CM | POA: Insufficient documentation

## 2015-02-03 LAB — LIPID PANEL
Cholesterol: 96 mg/dL (ref 0–200)
HDL: 26.3 mg/dL — ABNORMAL LOW (ref >39.00)
LDL CALC: 53 mg/dL (ref 0–99)
NonHDL: 69.7
Total CHOL/HDL Ratio: 4
Triglycerides: 83 mg/dL (ref 0.0–149.0)
VLDL: 16.6 mg/dL (ref 0.0–40.0)

## 2015-02-03 LAB — CBC WITH DIFFERENTIAL/PLATELET
BASOS PCT: 0.3 % (ref 0.0–3.0)
Basophils Absolute: 0 10*3/uL (ref 0.0–0.1)
EOS PCT: 3.7 % (ref 0.0–5.0)
Eosinophils Absolute: 0.3 10*3/uL (ref 0.0–0.7)
HEMATOCRIT: 39.1 % (ref 36.0–46.0)
HEMOGLOBIN: 13.3 g/dL (ref 12.0–15.0)
LYMPHS ABS: 1.3 10*3/uL (ref 0.7–4.0)
Lymphocytes Relative: 15.6 % (ref 12.0–46.0)
MCHC: 34 g/dL (ref 30.0–36.0)
MCV: 87.1 fl (ref 78.0–100.0)
MONO ABS: 0.8 10*3/uL (ref 0.1–1.0)
Monocytes Relative: 9.8 % (ref 3.0–12.0)
Neutro Abs: 6 10*3/uL (ref 1.4–7.7)
Neutrophils Relative %: 70.6 % (ref 43.0–77.0)
Platelets: 314 10*3/uL (ref 150.0–400.0)
RBC: 4.49 Mil/uL (ref 3.87–5.11)
RDW: 15.3 % (ref 11.5–15.5)
WBC: 8.6 10*3/uL (ref 4.0–10.5)

## 2015-02-03 LAB — HEPATIC FUNCTION PANEL
ALT: 44 U/L — ABNORMAL HIGH (ref 0–35)
AST: 48 U/L — AB (ref 0–37)
Albumin: 3.6 g/dL (ref 3.5–5.2)
Alkaline Phosphatase: 109 U/L (ref 39–117)
Bilirubin, Direct: 0.3 mg/dL (ref 0.0–0.3)
Total Bilirubin: 0.8 mg/dL (ref 0.2–1.2)
Total Protein: 7 g/dL (ref 6.0–8.3)

## 2015-02-03 LAB — BASIC METABOLIC PANEL
BUN: 13 mg/dL (ref 6–23)
CHLORIDE: 99 meq/L (ref 96–112)
CO2: 23 mEq/L (ref 19–32)
Calcium: 9.3 mg/dL (ref 8.4–10.5)
Creatinine, Ser: 0.61 mg/dL (ref 0.40–1.20)
GFR: 100.9 mL/min (ref >60.00)
Glucose, Bld: 92 mg/dL (ref 70–99)
Potassium: 3.9 mEq/L (ref 3.5–5.1)
Sodium: 129 mEq/L — ABNORMAL LOW (ref 135–145)

## 2015-02-03 NOTE — Progress Notes (Signed)
Pre visit review using our clinic review tool, if applicable. No additional management support is needed unless otherwise documented below in the visit note. 

## 2015-02-03 NOTE — Telephone Encounter (Signed)
Order has been placed per RB. Pt's husband is aware. Nothing further was needed.

## 2015-02-03 NOTE — Addendum Note (Signed)
Addended by: Arnette Norris on: 02/03/2015 01:30 PM   Modules accepted: Orders

## 2015-02-03 NOTE — Progress Notes (Signed)
Subjective:   [name] is a 80 y.o. female who presents for Medicare Annual (Subsequent) preventive examination.  Review of Systems:   Review of Systems  Constitutional: Negative for activity change, appetite change and fatigue.  HENT: Negative for hearing loss, congestion, tinnitus and ear discharge.   Eyes: Negative for visual disturbance (see optho q1y -- vision corrected to 20/20 with glasses).  Respiratory: Negative for cough, chest tightness  + sob-- on O2 Cardiovascular: Negative for chest pain, palpitations  + leg swelling Gastrointestinal: Negative for abdominal pain, diarrhea, constipation and abdominal distention.  Genitourinary: Negative for urgency, frequency, decreased urine volume and difficulty urinating.  Musculoskeletal: Negative for back pain, arthralgias and gait problem.  Skin: Negative for color change, pallor and rash.  Neurological: Negative for dizziness, light-headedness, numbness and headaches.  Hematological: Negative for adenopathy. Does not bruise/bleed easily.  Psychiatric/Behavioral: Negative for suicidal ideas, confusion, sleep disturbance, self-injury, dysphoric mood, decreased concentration and agitation.  Pt is able to read and write and can do all ADLs No risk for falling No abuse/ violence in home           Objective:     Vitals: BP 114/76 mmHg  Pulse 75  Temp(Src) 97.4 F (36.3 C) (Oral)  Ht  (1.499 m)  Wt 140 lb 9.6 oz (63.776 kg)  BMI 28.38 kg/m2  SpO2 86% BP 114/76 mmHg  Pulse 75  Temp(Src) 97.4 F (36.3 C) (Oral)  Ht  (1.499 m)  Wt 140 lb 9.6 oz (63.776 kg)  BMI 28.38 kg/m2  SpO2 86% General appearance: alert, cooperative, appears stated age and no distress Head: Normocephalic, without obvious abnormality, atraumatic Eyes: conjunctivae/corneas clear. PERRL, EOM's intact. Fundi benign. Ears: normal TM's and external ear canals both ears Nose: Nares normal. Septum midline. Mucosa normal. No drainage or  sinus tenderness. Throat: lips, mucosa, and tongue normal; teeth and gums normal Neck: no adenopathy, no carotid bruit, no JVD, supple, symmetrical, trachea midline and thyroid not enlarged, symmetric, no tenderness/mass/nodules Back: symmetric, no curvature. ROM normal. No CVA tenderness. Lungs: rhonchi bilaterally Breasts: normal appearance, no masses or tenderness Heart: regular rate and rhythm, S1, S2 normal, no murmur, click, rub or gallop Abdomen: soft, non-tender; bowel sounds normal; no masses,  no organomegaly Pelvic: not indicated; post-menopausal, no abnormal Pap smears in past Extremities: edema L > R pitting--pt did not talk diuretic today Pulses: 2+ and symmetric Skin: Skin color, texture, turgor normal. No rashes or lesions Lymph nodes: Cervical, supraclavicular, and axillary nodes normal. Neurologic: Alert and oriented X 3, normal strength and tone. Normal symmetric reflexes. Normal coordination and gait Psych- no depression, no anxiety Tobacco History  Smoking status  . Never Smoker   Smokeless tobacco  . Never Used     Counseling given: Not Answered   Past Medical History  Diagnosis Date  . Asthma   . Rhinitis   . Osteoarthritis   . Osteoporosis   . Peptic ulcer disease   . Depression   . Atrial fibrillation     s/p ablation   . Takotsubo cardiomyopathy     resolved  . Shingles   . Eosinophilia   . Sinoatrial node dysfunction   . GERD (gastroesophageal reflux disease)   . Bronchiectasis   . Peptic ulcer disease   . Osteoarthritis   . Herpes zoster   . Hyperlipemia   . Injury of splenic artery     infarct   Past Surgical History  Procedure Laterality Date  .  Cholecystectomy    . Tonsillectomy    . Cataract extraction    . Pacemaker insertion  09/29/06    MDT ADDR01 dual chamber pacemaker implanted for SSS - programmed VVIR 2/2 permanent atrial fibrillation  . Cosmetic surgery     Family History  Problem Relation Age of Onset  . Coronary  artery disease      femal 1st degree relative  . Liver cancer Son   . Emphysema Sister   . Asthma Mother   . Depression Father     nervous breakdown   History  Sexual Activity  . Sexual Activity:  . Partners: Male    Outpatient Encounter Prescriptions as of 02/03/2015  Medication Sig  . albuterol (PROAIR HFA) 108 (90 BASE) MCG/ACT inhaler Inhale 2 puffs into the lungs every 4 (four) hours as needed for wheezing or shortness of breath.  . Ascorbic Acid (VITAMIN C) 1000 MG tablet Take 1,000 mg by mouth daily.    [name] atorvastatin (LIPITOR) 10 MG tablet TAKE 1 TABLET EVERY DAY  . beclomethasone (QVAR) 80 MCG/ACT inhaler Inhale 2 puffs into the lungs 2 (two) times daily.  [name] BEPREVE 1.5 % SOLN at bedtime.   . budesonide (RHINOCORT AQUA) 32 MCG/ACT nasal spray Place 1 spray into both nostrils daily.  . Calcium Carbonate (CALCIUM 600) 1500 MG TABS Take 1 tablet by mouth daily.    . Cholecalciferol (VITAMIN D3) 1000 UNITS CAPS Take 1 capsule by mouth daily.    [name] escitalopram (LEXAPRO) 10 MG tablet TAKE 1 TABLET (10 MG TOTAL) BY MOUTH DAILY.  [name] esomeprazole (NEXIUM) 40 MG capsule TAKE ONE CAPSULE BY MOUTH EVERY DAY BEFORE BREAKFAST  . fluticasone (FLONASE) 50 MCG/ACT nasal spray Place 2 sprays into both nostrils 2 (two) times daily.  . furosemide (LASIX) 20 MG tablet Take as directed.  . loratadine (CLARITIN) 10 MG tablet Take 10 mg by mouth daily.  . Multiple Vitamin (MULTIVITAMIN) tablet Take 1 tablet by mouth daily.    [name] tiotropium (SPIRIVA) 18 MCG inhalation capsule Place 1 capsule (18 mcg total) into inhaler and inhale daily.  . traZODone (DESYREL) 100 MG tablet TAKE 2 TABLETS BY MOUTH AT BEDTIME  . warfarin (COUMADIN) 2.5 MG tablet Take as directed by [facility]   No facility-administered encounter medications on file as of 02/03/2015.    Activities of Daily Living In your present state of health, do you have any difficulty performing the following activities: 02/03/2015 02/03/2015    Hearing? N N  Vision? N N  Difficulty concentrating or making decisions? N N  Walking or climbing stairs? N Y  Dressing or bathing? N Y  Doing errands, shopping? [name]    Patient Care Team: Lelon Perla, DO as PCP - General Mateo Flow, MD as Consulting Physician (Ophthalmology) Leslye Peer, MD as Consulting Physician (Pulmonary Disease) Duke Salvia, MD as Consulting Physician (Cardiology)    Assessment:    CPE Exercise Activities and Dietary recommendation--- difficult to exercise secondary to O2    Goals    None     Fall Risk Fall Risk  02/03/2015 02/03/2015 07/09/2013 07/09/2013  Falls in the past year? No No No No   Depression Screen PHQ 2/9 Scores 02/03/2015 02/03/2015 07/09/2013 07/09/2013  PHQ - 2 Score 0 0 0 0     Cognitive Testing AAOx3 nad  Immunization History  Administered Date(s) Administered  . H1N1 09/05/2008  . Influenza Split 05/12/2011, 07/06/2012  . Influenza Whole 06/27/2007, 05/29/2008, 05/23/2009, 05/05/2010  .  Influenza,inj,Quad PF,36+ Mos 06/27/2013, 05/08/2014  . Pneumococcal Polysaccharide-23 05/23/2007, 07/09/2013  . Tdap 05/12/2011  . Zoster 06/01/2011   Screening Tests Health Maintenance  Topic Date Due  . MAMMOGRAM  09/04/2013  . PNA vac Low Risk Adult (2 of 2 - PCV13) 07/09/2014  . INFLUENZA VACCINE  03/24/2015  . COLONOSCOPY  07/06/2016  . TETANUS/TDAP  05/11/2021  . DEXA SCAN  Completed  . ZOSTAVAX  Completed      Plan:    see AVS During the course of the visit the patient was educated and counseled about the following appropriate screening and preventive services:   Vaccines to include Pneumoccal, Influenza, Hepatitis B, Td, Zostavax, HCV  Electrocardiogram  Cardiovascular Disease  Colorectal cancer screening  Bone density screening  Diabetes screening  Glaucoma screening  Mammography/PAP  Nutrition counseling   Patient Instructions (the written plan) was given to the patient.  1. Medicare annual  wellness visit, subsequent See AVS Check labs ghm utd prevnar given  2. Estrogen deficiency  - DG Bone Density; Future  3. Breast cancer screening  - MM DIGITAL SCREENING BILATERAL; Future  4. Essential hypertension  - Basic metabolic panel - CBC with Differential/Platelet - POCT urinalysis dipstick  5. Hyperlipidemia  - Hepatic function panel - Lipid panel - POCT urinalysis dipstick  6. Routine history and physical examination of adult   Loreen Freud, DO  02/03/2015

## 2015-02-03 NOTE — Patient Instructions (Signed)
Preventive Care for Adults A healthy lifestyle and preventive care can promote health and wellness. Preventive health guidelines for women include the following key practices.  A routine yearly physical is a good way to check with your health care provider about your health and preventive screening. It is a chance to share any concerns and updates on your health and to receive a thorough exam.  Visit your dentist for a routine exam and preventive care every 6 months. Brush your teeth twice a day and floss once a day. Good oral hygiene prevents tooth decay and gum disease.  The frequency of eye exams is based on your age, health, family medical history, use of contact lenses, and other factors. Follow your health care provider's recommendations for frequency of eye exams.  Eat a healthy diet. Foods like vegetables, fruits, whole grains, low-fat dairy products, and lean protein foods contain the nutrients you need without too many calories. Decrease your intake of foods high in solid fats, added sugars, and salt. Eat the right amount of calories for you.Get information about a proper diet from your health care provider, if necessary.  Regular physical exercise is one of the most important things you can do for your health. Most adults should get at least 150 minutes of moderate-intensity exercise (any activity that increases your heart rate and causes you to sweat) each week. In addition, most adults need muscle-strengthening exercises on 2 or more days a week.  Maintain a healthy weight. The body mass index (BMI) is a screening tool to identify possible weight problems. It provides an estimate of body fat based on height and weight. Your health care provider can find your BMI and can help you achieve or maintain a healthy weight.For adults 20 years and older:  A BMI below 18.5 is considered underweight.  A BMI of 18.5 to 24.9 is normal.  A BMI of 25 to 29.9 is considered overweight.  A BMI of  30 and above is considered obese.  Maintain normal blood lipids and cholesterol levels by exercising and minimizing your intake of saturated fat. Eat a balanced diet with plenty of fruit and vegetables. Blood tests for lipids and cholesterol should begin at age 76 and be repeated every 5 years. If your lipid or cholesterol levels are high, you are over 50, or you are at high risk for heart disease, you may need your cholesterol levels checked more frequently.Ongoing high lipid and cholesterol levels should be treated with medicines if diet and exercise are not working.  If you smoke, find out from your health care provider how to quit. If you do not use tobacco, do not start.  Lung cancer screening is recommended for adults aged 22-80 years who are at high risk for developing lung cancer because of a history of smoking. A yearly low-dose CT scan of the lungs is recommended for people who have at least a 30-pack-year history of smoking and are a current smoker or have quit within the past 15 years. A pack year of smoking is smoking an average of 1 pack of cigarettes a day for 1 year (for example: 1 pack a day for 30 years or 2 packs a day for 15 years). Yearly screening should continue until the smoker has stopped smoking for at least 15 years. Yearly screening should be stopped for people who develop a health problem that would prevent them from having lung cancer treatment.  If you are pregnant, do not drink alcohol. If you are breastfeeding,  be very cautious about drinking alcohol. If you are not pregnant and choose to drink alcohol, do not have more than 1 drink per day. One drink is considered to be 12 ounces (355 mL) of beer, 5 ounces (148 mL) of wine, or 1.5 ounces (44 mL) of liquor.  Avoid use of street drugs. Do not share needles with anyone. Ask for help if you need support or instructions about stopping the use of drugs.  High blood pressure causes heart disease and increases the risk of  stroke. Your blood pressure should be checked at least every 1 to 2 years. Ongoing high blood pressure should be treated with medicines if weight loss and exercise do not work.  If you are 75-52 years old, ask your health care provider if you should take aspirin to prevent strokes.  Diabetes screening involves taking a blood sample to check your fasting blood sugar level. This should be done once every 3 years, after age 15, if you are within normal weight and without risk factors for diabetes. Testing should be considered at a younger age or be carried out more frequently if you are overweight and have at least 1 risk factor for diabetes.  Breast cancer screening is essential preventive care for women. You should practice "breast self-awareness." This means understanding the normal appearance and feel of your breasts and may include breast self-examination. Any changes detected, no matter how small, should be reported to a health care provider. Women in their 58s and 30s should have a clinical breast exam (CBE) by a health care provider as part of a regular health exam every 1 to 3 years. After age 16, women should have a CBE every year. Starting at age 53, women should consider having a mammogram (breast X-ray test) every year. Women who have a family history of breast cancer should talk to their health care provider about genetic screening. Women at a high risk of breast cancer should talk to their health care providers about having an MRI and a mammogram every year.  Breast cancer gene (BRCA)-related cancer risk assessment is recommended for women who have family members with BRCA-related cancers. BRCA-related cancers include breast, ovarian, tubal, and peritoneal cancers. Having family members with these cancers may be associated with an increased risk for harmful changes (mutations) in the breast cancer genes BRCA1 and BRCA2. Results of the assessment will determine the need for genetic counseling and  BRCA1 and BRCA2 testing.  Routine pelvic exams to screen for cancer are no longer recommended for nonpregnant women who are considered low risk for cancer of the pelvic organs (ovaries, uterus, and vagina) and who do not have symptoms. Ask your health care provider if a screening pelvic exam is right for you.  If you have had past treatment for cervical cancer or a condition that could lead to cancer, you need Pap tests and screening for cancer for at least 20 years after your treatment. If Pap tests have been discontinued, your risk factors (such as having a new sexual partner) need to be reassessed to determine if screening should be resumed. Some women have medical problems that increase the chance of getting cervical cancer. In these cases, your health care provider may recommend more frequent screening and Pap tests.  The HPV test is an additional test that may be used for cervical cancer screening. The HPV test looks for the virus that can cause the cell changes on the cervix. The cells collected during the Pap test can be  tested for HPV. The HPV test could be used to screen women aged 30 years and older, and should be used in women of any age who have unclear Pap test results. After the age of 30, women should have HPV testing at the same frequency as a Pap test.  Colorectal cancer can be detected and often prevented. Most routine colorectal cancer screening begins at the age of 50 years and continues through age 75 years. However, your health care provider may recommend screening at an earlier age if you have risk factors for colon cancer. On a yearly basis, your health care provider may provide home test kits to check for hidden blood in the stool. Use of a small camera at the end of a tube, to directly examine the colon (sigmoidoscopy or colonoscopy), can detect the earliest forms of colorectal cancer. Talk to your health care provider about this at age 50, when routine screening begins. Direct  exam of the colon should be repeated every 5-10 years through age 75 years, unless early forms of pre-cancerous polyps or small growths are found.  People who are at an increased risk for hepatitis B should be screened for this virus. You are considered at high risk for hepatitis B if:  You were born in a country where hepatitis B occurs often. Talk with your health care provider about which countries are considered high risk.  Your parents were born in a high-risk country and you have not received a shot to protect against hepatitis B (hepatitis B vaccine).  You have HIV or AIDS.  You use needles to inject street drugs.  You live with, or have sex with, someone who has hepatitis B.  You get hemodialysis treatment.  You take certain medicines for conditions like cancer, organ transplantation, and autoimmune conditions.  Hepatitis C blood testing is recommended for all people born from 1945 through 1965 and any individual with known risks for hepatitis C.  Practice safe sex. Use condoms and avoid high-risk sexual practices to reduce the spread of sexually transmitted infections (STIs). STIs include gonorrhea, chlamydia, syphilis, trichomonas, herpes, HPV, and human immunodeficiency virus (HIV). Herpes, HIV, and HPV are viral illnesses that have no cure. They can result in disability, cancer, and death.  You should be screened for sexually transmitted illnesses (STIs) including gonorrhea and chlamydia if:  You are sexually active and are younger than 24 years.  You are older than 24 years and your health care provider tells you that you are at risk for this type of infection.  Your sexual activity has changed since you were last screened and you are at an increased risk for chlamydia or gonorrhea. Ask your health care provider if you are at risk.  If you are at risk of being infected with HIV, it is recommended that you take a prescription medicine daily to prevent HIV infection. This is  called preexposure prophylaxis (PrEP). You are considered at risk if:  You are a heterosexual woman, are sexually active, and are at increased risk for HIV infection.  You take drugs by injection.  You are sexually active with a partner who has HIV.  Talk with your health care provider about whether you are at high risk of being infected with HIV. If you choose to begin PrEP, you should first be tested for HIV. You should then be tested every 3 months for as long as you are taking PrEP.  Osteoporosis is a disease in which the bones lose minerals and strength   with aging. This can result in serious bone fractures or breaks. The risk of osteoporosis can be identified using a bone density scan. Women ages 65 years and over and women at risk for fractures or osteoporosis should discuss screening with their health care providers. Ask your health care provider whether you should take a calcium supplement or vitamin D to reduce the rate of osteoporosis.  Menopause can be associated with physical symptoms and risks. Hormone replacement therapy is available to decrease symptoms and risks. You should talk to your health care provider about whether hormone replacement therapy is right for you.  Use sunscreen. Apply sunscreen liberally and repeatedly throughout the day. You should seek shade when your shadow is shorter than you. Protect yourself by wearing long sleeves, pants, a wide-brimmed hat, and sunglasses year round, whenever you are outdoors.  Once a month, do a whole body skin exam, using a mirror to look at the skin on your back. Tell your health care provider of new moles, moles that have irregular borders, moles that are larger than a pencil eraser, or moles that have changed in shape or color.  Stay current with required vaccines (immunizations).  Influenza vaccine. All adults should be immunized every year.  Tetanus, diphtheria, and acellular pertussis (Td, Tdap) vaccine. Pregnant women should  receive 1 dose of Tdap vaccine during each pregnancy. The dose should be obtained regardless of the length of time since the last dose. Immunization is preferred during the 27th-36th week of gestation. An adult who has not previously received Tdap or who does not know her vaccine status should receive 1 dose of Tdap. This initial dose should be followed by tetanus and diphtheria toxoids (Td) booster doses every 10 years. Adults with an unknown or incomplete history of completing a 3-dose immunization series with Td-containing vaccines should begin or complete a primary immunization series including a Tdap dose. Adults should receive a Td booster every 10 years.  Varicella vaccine. An adult without evidence of immunity to varicella should receive 2 doses or a second dose if she has previously received 1 dose. Pregnant females who do not have evidence of immunity should receive the first dose after pregnancy. This first dose should be obtained before leaving the health care facility. The second dose should be obtained 4-8 weeks after the first dose.  Human papillomavirus (HPV) vaccine. Females aged 13-26 years who have not received the vaccine previously should obtain the 3-dose series. The vaccine is not recommended for use in pregnant females. However, pregnancy testing is not needed before receiving a dose. If a female is found to be pregnant after receiving a dose, no treatment is needed. In that case, the remaining doses should be delayed until after the pregnancy. Immunization is recommended for any person with an immunocompromised condition through the age of 26 years if she did not get any or all doses earlier. During the 3-dose series, the second dose should be obtained 4-8 weeks after the first dose. The third dose should be obtained 24 weeks after the first dose and 16 weeks after the second dose.  Zoster vaccine. One dose is recommended for adults aged 60 years or older unless certain conditions are  present.  Measles, mumps, and rubella (MMR) vaccine. Adults born before 1957 generally are considered immune to measles and mumps. Adults born in 1957 or later should have 1 or more doses of MMR vaccine unless there is a contraindication to the vaccine or there is laboratory evidence of immunity to   each of the three diseases. A routine second dose of MMR vaccine should be obtained at least 28 days after the first dose for students attending postsecondary schools, health care workers, or international travelers. People who received inactivated measles vaccine or an unknown type of measles vaccine during 1963-1967 should receive 2 doses of MMR vaccine. People who received inactivated mumps vaccine or an unknown type of mumps vaccine before 1979 and are at high risk for mumps infection should consider immunization with 2 doses of MMR vaccine. For females of childbearing age, rubella immunity should be determined. If there is no evidence of immunity, females who are not pregnant should be vaccinated. If there is no evidence of immunity, females who are pregnant should delay immunization until after pregnancy. Unvaccinated health care workers born before 1957 who lack laboratory evidence of measles, mumps, or rubella immunity or laboratory confirmation of disease should consider measles and mumps immunization with 2 doses of MMR vaccine or rubella immunization with 1 dose of MMR vaccine.  Pneumococcal 13-valent conjugate (PCV13) vaccine. When indicated, a person who is uncertain of her immunization history and has no record of immunization should receive the PCV13 vaccine. An adult aged 19 years or older who has certain medical conditions and has not been previously immunized should receive 1 dose of PCV13 vaccine. This PCV13 should be followed with a dose of pneumococcal polysaccharide (PPSV23) vaccine. The PPSV23 vaccine dose should be obtained at least 8 weeks after the dose of PCV13 vaccine. An adult aged 19  years or older who has certain medical conditions and previously received 1 or more doses of PPSV23 vaccine should receive 1 dose of PCV13. The PCV13 vaccine dose should be obtained 1 or more years after the last PPSV23 vaccine dose.  Pneumococcal polysaccharide (PPSV23) vaccine. When PCV13 is also indicated, PCV13 should be obtained first. All adults aged 65 years and older should be immunized. An adult younger than age 65 years who has certain medical conditions should be immunized. Any person who resides in a nursing home or long-term care facility should be immunized. An adult smoker should be immunized. People with an immunocompromised condition and certain other conditions should receive both PCV13 and PPSV23 vaccines. People with human immunodeficiency virus (HIV) infection should be immunized as soon as possible after diagnosis. Immunization during chemotherapy or radiation therapy should be avoided. Routine use of PPSV23 vaccine is not recommended for American Indians, [place] Natives, or people younger than 65 years unless there are medical conditions that require PPSV23 vaccine. When indicated, people who have unknown immunization and have no record of immunization should receive PPSV23 vaccine. One-time revaccination 5 years after the first dose of PPSV23 is recommended for people aged 19-64 years who have chronic kidney failure, nephrotic syndrome, asplenia, or immunocompromised conditions. People who received 1-2 doses of PPSV23 before age 65 years should receive another dose of PPSV23 vaccine at age 65 years or later if at least 5 years have passed since the previous dose. Doses of PPSV23 are not needed for people immunized with PPSV23 at or after age 65 years.  Meningococcal vaccine. Adults with asplenia or persistent complement component deficiencies should receive 2 doses of quadrivalent meningococcal conjugate (MenACWY-D) vaccine. The doses should be obtained at least 2 months apart.  Microbiologists working with certain meningococcal bacteria, military recruits, people at risk during an outbreak, and people who travel to or live in countries with a high rate of meningitis should be immunized. A first-year college student up through age   21 years who is living in a residence hall should receive a dose if she did not receive a dose on or after her 16th birthday. Adults who have certain high-risk conditions should receive one or more doses of vaccine.  Hepatitis A vaccine. Adults who wish to be protected from this disease, have certain high-risk conditions, work with hepatitis A-infected animals, work in hepatitis A research labs, or travel to or work in countries with a high rate of hepatitis A should be immunized. Adults who were previously unvaccinated and who anticipate close contact with an international adoptee during the first 60 days after arrival in the [place] from a country with a high rate of hepatitis A should be immunized.  Hepatitis B vaccine. Adults who wish to be protected from this disease, have certain high-risk conditions, may be exposed to blood or other infectious body fluids, are household contacts or sex partners of hepatitis B positive people, are clients or workers in certain care facilities, or travel to or work in countries with a high rate of hepatitis B should be immunized.  Haemophilus influenzae type b (Hib) vaccine. A previously unvaccinated person with asplenia or sickle cell disease or having a scheduled splenectomy should receive 1 dose of Hib vaccine. Regardless of previous immunization, a recipient of a hematopoietic stem cell transplant should receive a 3-dose series 6-12 months after her successful transplant. Hib vaccine is not recommended for adults with HIV infection. Preventive Services / Frequency Ages 64 to 68 years  Blood pressure check.** / Every 1 to 2 years.  Lipid and cholesterol check.** / Every 5 years beginning at age  22.  Clinical breast exam.** / Every 3 years for women in their 88s and 53s.  BRCA-related cancer risk assessment.** / For women who have family members with a BRCA-related cancer (breast, ovarian, tubal, or peritoneal cancers).  Pap test.** / Every 2 years from ages 90 through 51. Every 3 years starting at age 21 through age 56 or 3 with a history of 3 consecutive normal Pap tests.  HPV screening.** / Every 3 years from ages 24 through ages 1 to 46 with a history of 3 consecutive normal Pap tests.  Hepatitis C blood test.** / For any individual with known risks for hepatitis C.  Skin self-exam. / Monthly.  Influenza vaccine. / Every year.  Tetanus, diphtheria, and acellular pertussis (Tdap, Td) vaccine.** / Consult your health care provider. Pregnant women should receive 1 dose of Tdap vaccine during each pregnancy. 1 dose of Td every 10 years.  Varicella vaccine.** / Consult your health care provider. Pregnant females who do not have evidence of immunity should receive the first dose after pregnancy.  HPV vaccine. / 3 doses over 6 months, if 72 and younger. The vaccine is not recommended for use in pregnant females. However, pregnancy testing is not needed before receiving a dose.  Measles, mumps, rubella (MMR) vaccine.** / You need at least 1 dose of MMR if you were born in 1957 or later. You may also need a 2nd dose. For females of childbearing age, rubella immunity should be determined. If there is no evidence of immunity, females who are not pregnant should be vaccinated. If there is no evidence of immunity, females who are pregnant should delay immunization until after pregnancy.  Pneumococcal 13-valent conjugate (PCV13) vaccine.** / Consult your health care provider.  Pneumococcal polysaccharide (PPSV23) vaccine.** / 1 to 2 doses if you smoke cigarettes or if you have certain conditions.  Meningococcal vaccine.** /  1 dose if you are age 19 to 21 years and a first-year college  student living in a residence hall, or have one of several medical conditions, you need to get vaccinated against meningococcal disease. You may also need additional booster doses.  Hepatitis A vaccine.** / Consult your health care provider.  Hepatitis B vaccine.** / Consult your health care provider.  Haemophilus influenzae type b (Hib) vaccine.** / Consult your health care provider. Ages 40 to 64 years  Blood pressure check.** / Every 1 to 2 years.  Lipid and cholesterol check.** / Every 5 years beginning at age 20 years.  Lung cancer screening. / Every year if you are aged 55-80 years and have a 30-pack-year history of smoking and currently smoke or have quit within the past 15 years. Yearly screening is stopped once you have quit smoking for at least 15 years or develop a health problem that would prevent you from having lung cancer treatment.  Clinical breast exam.** / Every year after age 40 years.  BRCA-related cancer risk assessment.** / For women who have family members with a BRCA-related cancer (breast, ovarian, tubal, or peritoneal cancers).  Mammogram.** / Every year beginning at age 40 years and continuing for as long as you are in good health. Consult with your health care provider.  Pap test.** / Every 3 years starting at age 30 years through age 65 or 70 years with a history of 3 consecutive normal Pap tests.  HPV screening.** / Every 3 years from ages 30 years through ages 65 to 70 years with a history of 3 consecutive normal Pap tests.  Fecal occult blood test (FOBT) of stool. / Every year beginning at age 50 years and continuing until age 75 years. You may not need to do this test if you get a colonoscopy every 10 years.  Flexible sigmoidoscopy or colonoscopy.** / Every 5 years for a flexible sigmoidoscopy or every 10 years for a colonoscopy beginning at age 50 years and continuing until age 75 years.  Hepatitis C blood test.** / For all people born from 1945 through  1965 and any individual with known risks for hepatitis C.  Skin self-exam. / Monthly.  Influenza vaccine. / Every year.  Tetanus, diphtheria, and acellular pertussis (Tdap/Td) vaccine.** / Consult your health care provider. Pregnant women should receive 1 dose of Tdap vaccine during each pregnancy. 1 dose of Td every 10 years.  Varicella vaccine.** / Consult your health care provider. Pregnant females who do not have evidence of immunity should receive the first dose after pregnancy.  Zoster vaccine.** / 1 dose for adults aged 60 years or older.  Measles, mumps, rubella (MMR) vaccine.** / You need at least 1 dose of MMR if you were born in 1957 or later. You may also need a 2nd dose. For females of childbearing age, rubella immunity should be determined. If there is no evidence of immunity, females who are not pregnant should be vaccinated. If there is no evidence of immunity, females who are pregnant should delay immunization until after pregnancy.  Pneumococcal 13-valent conjugate (PCV13) vaccine.** / Consult your health care provider.  Pneumococcal polysaccharide (PPSV23) vaccine.** / 1 to 2 doses if you smoke cigarettes or if you have certain conditions.  Meningococcal vaccine.** / Consult your health care provider.  Hepatitis A vaccine.** / Consult your health care provider.  Hepatitis B vaccine.** / Consult your health care provider.  Haemophilus influenzae type b (Hib) vaccine.** / Consult your health care provider. Ages 65   years and over  Blood pressure check.** / Every 1 to 2 years.  Lipid and cholesterol check.** / Every 5 years beginning at age 22 years.  Lung cancer screening. / Every year if you are aged 73-80 years and have a 30-pack-year history of smoking and currently smoke or have quit within the past 15 years. Yearly screening is stopped once you have quit smoking for at least 15 years or develop a health problem that would prevent you from having lung cancer  treatment.  Clinical breast exam.** / Every year after age 4 years.  BRCA-related cancer risk assessment.** / For women who have family members with a BRCA-related cancer (breast, ovarian, tubal, or peritoneal cancers).  Mammogram.** / Every year beginning at age 40 years and continuing for as long as you are in good health. Consult with your health care provider.  Pap test.** / Every 3 years starting at age 9 years through age 34 or 91 years with 3 consecutive normal Pap tests. Testing can be stopped between 65 and 70 years with 3 consecutive normal Pap tests and no abnormal Pap or HPV tests in the past 10 years.  HPV screening.** / Every 3 years from ages 57 years through ages 64 or 45 years with a history of 3 consecutive normal Pap tests. Testing can be stopped between 65 and 70 years with 3 consecutive normal Pap tests and no abnormal Pap or HPV tests in the past 10 years.  Fecal occult blood test (FOBT) of stool. / Every year beginning at age 15 years and continuing until age 17 years. You may not need to do this test if you get a colonoscopy every 10 years.  Flexible sigmoidoscopy or colonoscopy.** / Every 5 years for a flexible sigmoidoscopy or every 10 years for a colonoscopy beginning at age 86 years and continuing until age 71 years.  Hepatitis C blood test.** / For all people born from [birth year] through 1963 and any individual with known risks for hepatitis C.  Osteoporosis screening.** / A one-time screening for women ages 83 years and over and women at risk for fractures or osteoporosis.  Skin self-exam. / Monthly.  Influenza vaccine. / Every year.  Tetanus, diphtheria, and acellular pertussis (Tdap/Td) vaccine.** / 1 dose of Td every 10 years.  Varicella vaccine.** / Consult your health care provider.  Zoster vaccine.** / 1 dose for adults aged 61 years or older.  Pneumococcal 13-valent conjugate (PCV13) vaccine.** / Consult your health care provider.  Pneumococcal  polysaccharide (PPSV23) vaccine.** / 1 dose for all adults aged 28 years and older.  Meningococcal vaccine.** / Consult your health care provider.  Hepatitis A vaccine.** / Consult your health care provider.  Hepatitis B vaccine.** / Consult your health care provider.  Haemophilus influenzae type b (Hib) vaccine.** / Consult your health care provider. ** Family history and personal history of risk and conditions may change your health care provider's recommendations. Document Released: 10/05/2001 Document Revised: 12/24/2013 Document Reviewed: 01/04/2011 [facility] Patient Information 2015 [place], [place]. This information is not intended to replace advice given to you by your health care provider. Make sure you discuss any questions you have with your health care provider.

## 2015-02-03 NOTE — Telephone Encounter (Signed)
Yes OK to order both

## 2015-02-05 ENCOUNTER — Telehealth: Payer: Self-pay | Admitting: *Deleted

## 2015-02-05 DIAGNOSIS — R945 Abnormal results of liver function studies: Secondary | ICD-10-CM

## 2015-02-05 DIAGNOSIS — E871 Hypo-osmolality and hyponatremia: Secondary | ICD-10-CM

## 2015-02-05 DIAGNOSIS — R7989 Other specified abnormal findings of blood chemistry: Secondary | ICD-10-CM

## 2015-02-05 NOTE — Telephone Encounter (Signed)
Notified pt and spouse. Pt denies any alcohol use and states she rarely takes tylenol. Scheduled pt lab appt for 02/19/15 at 2pm.  Lab orders entered.

## 2015-02-05 NOTE — Telephone Encounter (Signed)
-----   Message from Lelon Perla, DO sent at 02/05/2015  1:50 PM EDT ----- Sodium low-- prob from diuretic ( lasix)----- liver function slightly elevated----any tylenol or etoh?  Recheck 2 weeks--- hep, bmp

## 2015-02-16 ENCOUNTER — Other Ambulatory Visit: Payer: Self-pay | Admitting: Family Medicine

## 2015-02-17 NOTE — Telephone Encounter (Signed)
Last seen 02/03/15 and filled 01/15/15 #60

## 2015-02-19 ENCOUNTER — Other Ambulatory Visit (INDEPENDENT_AMBULATORY_CARE_PROVIDER_SITE_OTHER): Payer: Commercial Managed Care - HMO

## 2015-02-19 DIAGNOSIS — R945 Abnormal results of liver function studies: Principal | ICD-10-CM

## 2015-02-19 DIAGNOSIS — E871 Hypo-osmolality and hyponatremia: Secondary | ICD-10-CM | POA: Diagnosis not present

## 2015-02-19 DIAGNOSIS — R7989 Other specified abnormal findings of blood chemistry: Secondary | ICD-10-CM | POA: Diagnosis not present

## 2015-02-20 LAB — HEPATIC FUNCTION PANEL
ALT: 52 U/L — ABNORMAL HIGH (ref 0–35)
AST: 63 U/L — AB (ref 0–37)
Albumin: 3.3 g/dL — ABNORMAL LOW (ref 3.5–5.2)
Alkaline Phosphatase: 110 U/L (ref 39–117)
Bilirubin, Direct: 0.2 mg/dL (ref 0.0–0.3)
Total Bilirubin: 0.6 mg/dL (ref 0.2–1.2)
Total Protein: 6.9 g/dL (ref 6.0–8.3)

## 2015-02-20 LAB — BASIC METABOLIC PANEL
BUN: 12 mg/dL (ref 6–23)
CHLORIDE: 99 meq/L (ref 96–112)
CO2: 25 mEq/L (ref 19–32)
CREATININE: 0.6 mg/dL (ref 0.40–1.20)
Calcium: 9.2 mg/dL (ref 8.4–10.5)
GFR: 102.83 mL/min (ref >60.00)
GLUCOSE: 84 mg/dL (ref 70–99)
Potassium: 4.5 mEq/L (ref 3.5–5.1)
SODIUM: 130 meq/L — AB (ref 135–145)

## 2015-02-28 ENCOUNTER — Inpatient Hospital Stay (HOSPITAL_BASED_OUTPATIENT_CLINIC_OR_DEPARTMENT_OTHER)
Admission: EM | Admit: 2015-02-28 | Discharge: 2015-03-08 | DRG: 286 | Disposition: A | Discharge status: Skilled Nursing Facility | Payer: Commercial Managed Care - HMO | Attending: Internal Medicine | Admitting: Internal Medicine

## 2015-02-28 ENCOUNTER — Encounter (HOSPITAL_BASED_OUTPATIENT_CLINIC_OR_DEPARTMENT_OTHER): Payer: Self-pay | Admitting: *Deleted

## 2015-02-28 ENCOUNTER — Emergency Department (HOSPITAL_BASED_OUTPATIENT_CLINIC_OR_DEPARTMENT_OTHER): Payer: Commercial Managed Care - HMO

## 2015-02-28 ENCOUNTER — Encounter: Payer: Self-pay | Admitting: Family Medicine

## 2015-02-28 ENCOUNTER — Ambulatory Visit (INDEPENDENT_AMBULATORY_CARE_PROVIDER_SITE_OTHER): Payer: Commercial Managed Care - HMO | Admitting: Family Medicine

## 2015-02-28 VITALS — BP 108/62 | HR 72 | Temp 97.9°F | Resp 22 | Wt 132.4 lb

## 2015-02-28 DIAGNOSIS — M810 Age-related osteoporosis without current pathological fracture: Secondary | ICD-10-CM | POA: Diagnosis present

## 2015-02-28 DIAGNOSIS — Z79899 Other long term (current) drug therapy: Secondary | ICD-10-CM | POA: Diagnosis not present

## 2015-02-28 DIAGNOSIS — I2510 Atherosclerotic heart disease of native coronary artery without angina pectoris: Secondary | ICD-10-CM

## 2015-02-28 DIAGNOSIS — J449 Chronic obstructive pulmonary disease, unspecified: Secondary | ICD-10-CM | POA: Diagnosis present

## 2015-02-28 DIAGNOSIS — L03116 Cellulitis of left lower limb: Secondary | ICD-10-CM | POA: Diagnosis present

## 2015-02-28 DIAGNOSIS — I5033 Acute on chronic diastolic (congestive) heart failure: Principal | ICD-10-CM | POA: Insufficient documentation

## 2015-02-28 DIAGNOSIS — I480 Paroxysmal atrial fibrillation: Secondary | ICD-10-CM | POA: Diagnosis present

## 2015-02-28 DIAGNOSIS — Z6825 Body mass index (BMI) 25.0-25.9, adult: Secondary | ICD-10-CM

## 2015-02-28 DIAGNOSIS — I4891 Unspecified atrial fibrillation: Secondary | ICD-10-CM | POA: Diagnosis not present

## 2015-02-28 DIAGNOSIS — K219 Gastro-esophageal reflux disease without esophagitis: Secondary | ICD-10-CM | POA: Diagnosis present

## 2015-02-28 DIAGNOSIS — I270 Primary pulmonary hypertension: Secondary | ICD-10-CM | POA: Diagnosis not present

## 2015-02-28 DIAGNOSIS — I272 Other secondary pulmonary hypertension: Secondary | ICD-10-CM | POA: Diagnosis not present

## 2015-02-28 DIAGNOSIS — F329 Major depressive disorder, single episode, unspecified: Secondary | ICD-10-CM | POA: Diagnosis present

## 2015-02-28 DIAGNOSIS — B9689 Other specified bacterial agents as the cause of diseases classified elsewhere: Secondary | ICD-10-CM | POA: Diagnosis not present

## 2015-02-28 DIAGNOSIS — Z9119 Patient's noncompliance with other medical treatment and regimen: Secondary | ICD-10-CM | POA: Diagnosis present

## 2015-02-28 DIAGNOSIS — M1990 Unspecified osteoarthritis, unspecified site: Secondary | ICD-10-CM | POA: Diagnosis present

## 2015-02-28 DIAGNOSIS — E785 Hyperlipidemia, unspecified: Secondary | ICD-10-CM | POA: Diagnosis present

## 2015-02-28 DIAGNOSIS — Z950 Presence of cardiac pacemaker: Secondary | ICD-10-CM

## 2015-02-28 DIAGNOSIS — R0602 Shortness of breath: Secondary | ICD-10-CM

## 2015-02-28 DIAGNOSIS — I482 Chronic atrial fibrillation: Secondary | ICD-10-CM | POA: Diagnosis present

## 2015-02-28 DIAGNOSIS — J9621 Acute and chronic respiratory failure with hypoxia: Secondary | ICD-10-CM | POA: Diagnosis not present

## 2015-02-28 DIAGNOSIS — Z7901 Long term (current) use of anticoagulants: Secondary | ICD-10-CM | POA: Diagnosis not present

## 2015-02-28 DIAGNOSIS — Z9849 Cataract extraction status, unspecified eye: Secondary | ICD-10-CM

## 2015-02-28 DIAGNOSIS — R339 Retention of urine, unspecified: Secondary | ICD-10-CM | POA: Insufficient documentation

## 2015-02-28 DIAGNOSIS — R0600 Dyspnea, unspecified: Secondary | ICD-10-CM | POA: Diagnosis not present

## 2015-02-28 DIAGNOSIS — R740 Nonspecific elevation of levels of transaminase and lactic acid dehydrogenase [LDH]: Secondary | ICD-10-CM | POA: Diagnosis present

## 2015-02-28 DIAGNOSIS — E440 Moderate protein-calorie malnutrition: Secondary | ICD-10-CM | POA: Diagnosis present

## 2015-02-28 DIAGNOSIS — I5181 Takotsubo syndrome: Secondary | ICD-10-CM | POA: Diagnosis present

## 2015-02-28 DIAGNOSIS — R0902 Hypoxemia: Secondary | ICD-10-CM

## 2015-02-28 DIAGNOSIS — Z9981 Dependence on supplemental oxygen: Secondary | ICD-10-CM | POA: Diagnosis not present

## 2015-02-28 DIAGNOSIS — Z888 Allergy status to other drugs, medicaments and biological substances status: Secondary | ICD-10-CM

## 2015-02-28 DIAGNOSIS — J45909 Unspecified asthma, uncomplicated: Secondary | ICD-10-CM | POA: Diagnosis present

## 2015-02-28 DIAGNOSIS — R5381 Other malaise: Secondary | ICD-10-CM | POA: Insufficient documentation

## 2015-02-28 DIAGNOSIS — Z7951 Long term (current) use of inhaled steroids: Secondary | ICD-10-CM

## 2015-02-28 DIAGNOSIS — I509 Heart failure, unspecified: Secondary | ICD-10-CM | POA: Diagnosis not present

## 2015-02-28 DIAGNOSIS — E861 Hypovolemia: Secondary | ICD-10-CM | POA: Diagnosis present

## 2015-02-28 DIAGNOSIS — I481 Persistent atrial fibrillation: Secondary | ICD-10-CM

## 2015-02-28 DIAGNOSIS — J9620 Acute and chronic respiratory failure, unspecified whether with hypoxia or hypercapnia: Secondary | ICD-10-CM | POA: Diagnosis not present

## 2015-02-28 DIAGNOSIS — I2721 Secondary pulmonary arterial hypertension: Secondary | ICD-10-CM

## 2015-02-28 DIAGNOSIS — E43 Unspecified severe protein-calorie malnutrition: Secondary | ICD-10-CM

## 2015-02-28 DIAGNOSIS — R0609 Other forms of dyspnea: Secondary | ICD-10-CM | POA: Diagnosis not present

## 2015-02-28 DIAGNOSIS — E871 Hypo-osmolality and hyponatremia: Secondary | ICD-10-CM | POA: Diagnosis present

## 2015-02-28 LAB — TROPONIN I
TROPONIN I: 0.08 ng/mL — AB (ref <0.031)
TROPONIN I: 0.12 ng/mL — AB (ref <0.031)

## 2015-02-28 LAB — URINALYSIS, ROUTINE W REFLEX MICROSCOPIC
Bilirubin Urine: NEGATIVE
Glucose, UA: NEGATIVE mg/dL
Hgb urine dipstick: NEGATIVE
KETONES UR: NEGATIVE mg/dL
Leukocytes, UA: NEGATIVE
NITRITE: NEGATIVE
PROTEIN: NEGATIVE mg/dL
Specific Gravity, Urine: 1.008 (ref 1.005–1.030)
UROBILINOGEN UA: 0.2 mg/dL (ref 0.0–1.0)
pH: 6.5 (ref 5.0–8.0)

## 2015-02-28 LAB — COMPREHENSIVE METABOLIC PANEL
ALK PHOS: 120 U/L (ref 38–126)
ALT: 68 U/L — AB (ref 14–54)
ANION GAP: 9 (ref 5–15)
AST: 80 U/L — AB (ref 15–41)
Albumin: 3.4 g/dL — ABNORMAL LOW (ref 3.5–5.0)
BILIRUBIN TOTAL: 0.9 mg/dL (ref 0.3–1.2)
BUN: 10 mg/dL (ref 6–20)
CHLORIDE: 100 mmol/L — AB (ref 101–111)
CO2: 22 mmol/L (ref 22–32)
Calcium: 8.9 mg/dL (ref 8.9–10.3)
Creatinine, Ser: 0.65 mg/dL (ref 0.44–1.00)
GFR calc Af Amer: >60 mL/min (ref >60)
GFR calc non Af Amer: >60 mL/min (ref >60)
Glucose, Bld: 107 mg/dL — ABNORMAL HIGH (ref 65–99)
Potassium: 4.3 mmol/L (ref 3.5–5.1)
SODIUM: 131 mmol/L — AB (ref 135–145)
TOTAL PROTEIN: 7.1 g/dL (ref 6.5–8.1)

## 2015-02-28 LAB — CBC
HEMATOCRIT: 41.5 % (ref 36.0–46.0)
HEMOGLOBIN: 13.8 g/dL (ref 12.0–15.0)
MCH: 28.9 pg (ref 26.0–34.0)
MCHC: 33.3 g/dL (ref 30.0–36.0)
MCV: 86.8 fL (ref 78.0–100.0)
Platelets: 332 10*3/uL (ref 150–400)
RBC: 4.78 MIL/uL (ref 3.87–5.11)
RDW: 15.2 % (ref 11.5–15.5)
WBC: 8.4 10*3/uL (ref 4.0–10.5)

## 2015-02-28 LAB — BRAIN NATRIURETIC PEPTIDE: B Natriuretic Peptide: 393.9 pg/mL — ABNORMAL HIGH (ref 0.0–100.0)

## 2015-02-28 MED ORDER — VANCOMYCIN HCL IN DEXTROSE 1-5 GM/200ML-% IV SOLN
1000.0000 mg | INTRAVENOUS | Status: DC
  Administered 2015-02-28: 1000 mg via INTRAVENOUS
  Filled 2015-02-28 (×2): qty 200

## 2015-02-28 MED ORDER — FUROSEMIDE 10 MG/ML IJ SOLN
80.0000 mg | Freq: Once | INTRAMUSCULAR | Status: AC
  Administered 2015-02-28: 80 mg via INTRAVENOUS
  Filled 2015-02-28: qty 8

## 2015-02-28 NOTE — ED Provider Notes (Signed)
I saw and evaluated the patient, reviewed the resident's note and I agree with the findings and plan.   EKG Interpretation   Date/Time:  Friday February 28 2015 13:55:55 EDT Ventricular Rate:  69 PR Interval:    QRS Duration: 66 QT Interval:  496 QTC Calculation: 531 R Axis:   91 Text Interpretation:  Atrial fibrillation with occasional  ventricular-paced complexes Rightward axis Septal infarct , age  undetermined ST \\T \ T wave abnormality, consider inferior ischemia  Prolonged QT Abnormal ECG Confirmed by Amante Fomby  MD, Genelda Roark [phone]) on  02/28/2015 1:58:31 PM      Results for orders placed or performed during the hospital encounter of 02/28/15  CBC  Result Value Ref Range   WBC 8.4 4.0 - 10.5 K/uL   RBC 4.78 3.87 - 5.11 MIL/uL   Hemoglobin 13.8 12.0 - 15.0 g/dL   HCT 76.2 83.1 - 51.7 %   MCV 86.8 78.0 - 100.0 fL   MCH 28.9 26.0 - 34.0 pg   MCHC 33.3 30.0 - 36.0 g/dL   RDW 61.6 07.3 - 71.0 %   Platelets 332 150 - 400 K/uL  Comprehensive metabolic panel  Result Value Ref Range   Sodium 131 (L) 135 - 145 mmol/L   Potassium 4.3 3.5 - 5.1 mmol/L   Chloride 100 (L) 101 - 111 mmol/L   CO2 22 22 - 32 mmol/L   Glucose, Bld 107 (H) 65 - 99 mg/dL   BUN 10 6 - 20 mg/dL   Creatinine, Ser 6.26 0.44 - 1.00 mg/dL   Calcium 8.9 8.9 - 94.8 mg/dL   Total Protein 7.1 6.5 - 8.1 g/dL   Albumin 3.4 (L) 3.5 - 5.0 g/dL   AST 80 (H) 15 - 41 U/L   ALT 68 (H) 14 - 54 U/L   Alkaline Phosphatase 120 38 - 126 U/L   Total Bilirubin 0.9 0.3 - 1.2 mg/dL   GFR calc non Af Amer >60 >60 mL/min   GFR calc Af Amer >60 >60 mL/min   Anion gap 9 5 - 15  Brain natriuretic peptide  Result Value Ref Range   B Natriuretic Peptide 393.9 (H) 0.0 - 100.0 pg/mL  Troponin I  Result Value Ref Range   Troponin I 0.08 (H) <0.031 ng/mL   Dg Chest 2 View  02/28/2015   CLINICAL DATA:  Shortness of breath and lower extremity pitting edema ; history of atrial fibrillation and asthma  EXAM: CHEST  2 VIEW  COMPARISON:  PA  and lateral chest of Jan 17, 2015  FINDINGS: The lungs are mildly hyperinflated. The interstitial markings are increased. The pulmonary vascularity is engorged. The cardiac silhouette is mildly enlarged. A permanent pacemaker is in place with appropriate positioning of the electrodes. There is no pleural effusion. The bony thorax exhibits no acute abnormality.  IMPRESSION: Reactive airway disease with superimposed CHF and mild interstitial edema. There is no alveolar pneumonia.   Electronically Signed   By: David  [place] M.D.   On: 02/28/2015 14:17   Dg Wrist Complete Right  02/28/2015   CLINICAL DATA:  Wrist pain.  No known injury.  Initial evaluation.  EXAM: RIGHT WRIST - COMPLETE 3+ VIEW  COMPARISON:  None.  FINDINGS: Diffuse osteopenia and degenerative change. No acute bony or joint abnormality identified. No evidence of fracture or dislocation.  IMPRESSION: Negative.   Electronically Signed   By: Maisie Fus  Register   On: 02/28/2015 14:18   Mm Digital Screening Bilateral  02/04/2015   CLINICAL DATA:  Screening.  EXAM: DIGITAL SCREENING BILATERAL MAMMOGRAM WITH CAD  COMPARISON:  Previous exam(s).  ACR Breast Density Category c: The breast tissue is heterogeneously dense, which may obscure small masses.  FINDINGS: There are no findings suspicious for malignancy. Images were processed with CAD.  IMPRESSION: No mammographic evidence of malignancy. A result letter of this screening mammogram will be mailed directly to the patient.  RECOMMENDATION: Screening mammogram in one year. (Code:[id number])  BI-RADS CATEGORY  1: Negative.   Electronically Signed   By: Dalphine Handing M.D.   On: 02/04/2015 18:49    Patient sent down from primary care office upstairs. Followed by [facility] primary care. Patient had a routine appointment but was noted to have increased shortness of breath with rapid breathing patient normally on oxygen oxygen sats are normal amount were a little below 90. Patient has a history of bronchiectasis. Patient  has marked swelling to the left leg has been present for several months but there is also erythema to the anterior part.  Chest x-ray shows evidence of congestive heart failure. This probably responsible for the shortness of breath. Patient will be required to be started on BiPAP. Because she's now on 5 L of oxygen and still satting a little bit below 90%. Patient given 80 mg of Lasix for this. Patient is diuresing well.  Patient will get Doppler studies of the left leg just to reevaluate last Doppler was several months ago. Also the anterior redness is consistent with a cellulitis started on vancomycin. Patient without any fevers. Not hypotensive. And not tachycardic feel that she's not showing evidence of septic parameters at this point in time. Patient also has a mildly elevated troponin at 0.08. Additional evaluation with the admission for this will be important.  CRITICAL CARE Performed by: Vanetta Mulders Total critical care time: 30 Critical care time was exclusive of separately billable procedures and treating other patients. Critical care was necessary to treat or prevent imminent or life-threatening deterioration. Critical care was time spent personally by me on the following activities: development of treatment plan with patient and/or surrogate as well as nursing, discussions with consultants, evaluation of patient's response to treatment, examination of patient, obtaining history from patient or surrogate, ordering and performing treatments and interventions, ordering and review of laboratory studies, ordering and review of radiographic studies, pulse oximetry and re-evaluation of patient's condition.  Patient will require admission for the CHF for the left leg cellulitis and for the mildly elevated troponin.  Vanetta Mulders, MD 02/28/15 [phone]

## 2015-02-28 NOTE — ED Provider Notes (Addendum)
CSN: [id number]     Arrival date & time 02/28/15  1337 History   First MD Initiated Contact with Patient 02/28/15 1406     Chief Complaint  Patient presents with  . Leg Swelling     (Consider location/radiation/quality/duration/timing/severity/associated sxs/prior Treatment) HPI Comments: Pt. Is a 80 y/o F here with hx of Atrial Fibrillation, on Chronic Anticoagulation, History of Pulmonary HTN, Chronic Respiratory Failure. She presents today to her PCP with SOB with O2 sats of 89% on her baseline 4L O2 by nasal canula. She additionally, has had worsening swelling of her left lower extremity over the past month with some decreased appetite as well. She has not had any fever, chills, nausea, or vomiting. She has not taken her lasix over the past two days due to not wanting to go to the bathroom as frequently. She says that she feels her SOB is near baseline, though it is slightly worse. She has had worsening swelling in her LLE over the past month. She has previously had an ultrasound of this area without evidence of clotting, but she has had continued swelling of the left side only and now with erythema and some skin breakdown from chronic swelling in her LLE.   The history is provided by the patient and the spouse.    Past Medical History  Diagnosis Date  . Asthma   . Rhinitis   . Osteoarthritis   . Osteoporosis   . Peptic ulcer disease   . Depression   . Atrial fibrillation     s/p ablation   . Takotsubo cardiomyopathy     resolved  . Shingles   . Eosinophilia   . Sinoatrial node dysfunction   . GERD (gastroesophageal reflux disease)   . Bronchiectasis   . Peptic ulcer disease   . Osteoarthritis   . Herpes zoster   . Hyperlipemia   . Injury of splenic artery     infarct   Past Surgical History  Procedure Laterality Date  . Cholecystectomy    . Tonsillectomy    . Cataract extraction    . Pacemaker insertion  09/29/06    MDT ADDR01 dual chamber pacemaker implanted for SSS -  programmed VVIR 2/2 permanent atrial fibrillation  . Cosmetic surgery     Family History  Problem Relation Age of Onset  . Coronary artery disease      femal 1st degree relative  . Liver cancer Son   . Emphysema Sister   . Asthma Mother   . Depression Father     nervous breakdown   History  Substance Use Topics  . Smoking status: Never Smoker   . Smokeless tobacco: Never Used  . Alcohol Use: No   OB History    No data available     Review of Systems  Constitutional: Positive for activity change, appetite change and unexpected weight change. Negative for fever, chills, diaphoresis and fatigue.  HENT: Negative.  Negative for congestion and sore throat.   Eyes: Negative.   Respiratory: Positive for shortness of breath. Negative for apnea, cough, choking, chest tightness, wheezing and stridor.   Cardiovascular: Positive for palpitations and leg swelling. Negative for chest pain.  Gastrointestinal: Negative for nausea, vomiting, abdominal pain, diarrhea, constipation, blood in stool, abdominal distention, anal bleeding and rectal pain.  Endocrine: Negative.   Musculoskeletal: Negative.   Skin: Negative.   Allergic/Immunologic: Negative.   Neurological: Negative.   Hematological: Negative.   Psychiatric/Behavioral: Negative.       Allergies  Benzoyl peroxide  and Neomycin-bacitracin zn-polymyx  Home Medications   Prior to Admission medications   Medication Sig Start Date End Date Taking? Authorizing Provider  albuterol (PROAIR HFA) 108 (90 BASE) MCG/ACT inhaler Inhale 2 puffs into the lungs every 4 (four) hours as needed for wheezing or shortness of breath. 11/29/13   Leslye Peer, MD  Ascorbic Acid (VITAMIN C) 1000 MG tablet Take 1,000 mg by mouth daily.      Historical Provider, MD  atorvastatin (LIPITOR) 10 MG tablet TAKE 1 TABLET EVERY DAY Patient not taking: Reported on 02/28/2015 12/20/14   Lelon Perla, DO  beclomethasone (QVAR) 80 MCG/ACT inhaler Inhale 2 puffs  into the lungs 2 (two) times daily. 05/08/14   Leslye Peer, MD  BEPREVE 1.5 % SOLN at bedtime.  04/07/13   Historical Provider, MD  budesonide (RHINOCORT AQUA) 32 MCG/ACT nasal spray Place 1 spray into both nostrils daily. 01/23/14   Leslye Peer, MD  Calcium Carbonate (CALCIUM 600) 1500 MG TABS Take 1 tablet by mouth daily.      Historical Provider, MD  Cholecalciferol (VITAMIN D3) 1000 UNITS CAPS Take 1 capsule by mouth daily.      Historical Provider, MD  escitalopram (LEXAPRO) 10 MG tablet TAKE 1 TABLET (10 MG TOTAL) BY MOUTH DAILY. 10/22/14   Lelon Perla, DO  esomeprazole (NEXIUM) 40 MG capsule TAKE ONE CAPSULE BY MOUTH EVERY DAY BEFORE BREAKFAST 05/22/14   Grayling Congress Lowne, DO  fluticasone (FLONASE) 50 MCG/ACT nasal spray Place 2 sprays into both nostrils 2 (two) times daily. 10/17/13   Leslye Peer, MD  furosemide (LASIX) 20 MG tablet Take as directed. 12/10/14   Duke Salvia, MD  loratadine (CLARITIN) 10 MG tablet Take 10 mg by mouth daily.    Historical Provider, MD  Multiple Vitamin (MULTIVITAMIN) tablet Take 1 tablet by mouth daily.      Historical Provider, MD  tiotropium (SPIRIVA) 18 MCG inhalation capsule Place 1 capsule (18 mcg total) into inhaler and inhale daily. 08/08/14   Leslye Peer, MD  traZODone (DESYREL) 100 MG tablet TAKE 2 TABLETS BY MOUTH AT BEDTIME 02/17/15   Lelon Perla, DO  warfarin (COUMADIN) 2.5 MG tablet Take as directed by [facility] 01/13/15   Duke Salvia, MD   BP 141/62 mmHg  Pulse 71  Temp(Src) 97.5 F (36.4 C) (Oral)  Resp 38  Wt 132 lb (59.875 kg)  SpO2 89% Physical Exam  Constitutional: She is oriented to person, place, and time. She appears well-developed and well-nourished. No distress.  HENT:  Head: Normocephalic and atraumatic.  Right Ear: External ear normal.  Left Ear: External ear normal.  Eyes: Conjunctivae and EOM are normal. Pupils are equal, round, and reactive to light.  Neck: Normal range of motion. Neck supple. No JVD  present. No thyromegaly present.  Cardiovascular: Normal rate and normal pulses.  An irregularly irregular rhythm present. PMI is not displaced.   No murmur heard. Pulmonary/Chest: Accessory muscle usage present. Tachypnea noted. No respiratory distress. She has no decreased breath sounds. She has no wheezes. She has no rhonchi. She has rales in the right middle field, the right lower field, the left middle field and the left lower field.  Abdominal: Soft. Bowel sounds are normal. She exhibits no distension and no mass. There is no tenderness. There is no rebound and no guarding.  Musculoskeletal: Normal range of motion. She exhibits edema. She exhibits no tenderness.  Edema of LLE 3+ with erythema and areas  of skin breakdown.   Neurological: She is alert and oriented to person, place, and time. No cranial nerve deficit.  Skin: Skin is warm and dry. No rash noted. She is not diaphoretic. There is erythema. No pallor.  Psychiatric: She has a normal mood and affect. Her behavior is normal.    ED Course  Procedures (including critical care time) Labs Review Labs Reviewed  COMPREHENSIVE METABOLIC PANEL - Abnormal; Notable for the following:    Sodium 131 (*)    Chloride 100 (*)    Glucose, Bld 107 (*)    Albumin 3.4 (*)    AST 80 (*)    ALT 68 (*)    All other components within normal limits  BRAIN NATRIURETIC PEPTIDE - Abnormal; Notable for the following:    B Natriuretic Peptide 393.9 (*)    All other components within normal limits  TROPONIN I - Abnormal; Notable for the following:    Troponin I 0.08 (*)    All other components within normal limits  CBC  TROPONIN I    Imaging Review Dg Chest 2 View  02/28/2015   CLINICAL DATA:  Shortness of breath and lower extremity pitting edema ; history of atrial fibrillation and asthma  EXAM: CHEST  2 VIEW  COMPARISON:  PA and lateral chest of Jan 17, 2015  FINDINGS: The lungs are mildly hyperinflated. The interstitial markings are increased.  The pulmonary vascularity is engorged. The cardiac silhouette is mildly enlarged. A permanent pacemaker is in place with appropriate positioning of the electrodes. There is no pleural effusion. The bony thorax exhibits no acute abnormality.  IMPRESSION: Reactive airway disease with superimposed CHF and mild interstitial edema. There is no alveolar pneumonia.   Electronically Signed   By: David  [place]iland M.D.   On: 02/28/2015 14:17   Dg Wrist Complete Right  02/28/2015   CLINICAL DATA:  Wrist pain.  No known injury.  Initial evaluation.  EXAM: RIGHT WRIST - COMPLETE 3+ VIEW  COMPARISON:  None.  FINDINGS: Diffuse osteopenia and degenerative change. No acute bony or joint abnormality identified. No evidence of fracture or dislocation.  IMPRESSION: Negative.   Electronically Signed   By: Maisie Fus  Register   On: 02/28/2015 14:18     EKG Interpretation   Date/Time:  Friday February 28 2015 13:55:55 EDT Ventricular Rate:  69 PR Interval:    QRS Duration: 66 QT Interval:  496 QTC Calculation: 531 R Axis:   91 Text Interpretation:  Atrial fibrillation with occasional  ventricular-paced complexes Rightward axis Septal infarct , age  undetermined ST \\T \ T wave abnormality, consider inferior ischemia  Prolonged QT Abnormal ECG Confirmed by ZACKOWSKI  MD, SCOTT ([id number]) on  02/28/2015 1:58:31 PM      MDM   Final diagnoses:  Hypoxemia  Acute congestive heart failure, unspecified congestive heart failure type  Cellulitis of left lower extremity    Pt. Is a 80 y/o F with hx of Atrial Fibrillation, on Chronic Anticoagulation, History of Pulmonary HTN, Chronic Respiratory Failure on 4L of O2 at baseline. She is more tachypneic here with crackles in middle / lower lung fields clinically volume up. She had a CXR with moderate pulmonary edema, and CHF picture. BNP elevated. She has not been compliant with her lasix over the past two days. She has LE swelling on the left concerning for DVT as well as cellulitis.  Overall, will need admission for management of acute CHF exacerbation and concern for DVT and Cellulitis. Will give Lasix IV 80mg   here, Vancomycin, and will get Venous Duplex for DVT of LLE.   Pending Ultrasound for DVT. Pt given IV lasix and is already having good urine output. She did require increased O2 to 5L Drain due to sats persistently at 87%. Started on Bipap here in the ED. Will call for admission once Ultrasound for DVT completed.   [name] LLE negative for DVT. Will call for admission for CHF Exacerbation in the setting of atrial fibrillation with mild troponemia and cellulitis of LLE.   Yolande Jolly, MD 02/28/15 1657  Yolande Jolly, MD 02/28/15 1726

## 2015-02-28 NOTE — ED Notes (Signed)
Patient transported to X-ray 

## 2015-02-28 NOTE — Progress Notes (Signed)
Pt presented to office with sob, swollen L leg -- pt is on 4 L O2 Pulse ox 89% Pt sent to ER for evaluation ----pt and her husband  agree with the decision to send her down

## 2015-02-28 NOTE — ED Notes (Signed)
Error in completing order for UA.  Specimen was catheterized, not clean catch.

## 2015-02-28 NOTE — ED Notes (Signed)
Giving report to [facility] but that truck got diverted enroute.  We will have to wait for another truck.

## 2015-02-28 NOTE — ED Notes (Signed)
Pt sent from [facility]D office for eval increased leg swelling and increased SOB , Office reports 85% on 4 liters , pt in triage 95% on 4 liters

## 2015-02-28 NOTE — Progress Notes (Signed)
BP 152/78 mmHg  Pulse 71  Temp(Src) 97.5 F (36.4 C) (Oral)  Resp 36  Wt 59.875 kg (132 lb)  SpO73 46% 80 year old female with past medical history of atrial flutter ablation on chronic Coumadin, history of pulmonary hypertension with a last echo this year that showed a pressure of 66 mg of mercury on home O2 due to chronic respiratory failure if no known cause went to the PCP on the day of admission and she was satting 89% on 4 L. She was sent to the emergency room. She relates she had some additional lower extremity swelling on lower extremity Doppler was done to rule out a DVT but she has significant erythema and cellulitis. She will she has had a decreased appetite over the last several months. She is not taking her Lasix in the past 2 days due to not wanting to go to the bathroom. When she got to the emergency room she was satting 89% on 4 L was placed on BiPAP and was given a dose of IV Lasix and she diuresed about 600 mL. She is coming to step down for all acute on chronic respiratory failure most likely multifactorial acute decompensated heart failure, question acute right heart strain. She also has erythema on her left lower extremity was warm to touch she doesn't have a white count she has not had fever, emergency room doctor started empiric treatment with vancomycin for probable cellulitis. She will go to step down.

## 2015-02-28 NOTE — Progress Notes (Signed)
ANTIBIOTIC CONSULT NOTE - INITIAL  Pharmacy Consult for Vancomycin Indication: cellulitis  Allergies  Allergen Reactions  . Benzoyl Peroxide Swelling    Swelling at the site of use  . Neomycin-Bacitracin Zn-Polymyx Swelling    Swelling at the site of use    Patient Measurements: Weight: 132 lb (59.875 kg) Adjusted Body Weight:   Vital Signs: Temp: 97.5 F (36.4 C) (07/08 1341) Temp Source: Oral (07/08 1341) BP: 141/62 mmHg (07/08 1455) Pulse Rate: 71 (07/08 1455) Intake/Output from previous day:   Intake/Output from this shift:    Labs:  Recent Labs  02/28/15 1354  WBC 8.4  HGB 13.8  PLT 332  CREATININE 0.65   Estimated Creatinine Clearance: 46.4 mL/min (by C-G formula based on Cr of 0.65). No results for input(s): VANCOTROUGH, VANCOPEAK, VANCORANDOM, GENTTROUGH, GENTPEAK, GENTRANDOM, TOBRATROUGH, TOBRAPEAK, TOBRARND, AMIKACINPEAK, AMIKACINTROU, AMIKACIN in the last 72 hours.   Microbiology: No results found for this or any previous visit (from the past 720 hour(s)).  Medical History: Past Medical History  Diagnosis Date  . Asthma   . Rhinitis   . Osteoarthritis   . Osteoporosis   . Peptic ulcer disease   . Depression   . Atrial fibrillation     s/p ablation   . Takotsubo cardiomyopathy     resolved  . Shingles   . Eosinophilia   . Sinoatrial node dysfunction   . GERD (gastroesophageal reflux disease)   . Bronchiectasis   . Peptic ulcer disease   . Osteoarthritis   . Herpes zoster   . Hyperlipemia   . Injury of splenic artery     infarct    Medications:   (Not in a hospital admission) Scheduled:  . furosemide  80 mg Intravenous Once   Infusions:  . vancomycin     Assessment: 80yo female presents to [facility] with SOB and swollen left leg. Pharmacy is consulted to dose vancomycin for suspected cellulitis. Pt is afebrile, WBC wnl, sCr 0.65.  Goal of Therapy:  Vancomycin trough level 10-15 mcg/ml  Plan:  Vancomycin 1g IV q24h Measure  antibiotic drug levels at steady state Follow up culture results, renal function, and clinical course  Arlean Hopping. Newman Pies, PharmD Clinical Pharmacist Pager [phone] 02/28/2015,3:25 PM

## 2015-02-28 NOTE — ED Notes (Signed)
Pt sent from PCP's office for eval of increased SOB, increased left leg swelling and decreased appetite. Pt has +3 pitting edema in left leg to above her knee.

## 2015-02-28 NOTE — Progress Notes (Signed)
Pre visit review using our clinic review tool, if applicable. No additional management support is needed unless otherwise documented below in the visit note. 

## 2015-02-28 NOTE — H&P (Signed)
[facility] History and Physical  [name] WJX:[id number] DOB: 1935-04-09 DOA: 02/28/2015  Referring physician: ED physician PCP: [name] Freud, DO   Chief Complaint: Increased fatigue, SOB  HPI:  [name] is a 80yo woman with PMH of OA, Arib, pulmonary HTN, GERD, CHF, depression who presents due to increased fatigue and SOB.  She had a TTE last year which showed a PA pressure of 66 making pulmonary HTN likely.  She is on home O2 for chronic respiratory failure.  Apparently, over the past few days she has not wanted to take her lasix b/c she did not want to have to go to the bathroom so much.  She presented to her PCP today and was noted to have pOx of 89% on her home O2 of 4L.  She was also noted to only be able to walk about 10 feet when she needed to stop and rest.  Further symptoms include decreased appetite.  Her husband also noted that she has had progressive swelling of her left leg with erythema of that leg and pitting edema.  This has not be very uncomfortable to her, but has been concerning.  Finally, her family has noted excessive daytime sleepiness and that she only gets up for meals.  In the ED at [facility] she was again noted to be satting 89% on 4LNC with tachypnea.  Her BP was stable.  She was placed on BIPAP and given IV lasix, after which she diuresed 600cc.  She had a foley placed.  BNP at that time was noted to be 393.9 and TnI was 0.08 --> 0.12.  She further received vancomycin for her cellulitis.  US of the leg showed no DVT.   She is on coumadin for Afib.   Assessment and Plan: Acute on chronic respiratory failure due to  Pulmonary hypertension - This is a relatively new diagnosis for [name] and is being actively worked up by Dr. Delton Coombes, she is ANA positive, and this is being investigated as a possible cause - For today, she has progressive symptoms and has not been taking her diuretic - She had a very good response to IV lasix.  Will continue IV lasix 80mg  for 1 more  dose - Bipap overnight - No ABG was obtained, so would decide clinically when patient can have a trial off of Bipap - Trend CE given slight bump today - AM EKG - Daily weight, I/O - D/C foley as soon as possible - Continue Qvar, spiriva - Duonebs when not on BIPAP - Consider pulmonary consult if not improving  Cellulitis of LLE - Started on Vancomycin in the ED, will continue for now.   - Can transition to oral therapy once respiratory status improved.  - Ultrasound negative for clot.     Atrial fibrillation-persistent - She is on coumadin, which will be continued  Transaminitis - Unclear cause, possible congestive hepatopathy - Check hepatitis panel  Mild Hyponatremia - Chronic and within baseline, monitor - BMET in th eAM   DVT PPx: on coumadin Radiological Exams on Admission: Dg Chest 2 View  02/28/2015   CLINICAL DATA:  Shortness of breath and lower extremity pitting edema ; history of atrial fibrillation and asthma  EXAM: CHEST  2 VIEW  COMPARISON:  PA and lateral chest of Jan 17, 2015  FINDINGS: The lungs are mildly hyperinflated. The interstitial markings are increased. The pulmonary vascularity is engorged. The cardiac silhouette is mildly enlarged. A permanent pacemaker is in place with appropriate positioning of  the electrodes. There is no pleural effusion. The bony thorax exhibits no acute abnormality.  IMPRESSION: Reactive airway disease with superimposed CHF and mild interstitial edema. There is no alveolar pneumonia.   Electronically Signed   By: David  [place]iland M.D.   On: 02/28/2015 14:17   Dg Wrist Complete Right  02/28/2015   CLINICAL DATA:  Wrist pain.  No known injury.  Initial evaluation.  EXAM: RIGHT WRIST - COMPLETE 3+ VIEW  COMPARISON:  None.  FINDINGS: Diffuse osteopenia and degenerative change. No acute bony or joint abnormality identified. No evidence of fracture or dislocation.  IMPRESSION: Negative.   Electronically Signed   By: Maisie Fus  Register   On:  02/28/2015 14:18   US Venous Img Lower Unilateral Left  02/28/2015   CLINICAL DATA:  Left lower extremity swelling for 2 months.  EXAM: LEFT LOWER EXTREMITY VENOUS DOPPLER ULTRASOUND  TECHNIQUE: Gray-scale sonography with graded compression, as well as color Doppler and duplex ultrasound were performed to evaluate the lower extremity deep venous systems from the level of the common femoral vein and including the common femoral, femoral, profunda femoral, popliteal and calf veins including the posterior tibial, peroneal and gastrocnemius veins when visible. The superficial great saphenous vein was also interrogated. Spectral Doppler was utilized to evaluate flow at rest and with distal augmentation maneuvers in the common femoral, femoral and popliteal veins.  COMPARISON:  None.  FINDINGS: Contralateral Common Femoral Vein: Respiratory phasicity is normal and symmetric with the symptomatic side. No evidence of thrombus. Normal compressibility.  Common Femoral Vein: No evidence of thrombus. Normal compressibility, respiratory phasicity and response to augmentation.  Saphenofemoral Junction: No evidence of thrombus. Normal compressibility and flow on color Doppler imaging.  Profunda Femoral Vein: No evidence of thrombus. Normal compressibility and flow on color Doppler imaging.  Femoral Vein: No evidence of thrombus. Normal compressibility, respiratory phasicity and response to augmentation.  Popliteal Vein: No evidence of thrombus. Normal compressibility, respiratory phasicity and response to augmentation.  Calf Veins: No evidence of thrombus. Normal compressibility and flow on color Doppler imaging.  Superficial Great Saphenous Vein: No evidence of thrombus. Normal compressibility and flow on color Doppler imaging.  Venous Reflux:  None.  Other Findings:  None.  IMPRESSION: No evidence of deep venous thrombosis.   Electronically Signed   By: Myles Rosenthal M.D.   On: 02/28/2015 17:11     Code Status: Full,  discussed with patient and husband.  Family Communication: Pt at bedside Disposition Plan: Admit for further evaluation    Debe Coder, MD [phone]   Review of Systems:  Constitutional: + for malaise, increased sleepiness. Negative for fever, chills and diaphoresis.  HENT: Negative for hearing loss Eyes: Negative for blurred vision, double vision Respiratory: + for SOB Negative for cough, hemoptysis, sputum production, wheezing Cardiovascular: + for leg swelling. Negative for chest pain, palpitations  Gastrointestinal: + for nausea and vomiting once with her pills. Negative for abdominal pain.  Genitourinary: Negative for dysuria, urgency, frequency Musculoskeletal: Negative for back pain, joint pain and falls.  Skin: + for erythema and swelling of left LE Neurological: Negative for dizziness and weakness. Endo/Heme/Allergies: Negative for environmental allergies and polydipsia. Does not bruise/bleed easily.     Past Medical History  Diagnosis Date  . Asthma   . Rhinitis   . Osteoarthritis   . Osteoporosis   . Peptic ulcer disease   . Depression   . Atrial fibrillation     s/p ablation   . Takotsubo cardiomyopathy  resolved  . Shingles   . Eosinophilia   . Sinoatrial node dysfunction   . GERD (gastroesophageal reflux disease)   . Bronchiectasis   . Peptic ulcer disease   . Osteoarthritis   . Herpes zoster   . Hyperlipemia   . Injury of splenic artery     infarct    Past Surgical History  Procedure Laterality Date  . Cholecystectomy    . Tonsillectomy    . Cataract extraction    . Pacemaker insertion  09/29/06    MDT [id number] dual chamber pacemaker implanted for SSS - programmed VVIR 2/2 permanent atrial fibrillation  . Cosmetic surgery      Social History:  reports that she has never smoked. She has never used smokeless tobacco. She reports that she does not drink alcohol or use illicit drugs.  Allergies  Allergen Reactions  . Benzoyl Peroxide Swelling     Swelling at the site of use  . Neomycin-Bacitracin Zn-Polymyx Swelling    Swelling at the site of use    Family History  Problem Relation Age of Onset  . Coronary artery disease      femal 1st degree relative  . Liver cancer Son   . Emphysema Sister   . Asthma Mother   . Depression Father     nervous breakdown    Prior to Admission medications   Medication Sig Start Date End Date Taking? Authorizing Provider  albuterol (PROAIR HFA) 108 (90 BASE) MCG/ACT inhaler Inhale 2 puffs into the lungs every 4 (four) hours as needed for wheezing or shortness of breath. 11/29/13   Leslye Peer, MD  Ascorbic Acid (VITAMIN C) 1000 MG tablet Take 1,000 mg by mouth daily.      Historical Provider, MD  atorvastatin (LIPITOR) 10 MG tablet TAKE 1 TABLET EVERY DAY Patient not taking: Reported on 02/28/2015 12/20/14   Lelon Perla, DO  beclomethasone (QVAR) 80 MCG/ACT inhaler Inhale 2 puffs into the lungs 2 (two) times daily. 05/08/14   Leslye Peer, MD  BEPREVE 1.5 % SOLN at bedtime.  04/07/13   Historical Provider, MD  budesonide (RHINOCORT AQUA) 32 MCG/ACT nasal spray Place 1 spray into both nostrils daily. 01/23/14   Leslye Peer, MD  Calcium Carbonate (CALCIUM 600) 1500 MG TABS Take 1 tablet by mouth daily.      Historical Provider, MD  Cholecalciferol (VITAMIN D3) 1000 UNITS CAPS Take 1 capsule by mouth daily.      Historical Provider, MD  escitalopram (LEXAPRO) 10 MG tablet TAKE 1 TABLET (10 MG TOTAL) BY MOUTH DAILY. 10/22/14   Lelon Perla, DO  esomeprazole (NEXIUM) 40 MG capsule TAKE ONE CAPSULE BY MOUTH EVERY DAY BEFORE BREAKFAST 05/22/14   Grayling Congress Lowne, DO  fluticasone (FLONASE) 50 MCG/ACT nasal spray Place 2 sprays into both nostrils 2 (two) times daily. 10/17/13   Leslye Peer, MD  furosemide (LASIX) 20 MG tablet Take as directed. 12/10/14   Duke Salvia, MD  loratadine (CLARITIN) 10 MG tablet Take 10 mg by mouth daily.    Historical Provider, MD  Multiple Vitamin (MULTIVITAMIN) tablet Take  1 tablet by mouth daily.      Historical Provider, MD  tiotropium (SPIRIVA) 18 MCG inhalation capsule Place 1 capsule (18 mcg total) into inhaler and inhale daily. 08/08/14   Leslye Peer, MD  traZODone (DESYREL) 100 MG tablet TAKE 2 TABLETS BY MOUTH AT BEDTIME 02/17/15   Lelon Perla, DO  warfarin (COUMADIN) 2.5 MG tablet  Take as directed by [facility] 01/13/15   Duke Salvia, MD    Physical Exam: Filed Vitals:   02/28/15 1900 02/28/15 2000 02/28/15 2130 02/28/15 2300  BP: 124/66 129/55 132/61 123/60  Pulse: 64 67 63 59  Temp:    97.8 F (36.6 C)  TempSrc:    Oral  Resp: 39 43 40 40  Weight:      SpO2: 97% 98% 100% 100%    Physical Exam  Constitutional: Thin, elderly woman, BIPAP in place, tachypneic.  HENT: Normocephalic. Eyes: Conjunctivae and EOM are normal. no scleral icterus.  Neck: Normal ROM. Neck supple.  CVS: RR, NR, S1/S2 +, no murmurs Pulmonary: Crackles at bases, tachypneic, no wheezes noted, she is breathing relatively comfortably with the BIPAP, but is breathing fast  Abdominal: Soft. BS +,  no distension, tenderness.  She has a well healed midline scar Musculoskeletal: + pitting edema to the LLE Neuro: Alert. Normal muscle tone.  Skin: Skin is warm and dry. + erythema to LLE to the mid thigh, no open wounds, swelling is improved with lasix.   Psychiatric: Normal mood and affect. Behavior, judgment, thought content normal.  Unable to answer much of history herself given BIPAP mask.   Labs on Admission:  Basic Metabolic Panel:  Recent Labs Lab 02/28/15 1354  NA 131*  K 4.3  CL 100*  CO2 22  GLUCOSE 107*  BUN 10  CREATININE 0.65  CALCIUM 8.9   Liver Function Tests:  Recent Labs Lab 02/28/15 1354  AST 80*  ALT 68*  ALKPHOS 120  BILITOT 0.9  PROT 7.1  ALBUMIN 3.4*   CBC:  Recent Labs Lab 02/28/15 1354  WBC 8.4  HGB 13.8  HCT 41.5  MCV 86.8  PLT 332   Cardiac Enzymes:  Recent Labs Lab 02/28/15 1354 02/28/15 2005  TROPONINI  0.08* 0.12*    EKG: Afib   If 7PM-7AM, please contact night-coverage [email or web address] Password [email or web address] 03/01/2015, 1:04 AM

## 2015-02-28 NOTE — ED Notes (Signed)
MD aware of resp rate and O2 sats.

## 2015-03-01 DIAGNOSIS — L03116 Cellulitis of left lower limb: Secondary | ICD-10-CM | POA: Diagnosis present

## 2015-03-01 DIAGNOSIS — I509 Heart failure, unspecified: Secondary | ICD-10-CM | POA: Insufficient documentation

## 2015-03-01 DIAGNOSIS — L0390 Cellulitis, unspecified: Secondary | ICD-10-CM | POA: Insufficient documentation

## 2015-03-01 LAB — BASIC METABOLIC PANEL
Anion gap: 11 (ref 5–15)
BUN: 8 mg/dL (ref 6–20)
CO2: 25 mmol/L (ref 22–32)
CREATININE: 0.78 mg/dL (ref 0.44–1.00)
Calcium: 8.6 mg/dL — ABNORMAL LOW (ref 8.9–10.3)
Chloride: 96 mmol/L — ABNORMAL LOW (ref 101–111)
Glucose, Bld: 87 mg/dL (ref 65–99)
POTASSIUM: 3.4 mmol/L — AB (ref 3.5–5.1)
Sodium: 132 mmol/L — ABNORMAL LOW (ref 135–145)

## 2015-03-01 LAB — TROPONIN I
TROPONIN I: 0.1 ng/mL — AB (ref <0.031)
Troponin I: 0.1 ng/mL — ABNORMAL HIGH (ref <0.031)
Troponin I: 0.09 ng/mL — ABNORMAL HIGH (ref <0.031)

## 2015-03-01 LAB — PROTIME-INR
INR: 2.56 — ABNORMAL HIGH (ref 0.00–1.49)
Prothrombin Time: 27.1 seconds — ABNORMAL HIGH (ref 11.6–15.2)

## 2015-03-01 LAB — CBC
HEMATOCRIT: 43 % (ref 36.0–46.0)
Hemoglobin: 14.4 g/dL (ref 12.0–15.0)
MCH: 29 pg (ref 26.0–34.0)
MCHC: 33.5 g/dL (ref 30.0–36.0)
MCV: 86.7 fL (ref 78.0–100.0)
Platelets: 293 10*3/uL (ref 150–400)
RBC: 4.96 MIL/uL (ref 3.87–5.11)
RDW: 15.2 % (ref 11.5–15.5)
WBC: 8.6 10*3/uL (ref 4.0–10.5)

## 2015-03-01 LAB — BRAIN NATRIURETIC PEPTIDE: B NATRIURETIC PEPTIDE 5: 491.9 pg/mL — AB (ref 0.0–100.0)

## 2015-03-01 LAB — MRSA PCR SCREENING: MRSA BY PCR: NEGATIVE

## 2015-03-01 MED ORDER — FUROSEMIDE 10 MG/ML IJ SOLN
80.0000 mg | Freq: Once | INTRAMUSCULAR | Status: AC
  Administered 2015-03-01: 80 mg via INTRAVENOUS
  Filled 2015-03-01: qty 8

## 2015-03-01 MED ORDER — ESCITALOPRAM OXALATE 10 MG PO TABS
10.0000 mg | ORAL_TABLET | Freq: Every day | ORAL | Status: DC
  Administered 2015-03-01 – 2015-03-08 (×8): 10 mg via ORAL
  Filled 2015-03-01 (×8): qty 1

## 2015-03-01 MED ORDER — LISINOPRIL 2.5 MG PO TABS
2.5000 mg | ORAL_TABLET | Freq: Every day | ORAL | Status: DC
  Administered 2015-03-01 – 2015-03-08 (×8): 2.5 mg via ORAL
  Filled 2015-03-01 (×8): qty 1

## 2015-03-01 MED ORDER — POTASSIUM CHLORIDE CRYS ER 20 MEQ PO TBCR
40.0000 meq | EXTENDED_RELEASE_TABLET | Freq: Two times a day (BID) | ORAL | Status: AC
  Administered 2015-03-01 (×2): 40 meq via ORAL
  Filled 2015-03-01 (×2): qty 2

## 2015-03-01 MED ORDER — LORATADINE 10 MG PO TABS
10.0000 mg | ORAL_TABLET | Freq: Every day | ORAL | Status: DC
  Administered 2015-03-01 – 2015-03-08 (×8): 10 mg via ORAL
  Filled 2015-03-01 (×8): qty 1

## 2015-03-01 MED ORDER — ALBUTEROL SULFATE (2.5 MG/3ML) 0.083% IN NEBU: 3.0000 mL | INHALATION_SOLUTION | RESPIRATORY_TRACT | Status: DC | PRN

## 2015-03-01 MED ORDER — WARFARIN SODIUM 2.5 MG PO TABS
2.5000 mg | ORAL_TABLET | ORAL | Status: DC
  Administered 2015-03-02: 2.5 mg via ORAL
  Filled 2015-03-01 (×2): qty 1

## 2015-03-01 MED ORDER — CHLORHEXIDINE GLUCONATE 0.12 % MT SOLN
15.0000 mL | Freq: Two times a day (BID) | OROMUCOSAL | Status: DC
  Administered 2015-03-01 – 2015-03-08 (×16): 15 mL via OROMUCOSAL
  Filled 2015-03-01 (×17): qty 15

## 2015-03-01 MED ORDER — FUROSEMIDE 10 MG/ML IJ SOLN: 80.0000 mg | Freq: Two times a day (BID) | INTRAMUSCULAR | Status: DC

## 2015-03-01 MED ORDER — WARFARIN - PHARMACIST DOSING INPATIENT
Freq: Every day | Status: DC
  Administered 2015-03-01 – 2015-03-02 (×2)

## 2015-03-01 MED ORDER — PRO-STAT SUGAR FREE PO LIQD
30.0000 mL | Freq: Two times a day (BID) | ORAL | Status: DC
  Administered 2015-03-01 – 2015-03-07 (×9): 30 mL via ORAL
  Filled 2015-03-01 (×14): qty 30

## 2015-03-01 MED ORDER — ALBUTEROL SULFATE (2.5 MG/3ML) 0.083% IN NEBU
2.5000 mg | INHALATION_SOLUTION | Freq: Four times a day (QID) | RESPIRATORY_TRACT | Status: DC
  Administered 2015-03-01 – 2015-03-02 (×5): 2.5 mg via RESPIRATORY_TRACT
  Filled 2015-03-01 (×5): qty 3

## 2015-03-01 MED ORDER — TRAZODONE HCL 100 MG PO TABS
200.0000 mg | ORAL_TABLET | Freq: Every day | ORAL | Status: DC
  Administered 2015-03-01 – 2015-03-07 (×7): 200 mg via ORAL
  Filled 2015-03-01 (×8): qty 2

## 2015-03-01 MED ORDER — CETYLPYRIDINIUM CHLORIDE 0.05 % MT LIQD
7.0000 mL | Freq: Two times a day (BID) | OROMUCOSAL | Status: DC
  Administered 2015-03-01 – 2015-03-08 (×11): 7 mL via OROMUCOSAL

## 2015-03-01 MED ORDER — WHITE PETROLATUM GEL
Status: AC
  Administered 2015-03-01: 0.2
  Filled 2015-03-01: qty 1

## 2015-03-01 MED ORDER — DOXYCYCLINE HYCLATE 100 MG IV SOLR
100.0000 mg | Freq: Two times a day (BID) | INTRAVENOUS | Status: DC
  Administered 2015-03-01 (×2): 100 mg via INTRAVENOUS
  Filled 2015-03-01 (×4): qty 100

## 2015-03-01 MED ORDER — FLUTICASONE PROPIONATE 50 MCG/ACT NA SUSP
2.0000 | Freq: Every day | NASAL | Status: DC
  Administered 2015-03-01 – 2015-03-08 (×8): 2 via NASAL
  Filled 2015-03-01: qty 16

## 2015-03-01 MED ORDER — FUROSEMIDE 10 MG/ML IJ SOLN
40.0000 mg | Freq: Two times a day (BID) | INTRAMUSCULAR | Status: DC
  Administered 2015-03-01 – 2015-03-03 (×5): 40 mg via INTRAVENOUS
  Filled 2015-03-01 (×7): qty 4

## 2015-03-01 MED ORDER — TIOTROPIUM BROMIDE MONOHYDRATE 18 MCG IN CAPS
18.0000 ug | ORAL_CAPSULE | Freq: Every day | RESPIRATORY_TRACT | Status: DC
  Administered 2015-03-01 – 2015-03-08 (×8): 18 ug via RESPIRATORY_TRACT
  Filled 2015-03-01 (×2): qty 5

## 2015-03-01 MED ORDER — BUDESONIDE 0.25 MG/2ML IN SUSP
0.2500 mg | Freq: Two times a day (BID) | RESPIRATORY_TRACT | Status: DC
  Administered 2015-03-01 – 2015-03-03 (×5): 0.25 mg via RESPIRATORY_TRACT
  Filled 2015-03-01 (×7): qty 2

## 2015-03-01 MED ORDER — WARFARIN 1.25 MG HALF TABLET
1.2500 mg | ORAL_TABLET | ORAL | Status: DC
  Administered 2015-03-01: 1.25 mg via ORAL
  Filled 2015-03-01 (×2): qty 1

## 2015-03-01 MED ORDER — PANTOPRAZOLE SODIUM 40 MG PO TBEC
80.0000 mg | DELAYED_RELEASE_TABLET | Freq: Every day | ORAL | Status: DC
  Administered 2015-03-01 – 2015-03-05 (×5): 80 mg via ORAL
  Filled 2015-03-01 (×5): qty 2

## 2015-03-01 MED ORDER — ATORVASTATIN CALCIUM 10 MG PO TABS
10.0000 mg | ORAL_TABLET | Freq: Every day | ORAL | Status: DC
  Administered 2015-03-01 – 2015-03-07 (×7): 10 mg via ORAL
  Filled 2015-03-01 (×9): qty 1

## 2015-03-01 NOTE — Progress Notes (Signed)
ANTICOAGULATION CONSULT NOTE - Initial Consult  Pharmacy Consult for Warfarin  Indication: atrial fibrillation  Allergies  Allergen Reactions  . Benzoyl Peroxide Swelling    Swelling at the site of use  . Neomycin-Bacitracin Zn-Polymyx Swelling    Swelling at the site of use    Patient Measurements: Weight: 132 lb (59.875 kg)  Vital Signs: Temp: 97.8 F (36.6 C) (07/08 2300) Temp Source: Oral (07/08 2300) BP: 85/46 mmHg (07/09 0110) Pulse Rate: 61 (07/09 0110)  Labs:  Recent Labs  02/28/15 1354 02/28/15 2005 03/01/15 0115  HGB 13.8  --   --   HCT 41.5  --   --   PLT 332  --   --   LABPROT  --   --  27.1*  INR  --   --  2.56*  CREATININE 0.65  --   --   TROPONINI 0.08* 0.12*  --     Estimated Creatinine Clearance: 46.4 mL/min (by C-G formula based on Cr of 0.65).   Medical History: Past Medical History  Diagnosis Date  . Asthma   . Rhinitis   . Osteoarthritis   . Osteoporosis   . Peptic ulcer disease   . Depression   . Atrial fibrillation     s/p ablation   . Takotsubo cardiomyopathy     resolved  . Shingles   . Eosinophilia   . Sinoatrial node dysfunction   . GERD (gastroesophageal reflux disease)   . Bronchiectasis   . Peptic ulcer disease   . Osteoarthritis   . Herpes zoster   . Hyperlipemia   . Injury of splenic artery     infarct    Assessment: 80 y/o F here with acute on chronic respiratory failure/cellulitis, takes warfarin PTA for afib which will be continued. INR on admit is 2.56. CBC good. Renal function good. Other labs as above.   Warfarin home dose per outpatient anti-coag notes: 2.5 mg on Tues/Thurs/Sun, 1.25 mg all other days  Goal of Therapy:  INR 2-3 Monitor platelets by anticoagulation protocol: Yes   Plan:  -Continue warfarin home dose as above -Daily PT/INR -Monitor for bleeding  Abran Duke 03/01/2015,2:05 AM

## 2015-03-01 NOTE — Progress Notes (Signed)
[facility] PROGRESS NOTE Interim History: 80yo woman with PMH of OA, Arib, pulmonary HTN, GERD, CHF, depression who presents due to increased fatigue and SOB. She had a TTE last year which showed a PA pressure of 66 making pulmonary HTN likely. She is on home O2 for chronic respiratory failure. Apparently, over the past few days she has not wanted to take her lasix b/c she did not want to have to go to the bathroom so much. She presented to her PCP today and was noted to have pOx of 89% on her home O2 of 4L. She was also noted to only be able to walk about 10 feet when she needed to stop and rest [facility] Weights   02/28/15 1341 03/01/15 0300  Weight: 59.875 kg (132 lb) 58.2 kg (128 lb 4.9 oz)        Intake/Output Summary (Last 24 hours) at 03/01/15 0758 Last data filed at 03/01/15 0500  Gross per 24 hour  Intake      0 ml  Output   2150 ml  Net  -2150 ml     Assessment/Plan: Acute on chronic respiratory failure due to Acute diastolic heart failure: - Started on IV lasix with good diuresis, restricted her diet. - monitor lytes. Bp tolerating diuresis. Monitor Cr. - not on ACE-I or Beta blockers. - add low dose ACE-i. - try to wean her of non-rebreather.  Cellulitis of left lower extremity - Started on IV vancomycin on 7.8.2016, afebrile, erythema receding. - change to IV doxy.  Pulmonary hypertension  Atrial fibrillation-persistent - rate controlled. INR therapeutic.    Code Status: full Family Communication: husband  Disposition Plan: home in 2-3 days   Consultants:  none  Procedures: ECHO: none  Antibiotics:  vanc  HPI/Subjective: Relates SOB is better  Objective: Filed Vitals:   03/01/15 0520 03/01/15 0546 03/01/15 0620 03/01/15 0625  BP: 124/63 124/63 124/63   Pulse: 69 66 61 60  Temp:      TempSrc:      Resp: Weight:      SpO2: 96% 93% 88% 96%     Exam:  General: Alert, awake, oriented x3, in no acute distress.    HEENT: No bruits, no goiter. +JVD Heart: Regular rate and rhythm. Lungs: Good air movement, crackles bilaterally Abdomen: Soft, nontender, nondistended, positive bowel sounds.  Neuro: Grossly intact, nonfocal.   Data Reviewed: Basic Metabolic Panel:  Recent Labs Lab 02/28/15 1354 03/01/15 0604  NA 131* 132*  K 4.3 3.4*  CL 100* 96*  CO2 22 25  GLUCOSE 107* 87  BUN 10 8  CREATININE 0.65 0.78  CALCIUM 8.9 8.6*   Liver Function Tests:  Recent Labs Lab 02/28/15 1354  AST 80*  ALT 68*  ALKPHOS 120  BILITOT 0.9  PROT 7.1  ALBUMIN 3.4*   No results for input(s): LIPASE, AMYLASE in the last 168 hours. No results for input(s): AMMONIA in the last 168 hours. CBC:  Recent Labs Lab 02/28/15 1354 03/01/15 0604  WBC 8.4 8.6  HGB 13.8 14.4  HCT 41.5 43.0  MCV 86.8 86.7  PLT 332 293   Cardiac Enzymes:  Recent Labs Lab 02/28/15 1354 02/28/15 2005 03/01/15 0108 03/01/15 0604  TROPONINI 0.08* 0.12* 0.10* 0.10*   BNP (last 3 results)  Recent Labs  02/28/15 1354 03/01/15 0108  BNP 393.9* 491.9*    ProBNP (last 3 results) No results for input(s): PROBNP in the last 8760 hours.  CBG: No results  for input(s): GLUCAP in the last 168 hours.  Recent Results (from the past 240 hour(s))  MRSA PCR Screening     Status: None   Collection Time: 02/28/15 11:22 PM  Result Value Ref Range Status   MRSA by PCR NEGATIVE NEGATIVE Final    Comment:        The GeneXpert MRSA Assay (FDA approved for NASAL specimens only), is one component of a comprehensive MRSA colonization surveillance program. It is not intended to diagnose MRSA infection nor to guide or monitor treatment for MRSA infections.      Studies: Dg Chest 2 View  02/28/2015   CLINICAL DATA:  Shortness of breath and lower extremity pitting edema ; history of atrial fibrillation and asthma  EXAM: CHEST  2 VIEW  COMPARISON:  PA and lateral chest of Jan 17, 2015  FINDINGS: The lungs are mildly  hyperinflated. The interstitial markings are increased. The pulmonary vascularity is engorged. The cardiac silhouette is mildly enlarged. A permanent pacemaker is in place with appropriate positioning of the electrodes. There is no pleural effusion. The bony thorax exhibits no acute abnormality.  IMPRESSION: Reactive airway disease with superimposed CHF and mild interstitial edema. There is no alveolar pneumonia.   Electronically Signed   By: David  [place]iland M.D.   On: 02/28/2015 14:17   Dg Wrist Complete Right  02/28/2015   CLINICAL DATA:  Wrist pain.  No known injury.  Initial evaluation.  EXAM: RIGHT WRIST - COMPLETE 3+ VIEW  COMPARISON:  None.  FINDINGS: Diffuse osteopenia and degenerative change. No acute bony or joint abnormality identified. No evidence of fracture or dislocation.  IMPRESSION: Negative.   Electronically Signed   By: Maisie Fus  Register   On: 02/28/2015 14:18   US Venous Img Lower Unilateral Left  02/28/2015   CLINICAL DATA:  Left lower extremity swelling for 2 months.  EXAM: LEFT LOWER EXTREMITY VENOUS DOPPLER ULTRASOUND  TECHNIQUE: Gray-scale sonography with graded compression, as well as color Doppler and duplex ultrasound were performed to evaluate the lower extremity deep venous systems from the level of the common femoral vein and including the common femoral, femoral, profunda femoral, popliteal and calf veins including the posterior tibial, peroneal and gastrocnemius veins when visible. The superficial great saphenous vein was also interrogated. Spectral Doppler was utilized to evaluate flow at rest and with distal augmentation maneuvers in the common femoral, femoral and popliteal veins.  COMPARISON:  None.  FINDINGS: Contralateral Common Femoral Vein: Respiratory phasicity is normal and symmetric with the symptomatic side. No evidence of thrombus. Normal compressibility.  Common Femoral Vein: No evidence of thrombus. Normal compressibility, respiratory phasicity and response to  augmentation.  Saphenofemoral Junction: No evidence of thrombus. Normal compressibility and flow on color Doppler imaging.  Profunda Femoral Vein: No evidence of thrombus. Normal compressibility and flow on color Doppler imaging.  Femoral Vein: No evidence of thrombus. Normal compressibility, respiratory phasicity and response to augmentation.  Popliteal Vein: No evidence of thrombus. Normal compressibility, respiratory phasicity and response to augmentation.  Calf Veins: No evidence of thrombus. Normal compressibility and flow on color Doppler imaging.  Superficial Great Saphenous Vein: No evidence of thrombus. Normal compressibility and flow on color Doppler imaging.  Venous Reflux:  None.  Other Findings:  None.  IMPRESSION: No evidence of deep venous thrombosis.   Electronically Signed   By: Myles Rosenthal M.D.   On: 02/28/2015 17:11    Scheduled Meds: . albuterol  2.5 mg Nebulization Q6H  . antiseptic oral rinse  7 mL Mouth Rinse q12n4p  . budesonide (PULMICORT) nebulizer solution  0.25 mg Nebulization BID  . chlorhexidine  15 mL Mouth Rinse BID  . escitalopram  10 mg Oral Daily  . fluticasone  2 spray Each Nare Daily  . loratadine  10 mg Oral Daily  . pantoprazole  80 mg Oral Q1200  . potassium chloride  40 mEq Oral BID  . tiotropium  18 mcg Inhalation Daily  . vancomycin  1,000 mg Intravenous Q24H  . warfarin  1.25 mg Oral Once per day on Mon Wed Fri Sat  . [START ON 03/02/2015] warfarin  2.5 mg Oral Once per day on Sun Tue Thu  . Warfarin - Pharmacist Dosing Inpatient   Does not apply q1800   Continuous Infusions:    Marinda Elk  [facility] Pager [phone] If 7PM-7AM, please contact night-coverage at [email or web address], password [facility] 03/01/2015, 7:58 AM  LOS: 1 day

## 2015-03-01 NOTE — Progress Notes (Addendum)
Initial Nutrition Assessment  DOCUMENTATION CODES:  Non-severe (moderate) malnutrition in context of acute illness/injury  INTERVENTION: - Will order 30 mL Prostat BID, each supplement provides 100 kcal and 15 grams of protein - RD will continue to monitor for needs  NUTRITION DIAGNOSIS:  Inadequate oral intake related to acute illness as evidenced by per patient/family report, meal completion < 50%.  GOAL:  Patient will meet greater than or equal to 90% of their needs  MONITOR:  PO intake, Supplement acceptance, Weight trends, Labs, I & O's  REASON FOR ASSESSMENT:  Malnutrition Screening Tool  ASSESSMENT: 80yo woman with PMH of OA, Arib, pulmonary HTN, GERD, CHF, depression who presents due to increased fatigue and SOB. She had a TTE last year which showed a PA pressure of 66 making pulmonary HTN likely. She is on home O2 for chronic respiratory failure. Apparently, over the past few days she has not wanted to take her lasix b/c she did not want to have to go to the bathroom so much. She presented to her PCP today and was noted to have pOx of 89% on her home O2 of 4L. She was also noted to only be able to walk about 10 feet when she needed to stop and rest. Further symptoms include decreased appetite. Her husband also noted that she has had progressive swelling of her left leg with erythema of that leg and pitting edema.  Pt seen for MST. BMI indicates overweight status. Physical assessment shows mild fat wasting, moderate edema. Pt reports UBW of 146 lbs and that she last weighed this 3 weeks ago indicating 18 lb weight loss (12% body weight) in this time frame; some weight loss may be attributable to fluid.  Pt ate 75% of bagel for breakfast this AM and the rest of the tray was untouched. Husband states that pt's appetite has been poor for 3 weeks and that during this time, she would sometimes go 1-2 days without eating.   Pt does not drink nutrition supplements at home  but she was taking supplements to include: multivitamin, calcium, and D3. Pt not meeting needs now or PTA; will trial Prostat. Medications reviewed. Labs reviewed; Na: 132 mmol/L, K: 3.4 mmol/L, Cl: 96 mmol/L, Ca: 8.6 mg/dL.  ADDENDUM: Pt denies difficulty with chewing or swallowing even despite SOB.  Height:  Ht Readings from Last 1 Encounters:  02/03/15  (1.499 m)    Weight:  Wt Readings from Last 1 Encounters:  03/01/15 128 lb 4.9 oz (58.2 kg)    Ideal Body Weight:  44.1 kg (kg)  Wt Readings from Last 10 Encounters:  03/01/15 128 lb 4.9 oz (58.2 kg)  02/28/15 132 lb 6.4 oz (60.056 kg)  02/03/15 140 lb 9.6 oz (63.776 kg)  01/16/15 142 lb (64.411 kg)  12/17/14 139 lb (63.05 kg)  12/10/14 139 lb 12.8 oz (63.413 kg)  08/08/14 141 lb 12.8 oz (64.32 kg)  05/08/14 141 lb 9.6 oz (64.229 kg)  04/01/14 143 lb (64.864 kg)  01/23/14 148 lb 9.6 oz (67.405 kg)    BMI:  Body mass index is 25.9 kg/(m^2).  Estimated Nutritional Needs:  Kcal:  1300-1500  Protein:  55-65 grams  Fluid:  1.7 L/day  Skin:  Wound (see comment) (L leg cellulitis)  Diet Order:  Diet Heart Room service appropriate?: Yes; Fluid consistency:: Thin  EDUCATION NEEDS:  No education needs identified at this time   Intake/Output Summary (Last 24 hours) at 03/01/15 0902 Last data filed at 03/01/15 0830  Gross per 24 hour  Intake      0 ml  Output   2800 ml  Net  -2800 ml    Last BM:  PTA    Trenton Gammon, RD, LDN Inpatient Clinical Dietitian Pager # [phone] After hours/weekend pager # [phone]

## 2015-03-01 NOTE — Progress Notes (Signed)
RT assessment: Attempted to titrate patient from BIPAP to [place] this morning. Pt states she feels much better that at admission. NO distress noted. NO increased WOB/SOB. Adjusted patient to 6L [place]: Pt dropped Sats to 91-92%. No distress. Assisted patient with breathing exercise. Left patient for 30 minutes Sats gradually dropped and maintained at 88%. RT changed to 100% NRB. Sats slowly increased to 95-96%. Pt is comfortable at this time and resting. BIPAP available at beside if needed. RN aware. RT will monitor.

## 2015-03-02 DIAGNOSIS — E440 Moderate protein-calorie malnutrition: Secondary | ICD-10-CM | POA: Insufficient documentation

## 2015-03-02 DIAGNOSIS — E43 Unspecified severe protein-calorie malnutrition: Secondary | ICD-10-CM

## 2015-03-02 DIAGNOSIS — I270 Primary pulmonary hypertension: Secondary | ICD-10-CM

## 2015-03-02 LAB — HEPATITIS PANEL, ACUTE
HCV Ab: <0.1 s/co ratio (ref 0.0–0.9)
Hep A IgM: NEGATIVE
Hep B C IgM: NEGATIVE
Hepatitis B Surface Ag: NEGATIVE

## 2015-03-02 LAB — PROTIME-INR
INR: 2.17 — ABNORMAL HIGH (ref 0.00–1.49)
Prothrombin Time: 24 seconds — ABNORMAL HIGH (ref 11.6–15.2)

## 2015-03-02 MED ORDER — DOXYCYCLINE HYCLATE 100 MG PO TABS
100.0000 mg | ORAL_TABLET | Freq: Two times a day (BID) | ORAL | Status: DC
  Administered 2015-03-02 – 2015-03-04 (×5): 100 mg via ORAL
  Filled 2015-03-02 (×6): qty 1

## 2015-03-02 MED ORDER — ALBUTEROL SULFATE (2.5 MG/3ML) 0.083% IN NEBU: 2.5000 mg | INHALATION_SOLUTION | Freq: Four times a day (QID) | RESPIRATORY_TRACT | Status: DC | PRN

## 2015-03-02 MED ORDER — ALBUTEROL SULFATE (2.5 MG/3ML) 0.083% IN NEBU
2.5000 mg | INHALATION_SOLUTION | Freq: Two times a day (BID) | RESPIRATORY_TRACT | Status: DC
  Administered 2015-03-02 – 2015-03-03 (×2): 2.5 mg via RESPIRATORY_TRACT
  Filled 2015-03-02 (×2): qty 3

## 2015-03-02 NOTE — Progress Notes (Signed)
PHARMACIST - PHYSICIAN COMMUNICATION  CONCERNING: Antibiotic IV to Oral Route Change Policy  RECOMMENDATION: This patient is receiving doxycycline by the intravenous route.  Based on criteria approved by the Pharmacy and Therapeutics Committee, the antibiotic(s) is/are being converted to the equivalent oral dose form(s).   DESCRIPTION: These criteria include:  Patient being treated for a respiratory tract infection, urinary tract infection, cellulitis or clostridium difficile associated diarrhea if on metronidazole  The patient is not neutropenic and does not exhibit a GI malabsorption state  The patient is eating (either orally or via tube) and/or has been taking other orally administered medications for a least 24 hours  The patient is improving clinically and has a Tmax < 100.5  If you have questions about this conversion, please contact the Pharmacy Department  []   [phone] )  [facility] []   [phone] )  [facility] []   [phone] )  [facility] []   [phone] )  [facility] []   [phone] )  [facility]   Harland German, [place] D 03/02/2015 7:58 AM

## 2015-03-02 NOTE — Progress Notes (Signed)
ANTICOAGULATION CONSULT NOTE  Pharmacy Consult for Warfarin  Indication: atrial fibrillation  Allergies  Allergen Reactions  . Benzoyl Peroxide Swelling    Swelling at the site of use  . Neomycin-Bacitracin Zn-Polymyx Swelling    Swelling at the site of use    Patient Measurements: Height: 4\' 11"  (149.9 cm) Weight: 126 lb 5.2 oz (57.3 kg) IBW/kg (Calculated) : 43.2  Vital Signs: Temp: 97.8 F (36.6 C) (07/10 0500) Temp Source: Oral (07/10 0500) BP: 137/83 mmHg (07/10 0500) Pulse Rate: 83 (07/10 0500)  Labs:  Recent Labs  02/28/15 1354  03/01/15 0108 03/01/15 0115 03/01/15 0604 03/01/15 1350 03/02/15 0308  HGB 13.8  --   --   --  14.4  --   --   HCT 41.5  --   --   --  43.0  --   --   PLT 332  --   --   --  293  --   --   LABPROT  --   --   --  27.1*  --   --  24.0*  INR  --   --   --  2.56*  --   --  2.17*  CREATININE 0.65  --   --   --  0.78  --   --   TROPONINI 0.08*  < > 0.10*  --  0.10* 0.09*  --   < > = values in this interval not displayed.  Estimated Creatinine Clearance: 45.4 mL/min (by C-G formula based on Cr of 0.78).   Medical History: Past Medical History  Diagnosis Date  . Asthma   . Rhinitis   . Osteoarthritis   . Osteoporosis   . Peptic ulcer disease   . Depression   . Atrial fibrillation     s/p ablation   . Takotsubo cardiomyopathy     resolved  . Shingles   . Eosinophilia   . Sinoatrial node dysfunction   . GERD (gastroesophageal reflux disease)   . Bronchiectasis   . Peptic ulcer disease   . Osteoarthritis   . Herpes zoster   . Hyperlipemia   . Injury of splenic artery     infarct    Assessment: 80 y/o F here with acute on chronic respiratory failure/cellulitis, takes warfarin PTA and pharmacy consulted to dose.  -INR today is 2.17 and at goal   Warfarin home dose per outpatient anti-coag notes: 2.5 mg on Tues/Thurs/Sun, 1.25 mg all other days  Goal of Therapy:  INR 2-3 Monitor platelets by anticoagulation protocol:  Yes   Plan:  -Continue warfarin home dose as above -Daily PT/INR  Harland German, Pharm D 03/02/2015 8:02 AM

## 2015-03-02 NOTE — Progress Notes (Signed)
[facility] PROGRESS NOTE Interim History: 80yo woman with PMH of OA, Arib, pulmonary HTN, GERD, CHF, depression who presents due to increased fatigue and SOB. She had a TTE last year which showed a PA pressure of 66 making pulmonary HTN likely. She is on home O2 for chronic respiratory failure. Apparently, over the past few days she has not wanted to take her lasix b/c she did not want to have to go to the bathroom so much. She presented to her PCP today and was noted to have pOx of 89% on her home O2 of 4L. She was also noted to only be able to walk about 10 feet when she needed to stop and rest due to her SOB.  Filed Weights   03/01/15 0800 03/01/15 1514 03/02/15 0500  Weight: 58.2 kg (128 lb 4.9 oz) 58 kg (127 lb 13.9 oz) 57.3 kg (126 lb 5.2 oz)        Intake/Output Summary (Last 24 hours) at 03/02/15 1026 Last data filed at 03/02/15 0600  Gross per 24 hour  Intake    920 ml  Output   1075 ml  Net   -155 ml     Assessment/Plan: Acute on chronic respiratory failure  multifactorial due to  Acute diastolic heart failure and pulmonary lung disease: - Started on IV lasix with good diuresis, restricted her diet. She seems to be Euvolemic and relates her SOB is not improved. - monitor lytes. Bp tolerating diuresis. Cr stable. - not on Beta blockers. Tolerate it ACE-I - On nasal canula. - consult pulmonary as primary lung problem might be driving her worsening SOB.  Cellulitis of left lower extremity - Started on IV vancomycin on 7.8.2016, afebrile, erythema receding. - change to IV doxy.  Pulmonary hypertension  Atrial fibrillation-persistent - rate controlled. INR therapeutic.    Code Status: full Family Communication: husband  Disposition Plan: home in 2-3 days   Consultants:  none  Procedures: ECHO: none  Antibiotics:  vanc  HPI/Subjective: Relates SOB is not better, she was wean off her non re breather.  Objective: Filed Vitals:   03/02/15  0145 03/02/15 0148 03/02/15 0500 03/02/15 0718  BP: 121/64  137/83   Pulse: 83  83   Temp: 97.9 F (36.6 C)  97.8 F (36.6 C)   TempSrc: Oral  Oral   Resp: 22  20   Height:      Weight:   57.3 kg (126 lb 5.2 oz)   SpO2: 90% 92% 91% 83%     Exam:  General: Alert, awake, oriented x3, in no acute distress.  HEENT: No bruits, no goiter. - JVD Heart: Regular rate and rhythm. Lungs: Good air movement, crackles bilaterally Abdomen: Soft, nontender, nondistended, positive bowel sounds.  Neuro: Grossly intact, nonfocal.   Data Reviewed: Basic Metabolic Panel:  Recent Labs Lab 02/28/15 1354 03/01/15 0604  NA 131* 132*  K 4.3 3.4*  CL 100* 96*  CO2 22 25  GLUCOSE 107* 87  BUN 10 8  CREATININE 0.65 0.78  CALCIUM 8.9 8.6*   Liver Function Tests:  Recent Labs Lab 02/28/15 1354  AST 80*  ALT 68*  ALKPHOS 120  BILITOT 0.9  PROT 7.1  ALBUMIN 3.4*   No results for input(s): LIPASE, AMYLASE in the last 168 hours. No results for input(s): AMMONIA in the last 168 hours. CBC:  Recent Labs Lab 02/28/15 1354 03/01/15 0604  WBC 8.4 8.6  HGB 13.8 14.4  HCT 41.5 43.0  MCV 86.8 86.7  PLT 332 293   Cardiac Enzymes:  Recent Labs Lab 02/28/15 1354 02/28/15 2005 03/01/15 0108 03/01/15 0604 03/01/15 1350  TROPONINI 0.08* 0.12* 0.10* 0.10* 0.09*   BNP (last 3 results)  Recent Labs  02/28/15 1354 03/01/15 0108  BNP 393.9* 491.9*    ProBNP (last 3 results) No results for input(s): PROBNP in the last 8760 hours.  CBG: No results for input(s): GLUCAP in the last 168 hours.  Recent Results (from the past 240 hour(s))  MRSA PCR Screening     Status: None   Collection Time: 02/28/15 11:22 PM  Result Value Ref Range Status   MRSA by PCR NEGATIVE NEGATIVE Final    Comment:        The GeneXpert MRSA Assay (FDA approved for NASAL specimens only), is one component of a comprehensive MRSA colonization surveillance program. It is not intended to diagnose  MRSA infection nor to guide or monitor treatment for MRSA infections.      Studies: Dg Chest 2 View  02/28/2015   CLINICAL DATA:  Shortness of breath and lower extremity pitting edema ; history of atrial fibrillation and asthma  EXAM: CHEST  2 VIEW  COMPARISON:  PA and lateral chest of Jan 17, 2015  FINDINGS: The lungs are mildly hyperinflated. The interstitial markings are increased. The pulmonary vascularity is engorged. The cardiac silhouette is mildly enlarged. A permanent pacemaker is in place with appropriate positioning of the electrodes. There is no pleural effusion. The bony thorax exhibits no acute abnormality.  IMPRESSION: Reactive airway disease with superimposed CHF and mild interstitial edema. There is no alveolar pneumonia.   Electronically Signed   By: David  [place] M.D.   On: 02/28/2015 14:17   Dg Wrist Complete Right  02/28/2015   CLINICAL DATA:  Wrist pain.  No known injury.  Initial evaluation.  EXAM: RIGHT WRIST - COMPLETE 3+ VIEW  COMPARISON:  None.  FINDINGS: Diffuse osteopenia and degenerative change. No acute bony or joint abnormality identified. No evidence of fracture or dislocation.  IMPRESSION: Negative.   Electronically Signed   By: Maisie Fus  Register   On: 02/28/2015 14:18   US Venous Img Lower Unilateral Left  02/28/2015   CLINICAL DATA:  Left lower extremity swelling for 2 months.  EXAM: LEFT LOWER EXTREMITY VENOUS DOPPLER ULTRASOUND  TECHNIQUE: Gray-scale sonography with graded compression, as well as color Doppler and duplex ultrasound were performed to evaluate the lower extremity deep venous systems from the level of the common femoral vein and including the common femoral, femoral, profunda femoral, popliteal and calf veins including the posterior tibial, peroneal and gastrocnemius veins when visible. The superficial great saphenous vein was also interrogated. Spectral Doppler was utilized to evaluate flow at rest and with distal augmentation maneuvers in the common  femoral, femoral and popliteal veins.  COMPARISON:  None.  FINDINGS: Contralateral Common Femoral Vein: Respiratory phasicity is normal and symmetric with the symptomatic side. No evidence of thrombus. Normal compressibility.  Common Femoral Vein: No evidence of thrombus. Normal compressibility, respiratory phasicity and response to augmentation.  Saphenofemoral Junction: No evidence of thrombus. Normal compressibility and flow on color Doppler imaging.  Profunda Femoral Vein: No evidence of thrombus. Normal compressibility and flow on color Doppler imaging.  Femoral Vein: No evidence of thrombus. Normal compressibility, respiratory phasicity and response to augmentation.  Popliteal Vein: No evidence of thrombus. Normal compressibility, respiratory phasicity and response to augmentation.  Calf Veins: No evidence of thrombus. Normal compressibility and flow on color Doppler imaging.  Superficial  Great Saphenous Vein: No evidence of thrombus. Normal compressibility and flow on color Doppler imaging.  Venous Reflux:  None.  Other Findings:  None.  IMPRESSION: No evidence of deep venous thrombosis.   Electronically Signed   By: Myles Rosenthal M.D.   On: 02/28/2015 17:11    Scheduled Meds: . albuterol  2.5 mg Nebulization BID  . antiseptic oral rinse  7 mL Mouth Rinse q12n4p  . atorvastatin  10 mg Oral q1800  . budesonide (PULMICORT) nebulizer solution  0.25 mg Nebulization BID  . chlorhexidine  15 mL Mouth Rinse BID  . doxycycline  100 mg Oral Q12H  . escitalopram  10 mg Oral Daily  . feeding supplement (PRO-STAT SUGAR FREE 64)  30 mL Oral BID  . fluticasone  2 spray Each Nare Daily  . furosemide  40 mg Intravenous BID  . lisinopril  2.5 mg Oral Daily  . loratadine  10 mg Oral Daily  . pantoprazole  80 mg Oral Q1200  . tiotropium  18 mcg Inhalation Daily  . traZODone  200 mg Oral QHS  . warfarin  1.25 mg Oral Once per day on Mon Wed Fri Sat  . warfarin  2.5 mg Oral Once per day on Sun Tue Thu  .  Warfarin - Pharmacist Dosing Inpatient   Does not apply q1800   Continuous Infusions:    Marinda Elk  [facility] Pager [phone] If 7PM-7AM, please contact night-coverage at [email or web address], password [facility] 03/02/2015, 10:26 AM  LOS: 2 days

## 2015-03-02 NOTE — Consult Note (Signed)
Name: [name] MRN: [id number] DOB: November 19, 1934    ADMISSION DATE:  02/28/2015 CONSULTATION DATE:  03/02/2015  REFERRING MD :  David Stall  CHIEF COMPLAINT:  SOB  BRIEF PATIENT DESCRIPTION: 80 y.o. F with PMH of PAH, bronchiectasis, DOE, admitted 02/28/15 with acute on chronic hypoxemic respiratory failure.  Had persistent SOB AM 03/02/15 therefore PCCM consulted for further recs.  SIGNIFICANT EVENTS  7/8 - admitted. 7/10 - PCCM consulted.  STUDIES:  CXR 7/8 >>> reactive airway dz with superimposed CHF and mild edema.   HISTORY OF PRESENT ILLNESS:  BRITZEL [name] is a 80 y.o. F with PMH as outlined below, including chronic hypoxemic respiratory failure (on 4L O2), PAH, bronchiectasis.  She presented to [facility] ED 02/28/15 with increased fatigue and SOB.  She is followed by Dr. Delton Coombes for BTX, DOE, PAH.  Recent echo 12/25/14 showed LEVEF 55 - 60%, mild bilateral atrial enlargement, PASP 66 mmHg (increased from 38 2 years prior).  On admission, pt reported that she had stopped taking her lasix over the few days prior due to not wanting to go to the bathroom as much.   She had no fevers/chills/sweats, chest pain, cough,N/V/D, abdominal pain, myalgias.  Saw Dr. Delton Coombes 5/26 for persistent SOB and DOE.  Had VQ done which was negative.  Also had LE dopplers for unilateral LE edema which was also negative.  ANA and RF sent which were both positive (RF 49).  Anti-SCL 70 was negative.  Dr. Delton Coombes had felt that pt would require right heart cath soon; however, was awaiting blood work results before determining optimal timing.    On AM of 7/10, pt's SOB was not improving, therefore, pulmonary consult was requested.  On my exam, pt states she feels that her breathing is at her baseline.  She is currently on 4L O2 with SpO2 in mid 90's.  She reports that she feels comfortable and was able to finish her dinner easily for the first time in a few days.  Her husband is at the bedside and confirms that Mrs. Percy does indeed  appear to be improving.  She does report   PAST MEDICAL HISTORY :   has a past medical history of Asthma; Rhinitis; Osteoarthritis; Osteoporosis; Peptic ulcer disease; Depression; Atrial fibrillation; Takotsubo cardiomyopathy; Shingles; Eosinophilia; Sinoatrial node dysfunction; GERD (gastroesophageal reflux disease); Bronchiectasis; Peptic ulcer disease; Osteoarthritis; Herpes zoster; Hyperlipemia; and Injury of splenic artery.  has past surgical history that includes Cholecystectomy; Tonsillectomy; Cataract extraction; Pacemaker insertion (09/29/06); and Cosmetic surgery. Prior to Admission medications   Medication Sig Start Date End Date Taking? Authorizing Provider  albuterol (PROAIR HFA) 108 (90 BASE) MCG/ACT inhaler Inhale 2 puffs into the lungs every 4 (four) hours as needed for wheezing or shortness of breath. 11/29/13  Yes Leslye Peer, MD  Ascorbic Acid (VITAMIN C) 1000 MG tablet Take 1,000 mg by mouth daily.      Historical Provider, MD  beclomethasone (QVAR) 80 MCG/ACT inhaler Inhale 2 puffs into the lungs 2 (two) times daily. 05/08/14   Leslye Peer, MD  BEPREVE 1.5 % SOLN Place 1 drop into both eyes at bedtime.  04/07/13   Historical Provider, MD  budesonide (RHINOCORT AQUA) 32 MCG/ACT nasal spray Place 1 spray into both nostrils daily. 01/23/14   Leslye Peer, MD  Calcium Carbonate (CALCIUM 600) 1500 MG TABS Take 1 tablet by mouth daily.      Historical Provider, MD  Cholecalciferol (VITAMIN D3) 1000 UNITS CAPS Take 1 capsule by mouth  daily.      Historical Provider, MD  escitalopram (LEXAPRO) 10 MG tablet TAKE 1 TABLET (10 MG TOTAL) BY MOUTH DAILY. 10/22/14   Lelon Perla, DO  esomeprazole (NEXIUM) 40 MG capsule TAKE ONE CAPSULE BY MOUTH EVERY DAY BEFORE BREAKFAST Patient taking differently: Take 40 mg by mouth at bedtime.  05/22/14   Grayling Congress Lowne, DO  fluticasone (FLONASE) 50 MCG/ACT nasal spray Place 2 sprays into both nostrils 2 (two) times daily. 10/17/13   Leslye Peer, MD   furosemide (LASIX) 20 MG tablet Take as directed. Patient taking differently: Take 20 mg by mouth daily after supper. 20 mg M, W, F, Sat, then 40 mg Tues, Th, Sun 12/10/14   Duke Salvia, MD  loratadine (CLARITIN) 10 MG tablet Take 10 mg by mouth daily.    Historical Provider, MD  Multiple Vitamin (MULTIVITAMIN) tablet Take 1 tablet by mouth daily.      Historical Provider, MD  PATADAY 0.2 % SOLN INSTILL 1 DROP IN BOTH EYES DAILY 12/24/14   Historical Provider, MD  tiotropium (SPIRIVA) 18 MCG inhalation capsule Place 1 capsule (18 mcg total) into inhaler and inhale daily. 08/08/14   Leslye Peer, MD  traZODone (DESYREL) 100 MG tablet TAKE 2 TABLETS BY MOUTH AT BEDTIME 02/17/15   Lelon Perla, DO  warfarin (COUMADIN) 2.5 MG tablet Take as directed by [facility] Patient taking differently: Take 2.5 mg by mouth daily at 6 PM. 2.5 mg on Tues and Thurs.  1.25 mg all other days. 01/13/15   Duke Salvia, MD   Allergies  Allergen Reactions  . Benzoyl Peroxide Swelling    Swelling at the site of use  . Neomycin-Bacitracin Zn-Polymyx Swelling    Swelling at the site of use    FAMILY HISTORY:  family history includes Asthma in her mother; Coronary artery disease in an other family member; Depression in her father; Emphysema in her sister; Liver cancer in her son. SOCIAL HISTORY:  reports that she has never smoked. She has never used smokeless tobacco. She reports that she does not drink alcohol or use illicit drugs.  REVIEW OF SYSTEMS:   All negative; except for those that are bolded, which indicate positives.  Constitutional: weight loss, weight gain, night sweats, fevers, chills, fatigue, weakness.  HEENT: headaches, sore throat, sneezing, nasal congestion, post nasal drip, difficulty swallowing, tooth/dental problems, visual complaints, visual changes, ear aches. Neuro: difficulty with speech, weakness, numbness, ataxia. CV:  chest pain, orthopnea, PND, swelling in lower extremities,  dizziness, palpitations, syncope, DOE. Resp: cough, hemoptysis, dyspnea (though improved and feels at baseline), wheezing. GI  heartburn, indigestion, abdominal pain, nausea, vomiting, diarrhea, constipation, change in bowel habits, loss of appetite, hematemesis, melena, hematochezia.  GU: dysuria, change in color of urine, urgency or frequency, flank pain, hematuria. MSK: joint pain or swelling, decreased range of motion. Psych: change in mood or affect, depression, anxiety, suicidal ideations, homicidal ideations. Skin: rash, itching, bruising.   SUBJECTIVE:   VITAL SIGNS: Temp:  [97.7 F (36.5 C)-97.9 F (36.6 C)] 97.7 F (36.5 C) (07/10 1417) Pulse Rate:  [70-83] 70 (07/10 1417) Resp:  [20-24] 22 (07/10 1417) BP: (111-137)/(62-83) 111/62 mmHg (07/10 1417) SpO2:  [83 %-98 %] 98 % (07/10 1417) Weight:  [57.3 kg (126 lb 5.2 oz)] 57.3 kg (126 lb 5.2 oz) (07/10 0500)  PHYSICAL EXAMINATION: General: Chronically ill appearing female, resting in bed, in NAD. Neuro: A&O x 3, non-focal.  HEENT: [place]/AT. PERRL, sclerae anicteric. 4L O2  via [place] in place. Cardiovascular: IRIR, no M/R/G.  Lungs: Respirations even and unlabored.  Faint basilar crackles. Abdomen: BS x 4, soft, NT/ND.  Musculoskeletal: No gross deformities, no edema.  Skin: Intact, warm, no rashes.    Recent Labs Lab 02/28/15 1354 03/01/15 0604  NA 131* 132*  K 4.3 3.4*  CL 100* 96*  CO2 22 25  BUN 10 8  CREATININE 0.65 0.78  GLUCOSE 107* 87    Recent Labs Lab 02/28/15 1354 03/01/15 0604  HGB 13.8 14.4  HCT 41.5 43.0  WBC 8.4 8.6  PLT 332 293   No results found.  ASSESSMENT / PLAN:  Acute on chronic hypoxic respiratory failure - on 4L O2 chronically. PAH (PASP 66 by echo 12/25/14) - severe; likely combination of group 2 from underlying dCHF and group 3 from chronic lung disease. ? ILD / rheumatoid lung - ANA and RF both positive. Plan: Continue supplemental O2 as needed to maintain SpO2 > 90%. Continue  BD's (if she deteriorates, then would switch all to nebulized as this would enhance delivery during her acute illness). No role for steroids given that pt reports she feels at baseline and lung exam is essentially clear. Pulmonary hygiene. Incentive spirometry. Mobilize as able. Consider RHC during this admission - attending to follow. CXR intermittently.  Diastolic CHF. Atrial fibrillation - rate controlled. Plan: Continue diuresis; though caution with overshooting given severe PAH. Continue warfarin.  Rest per primary.   Rutherford Guys, [place] [facility] Pager: [phone]  or [phone] 03/02/2015, 8:02 PM

## 2015-03-02 NOTE — Progress Notes (Signed)
Distributed and instructed on use of IS RT will continue to monitor.

## 2015-03-02 NOTE — Progress Notes (Signed)
eLink Physician-Brief Progress Note Patient Name: [name] DOB: 09/06/1934 MRN: [id number]   Date of Service  03/02/2015  HPI/Events of Note  80 yo with PMHx of dCHF (mild), ?ILD, pHTN (PASP 66) admitted for dyspnea, not improving since admission, still with O2 requirement  eICU Interventions  FULL CONSULT NOTE to FOLLOW  Dyspnea - multifactorial - dchf, ILD - cont with gentle diuresis, do not over dry - bronchdilators as needed - sats >88% - cont with supplemental O2 - intermittent bipap (2hrs in the AM, 2 hrs in the afternoon, min 4hrs QHS)       Intervention Category Evaluation Type: New Patient Evaluation  [name] 03/02/2015, 3:57 PM

## 2015-03-03 DIAGNOSIS — I2721 Secondary pulmonary arterial hypertension: Secondary | ICD-10-CM

## 2015-03-03 DIAGNOSIS — I272 Other secondary pulmonary hypertension: Secondary | ICD-10-CM

## 2015-03-03 DIAGNOSIS — J9621 Acute and chronic respiratory failure with hypoxia: Secondary | ICD-10-CM

## 2015-03-03 DIAGNOSIS — I5033 Acute on chronic diastolic (congestive) heart failure: Principal | ICD-10-CM

## 2015-03-03 DIAGNOSIS — I480 Paroxysmal atrial fibrillation: Secondary | ICD-10-CM

## 2015-03-03 DIAGNOSIS — I2510 Atherosclerotic heart disease of native coronary artery without angina pectoris: Secondary | ICD-10-CM

## 2015-03-03 DIAGNOSIS — I4891 Unspecified atrial fibrillation: Secondary | ICD-10-CM

## 2015-03-03 LAB — BASIC METABOLIC PANEL
Anion gap: 10 (ref 5–15)
BUN: 15 mg/dL (ref 6–20)
CO2: 27 mmol/L (ref 22–32)
CREATININE: 0.71 mg/dL (ref 0.44–1.00)
Calcium: 8.6 mg/dL — ABNORMAL LOW (ref 8.9–10.3)
Chloride: 94 mmol/L — ABNORMAL LOW (ref 101–111)
GLUCOSE: 120 mg/dL — AB (ref 65–99)
Potassium: 4.3 mmol/L (ref 3.5–5.1)
SODIUM: 131 mmol/L — AB (ref 135–145)

## 2015-03-03 LAB — PROTIME-INR
INR: 2.4 — ABNORMAL HIGH (ref 0.00–1.49)
Prothrombin Time: 25.9 seconds — ABNORMAL HIGH (ref 11.6–15.2)

## 2015-03-03 MED ORDER — FUROSEMIDE 40 MG PO TABS
40.0000 mg | ORAL_TABLET | Freq: Two times a day (BID) | ORAL | Status: DC
  Administered 2015-03-04: 40 mg via ORAL
  Filled 2015-03-03 (×3): qty 1

## 2015-03-03 MED ORDER — BUDESONIDE 0.25 MG/2ML IN SUSP
0.2500 mg | Freq: Two times a day (BID) | RESPIRATORY_TRACT | Status: DC
  Administered 2015-03-03 – 2015-03-07 (×9): 0.25 mg via RESPIRATORY_TRACT
  Filled 2015-03-03 (×12): qty 2

## 2015-03-03 MED ORDER — FUROSEMIDE 40 MG PO TABS: 40.0000 mg | ORAL_TABLET | Freq: Two times a day (BID) | ORAL | Status: DC

## 2015-03-03 MED ORDER — BUDESONIDE 0.25 MG/2ML IN SUSP: 0.2500 mg | Freq: Two times a day (BID) | RESPIRATORY_TRACT | Status: DC

## 2015-03-03 NOTE — Consult Note (Signed)
Reason for Consult: Pulmonary artery HTN  Requesting Physician: David Stall  Cardiologist: Graciela Husbands  HPI: This is a 80 y.o. female with a past medical history significant for permanent atrial fibrillation with slow ventricular response, s/p dual chamber pacemaker programmed VVIR, remote Takotsubo sd with recovered LV function, bronchiectasis and moderate obstructive lung disease, abnormal rheumatological studies (RA and ANA), no evidence of pulmonary embolism or sleep apnea. Over the last year she has developed worsening dyspnea and hypoxemia, requiring O2 4L/min at rest and more recently edema and weight gain prompting current hospitalization. She has diuresed promptly with IV loop diuretics (-3.8 L, weight decreased 7 lb), but dyspnea and need for O2 supplementation persists.   PMHx:  Past Medical History  Diagnosis Date  . Asthma   . Rhinitis   . Osteoarthritis   . Osteoporosis   . Peptic ulcer disease   . Depression   . Atrial fibrillation     s/p ablation   . Takotsubo cardiomyopathy     resolved  . Shingles   . Eosinophilia   . Sinoatrial node dysfunction   . GERD (gastroesophageal reflux disease)   . Bronchiectasis   . Peptic ulcer disease   . Osteoarthritis   . Herpes zoster   . Hyperlipemia   . Injury of splenic artery     infarct   Past Surgical History  Procedure Laterality Date  . Cholecystectomy    . Tonsillectomy    . Cataract extraction    . Pacemaker insertion  09/29/06    MDT ADDR01 dual chamber pacemaker implanted for SSS - programmed VVIR 2/2 permanent atrial fibrillation  . Cosmetic surgery      FAMHx: Family History  Problem Relation Age of Onset  . Coronary artery disease      femal 1st degree relative  . Liver cancer Son   . Emphysema Sister   . Asthma Mother   . Depression Father     nervous breakdown    SOCHx:  reports that she has never smoked. She has never used smokeless tobacco. She reports that she does not drink alcohol  or use illicit drugs.  ALLERGIES: Allergies  Allergen Reactions  . Benzoyl Peroxide Swelling    Swelling at the site of use  . Neomycin-Bacitracin Zn-Polymyx Swelling    Swelling at the site of use    ROS: Pertinent items are noted in HPI. All other systems reviewed and negative.  HOME MEDICATIONS: Prescriptions prior to admission  Medication Sig Dispense Refill Last Dose  . albuterol (PROAIR HFA) 108 (90 BASE) MCG/ACT inhaler Inhale 2 puffs into the lungs every 4 (four) hours as needed for wheezing or shortness of breath. 1 Inhaler 11 Past Month at Unknown time  . Ascorbic Acid (VITAMIN C) 1000 MG tablet Take 1,000 mg by mouth daily.     02/27/2015  . beclomethasone (QVAR) 80 MCG/ACT inhaler Inhale 2 puffs into the lungs 2 (two) times daily. 1 Inhaler 6 02/27/2015  . BEPREVE 1.5 % SOLN Place 1 drop into both eyes at bedtime.    02/26/2015  . budesonide (RHINOCORT AQUA) 32 MCG/ACT nasal spray Place 1 spray into both nostrils daily. 1 Bottle 6 02/27/2015  . Calcium Carbonate (CALCIUM 600) 1500 MG TABS Take 1 tablet by mouth daily.     02/27/2015  . Cholecalciferol (VITAMIN D3) 1000 UNITS CAPS Take 1 capsule by mouth daily.     02/27/2015  . escitalopram (LEXAPRO) 10 MG tablet TAKE 1 TABLET (10 MG TOTAL)  BY MOUTH DAILY. 30 tablet 5 02/27/2015  . esomeprazole (NEXIUM) 40 MG capsule TAKE ONE CAPSULE BY MOUTH EVERY DAY BEFORE BREAKFAST (Patient taking differently: Take 40 mg by mouth at bedtime. ) 30 capsule 5 02/26/2015  . fluticasone (FLONASE) 50 MCG/ACT nasal spray Place 2 sprays into both nostrils 2 (two) times daily. 16 g 11 02/27/2015  . furosemide (LASIX) 20 MG tablet Take as directed. (Patient taking differently: Take 20 mg by mouth daily after supper. 20 mg M, W, F, Sat, then 40 mg Tues, Th, Sun) 60 tablet 11 02/27/2015  . loratadine (CLARITIN) 10 MG tablet Take 10 mg by mouth daily.   02/27/2015  . Multiple Vitamin (MULTIVITAMIN) tablet Take 1 tablet by mouth daily.     02/27/2015  . PATADAY 0.2 % SOLN  INSTILL 1 DROP IN BOTH EYES DAILY  4 02/27/2015  . tiotropium (SPIRIVA) 18 MCG inhalation capsule Place 1 capsule (18 mcg total) into inhaler and inhale daily. 30 capsule 6 02/27/2015  . traZODone (DESYREL) 100 MG tablet TAKE 2 TABLETS BY MOUTH AT BEDTIME 60 tablet 0 02/26/2015  . warfarin (COUMADIN) 2.5 MG tablet Take as directed by [facility] (Patient taking differently: Take 2.5 mg by mouth daily at 6 PM. 2.5 mg on Tues and Thurs.  1.25 mg all other days.) 30 tablet 3 02/27/2015    HOSPITAL MEDICATIONS: I have reviewed the patient's current medications. Prior to Admission:  Prescriptions prior to admission  Medication Sig Dispense Refill Last Dose  . albuterol (PROAIR HFA) 108 (90 BASE) MCG/ACT inhaler Inhale 2 puffs into the lungs every 4 (four) hours as needed for wheezing or shortness of breath. 1 Inhaler 11 Past Month at Unknown time  . Ascorbic Acid (VITAMIN C) 1000 MG tablet Take 1,000 mg by mouth daily.     02/27/2015  . beclomethasone (QVAR) 80 MCG/ACT inhaler Inhale 2 puffs into the lungs 2 (two) times daily. 1 Inhaler 6 02/27/2015  . BEPREVE 1.5 % SOLN Place 1 drop into both eyes at bedtime.    02/26/2015  . budesonide (RHINOCORT AQUA) 32 MCG/ACT nasal spray Place 1 spray into both nostrils daily. 1 Bottle 6 02/27/2015  . Calcium Carbonate (CALCIUM 600) 1500 MG TABS Take 1 tablet by mouth daily.     02/27/2015  . Cholecalciferol (VITAMIN D3) 1000 UNITS CAPS Take 1 capsule by mouth daily.     02/27/2015  . escitalopram (LEXAPRO) 10 MG tablet TAKE 1 TABLET (10 MG TOTAL) BY MOUTH DAILY. 30 tablet 5 02/27/2015  . esomeprazole (NEXIUM) 40 MG capsule TAKE ONE CAPSULE BY MOUTH EVERY DAY BEFORE BREAKFAST (Patient taking differently: Take 40 mg by mouth at bedtime. ) 30 capsule 5 02/26/2015  . fluticasone (FLONASE) 50 MCG/ACT nasal spray Place 2 sprays into both nostrils 2 (two) times daily. 16 g 11 02/27/2015  . furosemide (LASIX) 20 MG tablet Take as directed. (Patient taking differently: Take 20 mg by mouth  daily after supper. 20 mg M, W, F, Sat, then 40 mg Tues, Th, Sun) 60 tablet 11 02/27/2015  . loratadine (CLARITIN) 10 MG tablet Take 10 mg by mouth daily.   02/27/2015  . Multiple Vitamin (MULTIVITAMIN) tablet Take 1 tablet by mouth daily.     02/27/2015  . PATADAY 0.2 % SOLN INSTILL 1 DROP IN BOTH EYES DAILY  4 02/27/2015  . tiotropium (SPIRIVA) 18 MCG inhalation capsule Place 1 capsule (18 mcg total) into inhaler and inhale daily. 30 capsule 6 02/27/2015  . traZODone (DESYREL) 100 MG tablet  TAKE 2 TABLETS BY MOUTH AT BEDTIME 60 tablet 0 02/26/2015  . warfarin (COUMADIN) 2.5 MG tablet Take as directed by [facility] (Patient taking differently: Take 2.5 mg by mouth daily at 6 PM. 2.5 mg on Tues and Thurs.  1.25 mg all other days.) 30 tablet 3 02/27/2015   Scheduled: . antiseptic oral rinse  7 mL Mouth Rinse q12n4p  . atorvastatin  10 mg Oral q1800  . budesonide (PULMICORT) nebulizer solution  0.25 mg Nebulization BID  . chlorhexidine  15 mL Mouth Rinse BID  . doxycycline  100 mg Oral Q12H  . escitalopram  10 mg Oral Daily  . feeding supplement (PRO-STAT SUGAR FREE 64)  30 mL Oral BID  . fluticasone  2 spray Each Nare Daily  . furosemide  40 mg Intravenous BID  . lisinopril  2.5 mg Oral Daily  . loratadine  10 mg Oral Daily  . pantoprazole  80 mg Oral Q1200  . tiotropium  18 mcg Inhalation Daily  . traZODone  200 mg Oral QHS  . warfarin  1.25 mg Oral Once per day on Mon Wed Fri Sat  . warfarin  2.5 mg Oral Once per day on Sun Tue Thu  . Warfarin - Pharmacist Dosing Inpatient   Does not apply q1800    VITALS: Blood pressure 108/64, pulse 80, temperature 98 F (36.7 C), temperature source Oral, resp. rate 18, height  (1.499 m), weight 125 lb 10 oz (56.983 kg), SpO2 93 %.  PHYSICAL EXAM:  General: Alert, oriented x3, no distress Head: no evidence of trauma, PERRL, EOMI, no exophtalmos or lid lag, no myxedema, no xanthelasma; normal ears, nose and oropharynx Neck: 4-5 cm elevation in  jugular venous pulsations and no hepatojugular reflux; brisk carotid pulses without delay and no carotid bruits Chest: clear to auscultation, no signs of consolidation by percussion or palpation, normal fremitus, symmetrical and full respiratory excursions Cardiovascular: normal position and quality of the apical impulse, regular rhythm, normal first heart sound and loud second heart sound, no rubs or gallops, no murmur Abdomen: no tenderness or distention, no masses by palpation, no abnormal pulsatility or arterial bruits, normal bowel sounds, no hepatosplenomegaly Extremities: no clubbing, cyanosis;  1+ L calf edema; 2+ radial, ulnar and brachial pulses bilaterally; 2+ right femoral, posterior tibial and dorsalis pedis pulses; 2+ left femoral, posterior tibial and dorsalis pedis pulses; no subclavian or femoral bruits Neurological: grossly nonfocal   LABS  CBC  Recent Labs  02/28/15 1354 03/01/15 0604  WBC 8.4 8.6  HGB 13.8 14.4  HCT 41.5 43.0  MCV 86.8 86.7  PLT 332 293   Basic Metabolic Panel  Recent Labs  03/01/15 0604 03/03/15 0341  NA 132* 131*  K 3.4* 4.3  CL 96* 94*  CO2 25 27  GLUCOSE 87 120*  BUN 8 15  CREATININE 0.78 0.71  CALCIUM 8.6* 8.6*   Liver Function Tests  Recent Labs  02/28/15 1354  AST 80*  ALT 68*  ALKPHOS 120  BILITOT 0.9  PROT 7.1  ALBUMIN 3.4*   No results for input(s): LIPASE, AMYLASE in the last 72 hours. Cardiac Enzymes  Recent Labs  03/01/15 0108 03/01/15 0604 03/01/15 1350  TROPONINI 0.10* 0.10* 0.09*    IMAGING: No results found.  ECHO (Dec 25, 2014): - Left ventricle: The cavity size was normal. Wall thickness was normal. Systolic function was normal. The estimated ejection fraction was in the range of 55% to 60%. Wall motion was normal; there were no  regional wall motion abnormalities. - Left atrium: The atrium was mildly dilated. - Right atrium: The atrium was mildly dilated. - Pulmonary arteries: Systolic  pressure was moderately increased. PA peak pressure: 66 mm Hg (S).  LV E/e&', average            19.35   ECG: atrial fibrillation with paced and pseudofused beats  TELEMETRY: atrial fibrillation with paced and pseudofused beats  Cath 2012 ASSESSMENT: 1. Probable Takotsubo cardiomyopathy. 2. Moderate double vessel coronary artery disease in the left anterior  descending and right coronary arteries. 3. Normal left circumflex. 4. Markedly abnormal left ventricular function, consistent with transient  apical ballooning.  IMPRESSION: 1. Moderate to severe pulmonary HTN and chronic respiratory failure with hypoxemia in an elderly patient with preserved LVEF and moderate chronic airway disease 2. Acute on chronic diastolic heart failure supported by increased E/e' ratio and (mildly) elevated BNP with good diuretic response. Unclear whether she is now euvolemic. 3. CAD in a patient without angina and with moderate LAD and RCA disease by cath 2012 4. Permanent atrial fibrillation on chronic anticoagulation make chronic thromboembolic pulmonary disease unlikely, also limits accuracy of diastolic LV function assessment. 5. Permanent pacemaker with very high prevalence of V pacing, but with frequent pseudofusion on monitor. I wonder whether we could reduce V pacing by adding hysteresis feature. At least theoretically, V pacing could worsen diastolic function and increase odds of CHF. Discuss with Dr. Graciela Husbands. 6. Abnormal RA factor and minimally elevated ANA without overt autoimmune disease, could be a marker of collagen-vascular disease related PAH  RECOMMENDATION: 1. Enough confusion exists regarding the mechanism of her PAH (diastolic left heart failure versus primary lung disorder) that hemodynamic assessment is indicated. In addition, right heart cath would clarify whether enough diuresis has been provided. Would also recommend left heart cath and coronary angiography in  view of the known moderate CAD >4 years ago and intervening change in symptoms. INR is 2.4, will defer until INR<2.0. This procedure has been fully reviewed with the patient and written informed consent has been obtained.   Time Spent Directly with Patient: 60 minutes  Thurmon Fair, MD, [facility] [phone] office [phone] pager   03/03/2015, 10:42 AM

## 2015-03-03 NOTE — Evaluation (Signed)
Physical Therapy Evaluation Patient Details Name: [name] MRN: [id number] DOB: November 05, 1934 Today's Date: 03/03/2015   History of Present Illness  80yo woman with PMH of OA, Arib, pulmonary HTN, GERD, CHF, depression who presents due to increased fatigue and SOB. She had a TTE last year which showed a PA pressure of 66 making pulmonary HTN likely. She is on home O2 for chronic respiratory failure. Apparently, over the past few days she has not wanted to take her lasix b/c she did not want to have to go to the bathroom so much. She presented to her PCP today and was noted to have pOx of 89% on her home O2 of 4L. She was also noted to only be able to walk about 10 feet when she needed to stop and rest due to her SOB.  Clinical Impression  Pt admitted with above diagnosis. Pt currently with functional limitations due to the deficits listed below (see PT Problem List). Pt with poor endurance with sats in high 70's with transfer only requiring to incr O2 to 6LO2 to recover.  Asssited pt back to bed due to pt did not tolerate sitting more than 8 min.  Will continue to follow acutely and pt may need NHP with therapy.  Pt will benefit from skilled PT to increase their independence and safety with mobility to allow discharge to the venue listed below.      Follow Up Recommendations SNF;Supervision/Assistance - 24 hour (Possibly SNF for therapy)    Equipment Recommendations  Rolling walker with 5" wheels;3in1 (PT)    Recommendations for Other Services       Precautions / Restrictions Precautions Precautions: Fall Restrictions Weight Bearing Restrictions: No      Mobility  Bed Mobility Overal bed mobility: Needs Assistance Bed Mobility: Supine to Sit     Supine to sit: Min assist     General bed mobility comments: Asssit to get to EOB.  Incr time due to poor endurance.Desat to 84% on 6LO2 just gettting to EOB.   Transfers Overall transfer level: Needs assistance Equipment used: 2  person hand held assist Transfers: Sit to/from [employer] Sit to Stand: Mod assist Stand pivot transfers: Mod assist       General transfer comment: Took pivotal steps around to recliner.  Desat to 78% on 4LO2 therefore incr O2 to 6L. Sat in chair 8 min and sats back to 90%.  Transferred back to bed with sats dropping to 80% and took 4 min to return to 90%.  Decr O2 back to 4LO2 with sats staying >90%.   Ambulation/Gait                Stairs            Wheelchair Mobility    Modified Rankin (Stroke Patients Only)       Balance Overall balance assessment: Needs assistance;History of Falls Sitting-balance support: Bilateral upper extremity supported;Feet supported Sitting balance-Leahy Scale: Poor Sitting balance - Comments: needs UE support for balance   Standing balance support: Bilateral upper extremity supported;During functional activity Standing balance-Leahy Scale: Poor Standing balance comment: requires Bil Ue support for balance.                              Pertinent Vitals/Pain Pain Assessment: No/denies pain  See above for sats with activity.    Home Living Family/patient expects to be discharged to:: Private residence Living Arrangements: Spouse/significant other Available  Help at Discharge: Family;Available 24 hours/day Type of Home: House Home Access: Stairs to enter Entrance Stairs-Rails: Right Entrance Stairs-Number of Steps: 4 Home Layout: Two level;Bed/bath upstairs;1/2 bath on main level Home Equipment: Wheelchair - manual      Prior Function Level of Independence: Needs assistance   Gait / Transfers Assistance Needed: pt held onto husband to walk PTA  ADL's / Homemaking Assistance Needed: Pt sponge bathes, husband assists her with dressing  Comments: Pt states she sleeps on couch and uses 1/2 bath on lower level     Hand Dominance        Extremity/Trunk Assessment   Upper Extremity Assessment:  Defer to OT evaluation           Lower Extremity Assessment: Generalized weakness      Cervical / Trunk Assessment: Kyphotic  Communication   Communication: No difficulties  Cognition Arousal/Alertness: Awake/alert Behavior During Therapy: Anxious Overall Cognitive Status: Within Functional Limits for tasks assessed                      General Comments      Exercises        Assessment/Plan    PT Assessment Patient needs continued PT services  PT Diagnosis Generalized weakness   PT Problem List Decreased activity tolerance;Decreased balance;Decreased mobility;Decreased knowledge of use of DME;Decreased safety awareness;Decreased knowledge of precautions;Decreased strength  PT Treatment Interventions DME instruction;Gait training;Functional mobility training;Therapeutic activities;Therapeutic exercise;Balance training;Patient/family education   PT Goals (Current goals can be found in the Care Plan section) Acute Rehab PT Goals Patient Stated Goal: to go home PT Goal Formulation: With patient Time For Goal Achievement: 03/17/15 Potential to Achieve Goals: Good    Frequency Min 3X/week   Barriers to discharge        Co-evaluation               End of Session Equipment Utilized During Treatment: Gait belt;Oxygen Activity Tolerance: Patient limited by fatigue Patient left: with call bell/phone within reach;in bed;with bed alarm set;with family/visitor present Nurse Communication: Mobility status         Time: 1308-6578 PT Time Calculation (min) (ACUTE ONLY): 33 min   Charges:   PT Evaluation $Initial PT Evaluation Tier I: 1 Procedure PT Treatments $Therapeutic Activity: 8-22 mins   PT G CodesBerline Lopes Mar 10, 2015, 5:11 PM Ethyl [facility] Acute Rehabilitation [phone] (pager)

## 2015-03-03 NOTE — Progress Notes (Signed)
   03/03/15 1100  Clinical Encounter Type  Visited With Patient and family together;Health care provider  Visit Type Initial   Chaplain was referred to patient via spiritual care consult. Consult indicated patient would like to create or update an advanced directive. Chaplain was being visited by her husband when chaplain arrived. Patient's husband thinks that her and the patient have already completed advanced directives. Patient's husband intends to check when he gets home and see if he can get a copy to the hospital. If the patient does not already have one then she may be interested, although she expressed interest only in a healthcare power of attorney form. Please call spiritual care office if the patient needs further assistance with Advanced Directives.  Tamarius Rosenfield, Tommi Emery, Chaplain  11:10 AM

## 2015-03-03 NOTE — Progress Notes (Signed)
ANTICOAGULATION CONSULT NOTE - Follow Up Consult  Pharmacy Consult for Warfarin Indication: atrial fibrillation  Allergies  Allergen Reactions  . Benzoyl Peroxide Swelling    Swelling at the site of use  . Neomycin-Bacitracin Zn-Polymyx Swelling    Swelling at the site of use    Patient Measurements: Height: 4\' 11"  (149.9 cm) Weight: 125 lb 10 oz (56.983 kg) (Scale A) IBW/kg (Calculated) : 43.2   Vital Signs: Temp: 98 F (36.7 C) (07/11 0655) Temp Source: Oral (07/11 0655) BP: 108/64 mmHg (07/11 0655) Pulse Rate: 80 (07/11 0655)  Labs:  Recent Labs  02/28/15 1354  03/01/15 0108 03/01/15 0115 03/01/15 0604 03/01/15 1350 03/02/15 0308 03/03/15 0341  HGB 13.8  --   --   --  14.4  --   --   --   HCT 41.5  --   --   --  43.0  --   --   --   PLT 332  --   --   --  293  --   --   --   LABPROT  --   --   --  27.1*  --   --  24.0* 25.9*  INR  --   --   --  2.56*  --   --  2.17* 2.40*  CREATININE 0.65  --   --   --  0.78  --   --  0.71  TROPONINI 0.08*  < > 0.10*  --  0.10* 0.09*  --   --   < > = values in this interval not displayed.  Estimated Creatinine Clearance: 45.3 mL/min (by C-G formula based on Cr of 0.71).   Medications:  Scheduled:  . antiseptic oral rinse  7 mL Mouth Rinse q12n4p  . atorvastatin  10 mg Oral q1800  . budesonide (PULMICORT) nebulizer solution  0.25 mg Nebulization BID  . chlorhexidine  15 mL Mouth Rinse BID  . doxycycline  100 mg Oral Q12H  . escitalopram  10 mg Oral Daily  . feeding supplement (PRO-STAT SUGAR FREE 64)  30 mL Oral BID  . fluticasone  2 spray Each Nare Daily  . furosemide  40 mg Intravenous BID  . lisinopril  2.5 mg Oral Daily  . loratadine  10 mg Oral Daily  . pantoprazole  80 mg Oral Q1200  . tiotropium  18 mcg Inhalation Daily  . traZODone  200 mg Oral QHS  . warfarin  1.25 mg Oral Once per day on Mon Wed Fri Sat  . warfarin  2.5 mg Oral Once per day on Sun Tue Thu  . Warfarin - Pharmacist Dosing Inpatient   Does  not apply q1800   Infusions:   PRN: albuterol  Assessment: 50 YOF admitted on 02/28/15 with acute on chronic respiratory failure/cellulitis, takes warfarin PTA (Warfarin 2.5 mg on Tues/Thurs/Sun, 1.25 mg all other days from anticoag outpatient notes).  Spoke with pt about home dose and is taking 2.5 mg T/Th, 1.25 mg AOD. -INR today is 2.40 and is at goal. INR stable and at goal since admission.   Goal of Therapy:  INR 2-3 Monitor platelets by anticoagulation protocol: Yes   Plan:  -Continue prescribed warfarin home dose (2.5 mg on Tues/Thurs/Sun, 1.25 mg all other days) since INR therapeutic -Daily INR -Monitor for s/sx of bleeding  Synetta Fail, PharmD Pharmacy Resident 03/03/2015,10:55 AM

## 2015-03-03 NOTE — Progress Notes (Signed)
UR completed 

## 2015-03-03 NOTE — Progress Notes (Signed)
[facility] PROGRESS NOTE Interim History: 80yo woman with PMH of OA, Arib, pulmonary HTN, GERD, CHF, depression who presents due to increased fatigue and SOB. She had a TTE last year which showed a PA pressure of 66 making pulmonary HTN likely. She is on home O2 for chronic respiratory failure. Apparently, over the past few days she has not wanted to take her lasix b/c she did not want to have to go to the bathroom so much. She presented to her PCP today and was noted to have pOx of 89% on her home O2 of 4L. She was also noted to only be able to walk about 10 feet when she needed to stop and rest due to her SOB.  Filed Weights   03/01/15 1514 03/02/15 0500 03/03/15 0655  Weight: 58 kg (127 lb 13.9 oz) 57.3 kg (126 lb 5.2 oz) 56.983 kg (125 lb 10 oz)        Intake/Output Summary (Last 24 hours) at 03/03/15 1214 Last data filed at 03/03/15 0854  Gross per 24 hour  Intake    480 ml  Output   1600 ml  Net  -1120 ml     Assessment/Plan: Acute on chronic respiratory failure  multifactorial due to  Acute diastolic heart failure and pulmonary lung disease: - Change lasix to oral. - monitor lytes. Bp tolerating diuresis. Cr stable. - not on Beta blockers. Tolerate it ACE-I - On nasal canula. - consulted pulmonary recommended L& R cath, cardiology consulted. - appriciate cards and pulmonary assistance.  Cellulitis of left lower extremity - Started on IV vancomycin on 7.8.2016, afebrile, erythema receding. - change antibiotic to doxy  Pulmonary hypertension: - consult pulmonary who recommended cardiology. - for Right and left cath  Atrial fibrillation-persistent - Rate controlled. INR therapeutic.    Code Status: full Family Communication: husband  Disposition Plan: home in 2-3 days   Consultants:  none  Procedures: ECHO: none  Antibiotics:  vanc-> doxy  HPI/Subjective: Relates SOB is better.  Objective: Filed Vitals:   03/02/15 2039 03/02/15 2156  03/03/15 0655 03/03/15 0814  BP:  111/64 108/64   Pulse:  80 80   Temp:  98.1 F (36.7 C) 98 F (36.7 C)   TempSrc:  Oral Oral   Resp:  20 18   Height:      Weight:   56.983 kg (125 lb 10 oz)   SpO2: 91% 91% 96% 93%     Exam:  General: Alert, awake, oriented x3, in no acute distress.  HEENT: No bruits, no goiter. - JVD Heart: Regular rate and rhythm. Lungs: Good air movement, crackles bilaterally Abdomen: Soft, nontender, nondistended, positive bowel sounds.  Neuro: Grossly intact, nonfocal.   Data Reviewed: Basic Metabolic Panel:  Recent Labs Lab 02/28/15 1354 03/01/15 0604 03/03/15 0341  NA 131* 132* 131*  K 4.3 3.4* 4.3  CL 100* 96* 94*  CO2 22 25 27   GLUCOSE 107* 87 120*  BUN 10 8 15   CREATININE 0.65 0.78 0.71  CALCIUM 8.9 8.6* 8.6*   Liver Function Tests:  Recent Labs Lab 02/28/15 1354  AST 80*  ALT 68*  ALKPHOS 120  BILITOT 0.9  PROT 7.1  ALBUMIN 3.4*   No results for input(s): LIPASE, AMYLASE in the last 168 hours. No results for input(s): AMMONIA in the last 168 hours. CBC:  Recent Labs Lab 02/28/15 1354 03/01/15 0604  WBC 8.4 8.6  HGB 13.8 14.4  HCT 41.5 43.0  MCV 86.8 86.7  PLT 332  293   Cardiac Enzymes:  Recent Labs Lab 02/28/15 1354 02/28/15 2005 03/01/15 0108 03/01/15 0604 03/01/15 1350  TROPONINI 0.08* 0.12* 0.10* 0.10* 0.09*   BNP (last 3 results)  Recent Labs  02/28/15 1354 03/01/15 0108  BNP 393.9* 491.9*    ProBNP (last 3 results) No results for input(s): PROBNP in the last 8760 hours.  CBG: No results for input(s): GLUCAP in the last 168 hours.  Recent Results (from the past 240 hour(s))  MRSA PCR Screening     Status: None   Collection Time: 02/28/15 11:22 PM  Result Value Ref Range Status   MRSA by PCR NEGATIVE NEGATIVE Final    Comment:        The GeneXpert MRSA Assay (FDA approved for NASAL specimens only), is one component of a comprehensive MRSA colonization surveillance program. It is  not intended to diagnose MRSA infection nor to guide or monitor treatment for MRSA infections.      Studies: No results found.  Scheduled Meds: . antiseptic oral rinse  7 mL Mouth Rinse q12n4p  . atorvastatin  10 mg Oral q1800  . budesonide (PULMICORT) nebulizer solution  0.25 mg Nebulization BID  . chlorhexidine  15 mL Mouth Rinse BID  . doxycycline  100 mg Oral Q12H  . escitalopram  10 mg Oral Daily  . feeding supplement (PRO-STAT SUGAR FREE 64)  30 mL Oral BID  . fluticasone  2 spray Each Nare Daily  . furosemide  40 mg Intravenous BID  . lisinopril  2.5 mg Oral Daily  . loratadine  10 mg Oral Daily  . pantoprazole  80 mg Oral Q1200  . tiotropium  18 mcg Inhalation Daily  . traZODone  200 mg Oral QHS  . warfarin  1.25 mg Oral Once per day on Mon Wed Fri Sat  . warfarin  2.5 mg Oral Once per day on Sun Tue Thu  . Warfarin - Pharmacist Dosing Inpatient   Does not apply q1800   Continuous Infusions:    Marinda Elk  [facility] Pager [phone] If 7PM-7AM, please contact night-coverage at [email or web address], password [facility] 03/03/2015, 12:14 PM  LOS: 3 days

## 2015-03-03 NOTE — Care Management (Signed)
Important Message  Patient Details  Name: [name] MRN: [id number] Date of Birth: 11-Jan-1935   Medicare Important Message Given:  Yes-second notification given    Kyla Balzarine 03/03/2015, 2:29 PM

## 2015-03-03 NOTE — Progress Notes (Signed)
Name: [name] MRN: [id number] DOB: [birth year]    ADMISSION DATE:  02/28/2015 CONSULTATION DATE:  03/03/2015  REFERRING MD :  David Stall  CHIEF COMPLAINT:  SOB  BRIEF PATIENT DESCRIPTION: 80 y.o. woman followed by Dr Delton Coombes long term for mild obstructive lung disease, bronchiectasis and cough. 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 She is followed at [facility] cardiology for diastolic CHF and pacemaker placement for ablated A Flutter.   Over the last 12 months she has developed progressive exertional dyspnea and hypoxemia, ultimately requiring initiation of home O2. Her evaluation to date has been significant for PAH, TTE 12/25/14 showed LEVEF 55 - 60%, mild bilateral atrial enlargement, PASP 66 mmHg (increased from 38 mmHg 2 years prior). CT chest has not shown ILD (11/13/13), V/q without evidence chronic VTE (12/2014). Her RF and ANA are both elevated.   She was admitted with progressive dyspnea, hypoxemia, some mild increase in B interstitial infiltrates after she stopped taking her home lasix for a few days.   SIGNIFICANT EVENTS  7/8 - admitted. 7/10 - PCCM consulted.  STUDIES:  CXR 7/8 >>> reactive airway dz with superimposed CHF and mild edema.  SUBJECTIVE:    VITAL SIGNS: Temp:  [97.7 F (36.5 C)-98.1 F (36.7 C)] 98 F (36.7 C) (07/11 0655) Pulse Rate:  [70-80] 80 (07/11 0655) Resp:  [18-22] 18 (07/11 0655) BP: (108-111)/(62-64) 108/64 mmHg (07/11 0655) SpO2:  [91 %-98 %] 93 % (07/11 0814) Weight:  [56.983 kg (125 lb 10 oz)] 56.983 kg (125 lb 10 oz) (07/11 0655)  PHYSICAL EXAMINATION: General: Chronically ill appearing female, resting in bed, in NAD. Neuro: A&O x 3, non-focal.  HEENT: [place]/AT. PERRL, sclerae anicteric. 4L O2 via [place] in place. Cardiovascular: IRIR, no M/R/G.  Lungs: Respirations even and unlabored.  Faint basilar crackles. Abdomen: BS x 4, soft, NT/ND.  Musculoskeletal: No gross deformities, no edema.   Skin: Intact, warm, no rashes.    Recent Labs Lab 02/28/15 1354 03/01/15 0604 03/03/15 0341  NA 131* 132* 131*  K 4.3 3.4* 4.3  CL 100* 96* 94*  CO2 22 25 27   BUN 10 8 15   CREATININE 0.65 0.78 0.71  GLUCOSE 107* 87 120*    Recent Labs Lab 02/28/15 1354 03/01/15 0604  HGB 13.8 14.4  HCT 41.5 43.0  WBC 8.4 8.6  PLT 332 293   No results found.  ASSESSMENT / PLAN:  Acute on chronic hypoxic respiratory failure - progressive over 12 months PAH (PASP 66 by echo 12/25/14) - severe  -- I suspect that this is primarily Group 1 PAH, a manifestation of newly revealed connective tissue disease,  + component of Group 2 disease (from diastolic dysfxn and pacer dependence). Consider also any contribution of underlying lung disease (Group 3) but no significant ILD by CT or CXR to date. Her cardiac disease and her asthma have both been stable for years and have not appeared to change as her PAH has evolved.  Consider ? ILD in setting suspected connective tissue disease.  Hx bronchiectasis and asthma Recs:  Continue supplemental O2 as needed to maintain SpO2 > 90%. Continue her home spiriva, change pulmicort to her home QVAR No role for steroids at this time Have discussed case with cardiology this am, believe that she needs R (and possibly L) heart cath. Suspect she will need to start targeted PAH meds on this admission. Will discuss with her based on cath results.  Will recheck ANA and RF  today Consider High Res CT chest when we believe she has been adequately diuresed.   Diastolic CHF. Atrial fib / flutter, ablated and paced. Recs:  Agree with diuresis; though caution with overshooting given severe PAH. Continue warfarin.   Levy Pupa, MD, PhD 03/03/2015, 10:22 AM [facility] [phone] or if no answer [phone]

## 2015-03-04 DIAGNOSIS — E43 Unspecified severe protein-calorie malnutrition: Secondary | ICD-10-CM

## 2015-03-04 LAB — PROTIME-INR
INR: 2.79 — ABNORMAL HIGH (ref 0.00–1.49)
INR: 2.09 — ABNORMAL HIGH (ref 0.00–1.49)
PROTHROMBIN TIME: 29 s — AB (ref 11.6–15.2)
PROTHROMBIN TIME: 23.3 s — AB (ref 11.6–15.2)

## 2015-03-04 LAB — BASIC METABOLIC PANEL
ANION GAP: 8 (ref 5–15)
BUN: 23 mg/dL — ABNORMAL HIGH (ref 6–20)
CO2: 28 mmol/L (ref 22–32)
CREATININE: 0.71 mg/dL (ref 0.44–1.00)
Calcium: 8.6 mg/dL — ABNORMAL LOW (ref 8.9–10.3)
Chloride: 94 mmol/L — ABNORMAL LOW (ref 101–111)
Glucose, Bld: 102 mg/dL — ABNORMAL HIGH (ref 65–99)
Potassium: 3.7 mmol/L (ref 3.5–5.1)
SODIUM: 130 mmol/L — AB (ref 135–145)

## 2015-03-04 MED ORDER — SODIUM CHLORIDE 0.9 % IV SOLN
INTRAVENOUS | Status: DC
Start: 2015-03-05 — End: 2015-03-08
  Administered 2015-03-05: 06:00:00 via INTRAVENOUS

## 2015-03-04 MED ORDER — POTASSIUM CHLORIDE CRYS ER 20 MEQ PO TBCR
40.0000 meq | EXTENDED_RELEASE_TABLET | Freq: Every day | ORAL | Status: DC
Start: 2015-03-04 — End: 2015-03-05
  Administered 2015-03-04: 40 meq via ORAL
  Filled 2015-03-04 (×3): qty 2

## 2015-03-04 MED ORDER — SODIUM CHLORIDE 0.9 % IV SOLN: 250.0000 mL | INTRAVENOUS | Status: DC | PRN

## 2015-03-04 MED ORDER — SODIUM CHLORIDE 0.9 % IJ SOLN
3.0000 mL | Freq: Two times a day (BID) | INTRAMUSCULAR | Status: DC
Start: 2015-03-04 — End: 2015-03-08
  Administered 2015-03-04 – 2015-03-08 (×6): 3 mL via INTRAVENOUS

## 2015-03-04 MED ORDER — DOXYCYCLINE HYCLATE 100 MG PO TABS
100.0000 mg | ORAL_TABLET | Freq: Two times a day (BID) | ORAL | Status: DC
  Administered 2015-03-04 – 2015-03-08 (×8): 100 mg via ORAL
  Filled 2015-03-04 (×11): qty 1

## 2015-03-04 MED ORDER — VITAMIN K1 10 MG/ML IJ SOLN
1.0000 mg | Freq: Once | INTRAVENOUS | Status: AC
  Administered 2015-03-04: 1 mg via INTRAVENOUS
  Filled 2015-03-04: qty 0.1

## 2015-03-04 MED ORDER — SODIUM CHLORIDE 0.9 % IJ SOLN: 3.0000 mL | INTRAMUSCULAR | Status: DC | PRN

## 2015-03-04 MED ORDER — FUROSEMIDE 10 MG/ML IJ SOLN
40.0000 mg | Freq: Two times a day (BID) | INTRAMUSCULAR | Status: DC
  Administered 2015-03-04: 40 mg via INTRAVENOUS
  Filled 2015-03-04 (×2): qty 4

## 2015-03-04 MED ORDER — ASPIRIN 81 MG PO CHEW
81.0000 mg | CHEWABLE_TABLET | ORAL | Status: AC
  Administered 2015-03-05: 81 mg via ORAL
  Filled 2015-03-04: qty 1

## 2015-03-04 NOTE — Progress Notes (Signed)
Patient ID: [name], female   DOB: [birth year], 80 y.o.   MRN: [id number]   80 yo with history of permanent atrial fibrillation, sinus node dysfunction s/p MDT PPM, Takotsubo CMP (resolved, 9/07), idiopathic eosinophilia, bronchiectasis with mild obstructive PFTs, CAD with moderate LAD and RCA disease by cath in 2012, diastolic CHF and suspect PAH was admitted with edema, weight gain, progressive dyspnea.  She has been slowly worsening over the last few months.  She has been on 4 L home oxygen.   This admission, she was diuresed with IV Lasix then switched over to po Lasix today.  Last echo in 5/16 showed EF 55-60%, PA systolic pressure 66 mmHg, RV apparently normal (will need to review).  She was seen by Dr Royann Shivers who recommended right and left heart cath (workup pulmonary hypertension and also with history of CAD). Today, she remains short of breath walking to the bedside commode.   Scheduled Meds: . antiseptic oral rinse  7 mL Mouth Rinse q12n4p  . atorvastatin  10 mg Oral q1800  . budesonide (PULMICORT) nebulizer solution  0.25 mg Nebulization BID  . chlorhexidine  15 mL Mouth Rinse BID  . doxycycline  100 mg Oral BID  . escitalopram  10 mg Oral Daily  . feeding supplement (PRO-STAT SUGAR FREE 64)  30 mL Oral BID  . fluticasone  2 spray Each Nare Daily  . furosemide  40 mg Intravenous BID  . lisinopril  2.5 mg Oral Daily  . loratadine  10 mg Oral Daily  . pantoprazole  80 mg Oral Q1200  . tiotropium  18 mcg Inhalation Daily  . traZODone  200 mg Oral QHS   Continuous Infusions:  PRN Meds:.albuterol   Filed Vitals:   03/03/15 2024 03/04/15 0635 03/04/15 0850 03/04/15 0900  BP: 111/81 123/79  101/73  Pulse: 79 87 87 84  Temp: 98 F (36.7 C) 98.6 F (37 C)  98 F (36.7 C)  TempSrc: Oral Oral  Oral  Resp: Height:      Weight:  121 lb 14.6 oz (55.3 kg)    SpO2: 93% 95% 93% 95%    Intake/Output Summary (Last 24 hours) at 03/04/15 1124 Last data filed at  03/04/15 0939  Gross per 24 hour  Intake    240 ml  Output   1000 ml  Net   -760 ml    LABS: Basic Metabolic Panel:  Recent Labs  40/98/11 0341 03/04/15 0305  NA 131* 130*  K 4.3 3.7  CL 94* 94*  CO2 27 28  GLUCOSE 120* 102*  BUN 15 23*  CREATININE 0.71 0.71  CALCIUM 8.6* 8.6*   Liver Function Tests: No results for input(s): AST, ALT, ALKPHOS, BILITOT, PROT, ALBUMIN in the last 72 hours. No results for input(s): LIPASE, AMYLASE in the last 72 hours. CBC: No results for input(s): WBC, NEUTROABS, HGB, HCT, MCV, PLT in the last 72 hours. Cardiac Enzymes:  Recent Labs  03/01/15 1350  TROPONINI 0.09*   BNP: Invalid input(s): POCBNP D-Dimer: No results for input(s): DDIMER in the last 72 hours. Hemoglobin A1C: No results for input(s): HGBA1C in the last 72 hours. Fasting Lipid Panel: No results for input(s): CHOL, HDL, LDLCALC, TRIG, CHOLHDL, LDLDIRECT in the last 72 hours. Thyroid Function Tests: No results for input(s): TSH, T4TOTAL, T3FREE, THYROIDAB in the last 72 hours.  Invalid input(s): FREET3 Anemia Panel: No results for input(s): VITAMINB12, FOLATE, FERRITIN, TIBC, IRON, RETICCTPCT in the last 72 hours.  RADIOLOGY: Dg Chest 2 View  02/28/2015   CLINICAL DATA:  Shortness of breath and lower extremity pitting edema ; history of atrial fibrillation and asthma  EXAM: CHEST  2 VIEW  COMPARISON:  PA and lateral chest of Jan 17, 2015  FINDINGS: The lungs are mildly hyperinflated. The interstitial markings are increased. The pulmonary vascularity is engorged. The cardiac silhouette is mildly enlarged. A permanent pacemaker is in place with appropriate positioning of the electrodes. There is no pleural effusion. The bony thorax exhibits no acute abnormality.  IMPRESSION: Reactive airway disease with superimposed CHF and mild interstitial edema. There is no alveolar pneumonia.   Electronically Signed   By: David  [place] M.D.   On: 02/28/2015 14:17   Dg Wrist Complete  Right  02/28/2015   CLINICAL DATA:  Wrist pain.  No known injury.  Initial evaluation.  EXAM: RIGHT WRIST - COMPLETE 3+ VIEW  COMPARISON:  None.  FINDINGS: Diffuse osteopenia and degenerative change. No acute bony or joint abnormality identified. No evidence of fracture or dislocation.  IMPRESSION: Negative.   Electronically Signed   By: Maisie Fus  Register   On: 02/28/2015 14:18   US Venous Img Lower Unilateral Left  02/28/2015   CLINICAL DATA:  Left lower extremity swelling for 2 months.  EXAM: LEFT LOWER EXTREMITY VENOUS DOPPLER ULTRASOUND  TECHNIQUE: Gray-scale sonography with graded compression, as well as color Doppler and duplex ultrasound were performed to evaluate the lower extremity deep venous systems from the level of the common femoral vein and including the common femoral, femoral, profunda femoral, popliteal and calf veins including the posterior tibial, peroneal and gastrocnemius veins when visible. The superficial great saphenous vein was also interrogated. Spectral Doppler was utilized to evaluate flow at rest and with distal augmentation maneuvers in the common femoral, femoral and popliteal veins.  COMPARISON:  None.  FINDINGS: Contralateral Common Femoral Vein: Respiratory phasicity is normal and symmetric with the symptomatic side. No evidence of thrombus. Normal compressibility.  Common Femoral Vein: No evidence of thrombus. Normal compressibility, respiratory phasicity and response to augmentation.  Saphenofemoral Junction: No evidence of thrombus. Normal compressibility and flow on color Doppler imaging.  Profunda Femoral Vein: No evidence of thrombus. Normal compressibility and flow on color Doppler imaging.  Femoral Vein: No evidence of thrombus. Normal compressibility, respiratory phasicity and response to augmentation.  Popliteal Vein: No evidence of thrombus. Normal compressibility, respiratory phasicity and response to augmentation.  Calf Veins: No evidence of thrombus. Normal  compressibility and flow on color Doppler imaging.  Superficial Great Saphenous Vein: No evidence of thrombus. Normal compressibility and flow on color Doppler imaging.  Venous Reflux:  None.  Other Findings:  None.  IMPRESSION: No evidence of deep venous thrombosis.   Electronically Signed   By: Myles Rosenthal M.D.   On: 02/28/2015 17:11   Mm Digital Screening Bilateral  02/04/2015   CLINICAL DATA:  Screening.  EXAM: DIGITAL SCREENING BILATERAL MAMMOGRAM WITH CAD  COMPARISON:  Previous exam(s).  ACR Breast Density Category c: The breast tissue is heterogeneously dense, which may obscure small masses.  FINDINGS: There are no findings suspicious for malignancy. Images were processed with CAD.  IMPRESSION: No mammographic evidence of malignancy. A result letter of this screening mammogram will be mailed directly to the patient.  RECOMMENDATION: Screening mammogram in one year. (Code:[id number]-B-01Y)  BI-RADS CATEGORY  1: Negative.   Electronically Signed   By: Dalphine Handing M.D.   On: 02/04/2015 18:49    PHYSICAL EXAM General: NAD Neck: JVP  8-9 cm, no thyromegaly or thyroid nodule.  Lungs: Clear to auscultation bilaterally with normal respiratory effort. CV: Nondisplaced PMI.  Heart regular S1/S2, no S3/S4, no murmur.  Trace ankle edema.  No carotid bruit.  Normal pedal pulses.  Abdomen: Soft, nontender, no hepatosplenomegaly, no distention.  Neurologic: Alert and oriented x 3.  Psych: Normal affect. Extremities: No clubbing or cyanosis.   TELEMETRY: Reviewed telemetry pt in atrial fibrillation, controlled rate  ASSESSMENT AND PLAN: 80 yo with history of permanent atrial fibrillation, sinus node dysfunction s/p MDT PPM, Takotsubo CMP (resolved, 9/07), idiopathic eosinophilia, bronchiectasis with mild obstructive PFTs, CAD with moderate LAD and RCA disease by cath in 2012, diastolic CHF and suspect PAH was admitted with edema, weight gain, progressive dyspnea.  She has been slowly worsening over the last few  months.  She has been on 4 L home oxygen.  1. Acute on chronic diastolic CHF: EF 21-22% on 5/16 echo with PASP 66 mmHg.  I am concerned that pulmonary arterial hypertension is playing a large role in her presentation.  She is still very short of breath ambulating and has some residual volume overload on exam.  - Would continue Lasix 40 mg IV bid today with K repletion.  - Will plan RHC/LHC tomorrow.  Discussed risks/benefits with patient and husband today and they agree to proceed.  2. Pulmonary hypertension: I am concerned for PAH, possibly group 1 related to rheumatological disease.  RF was positive and ANA weakly positive in 5/16.  She has a history of bronchiectasis with mild obstructive PFTs.  CT chest in 3/15 did not show ILD but she is much more symptomatic now.  V/Q scan in 5/16 did not show acute or chronic PE.   - Needs rheum workup: ?RA => will send CCP antibody and anti-dsDNA antibody.   - ?ILD related to rheumatological disease, ?RA => will need high resolution non-contrast chest CT when fully diuresed.  - RHC tomorrow to assess PA pressure and filling pressures.  3. CAD: Moderate CAD on LHC in 2012.  Given exertional symptoms and known CAD, agree with Dr Royann Shivers that coronary angiography along with RHC is warranted.  Continue atorvastatin.  Not on ASA as she has been on warfarin.  4. Atrial fibrillation: Permanent it appears at this point.  HR not elevated, not on rate control meds.  Warfarin held for cath.  5. PPM: MDT, h/o SN dysfunction.  6. H/o Takotsubo CMP: resolved.   Marca Ancona 03/04/2015 1:32 PM

## 2015-03-04 NOTE — Progress Notes (Signed)
[facility] PROGRESS NOTE Interim History: 80yo woman with PMH of OA, Arib, pulmonary HTN, GERD, CHF, depression who presents due to increased fatigue and SOB. She had a TTE last year which showed a PA pressure of 66 making pulmonary HTN likely. She is on home O2 for chronic respiratory failure. Apparently, over the past few days she has not wanted to take her lasix b/c she did not want to have to go to the bathroom so much. She presented to her PCP today and was noted to have pOx of 89% on her home O2 of 4L. She was also noted to only be able to walk about 10 feet when she needed to stop and rest due to her SOB.  Filed Weights   03/02/15 0500 03/03/15 0655 03/04/15 0635  Weight: 57.3 kg (126 lb 5.2 oz) 56.983 kg (125 lb 10 oz) 55.3 kg (121 lb 14.6 oz)        Intake/Output Summary (Last 24 hours) at 03/04/15 1118 Last data filed at 03/04/15 4166  Gross per 24 hour  Intake    240 ml  Output   1000 ml  Net   -760 ml     Assessment/Plan: Acute on chronic respiratory failure  multifactorial due to  Acute diastolic heart failure and pulmonary lung disease: - Change lasix to oral. - monitor lytes. Bp tolerating diuresis. Cr stable. - not on Beta blockers. Tolerate it ACE-I - On nasal canula. - consulted pulmonary recommended L& R cath, once INR < 1.5. Give vit K. - Appriciate cards and pulmonary assistance.  Cellulitis of left lower extremity - Started on IV vancomycin on 7.8.2016, afebrile, erythema receding. - change antibiotic to doxy  Pulmonary hypertension: - Consult pulmonary who recommended cardiology. - For Right and left cath  Atrial fibrillation-persistent - Rate controlled. INR therapeutic. - Given Vit K.    Code Status: full Family Communication: husband  Disposition Plan: home in 2-3 days   Consultants:  none  Procedures: ECHO: none  Antibiotics:  vanc-> doxy cont until 7.15.2016  HPI/Subjective: Relates SOB is  better.  Objective: Filed Vitals:   03/03/15 2024 03/04/15 0635 03/04/15 0850 03/04/15 0900  BP: 111/81 123/79  101/73  Pulse: 79 87 87 84  Temp: 98 F (36.7 C) 98.6 F (37 C)  98 F (36.7 C)  TempSrc: Oral Oral  Oral  Resp: 18 22 20 22   Height:      Weight:  55.3 kg (121 lb 14.6 oz)    SpO2: 93% 95% 93% 95%     Exam:  General: Alert, awake, oriented x3, in no acute distress.  HEENT: No bruits, no goiter. - JVD Heart: Regular rate and rhythm. Lungs: Good air movement, crackles bilaterally Abdomen: Soft, nontender, nondistended, positive bowel sounds.  Neuro: Grossly intact, nonfocal.   Data Reviewed: Basic Metabolic Panel:  Recent Labs Lab 02/28/15 1354 03/01/15 0604 03/03/15 0341 03/04/15 0305  NA 131* 132* 131* 130*  K 4.3 3.4* 4.3 3.7  CL 100* 96* 94* 94*  CO2 22 25 27 28   GLUCOSE 107* 87 120* 102*  BUN 10 8 15  23*  CREATININE 0.65 0.78 0.71 0.71  CALCIUM 8.9 8.6* 8.6* 8.6*   Liver Function Tests:  Recent Labs Lab 02/28/15 1354  AST 80*  ALT 68*  ALKPHOS 120  BILITOT 0.9  PROT 7.1  ALBUMIN 3.4*   No results for input(s): LIPASE, AMYLASE in the last 168 hours. No results for input(s): AMMONIA in the last 168 hours. CBC:  Recent Labs Lab 02/28/15 1354 03/01/15 0604  WBC 8.4 8.6  HGB 13.8 14.4  HCT 41.5 43.0  MCV 86.8 86.7  PLT 332 293   Cardiac Enzymes:  Recent Labs Lab 02/28/15 1354 02/28/15 2005 03/01/15 0108 03/01/15 0604 03/01/15 1350  TROPONINI 0.08* 0.12* 0.10* 0.10* 0.09*   BNP (last 3 results)  Recent Labs  02/28/15 1354 03/01/15 0108  BNP 393.9* 491.9*    ProBNP (last 3 results) No results for input(s): PROBNP in the last 8760 hours.  CBG: No results for input(s): GLUCAP in the last 168 hours.  Recent Results (from the past 240 hour(s))  MRSA PCR Screening     Status: None   Collection Time: 02/28/15 11:22 PM  Result Value Ref Range Status   MRSA by PCR NEGATIVE NEGATIVE Final    Comment:        The  GeneXpert MRSA Assay (FDA approved for NASAL specimens only), is one component of a comprehensive MRSA colonization surveillance program. It is not intended to diagnose MRSA infection nor to guide or monitor treatment for MRSA infections.      Studies: No results found.  Scheduled Meds: . antiseptic oral rinse  7 mL Mouth Rinse q12n4p  . atorvastatin  10 mg Oral q1800  . budesonide (PULMICORT) nebulizer solution  0.25 mg Nebulization BID  . chlorhexidine  15 mL Mouth Rinse BID  . doxycycline  100 mg Oral Q12H  . escitalopram  10 mg Oral Daily  . feeding supplement (PRO-STAT SUGAR FREE 64)  30 mL Oral BID  . fluticasone  2 spray Each Nare Daily  . furosemide  40 mg Oral BID  . lisinopril  2.5 mg Oral Daily  . loratadine  10 mg Oral Daily  . pantoprazole  80 mg Oral Q1200  . tiotropium  18 mcg Inhalation Daily  . traZODone  200 mg Oral QHS   Continuous Infusions:    Marinda Elk  [facility] Pager [phone] If 7PM-7AM, please contact night-coverage at [email or web address], password [facility] 03/04/2015, 11:18 AM  LOS: 4 days

## 2015-03-04 NOTE — Progress Notes (Signed)
Pt right elbow noted to be pink but blanches when touched; foam dsg applied to both pt elbows for protection. Will closely monitor. Arabella Merles Revis Whalin RN.

## 2015-03-04 NOTE — Evaluation (Signed)
Occupational Therapy Evaluation Patient Details Name: [name] MRN: [id number] DOB: 21-Dec-1934 Today's Date: 03/04/2015    History of Present Illness 80yo woman with PMH of OA, Arib, pulmonary HTN, GERD, CHF, depression who presents due to increased fatigue and SOB. She had a TTE last year which showed a PA pressure of 66 making pulmonary HTN likely. She is on home O2 for chronic respiratory failure. Apparently, over the past few days she has not wanted to take her lasix b/c she did not want to have to go to the bathroom so much. She presented to her PCP today and was noted to have pOx of 89% on her home O2 of 4L. She was also noted to only be able to walk about 10 feet when she needed to stop and rest due to her SOB.   Clinical Impression   Pt ambulated 10 feet within her home holding on to her husband. She was assisted for sponge bathing and dressing, primarily for set up and LB.  She does not participate in IADL.  Pt presents with generalized weakness and poor activity tolerance.  Sat EOB x 8 minutes unsupported and stood with mod assist.  Instructed pt and husband in use of 3 in 1 and pursed lip breathing.  Will follow acutely.   Follow Up Recommendations  Home health OT;Supervision/Assistance - 24 hour    Equipment Recommendations  3 in 1 bedside comode    Recommendations for Other Services       Precautions / Restrictions Precautions Precautions: Fall Precaution Comments: watch sats Restrictions Weight Bearing Restrictions: No      Mobility Bed Mobility Overal bed mobility: Needs Assistance Bed Mobility: Supine to Sit;Sit to Supine;Rolling Rolling: Modified independent (Device/Increase time)   Supine to sit: Min assist Sit to supine: Min guard   General bed mobility comments: heavy use of rail, verbal cues for sequence, assist to scoot hips t EOB  Transfers Overall transfer level: Needs assistance   Transfers: Sit to/from Stand Sit to Stand: Mod assist          General transfer comment: stood with mod assist and took 2 steps to [facility] for positioning    Balance Overall balance assessment: Needs assistance Sitting-balance support: Feet supported Sitting balance-Leahy Scale: Fair Sitting balance - Comments: no UE support needed     Standing balance-Leahy Scale: Poor                              ADL Overall ADL's : Needs assistance/impaired Eating/Feeding: Set up;Bed level   Grooming: Wash/dry hands;Wash/dry face;Oral care;Sitting;Set up   Upper Body Bathing: Minimal assitance;Sitting   Lower Body Bathing: Maximal assistance;Sit to/from stand   Upper Body Dressing : Minimal assistance;Sitting   Lower Body Dressing: Maximal assistance;Sit to/from stand                 General ADL Comments: Instructed pt and husband on benefits and usage of  3 in 1.  Strongly recommended HHOT/PT.     Vision     Perception     Praxis      Pertinent Vitals/Pain Pain Assessment: No/denies pain     Hand Dominance Right   Extremity/Trunk Assessment Upper Extremity Assessment Upper Extremity Assessment: Generalized weakness   Lower Extremity Assessment Lower Extremity Assessment: Generalized weakness   Cervical / Trunk Assessment Cervical / Trunk Assessment: Kyphotic   Communication Communication Communication: No difficulties   Cognition Arousal/Alertness: Awake/alert Behavior During  Therapy: WFL for tasks assessed/performed Overall Cognitive Status: Within Functional Limits for tasks assessed (husband answers many questions for pt)                     General Comments       Exercises       Shoulder Instructions      Home Living Family/patient expects to be discharged to:: Private residence Living Arrangements: Spouse/significant other;Children (one son) Available Help at Discharge: Family;Available 24 hours/day Type of Home: House Home Access: Stairs to enter [employer] of Steps:  4 Entrance Stairs-Rails: Right Home Layout: Two level;Bed/bath upstairs;1/2 bath on main level     Bathroom Shower/Tub: Producer, television/film/video: Standard     Home Equipment: Wheelchair - manual          Prior Functioning/Environment Level of Independence: Needs assistance  Gait / Transfers Assistance Needed: pt held onto husband to walk PTA ADL's / Homemaking Assistance Needed: sponge bathes at shower is on second floor, husband assists with LEs and back, sets up washcloth   Comments: Pt states she sleeps on couch and uses 1/2 bath on lower level    OT Diagnosis: Generalized weakness   OT Problem List: Decreased strength;Decreased activity tolerance;Impaired balance (sitting and/or standing);Decreased knowledge of use of DME or AE;Cardiopulmonary status limiting activity   OT Treatment/Interventions: Self-care/ADL training;Energy conservation;DME and/or AE instruction;Patient/family education;Balance training;Therapeutic activities    OT Goals(Current goals can be found in the care plan section) Acute Rehab OT Goals Patient Stated Goal: to go home OT Goal Formulation: With patient Time For Goal Achievement: 03/11/15 Potential to Achieve Goals: Good ADL Goals Pt Will Perform Grooming: with min guard assist;standing (one activity) Pt Will Perform Upper Body Bathing: with supervision;sitting Pt Will Perform Upper Body Dressing: with supervision;sitting Pt Will Transfer to Toilet: with min guard assist;ambulating;regular height toilet Pt Will Perform Toileting - Clothing Manipulation and hygiene: sit to/from stand;with min assist Additional ADL Goal #1: Pt will utilize pursed lip breathing techniques during exertion with minimal cues.  OT Frequency: Min 2X/week   Barriers to D/C:            Co-evaluation              End of Session Equipment Utilized During Treatment: Gait belt;Oxygen (4L)  Activity Tolerance: Patient limited by fatigue Patient left: in  bed;with call bell/phone within reach;with family/visitor present   Time: 1113-1150 OT Time Calculation (min): 37 min Charges:  OT General Charges $OT Visit: 1 Procedure OT Evaluation $Initial OT Evaluation Tier I: 1 Procedure OT Treatments $Self Care/Home Management : 8-22 mins G-Codes:    Evern Bio 03/04/2015, 12:09 PM  [phone]

## 2015-03-04 NOTE — Progress Notes (Signed)
Name: [name] MRN: [id number] DOB: 1934/12/18    ADMISSION DATE:  02/28/2015 CONSULTATION DATE:  03/04/2015  REFERRING MD :  David Stall  CHIEF COMPLAINT:  SOB  BRIEF PATIENT DESCRIPTION: 80 y.o. woman followed by Dr Delton Coombes long term for mild obstructive lung disease, bronchiectasis and cough. 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 She is followed at [facility] cardiology for diastolic CHF and pacemaker placement for ablated A Flutter.   Over the last 12 months she has developed progressive exertional dyspnea and hypoxemia, ultimately requiring initiation of home O2. Her evaluation to date has been significant for PAH, TTE 12/25/14 showed LEVEF 55 - 60%, mild bilateral atrial enlargement, PASP 66 mmHg (increased from 38 mmHg 2 years prior). CT chest has not shown ILD (11/13/13), V/q without evidence chronic VTE (12/2014). Her RF and ANA are both elevated.   She was admitted with progressive dyspnea, hypoxemia, some mild increase in B interstitial infiltrates after she stopped taking her home lasix for a few days.   SIGNIFICANT EVENTS  7/8 - admitted. 7/10 - PCCM consulted.  STUDIES:  CXR 7/8 >>> reactive airway dz with superimposed CHF and mild edema.  SUBJECTIVE:  Feeling a bit better LE edema is improved Repeat RF and ANA are pending  VITAL SIGNS: Temp:  [97.7 F (36.5 C)-98.6 F (37 C)] 98 F (36.7 C) (07/12 0900) Pulse Rate:  [70-87] 84 (07/12 0900) Resp:  [18-22] 22 (07/12 0900) BP: (101-123)/(68-81) 101/73 mmHg (07/12 0900) SpO2:  [91 %-95 %] 95 % (07/12 0900) Weight:  [55.3 kg (121 lb 14.6 oz)] 55.3 kg (121 lb 14.6 oz) (07/12 5784)  PHYSICAL EXAMINATION: General: Chronically ill appearing female, resting in bed, in NAD. Neuro: A&O x 3, non-focal.  HEENT: [place]/AT. PERRL, sclerae anicteric. 4L O2 via [place] in place. Cardiovascular: IRIR, no M/R/G.  Lungs: Respirations even and unlabored.  Faint basilar crackles. Abdomen: BS x  4, soft, NT/ND.  Musculoskeletal: No gross deformities, no edema.  Skin: Intact, warm, no rashes.    Recent Labs Lab 03/01/15 0604 03/03/15 0341 03/04/15 0305  NA 132* 131* 130*  K 3.4* 4.3 3.7  CL 96* 94* 94*  CO2 BUN 8 15 23*  CREATININE 0.78 0.71 0.71  GLUCOSE 87 120* 102*    Recent Labs Lab 02/28/15 1354 03/01/15 0604  HGB 13.8 14.4  HCT 41.5 43.0  WBC 8.4 8.6  PLT 332 293   No results found.  ASSESSMENT / PLAN:  Acute on chronic hypoxic respiratory failure - progressive over 12 months PAH (PASP 66 by echo 12/25/14) - severe  -- Suspect that this is primarily Group 1 PAH, a manifestation of newly revealed connective tissue disease,  + component of Group 2 disease (from diastolic dysfxn and pacer dependence). Consider also any contribution of underlying lung disease (Group 3) but no significant ILD by CT or CXR to date. Her cardiac disease and her asthma have both been stable for years and have not appeared to change as her PAH has evolved.  Consider ? ILD in setting suspected connective tissue disease.  Hx bronchiectasis and asthma Recs:  Continue supplemental O2 as needed to maintain SpO2 > 90%. Continue her home spiriva, pulmicort as a formulary substitution for QVAR No role for systemic steroids at this time Have discussed case with cardiology, set for R and L heart cath 7/13 after INR corrected. Suspect she will need to start targeted PAH meds on this admission. Will  discuss with her based on cath results, check LFT in the am Follow recheck ANA and RF Consider High Res CT chest when we believe she has been adequately diuresed, probably this admit  Diastolic CHF. Atrial fib / flutter, ablated and paced. Recs:  Agree with diuresis; though caution with overshooting given severe PAH. Reversing coumadin to allow procedure 7/13   Levy Pupa, MD, PhD 03/04/2015, 10:58 AM [facility] [phone] or if no answer [phone]

## 2015-03-05 ENCOUNTER — Encounter (HOSPITAL_COMMUNITY)
Admission: EM | Disposition: A | Discharge status: Skilled Nursing Facility | Payer: Commercial Managed Care - HMO | Source: Home / Self Care | Attending: Internal Medicine

## 2015-03-05 ENCOUNTER — Encounter (HOSPITAL_COMMUNITY): Payer: Self-pay | Admitting: Cardiology

## 2015-03-05 DIAGNOSIS — I2510 Atherosclerotic heart disease of native coronary artery without angina pectoris: Secondary | ICD-10-CM

## 2015-03-05 DIAGNOSIS — I270 Primary pulmonary hypertension: Secondary | ICD-10-CM

## 2015-03-05 DIAGNOSIS — R0902 Hypoxemia: Secondary | ICD-10-CM

## 2015-03-05 DIAGNOSIS — R0600 Dyspnea, unspecified: Secondary | ICD-10-CM

## 2015-03-05 DIAGNOSIS — L03116 Cellulitis of left lower limb: Secondary | ICD-10-CM

## 2015-03-05 HISTORY — PX: CARDIAC CATHETERIZATION: SHX172

## 2015-03-05 LAB — POCT I-STAT 3, VENOUS BLOOD GAS (G3P V)
ACID-BASE EXCESS: 1 mmol/L (ref 0.0–2.0)
Acid-Base Excess: 2 mmol/L (ref 0.0–2.0)
Bicarbonate: 27.3 mEq/L — ABNORMAL HIGH (ref 20.0–24.0)
Bicarbonate: 27 mEq/L — ABNORMAL HIGH (ref 20.0–24.0)
O2 SAT: 60 %
O2 Saturation: 65 %
PCO2 VEN: 45.2 mmHg (ref 45.0–50.0)
PH VEN: 7.387 — AB (ref 7.250–7.300)
TCO2: 28 mmol/L (ref 0–100)
TCO2: 29 mmol/L (ref 0–100)
pCO2, Ven: 45 mmHg (ref 45.0–50.0)
pH, Ven: 7.388 — ABNORMAL HIGH (ref 7.250–7.300)
pO2, Ven: 32 mmHg (ref 30.0–45.0)
pO2, Ven: 34 mmHg (ref 30.0–45.0)

## 2015-03-05 LAB — CBC
HEMATOCRIT: 46.9 % — AB (ref 36.0–46.0)
Hemoglobin: 15.6 g/dL — ABNORMAL HIGH (ref 12.0–15.0)
MCH: 28.6 pg (ref 26.0–34.0)
MCHC: 33.3 g/dL (ref 30.0–36.0)
MCV: 86.1 fL (ref 78.0–100.0)
Platelets: 371 10*3/uL (ref 150–400)
RBC: 5.45 MIL/uL — AB (ref 3.87–5.11)
RDW: 15.1 % (ref 11.5–15.5)
WBC: 11.2 10*3/uL — AB (ref 4.0–10.5)

## 2015-03-05 LAB — COMPREHENSIVE METABOLIC PANEL
ALK PHOS: 123 U/L (ref 38–126)
ALT: 83 U/L — AB (ref 14–54)
ANION GAP: 9 (ref 5–15)
AST: 93 U/L — ABNORMAL HIGH (ref 15–41)
Albumin: 2.9 g/dL — ABNORMAL LOW (ref 3.5–5.0)
BUN: 25 mg/dL — ABNORMAL HIGH (ref 6–20)
CHLORIDE: 95 mmol/L — AB (ref 101–111)
CO2: 25 mmol/L (ref 22–32)
CREATININE: 0.74 mg/dL (ref 0.44–1.00)
Calcium: 8.5 mg/dL — ABNORMAL LOW (ref 8.9–10.3)
GFR calc Af Amer: >60 mL/min (ref >60)
GFR calc non Af Amer: >60 mL/min (ref >60)
Glucose, Bld: 120 mg/dL — ABNORMAL HIGH (ref 65–99)
Potassium: 4.4 mmol/L (ref 3.5–5.1)
Sodium: 129 mmol/L — ABNORMAL LOW (ref 135–145)
Total Bilirubin: 0.8 mg/dL (ref 0.3–1.2)
Total Protein: 7.3 g/dL (ref 6.5–8.1)

## 2015-03-05 LAB — POCT ACTIVATED CLOTTING TIME
ACTIVATED CLOTTING TIME: 183 s
Activated Clotting Time: 276 seconds

## 2015-03-05 LAB — ANTINUCLEAR ANTIBODIES, IFA

## 2015-03-05 LAB — FANA STAINING PATTERNS: Homogeneous Pattern: 1:160 {titer} — ABNORMAL HIGH

## 2015-03-05 LAB — RHEUMATOID FACTOR: RHEUMATOID FACTOR: 78.2 [IU]/mL — AB (ref 0.0–13.9)

## 2015-03-05 LAB — PROTIME-INR
INR: 1.57 — AB (ref 0.00–1.49)
PROTHROMBIN TIME: 18.8 s — AB (ref 11.6–15.2)

## 2015-03-05 LAB — ANTI-DNA ANTIBODY, DOUBLE-STRANDED: ds DNA Ab: <1 IU/mL (ref 0–9)

## 2015-03-05 SURGERY — RIGHT/LEFT HEART CATH AND CORONARY ANGIOGRAPHY

## 2015-03-05 MED ORDER — WARFARIN SODIUM 4 MG PO TABS
4.0000 mg | ORAL_TABLET | Freq: Once | ORAL | Status: AC
  Administered 2015-03-05: 4 mg via ORAL
  Filled 2015-03-05: qty 1

## 2015-03-05 MED ORDER — WARFARIN - PHARMACIST DOSING INPATIENT: Freq: Every day | Status: DC

## 2015-03-05 MED ORDER — FENTANYL CITRATE (PF) 100 MCG/2ML IJ SOLN
INTRAMUSCULAR | Status: AC
  Filled 2015-03-05: qty 2

## 2015-03-05 MED ORDER — IOHEXOL 350 MG/ML SOLN
INTRAVENOUS | Status: DC | PRN
  Administered 2015-03-05: 65 mL via INTRA_ARTERIAL

## 2015-03-05 MED ORDER — ONDANSETRON HCL 4 MG/2ML IJ SOLN: 4.0000 mg | Freq: Four times a day (QID) | INTRAMUSCULAR | Status: DC | PRN

## 2015-03-05 MED ORDER — SODIUM CHLORIDE 0.9 % WEIGHT BASED INFUSION: 3.0000 mL/kg/h | INTRAVENOUS | Status: AC

## 2015-03-05 MED ORDER — VERAPAMIL HCL 2.5 MG/ML IV SOLN
INTRAVENOUS | Status: AC
  Filled 2015-03-05: qty 2

## 2015-03-05 MED ORDER — HEPARIN (PORCINE) IN NACL 2-0.9 UNIT/ML-% IJ SOLN
INTRAMUSCULAR | Status: AC
  Filled 2015-03-05: qty 1000

## 2015-03-05 MED ORDER — FENTANYL CITRATE (PF) 100 MCG/2ML IJ SOLN
INTRAMUSCULAR | Status: DC | PRN
  Administered 2015-03-05: 25 ug via INTRAVENOUS

## 2015-03-05 MED ORDER — SODIUM CHLORIDE 0.9 % IV SOLN: 250.0000 mL | INTRAVENOUS | Status: DC | PRN

## 2015-03-05 MED ORDER — ACETAMINOPHEN 325 MG PO TABS: 650.0000 mg | ORAL_TABLET | ORAL | Status: DC | PRN

## 2015-03-05 MED ORDER — VERAPAMIL HCL 2.5 MG/ML IV SOLN
INTRAVENOUS | Status: DC | PRN
  Administered 2015-03-05: 13:00:00 via INTRA_ARTERIAL

## 2015-03-05 MED ORDER — LIDOCAINE HCL (PF) 1 % IJ SOLN
INTRAMUSCULAR | Status: AC
  Filled 2015-03-05: qty 30

## 2015-03-05 MED ORDER — HEPARIN SODIUM (PORCINE) 1000 UNIT/ML IJ SOLN
INTRAMUSCULAR | Status: DC | PRN
  Administered 2015-03-05: 3500 [IU] via INTRAVENOUS

## 2015-03-05 MED ORDER — HEPARIN (PORCINE) IN NACL 100-0.45 UNIT/ML-% IJ SOLN
700.0000 [IU]/h | INTRAMUSCULAR | Status: DC
  Administered 2015-03-05: 700 [IU]/h via INTRAVENOUS
  Filled 2015-03-05: qty 250

## 2015-03-05 MED ORDER — SILDENAFIL CITRATE 20 MG PO TABS
20.0000 mg | ORAL_TABLET | Freq: Three times a day (TID) | ORAL | Status: DC
  Administered 2015-03-05: 20 mg via ORAL
  Filled 2015-03-05 (×4): qty 1

## 2015-03-05 MED ORDER — SODIUM CHLORIDE 0.9 % IJ SOLN: 3.0000 mL | INTRAMUSCULAR | Status: DC | PRN

## 2015-03-05 MED ORDER — HEPARIN SODIUM (PORCINE) 1000 UNIT/ML IJ SOLN
INTRAMUSCULAR | Status: AC
  Filled 2015-03-05: qty 1

## 2015-03-05 MED ORDER — MIDAZOLAM HCL 2 MG/2ML IJ SOLN
INTRAMUSCULAR | Status: AC
  Filled 2015-03-05: qty 2

## 2015-03-05 MED ORDER — MIDAZOLAM HCL 2 MG/2ML IJ SOLN
INTRAMUSCULAR | Status: DC | PRN
  Administered 2015-03-05 (×2): 0.5 mg via INTRAVENOUS

## 2015-03-05 MED ORDER — SODIUM CHLORIDE 0.9 % IJ SOLN
3.0000 mL | Freq: Two times a day (BID) | INTRAMUSCULAR | Status: DC
  Administered 2015-03-05 – 2015-03-08 (×6): 3 mL via INTRAVENOUS

## 2015-03-05 SURGICAL SUPPLY — 22 items
CATH BALLN WEDGE 5F 110CM (CATHETERS) ×1 IMPLANT
CATH INFINITI 5 FR 3DRC (CATHETERS) ×2 IMPLANT
CATH INFINITI 5 FR JL3.5 (CATHETERS) ×3 IMPLANT
CATH INFINITI 5 FR MPA2 (CATHETERS) IMPLANT
CATH INFINITI 5FR ANG PIGTAIL (CATHETERS) ×3 IMPLANT
CATH INFINITI JR4 5F (CATHETERS) ×3 IMPLANT
CATH SWAN GANZ 7F STRAIGHT (CATHETERS) ×2 IMPLANT
DEVICE RAD COMP TR BAND LRG (VASCULAR PRODUCTS) ×3 IMPLANT
GLIDESHEATH SLEND SS 6F .021 (SHEATH) ×5 IMPLANT
KIT HEART LEFT (KITS) ×3 IMPLANT
KIT HEART RIGHT NAMIC (KITS) ×3 IMPLANT
PACK CARDIAC CATHETERIZATION (CUSTOM PROCEDURE TRAY) ×3 IMPLANT
SHEATH FAST CATH BRACH 5F 5CM (SHEATH) ×1 IMPLANT
SHEATH PINNACLE 5F 10CM (SHEATH) IMPLANT
SHEATH PINNACLE 7F 10CM (SHEATH) ×2 IMPLANT
SYR MEDRAD MARK V 150ML (SYRINGE) ×3 IMPLANT
TRANSDUCER W/STOPCOCK (MISCELLANEOUS) ×6 IMPLANT
TUBING CIL FLEX 10 FLL-RA (TUBING) ×3 IMPLANT
WIRE EMERALD 3MM-J .035X150CM (WIRE) IMPLANT
WIRE HI TORQ VERSACORE-J 145CM (WIRE) ×5 IMPLANT
WIRE MICROINTRODUCER 60CM (WIRE) ×2 IMPLANT
WIRE SAFE-T 1.5MM-J .035X260CM (WIRE) ×3 IMPLANT

## 2015-03-05 NOTE — Progress Notes (Signed)
Pt TR band removed at 1630; clean dry sterile dsg applied; site remains level 0 with no hematoma or active bleeding noted. Will closely monitor per order and protocol. Arabella Merles Ollin Hochmuth RN.

## 2015-03-05 NOTE — Clinical Social Work Note (Signed)
Clinical Social Work Assessment  Patient Details  Name: [name] MRN: [id number] Date of Birth: 21-Nov-1934  Date of referral:  03/05/15               Reason for consult:  Facility Placement                Permission sought to share information with:  Facility Sport and exercise psychologist, Family Supports Permission granted to share information::  Yes, Verbal Permission Granted  Name::     [name], [name], [name]  Agency::     Relationship::     Contact Information:     Housing/Transportation Living arrangements for the past 2 months:  Single Family Home Source of Information:  Patient, Adult Children, Spouse Patient Interpreter Needed:  None Criminal Activity/Legal Involvement Pertinent to Current Situation/Hospitalization:  No - Comment as needed Significant Relationships:  Spouse, Adult Children Lives with:    Do you feel safe going back to the place where you live?  Yes Need for family participation in patient care:  Yes (Comment)   Care giving concerns:  [name] and one of patient's [name]  (John) at bedside and were asking to talk to [place] about PT's recommendation for short term SNF per PT.  They had hoped to be able to return home at d/c but feel that SNF will be the best choice at this time. She was independent prior to d/c.   Social Worker assessment / plan:  CSW met patient, [name] and [name] to discuss SNF placement recommendation and process. CSW answered questions and concerns and will initiate referral. They do not have any knowledge of area SNF's or a preference for placement but had heard of [facility].  Fl2 will be initiated and sent to area facilities for review.  Employment status:  Retired Nurse, adult PT Recommendations:  [place] / Referral to community resources:  [place]  Patient/Family's Response to care:  Patient and family are agreeable to current plan and are  realistic about her need to seek SNF placement until she is strong enough to return. Patient feels stronger and is confident that she will be able to progress rapidly to return home.  Her [name] and son also have a very positive attitude about her recovery.  Patient/Family's Understanding of and Emotional Response to Diagnosis, Current Treatment, and Prognosis:  Patient and family understand current condition, diagnosis and treatment needs.  They are hopeful for placement in [facility] if possible.They were appreciative of information provided about placement and state they will await follow up visit re: bed offers and will decide which facility to accept at that time.  Emotional Assessment Appearance:  Appears stated age Attitude/Demeanor/Rapport:   (Attitude and demeanor is appropriate for age and situation) Affect (typically observed):  Calm, Happy, Pleasant, Accepting Orientation:  Oriented to Self, Oriented to Place, Oriented to  Time, Oriented to Situation Alcohol / Substance use:    None Psych involvement (Current and /or in the community):No     Discharge Needs  Concerns to be addressed:   SNF  Readmission within the last 30 days:  No Current discharge risk:  None Barriers to Discharge:  No Barriers Identified   Williemae Area, LCSW 03/05/2015, [name]:55 pm [phone]

## 2015-03-05 NOTE — Progress Notes (Signed)
Patient pending RHC per Dr. Shirlee Latch.  Await results, PCCM will follow up post procedure.     Canary Brim, NP-C [facility] Pgr: [phone] or if no answer [phone] 03/05/2015, 10:40 AM

## 2015-03-05 NOTE — Progress Notes (Signed)
ANTICOAGULATION CONSULT NOTE   Pharmacy Consult for Heparin Indication: atrial fibrillation  Allergies  Allergen Reactions  . Benzoyl Peroxide Swelling    Swelling at the site of use  . Neomycin-Bacitracin Zn-Polymyx Swelling    Swelling at the site of use    Patient Measurements: Height: 4\' 11"  (149.9 cm) Weight: 121 lb 14.6 oz (55.3 kg) (scale a) IBW/kg (Calculated) : 43.2   Vital Signs: Temp: 97.8 F (36.6 C) (07/12 2135) Temp Source: Oral (07/12 2135) BP: 102/67 mmHg (07/12 2135) Pulse Rate: 82 (07/12 2135)  Labs:  Recent Labs  03/03/15 0341 03/04/15 0305 03/04/15 1535 03/05/15 0258  HGB  --   --   --  15.6*  HCT  --   --   --  46.9*  PLT  --   --   --  371  LABPROT 25.9* 29.0* 23.3* 18.8*  INR 2.40* 2.79* 2.09* 1.57*  CREATININE 0.71 0.71  --  0.74    Estimated Creatinine Clearance: 44.6 mL/min (by C-G formula based on Cr of 0.74).   Assessment: 24 YOF admitted on 02/28/15 with acute on chronic respiratory failure/cellulitis, takes warfarin PTA (Warfarin 2.5 mg on Tues/Thurs/Sun, 1.25 mg all other days from anticoag outpatient notes).  Spoke with pt about home dose and is taking 2.5 mg T/Th, 1.25 mg AOD. -Coumadin on hold for cath. Reversed with vit K 1mg  IV yesterday. INR now down to 1.57. Order to start heparin when INR < 2. Plan for cath today at 1230. CBC stable.   Goal of Therapy:  Heparin level 0.3-0.7 units/ml Monitor platelets by anticoagulation protocol: Yes   Plan:  - Start heparin gtt at 700 units/hr. No bolus. - Will f/u post cath today  Christoper Fabian, PharmD, BCPS Clinical pharmacist, pager [phone] 03/05/2015,5:30 AM

## 2015-03-05 NOTE — Progress Notes (Signed)
RE: Benefit check      Nia A Shealy CMA           Prior Berkley Harvey is required-[phone]- copay will be $2,306.77- price may reduce after Berkley Harvey is given.

## 2015-03-05 NOTE — H&P (View-Only) (Signed)
Patient ID: [name], female   DOB: 1935-08-16, 80 y.o.   MRN: [id number]   80 yo with history of permanent atrial fibrillation, sinus node dysfunction s/p MDT PPM, Takotsubo CMP (resolved, 9/07), idiopathic eosinophilia, bronchiectasis with mild obstructive PFTs, CAD with moderate LAD and RCA disease by cath in 2012, diastolic CHF and suspect PAH was admitted with edema, weight gain, progressive dyspnea.  She has been slowly worsening over the last few months.  She has been on 4 L home oxygen.   This admission, she was diuresed with IV Lasix.  Last echo in 5/16 showed EF 55-60%, PA systolic pressure 66 mmHg, RV apparently normal (will need to review).  She was seen by Dr Royann Shivers who recommended right and left heart cath (workup pulmonary hypertension and also with history of CAD).   Weight is down by about 11 lbs now.  She is still short of breath with ambulation.   Scheduled Meds: . antiseptic oral rinse  7 mL Mouth Rinse q12n4p  . atorvastatin  10 mg Oral q1800  . budesonide (PULMICORT) nebulizer solution  0.25 mg Nebulization BID  . chlorhexidine  15 mL Mouth Rinse BID  . doxycycline  100 mg Oral BID  . escitalopram  10 mg Oral Daily  . feeding supplement (PRO-STAT SUGAR FREE 64)  30 mL Oral BID  . fluticasone  2 spray Each Nare Daily  . furosemide  40 mg Intravenous BID  . lisinopril  2.5 mg Oral Daily  . loratadine  10 mg Oral Daily  . pantoprazole  80 mg Oral Q1200  . potassium chloride  40 mEq Oral Daily  . sodium chloride  3 mL Intravenous Q12H  . tiotropium  18 mcg Inhalation Daily  . traZODone  200 mg Oral QHS   Continuous Infusions: . sodium chloride 10 mL/hr at 03/05/15 0554  . heparin 700 Units/hr (03/05/15 0554)   PRN Meds:.sodium chloride, albuterol, sodium chloride   Filed Vitals:   03/04/15 1915 03/04/15 2135 03/05/15 0532 03/05/15 0729  BP:  102/67 104/74   Pulse:  82 80   Temp:  97.8 F (36.6 C) 97.6 F (36.4 C)   TempSrc:  Oral Oral   Resp:  18 18     Height:      Weight:   121 lb 1.9 oz (54.939 kg)   SpO2: 92% 96% 98% 97%    Intake/Output Summary (Last 24 hours) at 03/05/15 0816 Last data filed at 03/05/15 0718  Gross per 24 hour  Intake    840 ml  Output 1250.5 ml  Net -410.5 ml    LABS: Basic Metabolic Panel:  Recent Labs  40/98/11 0305 03/05/15 0258  NA 130* 129*  K 3.7 4.4  CL 94* 95*  CO2 28 25  GLUCOSE 102* 120*  BUN 23* 25*  CREATININE 0.71 0.74  CALCIUM 8.6* 8.5*   Liver Function Tests:  Recent Labs  03/05/15 0258  AST 93*  ALT 83*  ALKPHOS 123  BILITOT 0.8  PROT 7.3  ALBUMIN 2.9*   No results for input(s): LIPASE, AMYLASE in the last 72 hours. CBC:  Recent Labs  03/05/15 0258  WBC 11.2*  HGB 15.6*  HCT 46.9*  MCV 86.1  PLT 371   Cardiac Enzymes: No results for input(s): CKTOTAL, CKMB, CKMBINDEX, TROPONINI in the last 72 hours. BNP: Invalid input(s): POCBNP D-Dimer: No results for input(s): DDIMER in the last 72 hours. Hemoglobin A1C: No results for input(s): HGBA1C in the last 72 hours. Fasting  Lipid Panel: No results for input(s): CHOL, HDL, LDLCALC, TRIG, CHOLHDL, LDLDIRECT in the last 72 hours. Thyroid Function Tests: No results for input(s): TSH, T4TOTAL, T3FREE, THYROIDAB in the last 72 hours.  Invalid input(s): FREET3 Anemia Panel: No results for input(s): VITAMINB12, FOLATE, FERRITIN, TIBC, IRON, RETICCTPCT in the last 72 hours.  RADIOLOGY: Dg Chest 2 View  02/28/2015   CLINICAL DATA:  Shortness of breath and lower extremity pitting edema ; history of atrial fibrillation and asthma  EXAM: CHEST  2 VIEW  COMPARISON:  PA and lateral chest of Jan 17, 2015  FINDINGS: The lungs are mildly hyperinflated. The interstitial markings are increased. The pulmonary vascularity is engorged. The cardiac silhouette is mildly enlarged. A permanent pacemaker is in place with appropriate positioning of the electrodes. There is no pleural effusion. The bony thorax exhibits no acute abnormality.   IMPRESSION: Reactive airway disease with superimposed CHF and mild interstitial edema. There is no alveolar pneumonia.   Electronically Signed   By: David  [place] M.D.   On: 02/28/2015 14:17   Dg Wrist Complete Right  02/28/2015   CLINICAL DATA:  Wrist pain.  No known injury.  Initial evaluation.  EXAM: RIGHT WRIST - COMPLETE 3+ VIEW  COMPARISON:  None.  FINDINGS: Diffuse osteopenia and degenerative change. No acute bony or joint abnormality identified. No evidence of fracture or dislocation.  IMPRESSION: Negative.   Electronically Signed   By: Maisie Fus  Register   On: 02/28/2015 14:18   US Venous Img Lower Unilateral Left  02/28/2015   CLINICAL DATA:  Left lower extremity swelling for 2 months.  EXAM: LEFT LOWER EXTREMITY VENOUS DOPPLER ULTRASOUND  TECHNIQUE: Gray-scale sonography with graded compression, as well as color Doppler and duplex ultrasound were performed to evaluate the lower extremity deep venous systems from the level of the common femoral vein and including the common femoral, femoral, profunda femoral, popliteal and calf veins including the posterior tibial, peroneal and gastrocnemius veins when visible. The superficial great saphenous vein was also interrogated. Spectral Doppler was utilized to evaluate flow at rest and with distal augmentation maneuvers in the common femoral, femoral and popliteal veins.  COMPARISON:  None.  FINDINGS: Contralateral Common Femoral Vein: Respiratory phasicity is normal and symmetric with the symptomatic side. No evidence of thrombus. Normal compressibility.  Common Femoral Vein: No evidence of thrombus. Normal compressibility, respiratory phasicity and response to augmentation.  Saphenofemoral Junction: No evidence of thrombus. Normal compressibility and flow on color Doppler imaging.  Profunda Femoral Vein: No evidence of thrombus. Normal compressibility and flow on color Doppler imaging.  Femoral Vein: No evidence of thrombus. Normal compressibility,  respiratory phasicity and response to augmentation.  Popliteal Vein: No evidence of thrombus. Normal compressibility, respiratory phasicity and response to augmentation.  Calf Veins: No evidence of thrombus. Normal compressibility and flow on color Doppler imaging.  Superficial Great Saphenous Vein: No evidence of thrombus. Normal compressibility and flow on color Doppler imaging.  Venous Reflux:  None.  Other Findings:  None.  IMPRESSION: No evidence of deep venous thrombosis.   Electronically Signed   By: Myles Rosenthal M.D.   On: 02/28/2015 17:11   Mm Digital Screening Bilateral  02/04/2015   CLINICAL DATA:  Screening.  EXAM: DIGITAL SCREENING BILATERAL MAMMOGRAM WITH CAD  COMPARISON:  Previous exam(s).  ACR Breast Density Category c: The breast tissue is heterogeneously dense, which may obscure small masses.  FINDINGS: There are no findings suspicious for malignancy. Images were processed with CAD.  IMPRESSION: No mammographic evidence  of malignancy. A result letter of this screening mammogram will be mailed directly to the patient.  RECOMMENDATION: Screening mammogram in one year. (Code:SM-B-01Y)  BI-RADS CATEGORY  1: Negative.   Electronically Signed   By: Dalphine Handing M.D.   On: 02/04/2015 18:49    PHYSICAL EXAM General: NAD Neck: JVP 7-8 cm, no thyromegaly or thyroid nodule.  Lungs: Clear to auscultation bilaterally with normal respiratory effort. CV: Nondisplaced PMI.  Heart regular S1/S2, no S3/S4, no murmur.  No edema.  No carotid bruit.  Normal pedal pulses.  Abdomen: Soft, nontender, no hepatosplenomegaly, no distention.  Neurologic: Alert and oriented x 3.  Psych: Normal affect. Extremities: No clubbing or cyanosis.   TELEMETRY: Reviewed telemetry pt in atrial fibrillation, controlled rate  ASSESSMENT AND PLAN: 79 yo with history of permanent atrial fibrillation, sinus node dysfunction s/p MDT PPM, Takotsubo CMP (resolved, 9/07), idiopathic eosinophilia, bronchiectasis with mild  obstructive PFTs, CAD with moderate LAD and RCA disease by cath in 2012, diastolic CHF and suspect PAH was admitted with edema, weight gain, progressive dyspnea.  She has been slowly worsening over the last few months.  She has been on 4 L home oxygen.  1. Acute on chronic diastolic CHF: EF 81-27% on 5/16 echo with PASP 66 mmHg.  I am concerned that pulmonary arterial hypertension is playing a large role in her presentation.  She remains short of breath with ambulation but weight is down and she does not appear to have much volume on exam. - Hold Lasix for now until RHC later today, will decide on what to do with diuretics going forward after RHC.   - Will plan RHC/LHC today.  Discussed risks/benefits again with patient and husband and they agree to proceed.  2. Pulmonary hypertension: I am concerned for PAH, possibly group 1 related to rheumatological disease.  RF was positive and ANA weakly positive in 5/16.  She has a history of bronchiectasis with mild obstructive PFTs.  CT chest in 3/15 did not show ILD but she is much more symptomatic now.  V/Q scan in 5/16 did not show acute or chronic PE.   - Needs rheum workup: ?RA => will send CCP antibody (re-order, does not appear to have been sent yesterday) and anti-dsDNA antibody.   - ?ILD related to rheumatological disease, ?RA => will need high resolution non-contrast chest CT when fully diuresed. Will order if filling pressures look ok on RHC.  - RHC today to assess PA pressure and filling pressures.  3. CAD: Moderate CAD on LHC in 2012.  Given exertional symptoms and known CAD, agree with Dr Royann Shivers that coronary angiography along with RHC is warranted.  Continue atorvastatin.  Not on ASA as she has been on warfarin.  4. Atrial fibrillation: Permanent it appears at this point.  HR not elevated, not on rate control meds.  Warfarin held for cath.  On heparin gtt for INR < 2, will hold prior to cath.   5. PPM: MDT, h/o SN dysfunction.  6. H/o Takotsubo  CMP: resolved.   Marca Ancona 03/05/2015 8:16 AM

## 2015-03-05 NOTE — Progress Notes (Signed)
Site area: Right groin a 7 french venous sheath was removed  Site Prior to Removal:  Level 0  Pressure Applied For 15 MINUTES    Minutes Beginning at 1455p  Manual:   Yes.    Patient Status During Pull:  stable  Post Pull Groin Site:  Level 0  Post Pull Instructions Given:  Yes.    Post Pull Pulses Present:  Yes.    Dressing Applied:  Yes.    Comments:  VS remain stable during sheath pull.  Pt denies any discomfort at this time at site.

## 2015-03-05 NOTE — Progress Notes (Signed)
Benefit check in progress for Letaris 5mg  qday. Abelino Derrick [facility] [phone]

## 2015-03-05 NOTE — Progress Notes (Signed)
PT Cancellation Note  Patient Details Name: [name] MRN: [id number] DOB: 1934-11-05   Cancelled Treatment:    Reason Eval/Treat Not Completed: Patient not medically ready (bedrest until 1710 due to cath). Will check back tomorrow.    Vista Deck 03/05/2015, 4:53 PM

## 2015-03-05 NOTE — Progress Notes (Signed)
PCCM Interval Note  RHC results reviewed, appreciate Dr Alford Highland assistance. I discussed the results with pt, explained that she likely has PAH related to auto-immune disease (labs from May negative w exception RF and ANA). I would like to start her on therapy while she is admitted, could initiate ambrisentan but note LFT's elevated, probably due to her PAH itself and hepatic congestion (Hepatitis panel negative). Will instead start PDE-5 inhibitor - ultimately would prefer tadalafil, but not on inpatient formulary. Will start sildenafil 20mg  tid and discuss overall plan further with Dr Shirlee Latch 7/14. Will need a stable diuretic regimen, and we will need to continue warfarin.   Levy Pupa, MD, PhD 03/05/2015, 5:40 PM [facility] [phone] or if no answer [phone]

## 2015-03-05 NOTE — Interval H&P Note (Signed)
History and Physical Interval Note:  03/05/2015 12:41 PM Cath Lab Visit (complete for each Cath Lab visit)  Clinical Evaluation Leading to the Procedure:   ACS: No.  Non-ACS:    Anginal Classification: CCS III  Anti-ischemic medical therapy: No Therapy  Non-Invasive Test Results: No non-invasive testing performed  Prior CABG: No previous CABG        [name]  has presented today for surgery, with the diagnosis of cp/hf  The various methods of treatment have been discussed with the patient and family. After consideration of risks, benefits and other options for treatment, the patient has consented to  Procedure(s): Right/Left Heart Cath and Coronary Angiography (N/A) as a surgical intervention .  The patient's history has been reviewed, patient examined, no change in status, stable for surgery.  I have reviewed the patient's chart and labs.  Questions were answered to the patient's satisfaction.     [name] [employer]

## 2015-03-05 NOTE — Progress Notes (Signed)
Patient Information: 1. Patients With Known Obstructive CAD (e.g., Prior MI, Prior PCI, Prior CABG, or Obstructive Disease on Invasive Angiography) 2. Medically Managed Patient (No prior PCI, no prior CABG) 3. High-risk noninvasive findings 4. Worsening or Limiting Symptoms AND Worsening Findings A (9) Indication: 51;  Marca Ancona 03/05/2015

## 2015-03-05 NOTE — Clinical Social Work Placement (Addendum)
   CLINICAL SOCIAL WORK PLACEMENT  NOTE  Date:  03/05/2015  Patient Details  Name: [name] MRN: [id number] Date of Birth: 1935/03/23  Clinical Social Work is seeking post-discharge placement for this patient at the Skilled  Nursing Facility level of care (*CSW will initial, date and re-position this form in  chart as items are completed):  Yes   Patient/family provided with [facility] Clinical Social Work Department's list of facilities offering this level of care within the geographic area requested by the patient (or if unable, by the patient's family).  Yes   Patient/family informed of their freedom to choose among providers that offer the needed level of care, that participate in Medicare, Medicaid or managed care program needed by the patient, have an available bed and are willing to accept the patient.  Yes   Patient/family informed of 's ownership interest in [facility], as well as of the fact that they are under no obligation to receive care at these facilities.  PASRR submitted to EDS on 03/05/15     PASRR number received on 03/05/15     Existing PASRR number confirmed on       FL2 transmitted to all facilities in geographic area requested by pt/family on 03/05/15     FL2 transmitted to all facilities within larger geographic area on       Patient informed that his/her managed care company has contracts with or will negotiate with certain facilities, including the following:   ([employer] [facility])         Patient/family informed of bed offers received.  Patient chooses bed at       Physician recommends and patient chooses bed at      Patient to be transferred to   on  .  Patient to be transferred to facility by Ambulance Sharin Mons)     Patient family notified on   of transfer.  Name of family member notified:        PHYSICIAN Please prepare priority discharge summary, including medications, Please sign FL2, Please  prepare prescriptions     Additional Comment:    _______________________________________________ Darylene Price, LCSW 03/05/2015,  4:15 pm

## 2015-03-05 NOTE — Progress Notes (Signed)
Patient ID: [name], female   DOB: 1935-08-16, 80 y.o.   MRN: [id number]   80 yo with history of permanent atrial fibrillation, sinus node dysfunction s/p MDT PPM, Takotsubo CMP (resolved, 9/07), idiopathic eosinophilia, bronchiectasis with mild obstructive PFTs, CAD with moderate LAD and RCA disease by cath in 2012, diastolic CHF and suspect PAH was admitted with edema, weight gain, progressive dyspnea.  She has been slowly worsening over the last few months.  She has been on 4 L home oxygen.   This admission, she was diuresed with IV Lasix.  Last echo in 5/16 showed EF 55-60%, PA systolic pressure 66 mmHg, RV apparently normal (will need to review).  She was seen by Dr Royann Shivers who recommended right and left heart cath (workup pulmonary hypertension and also with history of CAD).   Weight is down by about 11 lbs now.  She is still short of breath with ambulation.   Scheduled Meds: . antiseptic oral rinse  7 mL Mouth Rinse q12n4p  . atorvastatin  10 mg Oral q1800  . budesonide (PULMICORT) nebulizer solution  0.25 mg Nebulization BID  . chlorhexidine  15 mL Mouth Rinse BID  . doxycycline  100 mg Oral BID  . escitalopram  10 mg Oral Daily  . feeding supplement (PRO-STAT SUGAR FREE 64)  30 mL Oral BID  . fluticasone  2 spray Each Nare Daily  . furosemide  40 mg Intravenous BID  . lisinopril  2.5 mg Oral Daily  . loratadine  10 mg Oral Daily  . pantoprazole  80 mg Oral Q1200  . potassium chloride  40 mEq Oral Daily  . sodium chloride  3 mL Intravenous Q12H  . tiotropium  18 mcg Inhalation Daily  . traZODone  200 mg Oral QHS   Continuous Infusions: . sodium chloride 10 mL/hr at 03/05/15 0554  . heparin 700 Units/hr (03/05/15 0554)   PRN Meds:.sodium chloride, albuterol, sodium chloride   Filed Vitals:   03/04/15 1915 03/04/15 2135 03/05/15 0532 03/05/15 0729  BP:  102/67 104/74   Pulse:  82 80   Temp:  97.8 F (36.6 C) 97.6 F (36.4 C)   TempSrc:  Oral Oral   Resp:  18 18     Height:      Weight:   121 lb 1.9 oz (54.939 kg)   SpO2: 92% 96% 98% 97%    Intake/Output Summary (Last 24 hours) at 03/05/15 0816 Last data filed at 03/05/15 0718  Gross per 24 hour  Intake    840 ml  Output 1250.5 ml  Net -410.5 ml    LABS: Basic Metabolic Panel:  Recent Labs  40/98/11 0305 03/05/15 0258  NA 130* 129*  K 3.7 4.4  CL 94* 95*  CO2 28 25  GLUCOSE 102* 120*  BUN 23* 25*  CREATININE 0.71 0.74  CALCIUM 8.6* 8.5*   Liver Function Tests:  Recent Labs  03/05/15 0258  AST 93*  ALT 83*  ALKPHOS 123  BILITOT 0.8  PROT 7.3  ALBUMIN 2.9*   No results for input(s): LIPASE, AMYLASE in the last 72 hours. CBC:  Recent Labs  03/05/15 0258  WBC 11.2*  HGB 15.6*  HCT 46.9*  MCV 86.1  PLT 371   Cardiac Enzymes: No results for input(s): CKTOTAL, CKMB, CKMBINDEX, TROPONINI in the last 72 hours. BNP: Invalid input(s): POCBNP D-Dimer: No results for input(s): DDIMER in the last 72 hours. Hemoglobin A1C: No results for input(s): HGBA1C in the last 72 hours. Fasting  Lipid Panel: No results for input(s): CHOL, HDL, LDLCALC, TRIG, CHOLHDL, LDLDIRECT in the last 72 hours. Thyroid Function Tests: No results for input(s): TSH, T4TOTAL, T3FREE, THYROIDAB in the last 72 hours.  Invalid input(s): FREET3 Anemia Panel: No results for input(s): VITAMINB12, FOLATE, FERRITIN, TIBC, IRON, RETICCTPCT in the last 72 hours.  RADIOLOGY: Dg Chest 2 View  02/28/2015   CLINICAL DATA:  Shortness of breath and lower extremity pitting edema ; history of atrial fibrillation and asthma  EXAM: CHEST  2 VIEW  COMPARISON:  PA and lateral chest of Jan 17, 2015  FINDINGS: The lungs are mildly hyperinflated. The interstitial markings are increased. The pulmonary vascularity is engorged. The cardiac silhouette is mildly enlarged. A permanent pacemaker is in place with appropriate positioning of the electrodes. There is no pleural effusion. The bony thorax exhibits no acute abnormality.   IMPRESSION: Reactive airway disease with superimposed CHF and mild interstitial edema. There is no alveolar pneumonia.   Electronically Signed   By: David  [place] M.D.   On: 02/28/2015 14:17   Dg Wrist Complete Right  02/28/2015   CLINICAL DATA:  Wrist pain.  No known injury.  Initial evaluation.  EXAM: RIGHT WRIST - COMPLETE 3+ VIEW  COMPARISON:  None.  FINDINGS: Diffuse osteopenia and degenerative change. No acute bony or joint abnormality identified. No evidence of fracture or dislocation.  IMPRESSION: Negative.   Electronically Signed   By: Maisie Fus  Register   On: 02/28/2015 14:18   US Venous Img Lower Unilateral Left  02/28/2015   CLINICAL DATA:  Left lower extremity swelling for 2 months.  EXAM: LEFT LOWER EXTREMITY VENOUS DOPPLER ULTRASOUND  TECHNIQUE: Gray-scale sonography with graded compression, as well as color Doppler and duplex ultrasound were performed to evaluate the lower extremity deep venous systems from the level of the common femoral vein and including the common femoral, femoral, profunda femoral, popliteal and calf veins including the posterior tibial, peroneal and gastrocnemius veins when visible. The superficial great saphenous vein was also interrogated. Spectral Doppler was utilized to evaluate flow at rest and with distal augmentation maneuvers in the common femoral, femoral and popliteal veins.  COMPARISON:  None.  FINDINGS: Contralateral Common Femoral Vein: Respiratory phasicity is normal and symmetric with the symptomatic side. No evidence of thrombus. Normal compressibility.  Common Femoral Vein: No evidence of thrombus. Normal compressibility, respiratory phasicity and response to augmentation.  Saphenofemoral Junction: No evidence of thrombus. Normal compressibility and flow on color Doppler imaging.  Profunda Femoral Vein: No evidence of thrombus. Normal compressibility and flow on color Doppler imaging.  Femoral Vein: No evidence of thrombus. Normal compressibility,  respiratory phasicity and response to augmentation.  Popliteal Vein: No evidence of thrombus. Normal compressibility, respiratory phasicity and response to augmentation.  Calf Veins: No evidence of thrombus. Normal compressibility and flow on color Doppler imaging.  Superficial Great Saphenous Vein: No evidence of thrombus. Normal compressibility and flow on color Doppler imaging.  Venous Reflux:  None.  Other Findings:  None.  IMPRESSION: No evidence of deep venous thrombosis.   Electronically Signed   By: Myles Rosenthal M.D.   On: 02/28/2015 17:11   Mm Digital Screening Bilateral  02/04/2015   CLINICAL DATA:  Screening.  EXAM: DIGITAL SCREENING BILATERAL MAMMOGRAM WITH CAD  COMPARISON:  Previous exam(s).  ACR Breast Density Category c: The breast tissue is heterogeneously dense, which may obscure small masses.  FINDINGS: There are no findings suspicious for malignancy. Images were processed with CAD.  IMPRESSION: No mammographic evidence  of malignancy. A result letter of this screening mammogram will be mailed directly to the patient.  RECOMMENDATION: Screening mammogram in one year. (Code:SM-B-01Y)  BI-RADS CATEGORY  1: Negative.   Electronically Signed   By: Dalphine Handing M.D.   On: 02/04/2015 18:49    PHYSICAL EXAM General: NAD Neck: JVP 7-8 cm, no thyromegaly or thyroid nodule.  Lungs: Clear to auscultation bilaterally with normal respiratory effort. CV: Nondisplaced PMI.  Heart regular S1/S2, no S3/S4, no murmur.  No edema.  No carotid bruit.  Normal pedal pulses.  Abdomen: Soft, nontender, no hepatosplenomegaly, no distention.  Neurologic: Alert and oriented x 3.  Psych: Normal affect. Extremities: No clubbing or cyanosis.   TELEMETRY: Reviewed telemetry pt in atrial fibrillation, controlled rate  ASSESSMENT AND PLAN: 79 yo with history of permanent atrial fibrillation, sinus node dysfunction s/p MDT PPM, Takotsubo CMP (resolved, 9/07), idiopathic eosinophilia, bronchiectasis with mild  obstructive PFTs, CAD with moderate LAD and RCA disease by cath in 2012, diastolic CHF and suspect PAH was admitted with edema, weight gain, progressive dyspnea.  She has been slowly worsening over the last few months.  She has been on 4 L home oxygen.  1. Acute on chronic diastolic CHF: EF 81-27% on 5/16 echo with PASP 66 mmHg.  I am concerned that pulmonary arterial hypertension is playing a large role in her presentation.  She remains short of breath with ambulation but weight is down and she does not appear to have much volume on exam. - Hold Lasix for now until RHC later today, will decide on what to do with diuretics going forward after RHC.   - Will plan RHC/LHC today.  Discussed risks/benefits again with patient and husband and they agree to proceed.  2. Pulmonary hypertension: I am concerned for PAH, possibly group 1 related to rheumatological disease.  RF was positive and ANA weakly positive in 5/16.  She has a history of bronchiectasis with mild obstructive PFTs.  CT chest in 3/15 did not show ILD but she is much more symptomatic now.  V/Q scan in 5/16 did not show acute or chronic PE.   - Needs rheum workup: ?RA => will send CCP antibody (re-order, does not appear to have been sent yesterday) and anti-dsDNA antibody.   - ?ILD related to rheumatological disease, ?RA => will need high resolution non-contrast chest CT when fully diuresed. Will order if filling pressures look ok on RHC.  - RHC today to assess PA pressure and filling pressures.  3. CAD: Moderate CAD on LHC in 2012.  Given exertional symptoms and known CAD, agree with Dr Royann Shivers that coronary angiography along with RHC is warranted.  Continue atorvastatin.  Not on ASA as she has been on warfarin.  4. Atrial fibrillation: Permanent it appears at this point.  HR not elevated, not on rate control meds.  Warfarin held for cath.  On heparin gtt for INR < 2, will hold prior to cath.   5. PPM: MDT, h/o SN dysfunction.  6. H/o Takotsubo  CMP: resolved.   Marca Ancona 03/05/2015 8:16 AM

## 2015-03-05 NOTE — Progress Notes (Signed)
Pt back from cath lab at 1540; VSS; telemetry reapplied and verified; foley remains intact; right TR band level 0 with 4cc remaining upon arrival to the unit; right groin site level 0 with clean, dry dsg; no active bleeding, bruising or hematoma noted. Per report pt bedrest for 2hr till 1710; pt RUE elevated on pillow; pt educated and both pt and family denies any questions. Will closely monitor pt. Arabella Merles Khori Rosevear RN.

## 2015-03-05 NOTE — Progress Notes (Signed)
Pt transported off unit to cath lab for a procedure. Pt heparin drip stopped per order and protocol. Arabella Merles Guillermo Difrancesco RN.

## 2015-03-05 NOTE — Progress Notes (Signed)
ANTICOAGULATION CONSULT NOTE   Pharmacy Consult for Heparin Indication: atrial fibrillation  Allergies  Allergen Reactions  . Benzoyl Peroxide Swelling    Swelling at the site of use  . Neomycin-Bacitracin Zn-Polymyx Swelling    Swelling at the site of use    Patient Measurements: Height: 4\' 11"  (149.9 cm) Weight: 121 lb 1.9 oz (54.939 kg) (Scale A) IBW/kg (Calculated) : 43.2   Vital Signs: Temp: 97.5 F (36.4 C) (07/13 1110) Temp Source: Oral (07/13 1110) BP: 103/60 mmHg (07/13 1420) Pulse Rate: 69 (07/13 1420)  Labs:  Recent Labs  03/03/15 0341 03/04/15 0305 03/04/15 1535 03/05/15 0258  HGB  --   --   --  15.6*  HCT  --   --   --  46.9*  PLT  --   --   --  371  LABPROT 25.9* 29.0* 23.3* 18.8*  INR 2.40* 2.79* 2.09* 1.57*  CREATININE 0.71 0.71  --  0.74    Estimated Creatinine Clearance: 44.5 mL/min (by C-G formula based on Cr of 0.74).   Assessment: 38 YOF admitted on 02/28/15 with acute on chronic respiratory failure/cellulitis, takes warfarin PTA (Warfarin 2.5 mg on Tues/Thurs/Sun, 1.25 mg all other days from anticoag outpatient notes).  Spoke with pt about home dose and is taking 2.5 mg T/Th, 1.25 mg AOD. -Coumadin on hold for cath. Reversed with vit K 1mg  IV yesterday. INR now down to 1.57.  Heparin started for planned cath today.   CBC stable.  S/p cath restart warfarin will give boost s/p Vit K.  Goal of Therapy:  Heparin level 0.3-0.7 units/ml Monitor platelets by anticoagulation protocol: Yes   Plan:   Coumadin 4mg  x1 today Daily Protime  Leota Sauers Pharm.D. CPP, BCPS Clinical Pharmacist [phone] 03/05/2015 2:44 PM

## 2015-03-05 NOTE — Progress Notes (Signed)
[facility] PROGRESS NOTE Interim History: 80yo woman with PMH of OA, Arib, pulmonary HTN, GERD, CHF, depression who presents due to increased fatigue and SOB. She had a TTE last year which showed a PA pressure of 66 making pulmonary HTN likely. She is on home O2 for chronic respiratory failure. Apparently, over the past few days she has not wanted to take her lasix b/c she did not want to have to go to the bathroom so much. She presented to her PCP today and was noted to have pOx of 89% on her home O2 of 4L. She was also noted to only be able to walk about 10 feet when she needed to stop and rest due to her SOB.  Filed Weights   03/03/15 0655 03/04/15 0635 03/05/15 0532  Weight: 56.983 kg (125 lb 10 oz) 55.3 kg (121 lb 14.6 oz) 54.939 kg (121 lb 1.9 oz)        Intake/Output Summary (Last 24 hours) at 03/05/15 2101 Last data filed at 03/05/15 1900  Gross per 24 hour  Intake  415.1 ml  Output  475.5 ml  Net  -60.4 ml     Assessment/Plan: Acute on chronic respiratory failure: multifactorial due to  Acute on chronic diastolic heart failure and pulmonary hypertension: - Continue oral lasix and will follow cardiology rec's for outpatient dosage - will monitor electrolytes. Bp tolerating and renal function tolerating diuresis  - not on Beta blockers. Tolerating ACE-I - On nasal canula for O2 supplementation; breathing improved . - consulted pulmonary recommended L& R cath, will follow results and further rec's - Appriciate cards and pulmonary assistance.  Cellulitis of left lower extremity - Started on IV vancomycin on 7.8.2016, afebrile, erythema essentially resolved - continue doxycycline as planned to finish therapy  Pulmonary hypertension: - Consult pulmonary who recommended cardiology For Right and left cath -will follow rec's -continue O2 supplementation  -no signs of fluid overload on exam currently  Atrial fibrillation-persistent - Rate controlled.    -currently on IV heparin for INR < 2 in process of heart cath today -will resume coumadin whn ok with pulmonology service    Code Status: full Family Communication: husband and son at bedside Disposition Plan: possible home in 2-3 days, if stable.   Consultants:  Pulmonary service  Cardiology   Procedures: ECHO: 12/25/14 - Left ventricle: The cavity size was normal. Wall thickness was normal. Systolic function was normal. The estimated ejection fraction was in the range of 55% to 60%. Wall motion was normal; there were no regional wall motion abnormalities. - Left atrium: The atrium was mildly dilated. - Right atrium: The atrium was mildly dilated. - Pulmonary arteries: Systolic pressure was moderately increased. PA peak pressure: 66 mm Hg (S).  Cath (right and left heart cath): plan for today 7/13  Antibiotics:  vanc-> doxy cont until 7.15.2016  HPI/Subjective: No fever, no N/V or abd pain, patient reports LE swelling and erythema essentially resolved. Denies CP and endorses breathing is better.  Objective: Filed Vitals:   03/05/15 1701 03/05/15 1730 03/05/15 2042 03/05/15 2057  BP: 102/63 112/63  103/62  Pulse: 72 73 69 73  Temp:    97.9 F (36.6 C)  TempSrc:    Oral  Resp: 22 22 18 20   Height:      Weight:      SpO2: 93% 94% 94% 93%     Exam:  General: Alert, awake, oriented x3, in no acute distress. Patient with O2 supplement and reporting breathing  is stable. No CP. No fever HEENT: No bruits, no goiter. - JVD Heart: Regular rate and rhythm. Lungs: Good air movement, crackles bilaterally Abdomen: Soft, nontender, nondistended, positive bowel sounds.  Neuro: Grossly intact, nonfocal. Extremities: LLE cellulitis essentially resolved. Trace edema bilaterally, no cyanosis   Data Reviewed: Basic Metabolic Panel:  Recent Labs Lab 02/28/15 1354 03/01/15 0604 03/03/15 0341 03/04/15 0305 03/05/15 0258  NA 131* 132* 131* 130* 129*  K 4.3 3.4*  4.3 3.7 4.4  CL 100* 96* 94* 94* 95*  CO2 GLUCOSE 107* 87 120* 102* 120*  BUN 23* 25*  CREATININE 0.65 0.78 0.71 0.71 0.74  CALCIUM 8.9 8.6* 8.6* 8.6* 8.5*   Liver Function Tests:  Recent Labs Lab 02/28/15 1354 03/05/15 0258  AST 80* 93*  ALT 68* 83*  ALKPHOS 120 123  BILITOT 0.9 0.8  PROT 7.1 7.3  ALBUMIN 3.4* 2.9*   CBC:  Recent Labs Lab 02/28/15 1354 03/01/15 0604 03/05/15 0258  WBC 8.4 8.6 11.2*  HGB 13.8 14.4 15.6*  HCT 41.5 43.0 46.9*  MCV 86.8 86.7 86.1  PLT 332 293 371   Cardiac Enzymes:  Recent Labs Lab 02/28/15 1354 02/28/15 2005 03/01/15 0108 03/01/15 0604 03/01/15 1350  TROPONINI 0.08* 0.12* 0.10* 0.10* 0.09*   BNP (last 3 results)  Recent Labs  02/28/15 1354 03/01/15 0108  BNP 393.9* 491.9*    CBG: No results for input(s): GLUCAP in the last 168 hours.  Recent Results (from the past 240 hour(s))  MRSA PCR Screening     Status: None   Collection Time: 02/28/15 11:22 PM  Result Value Ref Range Status   MRSA by PCR NEGATIVE NEGATIVE Final    Comment:        The GeneXpert MRSA Assay (FDA approved for NASAL specimens only), is one component of a comprehensive MRSA colonization surveillance program. It is not intended to diagnose MRSA infection nor to guide or monitor treatment for MRSA infections.      Studies: No results found.  Scheduled Meds: . antiseptic oral rinse  7 mL Mouth Rinse q12n4p  . atorvastatin  10 mg Oral q1800  . budesonide (PULMICORT) nebulizer solution  0.25 mg Nebulization BID  . chlorhexidine  15 mL Mouth Rinse BID  . doxycycline  100 mg Oral BID  . escitalopram  10 mg Oral Daily  . feeding supplement (PRO-STAT SUGAR FREE 64)  30 mL Oral BID  . fluticasone  2 spray Each Nare Daily  . lisinopril  2.5 mg Oral Daily  . loratadine  10 mg Oral Daily  . pantoprazole  80 mg Oral Q1200  . sildenafil  20 mg Oral TID  . sodium chloride  3 mL Intravenous Q12H  . sodium chloride  3 mL  Intravenous Q12H  . tiotropium  18 mcg Inhalation Daily  . traZODone  200 mg Oral QHS  . Warfarin - Pharmacist Dosing Inpatient   Does not apply q1800   Continuous Infusions: . sodium chloride 10 mL/hr at 03/05/15 0554    Time: 30 minutes  Vassie Loll  [facility] Pager [phone] If 7PM-7AM, please contact night-coverage at [email or web address], password [facility] 03/05/2015, 9:01 PM  LOS: 5 days

## 2015-03-06 ENCOUNTER — Encounter (HOSPITAL_COMMUNITY): Payer: Self-pay | Admitting: *Deleted

## 2015-03-06 ENCOUNTER — Inpatient Hospital Stay (HOSPITAL_COMMUNITY): Payer: Commercial Managed Care - HMO

## 2015-03-06 LAB — PROTIME-INR
INR: 1.47 (ref 0.00–1.49)
Prothrombin Time: 17.9 seconds — ABNORMAL HIGH (ref 11.6–15.2)

## 2015-03-06 LAB — BASIC METABOLIC PANEL
Anion gap: 7 (ref 5–15)
BUN: 22 mg/dL — ABNORMAL HIGH (ref 6–20)
CO2: 25 mmol/L (ref 22–32)
CREATININE: 0.6 mg/dL (ref 0.44–1.00)
Calcium: 8.3 mg/dL — ABNORMAL LOW (ref 8.9–10.3)
Chloride: 101 mmol/L (ref 101–111)
GFR calc Af Amer: >60 mL/min (ref >60)
GFR calc non Af Amer: >60 mL/min (ref >60)
Glucose, Bld: 103 mg/dL — ABNORMAL HIGH (ref 65–99)
POTASSIUM: 4 mmol/L (ref 3.5–5.1)
SODIUM: 133 mmol/L — AB (ref 135–145)

## 2015-03-06 LAB — CBC
HEMATOCRIT: 42.6 % (ref 36.0–46.0)
Hemoglobin: 14 g/dL (ref 12.0–15.0)
MCH: 28.6 pg (ref 26.0–34.0)
MCHC: 32.9 g/dL (ref 30.0–36.0)
MCV: 86.9 fL (ref 78.0–100.0)
Platelets: 336 10*3/uL (ref 150–400)
RBC: 4.9 MIL/uL (ref 3.87–5.11)
RDW: 15.3 % (ref 11.5–15.5)
WBC: 8.1 10*3/uL (ref 4.0–10.5)

## 2015-03-06 MED ORDER — PANTOPRAZOLE SODIUM 40 MG PO PACK
80.0000 mg | PACK | Freq: Every day | ORAL | Status: DC
  Administered 2015-03-06 – 2015-03-08 (×3): 80 mg
  Filled 2015-03-06 (×3): qty 40

## 2015-03-06 MED ORDER — WARFARIN SODIUM 2.5 MG PO TABS
2.5000 mg | ORAL_TABLET | Freq: Once | ORAL | Status: AC
  Administered 2015-03-06: 2.5 mg via ORAL
  Filled 2015-03-06: qty 1

## 2015-03-06 MED ORDER — TADALAFIL (PAH) 20 MG PO TABS
20.0000 mg | ORAL_TABLET | Freq: Every day | ORAL | Status: DC
  Administered 2015-03-06 – 2015-03-08 (×3): 20 mg via ORAL
  Filled 2015-03-06 (×4): qty 1

## 2015-03-06 MED ORDER — WARFARIN SODIUM 4 MG PO TABS
4.0000 mg | ORAL_TABLET | Freq: Once | ORAL | Status: DC
  Filled 2015-03-06: qty 1

## 2015-03-06 MED FILL — Heparin Sodium (Porcine) 2 Unit/ML in Sodium Chloride 0.9%: INTRAMUSCULAR | Qty: 1000 | Status: AC

## 2015-03-06 MED FILL — Lidocaine HCl Local Preservative Free (PF) Inj 1%: INTRAMUSCULAR | Qty: 30 | Status: AC

## 2015-03-06 NOTE — Progress Notes (Signed)
[facility] PROGRESS NOTE Interim History: 80yo woman with PMH of OA, Arib, pulmonary HTN, GERD, CHF, depression who presents due to increased fatigue and SOB. She had a TTE last year which showed a PA pressure of 66 making pulmonary HTN likely. She is on home O2 for chronic respiratory failure. Apparently, over the past few days she has not wanted to take her lasix b/c she did not want to have to go to the bathroom so much. She presented to her PCP today and was noted to have pOx of 89% on her home O2 of 4L. She was also noted to only be able to walk about 10 feet when she needed to stop and rest due to her SOB.  Filed Weights   03/04/15 0635 03/05/15 0532 03/06/15 0511  Weight: 55.3 kg (121 lb 14.6 oz) 54.939 kg (121 lb 1.9 oz) 56.337 kg (124 lb 3.2 oz)        Intake/Output Summary (Last 24 hours) at 03/06/15 1757 Last data filed at 03/06/15 1500  Gross per 24 hour  Intake 1195.1 ml  Output    850 ml  Net  345.1 ml     Assessment/Plan: Acute on chronic respiratory failure: multifactorial due to  Acute on chronic diastolic heart failure and pulmonary hypertension: -oral lasix to be decided by cardiology rec's - will monitor electrolytes.  - not on Beta blockers. Tolerating ACE-I - On nasal canula for O2 supplementation; breathing stable. - S/p R/L cath -plan is to treat pulmonary HTN - Appreciate cards and pulmonary assistance.  Cellulitis of left lower extremity - Started on IV vancomycin on 7.8.2016, afebrile, erythema essentially resolved - continue doxycycline as planned to finish therapy  Pulmonary hypertension: -plan base on results of her cath is to start tadalafil  -will follow rec's -continue O2 supplementation  -no signs of fluid overload on exam currently  Atrial fibrillation-persistent - Rate controlled.  -currently on IV heparin for INR < 2 in process of heart cath today -will resume coumadin today per pharmacy  Physical deconditioning -per  PT/OT rec's will benefit of SNF for rehab -family in agreement -CSW involved and bed search active    Code Status: full Family Communication: husband and son at bedside Disposition Plan: SNF for rehabilitation; once ok with pulmonary service and cardiology service   Consultants:  Pulmonary service  Cardiology   Procedures: ECHO: 12/25/14 - Left ventricle: The cavity size was normal. Wall thickness was normal. Systolic function was normal. The estimated ejection fraction was in the range of 55% to 60%. Wall motion was normal; there were no regional wall motion abnormalities. - Left atrium: The atrium was mildly dilated. - Right atrium: The atrium was mildly dilated. - Pulmonary arteries: Systolic pressure was moderately increased. PA peak pressure: 66 mm Hg (S).  Cath (right and left heart cath): plan for today 7/13  Antibiotics:  vanc-> doxy cont until 7.15.2016  HPI/Subjective: No fever, no N/V or abd pain, patient reports LE swelling and erythema essentially resolved. Denies CP and endorses breathing is stable. Sitting on chair and tolerating fine.  Objective: Filed Vitals:   03/06/15 0511 03/06/15 1005 03/06/15 1059 03/06/15 1348  BP: 114/63  121/51 106/56  Pulse: 79  75 79  Temp: 98.4 F (36.9 C)  97.8 F (36.6 C) 97.5 F (36.4 C)  TempSrc: Oral  Oral Oral  Resp: 18     Height:      Weight: 56.337 kg (124 lb 3.2 oz)     SpO2:  94% 93% 93% 94%     Exam:  General: Alert, awake, oriented x3, in no acute distress. Patient with O2 supplement and reporting breathing is stable. No CP. No fever. Weak and with very limited exercise capacity  HEENT: No bruits, no goiter. - JVD Heart: Regular rate and rhythm. Lungs: Good air movement, no frank crackles on exam Abdomen: Soft, nontender, nondistended, positive bowel sounds.  Neuro: Grossly intact, nonfocal. Extremities: LLE cellulitis essentially resolved. Trace edema bilaterally, no cyanosis   Data  Reviewed: Basic Metabolic Panel:  Recent Labs Lab 03/01/15 0604 03/03/15 0341 03/04/15 0305 03/05/15 0258 03/06/15 0416  NA 132* 131* 130* 129* 133*  K 3.4* 4.3 3.7 4.4 4.0  CL 96* 94* 94* 95* 101  CO2 GLUCOSE 87 120* 102* 120* 103*  BUN 8 15 23* 25* 22*  CREATININE 0.78 0.71 0.71 0.74 0.60  CALCIUM 8.6* 8.6* 8.6* 8.5* 8.3*   Liver Function Tests:  Recent Labs Lab 02/28/15 1354 03/05/15 0258  AST 80* 93*  ALT 68* 83*  ALKPHOS 120 123  BILITOT 0.9 0.8  PROT 7.1 7.3  ALBUMIN 3.4* 2.9*   CBC:  Recent Labs Lab 02/28/15 1354 03/01/15 0604 03/05/15 0258 03/06/15 0416  WBC 8.4 8.6 11.2* 8.1  HGB 13.8 14.4 15.6* 14.0  HCT 41.5 43.0 46.9* 42.6  MCV 86.8 86.7 86.1 86.9  PLT 332 293 371 336   Cardiac Enzymes:  Recent Labs Lab 02/28/15 1354 02/28/15 2005 03/01/15 0108 03/01/15 0604 03/01/15 1350  TROPONINI 0.08* 0.12* 0.10* 0.10* 0.09*   BNP (last 3 results)  Recent Labs  02/28/15 1354 03/01/15 0108  BNP 393.9* 491.9*    Recent Results (from the past 240 hour(s))  MRSA PCR Screening     Status: None   Collection Time: 02/28/15 11:22 PM  Result Value Ref Range Status   MRSA by PCR NEGATIVE NEGATIVE Final    Comment:        The GeneXpert MRSA Assay (FDA approved for NASAL specimens only), is one component of a comprehensive MRSA colonization surveillance program. It is not intended to diagnose MRSA infection nor to guide or monitor treatment for MRSA infections.      Studies: Ct Chest High Resolution  03/06/2015   CLINICAL DATA:  80 year old female with increasing shortness of breath over the past year. Clinical concern for interstitial lung disease. History of asthma. Currently on home oxygen.  EXAM: CT CHEST WITHOUT CONTRAST  TECHNIQUE: Multidetector CT imaging of the chest was performed following the standard protocol without intravenous contrast. High resolution imaging of the lungs, as well as inspiratory and expiratory  imaging, was performed.  COMPARISON:  Chest CT 11/13/2013.  FINDINGS: Mediastinum/Lymph Nodes: Heart size is mildly enlarged. There is no significant pericardial fluid, thickening or pericardial calcification. There is atherosclerosis of the thoracic aorta, the great vessels of the mediastinum and the coronary arteries, including calcified atherosclerotic plaque in the left main, left anterior descending, left circumflex and right coronary arteries. Mild calcifications of the aortic valve. Left-sided pacemaker device in position with lead tips terminating in the right atrium and the right ventricle near the apex. Multiple prominent borderline enlarged mediastinal and hilar lymph nodes are noted. Esophagus is unremarkable in appearance. No axillary lymphadenopathy.  Lungs/Pleura: High-resolution images demonstrate extensive patchy areas of ground-glass attenuation asymmetrically distributed in the lungs bilaterally (left greater than right), typically in a peribronchovascular distribution. This is associated with extensive thickening of the adjacent peribronchovascular interstitium, as well as septal  thickening and some subpleural reticulation. In addition, there is a more peripherally located area of consolidation and airspace disease in the left upper lobe which is somewhat wedge-shaped. Scattered areas of mild cylindrical bronchiectasis are noted throughout these same regions. No frank honeycombing is confidently identified at this time. No clearly definable craniocaudal gradient, although there is more basilar involvement at this time. No large suspicious appearing pulmonary nodules or masses. Inspiratory and expiratory imaging demonstrates some mild air trapping, indicative of mild small airways disease. No pleural effusions.  Upper Abdomen: Mild diffuse decreased attenuation throughout the hepatic parenchyma, indicative of mild hepatic steatosis. Atherosclerosis.  Musculoskeletal/Soft Tissues: There are no  aggressive appearing lytic or blastic lesions noted in the visualized portions of the skeleton.  IMPRESSION: 1. Unusual appearance of the lung parenchyma, as discussed above. These findings could certainly reflect an underlying interstitial lung disease, but the pattern is nonspecific. At this time, findings are favored to reflect nonspecific interstitial pneumonia (NSIP), or potentially cryptogenic organizing pneumonia (COP). No definite evidence to suggest more aggressive process such as usual interstitial pneumonia (UIP) at this time. 2. The possibility of concurrent or residual infectious airspace consolidation is not excluded, particularly in the periphery of the left upper lobe. Clinical correlation for signs and symptoms of pneumonia is recommended. 3. For further evaluation of these findings, a repeat high-resolution chest CT is suggested in 6-12 months to assess for temporal changes in the appearance of the lung parenchyma if clinically appropriate. 4. Atherosclerosis, including left main and 3 vessel coronary artery disease. Assessment for potential risk factor modification, dietary therapy or pharmacologic therapy may be warranted, if clinically indicated. 5. Mild hepatic steatosis. 6. Additional incidental findings, as above.   Electronically Signed   By: Trudie Reed M.D.   On: 03/06/2015 14:11    Scheduled Meds: . antiseptic oral rinse  7 mL Mouth Rinse q12n4p  . atorvastatin  10 mg Oral q1800  . budesonide (PULMICORT) nebulizer solution  0.25 mg Nebulization BID  . chlorhexidine  15 mL Mouth Rinse BID  . doxycycline  100 mg Oral BID  . escitalopram  10 mg Oral Daily  . feeding supplement (PRO-STAT SUGAR FREE 64)  30 mL Oral BID  . fluticasone  2 spray Each Nare Daily  . lisinopril  2.5 mg Oral Daily  . loratadine  10 mg Oral Daily  . pantoprazole sodium  80 mg Per Tube Daily  . sodium chloride  3 mL Intravenous Q12H  . sodium chloride  3 mL Intravenous Q12H  . Tadalafil (PAH)  20  mg Oral Daily  . tiotropium  18 mcg Inhalation Daily  . traZODone  200 mg Oral QHS  . Warfarin - Pharmacist Dosing Inpatient   Does not apply q1800   Continuous Infusions: . sodium chloride Stopped (03/05/15 1900)    Time: 30 minutes  Vassie Loll  [facility] Pager [phone] If 7PM-7AM, please contact night-coverage at [email or web address], password [facility] 03/06/2015, 5:57 PM  LOS: 6 days

## 2015-03-06 NOTE — Progress Notes (Signed)
CSW Proofreader) spoke with pt husband at bedside and provided bed offers. Pt husband would like to accept bed at [facility]. CSW left voicemail for facility to notify. CSW notified insurance representative.  Joy Haegele, LCSWA [phone]

## 2015-03-06 NOTE — Progress Notes (Signed)
ANTICOAGULATION CONSULT NOTE   Pharmacy Consult for Heparin Indication: atrial fibrillation  Allergies  Allergen Reactions  . Benzoyl Peroxide Swelling    Swelling at the site of use  . Neomycin-Bacitracin Zn-Polymyx Swelling    Swelling at the site of use    Patient Measurements: Height: 4\' 11"  (149.9 cm) Weight: 124 lb 3.2 oz (56.337 kg) (scale a) IBW/kg (Calculated) : 43.2   Vital Signs: Temp: 98.4 F (36.9 C) (07/14 0511) Temp Source: Oral (07/14 0511) BP: 114/63 mmHg (07/14 0511) Pulse Rate: 79 (07/14 0511)  Labs:  Recent Labs  03/04/15 0305 03/04/15 1535 03/05/15 0258 03/06/15 0416  HGB  --   --  15.6* 14.0  HCT  --   --  46.9* 42.6  PLT  --   --  371 336  LABPROT 29.0* 23.3* 18.8* 17.9*  INR 2.79* 2.09* 1.57* 1.47  CREATININE 0.71  --  0.74 0.60    Estimated Creatinine Clearance: 45 mL/min (by C-G formula based on Cr of 0.6).   Assessment: 33 YOF admitted on 02/28/15 with acute on chronic respiratory failure/cellulitis, takes warfarin PTA (Warfarin 2.5 mg on Tues/Thurs/Sun, 1.25 mg all other days from anticoag outpatient notes).  Spoke with pt about home dose and is taking 2.5 mg T/Th, 1.25 mg AOD. -Coumadin on hold for cath. Reversed with vit K 1mg  IV yesterday. INR now down to 1.47  S/p cath restart warfarin  Gave  Boost 4mg  x1 since s/p Vit K.  Goal of Therapy:  INR 2-3 Monitor platelets by anticoagulation protocol: Yes   Plan:   Coumadin 2.5mg  x1 today Daily Protime  Leota Sauers Pharm.D. CPP, BCPS Clinical Pharmacist [phone] 03/06/2015 8:54 AM

## 2015-03-06 NOTE — Progress Notes (Signed)
Benefit check for Tadalafil 20mg  everyday  RE: Benefit check      Yvonna Alanis CMA           [facility] at [phone]. Talked to CSR Wood E (Ref # [id number]). According to [place] TADALAFIL (CIALIS) is not covered by patient's plan. Patient would have to pay for the entire cost of this medication, which has an estimated starting cost of $ 52.71. Patient can obtain a 90 Day supply at the [facility] for $156.53.     Abelino Derrick [facility] [phone]

## 2015-03-06 NOTE — Progress Notes (Signed)
Name: [name] MRN: [id number] DOB: 06/21/1935    ADMISSION DATE:  02/28/2015 CONSULTATION DATE:  03/06/2015  REFERRING MD :  David Stall  CHIEF COMPLAINT:  SOB  BRIEF PATIENT DESCRIPTION: 80 y.o. woman followed by Dr Delton Coombes long term for mild obstructive lung disease, bronchiectasis and cough. 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 She is followed at [facility] cardiology for diastolic CHF and pacemaker placement for ablated A Flutter.   Over the last 12 months she has developed progressive exertional dyspnea and hypoxemia, ultimately requiring initiation of home O2. Her evaluation to date has been significant for PAH, TTE 12/25/14 showed LEVEF 55 - 60%, mild bilateral atrial enlargement, PASP 66 mmHg (increased from 38 mmHg 2 years prior). CT chest has not shown ILD (11/13/13), V/q without evidence chronic VTE (12/2014). Her RF and ANA are both elevated.   She was admitted with progressive dyspnea, hypoxemia, some mild increase in B interstitial infiltrates after she stopped taking her home lasix for a few days.   SIGNIFICANT EVENTS  7/8 - admitted. 7/10 - PCCM consulted.  STUDIES:  CXR 7/8 >>> reactive airway dz with superimposed CHF and mild edema. L and R heart cath 7/13 >> PAP 52/19 (31), PAOP (6), PVR 8-9, RVP 43/1  SUBJECTIVE:  Up to chair and a bit stronger  VITAL SIGNS: Temp:  [97.5 F (36.4 C)-98.4 F (36.9 C)] 98.4 F (36.9 C) (07/14 0511) Pulse Rate:  [0-295] 79 (07/14 0511) Resp:  [0-95] 18 (07/14 0511) BP: (94-129)/(48-86) 114/63 mmHg (07/14 0511) SpO2:  [0 %-98 %] 93 % (07/14 1005) Weight:  [56.337 kg (124 lb 3.2 oz)] 56.337 kg (124 lb 3.2 oz) (07/14 0511)  PHYSICAL EXAMINATION: General: Chronically ill appearing female, resting in bed, in NAD. Neuro: A&O x 3, non-focal.  HEENT: [place]/AT. PERRL, sclerae anicteric. 4L O2 via [place] in place. Cardiovascular: IRIR, no M/R/G.  Lungs: Respirations even and unlabored.  Faint  basilar crackles. Abdomen: BS x 4, soft, NT/ND.  Musculoskeletal: No gross deformities, no edema.  Skin: Intact, warm, no rashes.    Recent Labs Lab 03/04/15 0305 03/05/15 0258 03/06/15 0416  NA 130* 129* 133*  K 3.7 4.4 4.0  CL 94* 95* 101  CO2 BUN 23* 25* 22*  CREATININE 0.71 0.74 0.60  GLUCOSE 102* 120* 103*    Recent Labs Lab 03/01/15 0604 03/05/15 0258 03/06/15 0416  HGB 14.4 15.6* 14.0  HCT 43.0 46.9* 42.6  WBC 8.6 11.2* 8.1  PLT 293 371 336   No results found.  ASSESSMENT / PLAN:  Acute on chronic hypoxic respiratory failure - progressive over 12 months PAH (PASP 66 by echo 12/25/14) - severe  -- Suspect that this is primarily Group 1 PAH, a manifestation of newly revealed connective tissue disease. Consider also any contribution of underlying lung disease (Group 3) but no significant ILD by CT or CXR to date. Her cardiac disease and her asthma have both been stable for years and have not appeared to change as her PAH has evolved. PAOP from 7/13 does not support L heart disease as a large contributor  Consider ? ILD in setting suspected connective tissue disease. Doubt a significant contributor here Hx bronchiectasis and mild asthma Recs:  Continue supplemental O2 as needed to maintain SpO2 > 90%. Continue her home spiriva, pulmicort as a formulary substitution for QVAR Agree with start tadalafil - appreciate Pharmacy efforts to get this. Agree with starting ambrisentan as an  outpt once stable on the first med.  Agree with High res Ct chest. Doubt that ILD a contributor here based on her prior imaging but I would like to establish her baseline in setting pulmonary rheumatoid disease.   Diastolic CHF, well controlled. Atrial fib / flutter, ablated and paced. Recs:  Probably slightly over-diuresed on admission. Will need a low dose maintenance lasix dose for discharge Restart warfarin    Levy Pupa, MD, PhD 03/06/2015, 10:27 AM [facility] [phone] or if no answer [phone]

## 2015-03-06 NOTE — Care Management Important Message (Signed)
Important Message  Patient Details  Name: [name] MRN: [id number] Date of Birth: [birth year]   Medicare Important Message Given:  Yes-third notification given    Yvonna Alanis 03/06/2015, 2:31 PM

## 2015-03-06 NOTE — Progress Notes (Signed)
Patient ID: [name], female   DOB: [birth year], 80 y.o.   MRN: [id number]   80 yo with history of permanent atrial fibrillation, sinus node dysfunction s/p MDT PPM, Takotsubo CMP (resolved, 9/07), idiopathic eosinophilia, bronchiectasis with mild obstructive PFTs, CAD with moderate LAD and RCA disease by cath in 2012, diastolic CHF and suspect PAH was admitted with edema, weight gain, progressive dyspnea.  She has been slowly worsening over the last few months.  She has been on 4 L home oxygen.   This admission, she was diuresed with IV Lasix.  Last echo in 5/16 showed EF 55-60%, PA systolic pressure 66 mmHg, RV apparently normal (will need to review).  She was seen by Dr Royann Shivers who recommended right and left heart cath (workup pulmonary hypertension and also with history of CAD).   Filling pressures were very low on RHC yesterday, gave some fluid back.  Comfortable at rest this morning, still dyspneic with ambulation.  Significant PAH with PVR around 9.   RHC/LHC (7/13):  Hemodynamics (mmHg) RA mean 1 RV 43/1 PA 52/19, mean 31 PCWP mean 6 LV 94/1 AO 106/61 Cardiac Output (Fick) 3.11  Cardiac Index (Fick) 2.09 PVR 8 WU Cardiac Output (Thermo) 2.74 Cardiac Index (Thermo) 1.84  PVR 9.1 WU Left Main  Short, no significant disease.      Left Anterior Descending  50% napkin ring-like stenosis in the proximal LAD. 50% ostial D2 (moderate vessel).     Left Circumflex  Luminal irregularities.     Right Coronary Artery  Small, co-dominant vessel with 60% stenosis in the mid-portion (similar to prior study).      Scheduled Meds: . antiseptic oral rinse  7 mL Mouth Rinse q12n4p  . atorvastatin  10 mg Oral q1800  . budesonide (PULMICORT) nebulizer solution  0.25 mg Nebulization BID  . chlorhexidine  15 mL Mouth Rinse BID  . doxycycline  100 mg Oral BID  . escitalopram  10 mg Oral Daily  . feeding supplement (PRO-STAT SUGAR FREE 64)  30 mL Oral BID  . fluticasone  2 spray Each  Nare Daily  . lisinopril  2.5 mg Oral Daily  . loratadine  10 mg Oral Daily  . pantoprazole  80 mg Oral Q1200  . sodium chloride  3 mL Intravenous Q12H  . sodium chloride  3 mL Intravenous Q12H  . tiotropium  18 mcg Inhalation Daily  . traZODone  200 mg Oral QHS  . Warfarin - Pharmacist Dosing Inpatient   Does not apply q1800   Continuous Infusions: . sodium chloride Stopped (03/05/15 1900)   PRN Meds:.sodium chloride, sodium chloride, acetaminophen, albuterol, ondansetron (ZOFRAN) IV, sodium chloride, sodium chloride   Filed Vitals:   03/05/15 1730 03/05/15 2042 03/05/15 2057 03/06/15 0511  BP: 112/63  103/62 114/63  Pulse: 73 69 73 79  Temp:   97.9 F (36.6 C) 98.4 F (36.9 C)  TempSrc:   Oral Oral  Resp: Height:      Weight:    124 lb 3.2 oz (56.337 kg)  SpO2: 94% 94% 93% 94%    Intake/Output Summary (Last 24 hours) at 03/06/15 0812 Last data filed at 03/06/15 0645  Gross per 24 hour  Intake  655.1 ml  Output    725 ml  Net  -69.9 ml    LABS: Basic Metabolic Panel:  Recent Labs  95/62/13 0258 03/06/15 0416  NA 129* 133*  K 4.4 4.0  CL 95* 101  CO2 25  25  GLUCOSE 120* 103*  BUN 25* 22*  CREATININE 0.74 0.60  CALCIUM 8.5* 8.3*   Liver Function Tests:  Recent Labs  03/05/15 0258  AST 93*  ALT 83*  ALKPHOS 123  BILITOT 0.8  PROT 7.3  ALBUMIN 2.9*   No results for input(s): LIPASE, AMYLASE in the last 72 hours. CBC:  Recent Labs  03/05/15 0258 03/06/15 0416  WBC 11.2* 8.1  HGB 15.6* 14.0  HCT 46.9* 42.6  MCV 86.1 86.9  PLT 371 336   Cardiac Enzymes: No results for input(s): CKTOTAL, CKMB, CKMBINDEX, TROPONINI in the last 72 hours. BNP: Invalid input(s): POCBNP D-Dimer: No results for input(s): DDIMER in the last 72 hours. Hemoglobin A1C: No results for input(s): HGBA1C in the last 72 hours. Fasting Lipid Panel: No results for input(s): CHOL, HDL, LDLCALC, TRIG, CHOLHDL, LDLDIRECT in the last 72 hours. Thyroid Function  Tests: No results for input(s): TSH, T4TOTAL, T3FREE, THYROIDAB in the last 72 hours.  Invalid input(s): FREET3 Anemia Panel: No results for input(s): VITAMINB12, FOLATE, FERRITIN, TIBC, IRON, RETICCTPCT in the last 72 hours.  RADIOLOGY: Dg Chest 2 View  02/28/2015   CLINICAL DATA:  Shortness of breath and lower extremity pitting edema ; history of atrial fibrillation and asthma  EXAM: CHEST  2 VIEW  COMPARISON:  PA and lateral chest of Jan 17, 2015  FINDINGS: The lungs are mildly hyperinflated. The interstitial markings are increased. The pulmonary vascularity is engorged. The cardiac silhouette is mildly enlarged. A permanent pacemaker is in place with appropriate positioning of the electrodes. There is no pleural effusion. The bony thorax exhibits no acute abnormality.  IMPRESSION: Reactive airway disease with superimposed CHF and mild interstitial edema. There is no alveolar pneumonia.   Electronically Signed   By: David  [place] M.D.   On: 02/28/2015 14:17   Dg Wrist Complete Right  02/28/2015   CLINICAL DATA:  Wrist pain.  No known injury.  Initial evaluation.  EXAM: RIGHT WRIST - COMPLETE 3+ VIEW  COMPARISON:  None.  FINDINGS: Diffuse osteopenia and degenerative change. No acute bony or joint abnormality identified. No evidence of fracture or dislocation.  IMPRESSION: Negative.   Electronically Signed   By: Maisie Fus  Register   On: 02/28/2015 14:18   US Venous Img Lower Unilateral Left  02/28/2015   CLINICAL DATA:  Left lower extremity swelling for 2 months.  EXAM: LEFT LOWER EXTREMITY VENOUS DOPPLER ULTRASOUND  TECHNIQUE: Gray-scale sonography with graded compression, as well as color Doppler and duplex ultrasound were performed to evaluate the lower extremity deep venous systems from the level of the common femoral vein and including the common femoral, femoral, profunda femoral, popliteal and calf veins including the posterior tibial, peroneal and gastrocnemius veins when visible. The superficial  great saphenous vein was also interrogated. Spectral Doppler was utilized to evaluate flow at rest and with distal augmentation maneuvers in the common femoral, femoral and popliteal veins.  COMPARISON:  None.  FINDINGS: Contralateral Common Femoral Vein: Respiratory phasicity is normal and symmetric with the symptomatic side. No evidence of thrombus. Normal compressibility.  Common Femoral Vein: No evidence of thrombus. Normal compressibility, respiratory phasicity and response to augmentation.  Saphenofemoral Junction: No evidence of thrombus. Normal compressibility and flow on color Doppler imaging.  Profunda Femoral Vein: No evidence of thrombus. Normal compressibility and flow on color Doppler imaging.  Femoral Vein: No evidence of thrombus. Normal compressibility, respiratory phasicity and response to augmentation.  Popliteal Vein: No evidence of thrombus. Normal compressibility, respiratory phasicity  and response to augmentation.  Calf Veins: No evidence of thrombus. Normal compressibility and flow on color Doppler imaging.  Superficial Great Saphenous Vein: No evidence of thrombus. Normal compressibility and flow on color Doppler imaging.  Venous Reflux:  None.  Other Findings:  None.  IMPRESSION: No evidence of deep venous thrombosis.   Electronically Signed   By: Myles Rosenthal M.D.   On: 02/28/2015 17:11    PHYSICAL EXAM General: NAD Neck: JVP 7 cm, no thyromegaly or thyroid nodule.  Lungs: Clear to auscultation bilaterally with normal respiratory effort. CV: Nondisplaced PMI.  Heart regular S1/S2, no S3/S4, no murmur.  No edema.  No carotid bruit.  Normal pedal pulses.  Abdomen: Soft, nontender, no hepatosplenomegaly, no distention.  Neurologic: Alert and oriented x 3.  Psych: Normal affect. Extremities: No clubbing or cyanosis.   TELEMETRY: Reviewed telemetry pt in atrial fibrillation, controlled rate  ASSESSMENT AND PLAN: 80 yo with history of permanent atrial fibrillation, sinus node  dysfunction s/p MDT PPM, Takotsubo CMP (resolved, 9/07), idiopathic eosinophilia, bronchiectasis with mild obstructive PFTs, CAD with moderate LAD and RCA disease by cath in 2012, diastolic CHF and suspect PAH was admitted with edema, weight gain, progressive dyspnea.  She has been slowly worsening over the last few months.  She has been on 4 L home oxygen.  1. Acute on chronic diastolic CHF: EF 16-10% on 5/16 echo with PASP 66 mmHg.  I am concerned that pulmonary arterial hypertension is playing a large role in her presentation.  She remains short of breath with ambulation but weight is down and she does not appear to have much volume on exam.  Filling pressures low on RHC yesterday.  - No Lasix for now, will likely eventually need a low dose at home.   2. Pulmonary hypertension: Significant PAH with marginal cardiac output (albeit in setting of hypovolemia) on RHC, possibly group 1 related to rheumatological disease.  RF was positive and ANA weakly positive (with negative anti-dsDNA).  She has a history of bronchiectasis with mild obstructive PFTs.  CT chest in 3/15 did not show ILD but she is much more symptomatic now.  V/Q scan in 5/16 did not show acute or chronic PE.   - Needs rheum workup: ?RA => will send CCP antibody (re-order, does not appear to have been sent yesterday).   - ?ILD related to rheumatological disease, ?RA => will need high resolution non-contrast chest CT (has been ordered). I wonder if ILD does not explain some of her dyspnea - I will start her on tadalafil 20 daily (mild transaminitis so will start this first instead of ambrisentan).  Based on most recent trial data, would like to get her on ERA soon as well.  3. CAD: Moderate CAD on coronary angiography on 7/13.  Continue atorvastatin.  Not on ASA as she has been on warfarin.  4. Atrial fibrillation: Permanent it appears at this point.  HR not elevated, not on rate control meds.  Warfarin resumed. 5. PPM: MDT, h/o SN dysfunction.   6. H/o Takotsubo CMP: resolved.   Marca Ancona 03/06/2015 8:12 AM

## 2015-03-06 NOTE — Progress Notes (Signed)
Occupational Therapy Treatment Patient Details Name: [name] MRN: [id number] DOB: 1935-06-24 Today's Date: 03/06/2015    History of present illness 80yo woman with PMH of OA, Arib, pulmonary HTN, GERD, CHF, depression who presents due to increased fatigue and SOB. She had a TTE last year which showed a PA pressure of 66 making pulmonary HTN likely. She is on home O2 for chronic respiratory failure. Apparently, over the past few days she has not wanted to take her lasix b/c she did not want to have to go to the bathroom so much. She presented to her PCP today and was noted to have pOx of 89% on her home O2 of 4L. She was also noted to only be able to walk about 10 feet when she needed to stop and rest due to her SOB.   OT comments  Pt with improvement in bed mobility and able to ambulate 7 feet today with +2 assist for safety. Remained up in chair. Performed grooming in sitting, with vomiting upon toothbrushing. Pt with desaturation on 4.5L nasal canula to 85% requiring face mask on 50% and several minutes to recover to 91%. Family agreeable to ST rehab in SNF.    Follow Up Recommendations  SNF;Supervision/Assistance - 24 hour    Equipment Recommendations       Recommendations for Other Services      Precautions / Restrictions Precautions Precautions: Fall Precaution Comments: watch sats Restrictions Weight Bearing Restrictions: No       Mobility Bed Mobility Overal bed mobility: Needs Assistance Bed Mobility: Supine to Sit;Rolling Rolling: Modified independent (Device/Increase time)   Supine to sit: Supervision     General bed mobility comments: use of rail, verbal cues for sequence. Once pt sat up, pt on 4.5 LO2 O2 dropped to 85% therefore placed on 50% face mask with sats 91%.    Transfers Overall transfer level: Needs assistance Equipment used: Rolling walker (2 wheeled) Transfers: Sit to/from Stand Sit to Stand: Min assist;+2 safety/equipment          General transfer comment: verbal and tactile cues for hand placement and continued to pull up on the walker    Balance Overall balance assessment: Needs assistance;History of Falls Sitting-balance support: No upper extremity supported;Feet supported Sitting balance-Leahy Scale: Fair Sitting balance - Comments: no UE support needed   Standing balance support: Bilateral upper extremity supported;During functional activity Standing balance-Leahy Scale: Poor Standing balance comment: requires Bil UE support for balance.                     ADL Overall ADL's : Needs assistance/impaired     Grooming: Wash/dry hands;Wash/dry face;Brushing hair;Oral care;Sitting;Set up Grooming Details (indicate cue type and reason): pt vomited with toothbrushing                             Functional mobility during ADLs: +2 for safety/equipment;Minimal assistance General ADL Comments: Pt continues to be limited by poor activity tolerance and 02 desaturation.      Vision                     Perception     Praxis      Cognition   Behavior During Therapy: [facility] for tasks assessed/performed Overall Cognitive Status: Within Functional Limits for tasks assessed       Memory: Decreased short-term memory  Extremity/Trunk Assessment               Exercises     Shoulder Instructions       General Comments      Pertinent Vitals/ Pain       Pain Assessment: No/denies pain  Home Living                                          Prior Functioning/Environment              Frequency Min 2X/week     Progress Toward Goals  OT Goals(current goals can now be found in the care plan section)  Progress towards OT goals: Progressing toward goals  Acute Rehab OT Goals Patient Stated Goal: to go home  Plan Discharge plan needs to be updated    Co-evaluation    PT/OT/SLP Co-Evaluation/Treatment: Yes Reason for  Co-Treatment: For patient/therapist safety PT goals addressed during session: Mobility/safety with mobility OT goals addressed during session: ADL's and self-care      End of Session Equipment Utilized During Treatment: Gait belt;Oxygen   Activity Tolerance Patient limited by fatigue   Patient Left in chair;with call bell/phone within reach;with nursing/sitter in room   Nurse Communication  (aware of desaturation and vomiting, called RT)        Time: 1610-9604 OT Time Calculation (min): 45 min  Charges: OT General Charges $OT Visit: 1 Procedure OT Treatments $Self Care/Home Management : 8-22 mins  Evern Bio 03/06/2015, 12:38 PM  [phone]

## 2015-03-06 NOTE — Consult Note (Signed)
   [facility] CM Inpatient Consult   03/06/2015  [name] 12/27/1934 [id number] Patient evaluated for community based chronic disease management services with [facility] Care Management Program as a benefit of patient's [employer] Medicare Insurance. Spoke with patient and husband, [name].,  at bedside to explain [facility] Care Management services. Husband and patient states that they would like the services because "so many questions and education needs when she gets back home."  He endorses that the patient may need some rehab before returning home.  She could only take 7 steps to the door and she is attempting to sit up in the chair today.  Consent form signed.  Patient will receive post discharge transition of care call and will be evaluated for monthly home visits for assessments and disease process education.  Left contact information and [facility] literature at bedside. Made Inpatient Case Manager aware that [facility] Care Management following. Of note, [facility] Care Management services does not replace or interfere with any services that are arranged by inpatient case management or social work.  For additional questions or referrals please contact:   Charlesetta Shanks, RN BSN CCM [facility]  [phone] business mobile phone

## 2015-03-06 NOTE — Progress Notes (Signed)
Benefit check for Letaris 5 mg qd  Prior Berkley Harvey is required-[phone]- copay will be $2,306.77- price may reduce after Berkley Harvey is given. Abelino Derrick [facility] [phone]

## 2015-03-06 NOTE — Progress Notes (Signed)
Physical Therapy Treatment Patient Details Name: [name] MRN: [id number] DOB: 1935-04-24 Today's Date: 03/06/2015    History of Present Illness 79yo woman with PMH of OA, Arib, pulmonary HTN, GERD, CHF, depression who presents due to increased fatigue and SOB. She had a TTE last year which showed a PA pressure of 66 making pulmonary HTN likely. She is on home O2 for chronic respiratory failure. Apparently, over the past few days she has not wanted to take her lasix b/c she did not want to have to go to the bathroom so much. She presented to her PCP today and was noted to have pOx of 89% on her home O2 of 4L. She was also noted to only be able to walk about 10 feet when she needed to stop and rest due to her SOB.    PT Comments    Pt admitted with above diagnosis. Pt currently with functional limitations due to decr balance and endurance deficits. Pt ambulated 7 feet today and tolerated it with incr O2 via face mask.  Able to replace to 4.5LO2.  OT also with PT co-rxing.  OT had pt brush hair, wash face and brush teeth with pt getting sick and vomiting and desat to 83% and could not recover. Nursing called and she placed pt on nonrebreather for 5 minutes to allow recovery and called RT.  Pt then was able to incr sats to 93% on 5LO2.  Poor tolerance of exercise.    Pt will benefit from skilled PT to increase their independence and safety with mobility to allow discharge to the venue listed below.    Follow Up Recommendations  SNF;Supervision/Assistance - 24 hour (Possibly SNF for therapy)     Equipment Recommendations  Rolling walker with 5" wheels;3in1 (PT)    Recommendations for Other Services       Precautions / Restrictions Precautions Precautions: Fall Precaution Comments: watch sats Restrictions Weight Bearing Restrictions: No    Mobility  Bed Mobility Overal bed mobility: Needs Assistance Bed Mobility: Supine to Sit;Rolling Rolling: Modified independent  (Device/Increase time)   Supine to sit: Supervision     General bed mobility comments: use of rail, verbal cues for sequence. Once pt sat up, pt on 4.5 LO2 O2 dropped to 85% therefore placed on 50% face mask with sats 91%.    Transfers Overall transfer level: Needs assistance Equipment used: Rolling walker (2 wheeled) Transfers: Sit to/from Stand Sit to Stand: Min assist;+2 safety/equipment         General transfer comment: pt pulled on RW needing min assist to steady  Ambulation/Gait Ambulation/Gait assistance: Min assist;+2 safety/equipment Ambulation Distance (Feet): 7 Feet Assistive device: Rolling walker (2 wheeled) Gait Pattern/deviations: Step-through pattern;Decreased stride length;Decreased step length - right;Decreased step length - left;Drifts right/left;Trunk flexed;Shuffle   Gait velocity interpretation: Below normal speed for age/gender General Gait Details: Pt able to ambulate with cues and assist for steadying.  Sats 93% on 50% face maskfor ambulation.     Stairs            Wheelchair Mobility    Modified Rankin (Stroke Patients Only)       Balance Overall balance assessment: Needs assistance;History of Falls Sitting-balance support: No upper extremity supported;Feet supported Sitting balance-Leahy Scale: Fair Sitting balance - Comments: no UE support needed   Standing balance support: Bilateral upper extremity supported;During functional activity Standing balance-Leahy Scale: Poor Standing balance comment: requires bil UE support for balance.  Cognition Arousal/Alertness: Awake/alert Behavior During Therapy: WFL for tasks assessed/performed Overall Cognitive Status: Within Functional Limits for tasks assessed (husband answers many questions for pt)       Memory: Decreased short-term memory              Exercises      General Comments General comments (skin integrity, edema, etc.): After ambulation, pt  sats were 88-90% therefore placed on 4.5 LO2. OT had pt perform ADL's (see OT note) with pt getting sick and vomiting causing sats to drop to 83%-85% and would not recover therefore nursing called for nausea meds as well as placed a nonrebreather mask on to allow pt to recover in which sats 92% within 2 min on nonrebreather. Nursing placed back on 4.5 L and sats 87-89%. Nurse called RT to bring breathigng treatment. Sats moved to 5LO2 with sats 89-93% prior to RT coming up. Takes pt incr time to recover.       Pertinent Vitals/Pain Pain Assessment: No/denies pain  VSS    Home Living                      Prior Function            PT Goals (current goals can now be found in the care plan section) Acute Rehab PT Goals Patient Stated Goal: to go home Progress towards PT goals: Progressing toward goals    Frequency  Min 3X/week    PT Plan Current plan remains appropriate    Co-evaluation PT/OT/SLP Co-Evaluation/Treatment: Yes Reason for Co-Treatment: Complexity of the patient's impairments (multi-system involvement);For patient/therapist safety PT goals addressed during session: Mobility/safety with mobility OT goals addressed during session: ADL's and self-care     End of Session Equipment Utilized During Treatment: Gait belt;Oxygen Activity Tolerance: Patient limited by fatigue Patient left: with call bell/phone within reach;in bed;with family/visitor present;with chair alarm set     Time: 3545-6256 PT Time Calculation (min) (ACUTE ONLY): 44 min  Charges:  $Gait Training: 8-22 mins $Therapeutic Activity: 8-22 mins                    G Codes:      [name], [name] 2015-03-17, 1:07 PM [employer] Acute Rehabilitation [phone] (pager)

## 2015-03-07 ENCOUNTER — Other Ambulatory Visit: Payer: Self-pay | Admitting: *Deleted

## 2015-03-07 ENCOUNTER — Ambulatory Visit: Payer: Commercial Managed Care - HMO | Admitting: Emergency Medicine

## 2015-03-07 LAB — BASIC METABOLIC PANEL
Anion gap: 8 (ref 5–15)
BUN: 23 mg/dL — AB (ref 6–20)
CHLORIDE: 101 mmol/L (ref 101–111)
CO2: 20 mmol/L — ABNORMAL LOW (ref 22–32)
Calcium: 8.7 mg/dL — ABNORMAL LOW (ref 8.9–10.3)
Creatinine, Ser: 0.6 mg/dL (ref 0.44–1.00)
GFR calc Af Amer: >60 mL/min (ref >60)
GFR calc non Af Amer: >60 mL/min (ref >60)
Glucose, Bld: 111 mg/dL — ABNORMAL HIGH (ref 65–99)
POTASSIUM: 5 mmol/L (ref 3.5–5.1)
Sodium: 129 mmol/L — ABNORMAL LOW (ref 135–145)

## 2015-03-07 LAB — CBC
HCT: 46.1 % — ABNORMAL HIGH (ref 36.0–46.0)
Hemoglobin: 15.5 g/dL — ABNORMAL HIGH (ref 12.0–15.0)
MCH: 28.9 pg (ref 26.0–34.0)
MCHC: 33.6 g/dL (ref 30.0–36.0)
MCV: 86 fL (ref 78.0–100.0)
Platelets: 308 10*3/uL (ref 150–400)
RBC: 5.36 MIL/uL — AB (ref 3.87–5.11)
RDW: 15.2 % (ref 11.5–15.5)
WBC: 10.8 10*3/uL — AB (ref 4.0–10.5)

## 2015-03-07 LAB — PROTIME-INR
INR: 2.23 — ABNORMAL HIGH (ref 0.00–1.49)
Prothrombin Time: 24.5 seconds — ABNORMAL HIGH (ref 11.6–15.2)

## 2015-03-07 MED ORDER — ADULT MULTIVITAMIN W/MINERALS CH
1.0000 | ORAL_TABLET | Freq: Every day | ORAL | Status: DC
  Administered 2015-03-07 – 2015-03-08 (×2): 1 via ORAL
  Filled 2015-03-07 (×2): qty 1

## 2015-03-07 MED ORDER — WARFARIN SODIUM 1 MG PO TABS
1.0000 mg | ORAL_TABLET | Freq: Once | ORAL | Status: AC
  Administered 2015-03-07: 1 mg via ORAL
  Filled 2015-03-07: qty 1

## 2015-03-07 MED ORDER — FUROSEMIDE 20 MG PO TABS
20.0000 mg | ORAL_TABLET | Freq: Every day | ORAL | Status: DC
  Administered 2015-03-07 – 2015-03-08 (×2): 20 mg via ORAL
  Filled 2015-03-07 (×2): qty 1

## 2015-03-07 MED ORDER — PRO-STAT SUGAR FREE PO LIQD
30.0000 mL | Freq: Every day | ORAL | Status: DC
  Administered 2015-03-07: 30 mL via ORAL
  Filled 2015-03-07 (×2): qty 30

## 2015-03-07 NOTE — Progress Notes (Signed)
Advanced Heart Failure Rounding Note   Subjective:    This admission, she was diuresed with IV Lasix. Last echo in 5/16 showed EF 55-60%, PA systolic pressure 66 mmHg, RV apparently normal (will need to review). She was seen by Dr Royann Shivers who recommended right and left heart cath (workup pulmonary hypertension and also with history of CAD).   Filling pressures were very low on RHC 03/05/15. Significant PAH with PVR around 9.   Says she feels about the same as yesterday. Moderately comfortable at rest but still has some SOB with conversation.   No diuresis at this time, volume status appears stable.    Objective:   Weight Range: 124 lb 9.6 oz (56.518 kg) Body mass index is 25.15 kg/(m^2).   Vital Signs:   Temp:  [97.5 F (36.4 C)-97.8 F (36.6 C)] 97.8 F (36.6 C) (07/15 0430) Pulse Rate:  [60-86] 60 (07/15 0430) Resp:  [18] 18 (07/15 0430) BP: (106-121)/(51-61) 111/61 mmHg (07/15 0430) SpO2:  [89 %-96 %] 96 % (07/15 0929) Weight:  [124 lb 9.6 oz (56.518 kg)] 124 lb 9.6 oz (56.518 kg) (07/15 0430) Last BM Date: 03/05/15  Weight change: Filed Weights   03/05/15 0532 03/06/15 0511 03/07/15 0430  Weight: 121 lb 1.9 oz (54.939 kg) 124 lb 3.2 oz (56.337 kg) 124 lb 9.6 oz (56.518 kg)    Intake/Output:   Intake/Output Summary (Last 24 hours) at 03/07/15 0946 Last data filed at 03/07/15 0920  Gross per 24 hour  Intake    900 ml  Output    551 ml  Net    349 ml     Physical Exam: General: Pleasant, NAD. Neuro: Alert and oriented X 3. Moves all extremities spontaneously. Psych: Normal affect. HEENT: Normal Neck: Supple without bruits. JVP 8 Lungs: Resp regular and unlabored, CTA. Heart: RRR no s3, s4, or murmurs appreciated Abdomen: Soft, non-tender, non-distended, BS + x 4.  Extremities: No clubbing, cyanosis or edema. DP/PT/Radials 2+ and equal bilaterally.  Telemetry:  Afib, rate controlled in 60s  Labs: CBC  Recent Labs  03/06/15 0416  03/07/15 0341  WBC 8.1 10.8*  HGB 14.0 15.5*  HCT 42.6 46.1*  MCV 86.9 86.0  PLT 336 308   Basic Metabolic Panel  Recent Labs  03/06/15 0416 03/07/15 0341  NA 133* 129*  K 4.0 5.0  CL 101 101  CO2 25 20*  GLUCOSE 103* 111*  BUN 22* 23*  CALCIUM 8.3* 8.7*   Liver Function Tests  Recent Labs  03/05/15 0258  AST 93*  ALT 83*  ALKPHOS 123  BILITOT 0.8  PROT 7.3  ALBUMIN 2.9*   No results for input(s): LIPASE, AMYLASE in the last 72 hours. Cardiac Enzymes No results for input(s): CKTOTAL, CKMB, CKMBINDEX, TROPONINI in the last 72 hours.  BNP: BNP (last 3 results)  Recent Labs  02/28/15 1354 03/01/15 0108  BNP 393.9* 491.9*    ProBNP (last 3 results) No results for input(s): PROBNP in the last 8760 hours.   D-Dimer No results for input(s): DDIMER in the last 72 hours. Hemoglobin A1C No results for input(s): HGBA1C in the last 72 hours. Fasting Lipid Panel No results for input(s): CHOL, HDL, LDLCALC, TRIG, CHOLHDL, LDLDIRECT in the last 72 hours. Thyroid Function Tests No results for input(s): TSH, T4TOTAL, T3FREE, THYROIDAB in the last 72 hours.  Invalid input(s): FREET3  Other results:     Imaging/Studies:  Ct Chest High Resolution  03/06/2015   CLINICAL DATA:  80 year old female with increasing  shortness of breath over the past year. Clinical concern for interstitial lung disease. History of asthma. Currently on home oxygen.  EXAM: CT CHEST WITHOUT CONTRAST  TECHNIQUE: Multidetector CT imaging of the chest was performed following the standard protocol without intravenous contrast. High resolution imaging of the lungs, as well as inspiratory and expiratory imaging, was performed.  COMPARISON:  Chest CT 11/13/2013.  FINDINGS: Mediastinum/Lymph Nodes: Heart size is mildly enlarged. There is no significant pericardial fluid, thickening or pericardial calcification. There is atherosclerosis of the thoracic aorta, the great vessels of the mediastinum and  the coronary arteries, including calcified atherosclerotic plaque in the left main, left anterior descending, left circumflex and right coronary arteries. Mild calcifications of the aortic valve. Left-sided pacemaker device in position with lead tips terminating in the right atrium and the right ventricle near the apex. Multiple prominent borderline enlarged mediastinal and hilar lymph nodes are noted. Esophagus is unremarkable in appearance. No axillary lymphadenopathy.  Lungs/Pleura: High-resolution images demonstrate extensive patchy areas of ground-glass attenuation asymmetrically distributed in the lungs bilaterally (left greater than right), typically in a peribronchovascular distribution. This is associated with extensive thickening of the adjacent peribronchovascular interstitium, as well as septal thickening and some subpleural reticulation. In addition, there is a more peripherally located area of consolidation and airspace disease in the left upper lobe which is somewhat wedge-shaped. Scattered areas of mild cylindrical bronchiectasis are noted throughout these same regions. No frank honeycombing is confidently identified at this time. No clearly definable craniocaudal gradient, although there is more basilar involvement at this time. No large suspicious appearing pulmonary nodules or masses. Inspiratory and expiratory imaging demonstrates some mild air trapping, indicative of mild small airways disease. No pleural effusions.  Upper Abdomen: Mild diffuse decreased attenuation throughout the hepatic parenchyma, indicative of mild hepatic steatosis. Atherosclerosis.  Musculoskeletal/Soft Tissues: There are no aggressive appearing lytic or blastic lesions noted in the visualized portions of the skeleton.  IMPRESSION: 1. Unusual appearance of the lung parenchyma, as discussed above. These findings could certainly reflect an underlying interstitial lung disease, but the pattern is nonspecific. At this time,  findings are favored to reflect nonspecific interstitial pneumonia (NSIP), or potentially cryptogenic organizing pneumonia (COP). No definite evidence to suggest more aggressive process such as usual interstitial pneumonia (UIP) at this time. 2. The possibility of concurrent or residual infectious airspace consolidation is not excluded, particularly in the periphery of the left upper lobe. Clinical correlation for signs and symptoms of pneumonia is recommended. 3. For further evaluation of these findings, a repeat high-resolution chest CT is suggested in 6-12 months to assess for temporal changes in the appearance of the lung parenchyma if clinically appropriate. 4. Atherosclerosis, including left main and 3 vessel coronary artery disease. Assessment for potential risk factor modification, dietary therapy or pharmacologic therapy may be warranted, if clinically indicated. 5. Mild hepatic steatosis. 6. Additional incidental findings, as above.   Electronically Signed   By: Trudie Reed M.D.   On: 03/06/2015 14:11     Latest Echo  Latest Cath  RHC/LHC (7/13):  Hemodynamics (mmHg) RA mean 1 RV 43/1 PA 52/19, mean 31 PCWP mean 6 LV 94/1 AO 106/61 Cardiac Output (Fick) 3.11  Cardiac Index (Fick) 2.09 PVR 8 WU Cardiac Output (Thermo) 2.74 Cardiac Index (Thermo) 1.84  PVR 9.1 WU Left Main  Short, no significant disease.      Left Anterior Descending  50% napkin ring-like stenosis in the proximal LAD. 50% ostial D2 (moderate vessel).     Left  Circumflex  Luminal irregularities.     Right Coronary Artery  Small, co-dominant vessel with 60% stenosis in the mid-portion (similar to prior study).           Medications:     Scheduled Medications: . antiseptic oral rinse  7 mL Mouth Rinse q12n4p  . atorvastatin  10 mg Oral q1800  . budesonide (PULMICORT) nebulizer solution  0.25 mg Nebulization BID  . chlorhexidine  15 mL Mouth Rinse BID  . doxycycline  100 mg  Oral BID  . escitalopram  10 mg Oral Daily  . feeding supplement (PRO-STAT SUGAR FREE 64)  30 mL Oral BID  . fluticasone  2 spray Each Nare Daily  . lisinopril  2.5 mg Oral Daily  . loratadine  10 mg Oral Daily  . pantoprazole sodium  80 mg Per Tube Daily  . sodium chloride  3 mL Intravenous Q12H  . sodium chloride  3 mL Intravenous Q12H  . Tadalafil (PAH)  20 mg Oral Daily  . tiotropium  18 mcg Inhalation Daily  . traZODone  200 mg Oral QHS  . warfarin  1 mg Oral ONCE-1800  . Warfarin - Pharmacist Dosing Inpatient   Does not apply q1800     Infusions: . sodium chloride Stopped (03/05/15 1900)     PRN Medications:  sodium chloride, sodium chloride, acetaminophen, albuterol, ondansetron (ZOFRAN) IV, sodium chloride, sodium chloride   Assessment/Plan    1. Acute on chronic diastolic CHF: EF 16-10% on 5/16 echo with PASP 66 mmHg.  - Concern complicated by PAH  is playing a large role in her presentation.  - Appears to have low fluid on board but remains SOB with ambulation - Filling pressures low on RHC 03/05/15 - No Lasix for now, will likely eventually need a low dose at home.  2. Pulmonary hypertension:  -  Significant PAH with marginal cardiac output (albeit in setting of hypovolemia) on RHC, possibly group 1 related to rheumatological disease.  - RF positive; ANA weakly positive ( negative anti-dsDNA).  - Hx of bronchiectasis with mild obstructive PFTs.  - CT chest 3/15 did not show ILD but she is much more symptomatic now.  - V/Q scan in 5/16 negative for PE.  - Needs rheum workup: ?RA => will send CCP antibody (re-order, does not appear to have been sent yesterday).  - ?ILD related to rheumatological disease, ?RA => will need high resolution non-contrast chest CT (has been ordered). ?ILD may explain some of her dyspnea - On tadalafil 20 daily (mild transaminitis so will start this first instead of ambrisentan). Based on most recent trial data, would like to  get her on ERA soon as well.  3. CAD: Moderate CAD on coronary angiography on 7/13.  - Continue atorvastatin. Not on ASA as she has been on warfarin.  4. Atrial fibrillation: Permanent it appears at this point.  - HR not elevated, not on rate control meds.  - Warfarin resumed. Dosing per pharm 5. PPM: MDT, h/o SN dysfunction.  6. H/o Takotsubo CMP: resolved.   Length of Stay: 7   Graciella Freer PA-C 03/07/2015, 9:46 AM  Advanced Heart Failure Team Pager [phone] (M-F; 7a - 4p)  Please contact [facility] for night-coverage after hours (4p -7a ) and weekends on [email or web address]  Patient seen with PA, agree with the above note.  She remains very weak.  Tadalafil started yesterday, tolerating so far.  Continue, will plan to titrate to 40 mg daily then add ERA fairly  rapidly.    I am going to resume her Lasix 20 mg daily today.   High resolution CT chest yesterday showed interstitial process: nonspecific interstitial pnemonia versus potentially cryptogenic organizing pneumonia (COP).  ?Interstitial lung disease related to rheumatological process.  Still waiting for CCP antibody, not sure it has been sent.  Will check (again) with lab.   Marca Ancona 03/07/2015 2:48 PM

## 2015-03-07 NOTE — Progress Notes (Signed)
Bladder scan done after pt cant void 8 hrs after foley was discontinued pt claims bladder discomfort. Straight cath done obtained 325 ml of urine.

## 2015-03-07 NOTE — Patient Outreach (Signed)
[facility]) Care Management  03/07/2015  [name] 09/23/1934 [id number]  CSW received a high priority referral on patient today from [employer], Hospital Liaison with [facility] Care Management.  According to [name], patient is being discharged from [facility] and will receive short-term Skilled Nursing Placement at [facility], prior to returning home to live with her husband, [name] and son, [name].  [name] indicated that patient will require medication follow-up and disease management services, upon returning home from skilled placement.  Wyatt Haste, RNCM with [facility] Care Management is aware that patient will need transition of care services upon discharge. CSW made an initial attempt to try and contact patient today on her cell phone, to perform phone assessment, as well as assess and assist with social work needs and services; however, patient was unavailable.  A HIPAA compliant message was left on voicemail.  After thorough review of patient's EMR (Electronic Medical Record) in [facility], it was noted that patient is still hospitalized, without a tentative discharge date set at this time, as patient was not medically stable and ready for discharge to skilled facility today.  CSW will continue to follow patient while hospitalized, to know when patient is discharged to skilled facility to assist with discharge planning arrangements and services.     Danford Bad, BSW, MSW, LCSW [facility] [address] [place], [address] [place], [place] [address] Mardene [email or web address] [phone]

## 2015-03-07 NOTE — Progress Notes (Signed)
PCCM Interval Note  Visited w pt this pm, she appears to be tolerating the tadalafil with some mild nausea. I will ask my office to start paperwork for the med from speciality pharmacy, starting w 20mg  daily and plan increase to 40mg  daily  Levy Pupa, MD, PhD 03/07/2015, 1:14 PM [facility] [phone] or if no answer [phone]

## 2015-03-07 NOTE — Progress Notes (Signed)
Bed is available at [facility]- husband went to facility today to sign admissions paperwork.  Per  Multiple discussions with Dr. Gwenlyn Perking- it was determined that patient was not medically stable for d/c today to SNF. Patent and husband verbalized understanding of d/c delay and want to remain in the hospital "as long as needed to get the appropriate treatment". Possible d/c to SNF tomorrow per MD if medically stable. SNF will accept patient over the weekend if indicated.  Report provided to weekend CSW for follow up in the morning.  Lorri Frederick. Jaci Lazier, [place] [phone]

## 2015-03-07 NOTE — Progress Notes (Signed)
Bladder scan done after pt tried 2 x unsuccesfuly to void spontaneously. Noted less 200 amt of urine in bladder.will let pt try to void 2 x as per protocol. Bladder non distended. No c/o discomfort this time.continued to observe pt

## 2015-03-07 NOTE — Progress Notes (Signed)
Physical Therapy Treatment Patient Details Name: [name] MRN: [id number] DOB: 07-17-1935 Today's Date: 03/07/2015    History of Present Illness 79yo woman with PMH of OA, Arib, pulmonary HTN, GERD, CHF, depression who presents due to increased fatigue and SOB. She had a TTE last year which showed a PA pressure of 66 making pulmonary HTN likely. She is on home O2 for chronic respiratory failure. Apparently, over the past few days she has not wanted to take her lasix b/c she did not want to have to go to the bathroom so much. She presented to her PCP today and was noted to have pOx of 89% on her home O2 of 4L. She was also noted to only be able to walk about 10 feet when she needed to stop and rest due to her SOB.    PT Comments    Pt admitted with above diagnosis. Pt currently with functional limitations due to balance and endurance deficitsas well as deconditioning.  Pt only able to perform exercises.  Baseline O2 on 5 LO2 was 89-90%.  O2 with exercises 84-87%.  Pt will benefit from skilled PT to increase their independence and safety with mobility to allow discharge to the venue listed below.    Follow Up Recommendations  SNF;Supervision/Assistance - 24 hour (Possibly SNF for therapy)     Equipment Recommendations  Rolling walker with 5" wheels;3in1 (PT)    Recommendations for Other Services       Precautions / Restrictions Precautions Precautions: Fall Precaution Comments: watch sats Restrictions Weight Bearing Restrictions: No    Mobility  Bed Mobility               General bed mobility comments: N/A as session was for exercise.  did assist pt up to [facility] after exercise as she had slid down in bed a bit.    Transfers                    Ambulation/Gait                 Stairs            Wheelchair Mobility    Modified Rankin (Stroke Patients Only)       Balance                                    Cognition  Arousal/Alertness: Awake/alert Behavior During Therapy: WFL for tasks assessed/performed Overall Cognitive Status: Within Functional Limits for tasks assessed       Memory: Decreased short-term memory              Exercises General Exercises - Lower Extremity Ankle Circles/Pumps: AROM;Both;10 reps;Supine Quad Sets: AROM;Both;10 reps;Supine Heel Slides: AROM;Both;10 reps;Supine Hip ABduction/ADduction: AROM;Both;10 reps;Supine Straight Leg Raises: AROM;Both;10 reps;Supine    General Comments General comments (skin integrity, edema, etc.): Husband states her 3N1 was too high last time she tried to get on it to urinate and pt couldn't go because of that. This PT lowered 3N1 to lowest setting.        Pertinent Vitals/Pain Pain Assessment: No/denies pain   Baseline O2 on 5 LO2 was 89-90%.  O2 with exercises 84-87%. After exercises with rest, O2 back to 89-90% on 5LO2    Home Living                      Prior Function  PT Goals (current goals can now be found in the care plan section) Progress towards PT goals: Progressing toward goals    Frequency  Min 3X/week    PT Plan Current plan remains appropriate    Co-evaluation             End of Session Equipment Utilized During Treatment: Gait belt;Oxygen Activity Tolerance: Patient limited by fatigue Patient left: in bed;with call bell/phone within reach;with bed alarm set;with family/visitor present     Time: 6286-3817 PT Time Calculation (min) (ACUTE ONLY): 21 min  Charges:  $Therapeutic Exercise: 8-22 mins                    G CodesTawni Millers F Mar 08, 2015, 4:38 PM [employer] [facility] [phone] (pager)

## 2015-03-07 NOTE — Progress Notes (Signed)
Called to verify that cyclic citrul peptide antibody, IgG lab was collected. Reported to been drawn at 1517 on 03/06/15 and sent to [facility] lab.

## 2015-03-07 NOTE — Progress Notes (Signed)
Nutrition Follow-up  DOCUMENTATION CODES:   Non-severe (moderate) malnutrition in context of acute illness/injury  INTERVENTION:   Provide Pro-stat once daily Provide snack once daily Provide Multivitamin with minerals daily  NUTRITION DIAGNOSIS:   Inadequate oral intake related to acute illness as evidenced by per patient/family report, meal completion < 50%. Ongoing   GOAL:   Patient will meet greater than or equal to 90% of their needs  Unmet  MONITOR:   PO intake, Supplement acceptance, Weight trends, Labs, I & O's  REASON FOR ASSESSMENT:   Malnutrition Screening Tool    ASSESSMENT:   Pt's weight has dropped an additional 4 to 7 lbs in the past week. Per nursing notes pt is eating 25% to 100% of meals. Pt receiving Pros-stat twice daily but, pt refuses about half the time. RD will provide HS snack to help prevent further weight loss..  Labs: low sodium, low calcium, high hemoglobin, high PT/INR  Diet Order:  Diet Heart Room service appropriate?: Yes; Fluid consistency:: Thin  Skin:  Wound (see comment) (L leg cellulitis)  Last BM:  7/13  Height:   Ht Readings from Last 1 Encounters:  03/01/15 4\' 11"  (1.499 m)    Weight:   Wt Readings from Last 1 Encounters:  03/07/15 124 lb 9.6 oz (56.518 kg)    Ideal Body Weight:  44.1 kg (kg)  Wt Readings from Last 10 Encounters:  03/07/15 124 lb 9.6 oz (56.518 kg)  02/28/15 132 lb 6.4 oz (60.056 kg)  02/03/15 140 lb 9.6 oz (63.776 kg)  01/16/15 142 lb (64.411 kg)  12/17/14 139 lb (63.05 kg)  12/10/14 139 lb 12.8 oz (63.413 kg)  08/08/14 141 lb 12.8 oz (64.32 kg)  05/08/14 141 lb 9.6 oz (64.229 kg)  04/01/14 143 lb (64.864 kg)  01/23/14 148 lb 9.6 oz (67.405 kg)    BMI:  Body mass index is 25.15 kg/(m^2).  Estimated Nutritional Needs:   Kcal:  1300-1500  Protein:  55-65 grams  Fluid:  1.7 L/day  EDUCATION NEEDS:   No education needs identified at this time  Ian Malkin RD, LDN Inpatient  Clinical Dietitian Pager: [phone] After Hours Pager: [phone]

## 2015-03-07 NOTE — Progress Notes (Signed)
ANTICOAGULATION CONSULT NOTE   Pharmacy Consult for Heparin Indication: atrial fibrillation  Allergies  Allergen Reactions  . Benzoyl Peroxide Swelling    Swelling at the site of use  . Neomycin-Bacitracin Zn-Polymyx Swelling    Swelling at the site of use    Patient Measurements: Height: 4\' 11"  (149.9 cm) Weight: 124 lb 9.6 oz (56.518 kg) (scale a) IBW/kg (Calculated) : 43.2   Vital Signs: Temp: 97.8 F (36.6 C) (07/15 0430) Temp Source: Oral (07/15 0430) BP: 111/61 mmHg (07/15 0430) Pulse Rate: 60 (07/15 0430)  Labs:  Recent Labs  03/05/15 0258 03/06/15 0416 03/07/15 0341  HGB 15.6* 14.0 15.5*  HCT 46.9* 42.6 46.1*  PLT 371 336 308  LABPROT 18.8* 17.9* 24.5*  INR 1.57* 1.47 2.23*  CREATININE 0.74 0.60 0.60    Estimated Creatinine Clearance: 45.1 mL/min (by C-G formula based on Cr of 0.6).   Assessment: 64 YOF admitted on 02/28/15 with acute on chronic respiratory failure/cellulitis, takes warfarin PTA (Warfarin 2.5 mg on Tues/Thurs/Sun, 1.25 mg all other days from anticoag outpatient notes).  Spoke with pt about home dose and is taking 2.5 mg T/Th, 1.25 mg AOD.  S/p cath and restarted warfarin. INR bumped overnight from 1.47>>2.2 after boosted dose.  Goal of Therapy:  INR 2-3 Monitor platelets by anticoagulation protocol: Yes   Plan:  Coumadin 1mg  x1 today Daily INR  Sheppard Coil PharmD., BCPS Clinical Pharmacist Pager [phone] 03/07/2015 8:43 AM

## 2015-03-07 NOTE — Progress Notes (Signed)
[facility] PROGRESS NOTE Interim History: 80yo woman with PMH of OA, Arib, pulmonary HTN, GERD, CHF, depression who presents due to increased fatigue and SOB. She had a TTE last year which showed a PA pressure of 66 making pulmonary HTN likely. She is on home O2 for chronic respiratory failure. Apparently, over the past few days she has not wanted to take her lasix b/c she did not want to have to go to the bathroom so much. She presented to her PCP today and was noted to have pOx of 89% on her home O2 of 4L. She was also noted to only be able to walk about 10 feet when she needed to stop and rest due to her SOB.  Filed Weights   03/05/15 0532 03/06/15 0511 03/07/15 0430  Weight: 54.939 kg (121 lb 1.9 oz) 56.337 kg (124 lb 3.2 oz) 56.518 kg (124 lb 9.6 oz)        Intake/Output Summary (Last 24 hours) at 03/07/15 1346 Last data filed at 03/07/15 1200  Gross per 24 hour  Intake    660 ml  Output    553 ml  Net    107 ml     Assessment/Plan: Acute on chronic respiratory failure: multifactorial due to  Acute on chronic diastolic heart failure and pulmonary hypertension: -oral lasix to be decided by cardiology rec's -volume stable and no worsening SOB - will monitor electrolytes.  - not on Beta blockers. Tolerating ACE-I - On nasal canula for O2 supplementation; breathing stable. - S/p R/L cath -plan is to treat pulmonary HTN - Appreciate cards and pulmonary assistance.  Cellulitis of left lower extremity - Started on IV vancomycin on 7.8.2016, afebrile, erythema essentially resolved - continue doxycycline as planned to finish therapy  Pulmonary hypertension: -plan base on results of her cath is to start tadalafil  -will follow rec's -continue O2 supplementation  -no signs of fluid overload on exam currently  Atrial fibrillation-persistent - Rate controlled.  -currently on IV heparin for INR < 2 in process of heart cath today -will resume coumadin today per  pharmacy  Physical deconditioning -per PT/OT rec's will benefit of SNF for rehab -family in agreement -CSW involved and bed search active    Code Status: full Family Communication: husband and son at bedside Disposition Plan: SNF for rehabilitation; once ok with pulmonary service and cardiology service   Consultants:  Pulmonary service  Cardiology   Procedures: ECHO: 12/25/14 - Left ventricle: The cavity size was normal. Wall thickness was normal. Systolic function was normal. The estimated ejection fraction was in the range of 55% to 60%. Wall motion was normal; there were no regional wall motion abnormalities. - Left atrium: The atrium was mildly dilated. - Right atrium: The atrium was mildly dilated. - Pulmonary arteries: Systolic pressure was moderately increased. PA peak pressure: 66 mm Hg (S).  Cath (right and left heart cath): plan for today 7/13  Antibiotics:  vanc-> doxy cont until 7.15.2016  HPI/Subjective: No fever, no N/V or abd pain, patient reports LE swelling and erythema essentially resolved. Denies CP and breathing is stable.  Objective: Filed Vitals:   03/06/15 2048 03/07/15 0430 03/07/15 0929 03/07/15 1000  BP:  111/61  112/56  Pulse: 84 60  77  Temp:  97.8 F (36.6 C)  97.5 F (36.4 C)  TempSrc:  Oral  Oral  Resp: 18 18  20   Height:      Weight:  56.518 kg (124 lb 9.6 oz)  SpO2: 90% 96% 96% 90%     Exam:  General: Alert, awake, oriented x3, in no acute distress. Patient with O2 supplement and reporting breathing is stable. No CP. No fever. Weak and with very limited exercise capacity HEENT: No bruits, no goiter. - JVD Heart: Regular rate and rhythm. Lungs: Good air movement, no frank crackles on exam Abdomen: Soft, nontender, nondistended, positive bowel sounds.  Neuro: Grossly intact, nonfocal. Extremities: LLE cellulitis essentially resolved. Trace edema bilaterally, no cyanosis   Data Reviewed: Basic Metabolic  Panel:  Recent Labs Lab 03/03/15 0341 03/04/15 0305 03/05/15 0258 03/06/15 0416 03/07/15 0341  NA 131* 130* 129* 133* 129*  K 4.3 3.7 4.4 4.0 5.0  CL 94* 94* 95* 101 101  CO2 20*  GLUCOSE 120* 102* 120* 103* 111*  BUN 15 23* 25* 22* 23*  CREATININE 0.71 0.71 0.74 0.60 0.60  CALCIUM 8.6* 8.6* 8.5* 8.3* 8.7*   Liver Function Tests:  Recent Labs Lab 02/28/15 1354 03/05/15 0258  AST 80* 93*  ALT 68* 83*  ALKPHOS 120 123  BILITOT 0.9 0.8  PROT 7.1 7.3  ALBUMIN 3.4* 2.9*   CBC:  Recent Labs Lab 02/28/15 1354 03/01/15 0604 03/05/15 0258 03/06/15 0416 03/07/15 0341  WBC 8.4 8.6 11.2* 8.1 10.8*  HGB 13.8 14.4 15.6* 14.0 15.5*  HCT 41.5 43.0 46.9* 42.6 46.1*  MCV 86.8 86.7 86.1 86.9 86.0  PLT 332 293 371 336 308   Cardiac Enzymes:  Recent Labs Lab 02/28/15 1354 02/28/15 2005 03/01/15 0108 03/01/15 0604 03/01/15 1350  TROPONINI 0.08* 0.12* 0.10* 0.10* 0.09*   BNP (last 3 results)  Recent Labs  02/28/15 1354 03/01/15 0108  BNP 393.9* 491.9*    Recent Results (from the past 240 hour(s))  MRSA PCR Screening     Status: None   Collection Time: 02/28/15 11:22 PM  Result Value Ref Range Status   MRSA by PCR NEGATIVE NEGATIVE Final    Comment:        The GeneXpert MRSA Assay (FDA approved for NASAL specimens only), is one component of a comprehensive MRSA colonization surveillance program. It is not intended to diagnose MRSA infection nor to guide or monitor treatment for MRSA infections.      Studies: Ct Chest High Resolution  03/06/2015   CLINICAL DATA:  80 year old female with increasing shortness of breath over the past year. Clinical concern for interstitial lung disease. History of asthma. Currently on home oxygen.  EXAM: CT CHEST WITHOUT CONTRAST  TECHNIQUE: Multidetector CT imaging of the chest was performed following the standard protocol without intravenous contrast. High resolution imaging of the lungs, as well as inspiratory  and expiratory imaging, was performed.  COMPARISON:  Chest CT 11/13/2013.  FINDINGS: Mediastinum/Lymph Nodes: Heart size is mildly enlarged. There is no significant pericardial fluid, thickening or pericardial calcification. There is atherosclerosis of the thoracic aorta, the great vessels of the mediastinum and the coronary arteries, including calcified atherosclerotic plaque in the left main, left anterior descending, left circumflex and right coronary arteries. Mild calcifications of the aortic valve. Left-sided pacemaker device in position with lead tips terminating in the right atrium and the right ventricle near the apex. Multiple prominent borderline enlarged mediastinal and hilar lymph nodes are noted. Esophagus is unremarkable in appearance. No axillary lymphadenopathy.  Lungs/Pleura: High-resolution images demonstrate extensive patchy areas of ground-glass attenuation asymmetrically distributed in the lungs bilaterally (left greater than right), typically in a peribronchovascular distribution. This is associated with extensive thickening of the  adjacent peribronchovascular interstitium, as well as septal thickening and some subpleural reticulation. In addition, there is a more peripherally located area of consolidation and airspace disease in the left upper lobe which is somewhat wedge-shaped. Scattered areas of mild cylindrical bronchiectasis are noted throughout these same regions. No frank honeycombing is confidently identified at this time. No clearly definable craniocaudal gradient, although there is more basilar involvement at this time. No large suspicious appearing pulmonary nodules or masses. Inspiratory and expiratory imaging demonstrates some mild air trapping, indicative of mild small airways disease. No pleural effusions.  Upper Abdomen: Mild diffuse decreased attenuation throughout the hepatic parenchyma, indicative of mild hepatic steatosis. Atherosclerosis.  Musculoskeletal/Soft Tissues:  There are no aggressive appearing lytic or blastic lesions noted in the visualized portions of the skeleton.  IMPRESSION: 1. Unusual appearance of the lung parenchyma, as discussed above. These findings could certainly reflect an underlying interstitial lung disease, but the pattern is nonspecific. At this time, findings are favored to reflect nonspecific interstitial pneumonia (NSIP), or potentially cryptogenic organizing pneumonia (COP). No definite evidence to suggest more aggressive process such as usual interstitial pneumonia (UIP) at this time. 2. The possibility of concurrent or residual infectious airspace consolidation is not excluded, particularly in the periphery of the left upper lobe. Clinical correlation for signs and symptoms of pneumonia is recommended. 3. For further evaluation of these findings, a repeat high-resolution chest CT is suggested in 6-12 months to assess for temporal changes in the appearance of the lung parenchyma if clinically appropriate. 4. Atherosclerosis, including left main and 3 vessel coronary artery disease. Assessment for potential risk factor modification, dietary therapy or pharmacologic therapy may be warranted, if clinically indicated. 5. Mild hepatic steatosis. 6. Additional incidental findings, as above.   Electronically Signed   By: Trudie Reed M.D.   On: 03/06/2015 14:11    Scheduled Meds: . antiseptic oral rinse  7 mL Mouth Rinse q12n4p  . atorvastatin  10 mg Oral q1800  . budesonide (PULMICORT) nebulizer solution  0.25 mg Nebulization BID  . chlorhexidine  15 mL Mouth Rinse BID  . doxycycline  100 mg Oral BID  . escitalopram  10 mg Oral Daily  . feeding supplement (PRO-STAT SUGAR FREE 64)  30 mL Oral Daily  . fluticasone  2 spray Each Nare Daily  . lisinopril  2.5 mg Oral Daily  . loratadine  10 mg Oral Daily  . multivitamin with minerals  1 tablet Oral Daily  . pantoprazole sodium  80 mg Per Tube Daily  . sodium chloride  3 mL Intravenous Q12H   . sodium chloride  3 mL Intravenous Q12H  . Tadalafil (PAH)  20 mg Oral Daily  . tiotropium  18 mcg Inhalation Daily  . traZODone  200 mg Oral QHS  . warfarin  1 mg Oral ONCE-1800  . Warfarin - Pharmacist Dosing Inpatient   Does not apply q1800   Continuous Infusions: . sodium chloride Stopped (03/05/15 1900)    Time: 30 minutes  Vassie Loll  [facility] Pager [phone] If 7PM-7AM, please contact night-coverage at [email or web address], password [facility] 03/07/2015, 1:46 PM  LOS: 7 days

## 2015-03-08 DIAGNOSIS — R0609 Other forms of dyspnea: Secondary | ICD-10-CM

## 2015-03-08 DIAGNOSIS — J9621 Acute and chronic respiratory failure with hypoxia: Secondary | ICD-10-CM | POA: Insufficient documentation

## 2015-03-08 DIAGNOSIS — E440 Moderate protein-calorie malnutrition: Secondary | ICD-10-CM

## 2015-03-08 DIAGNOSIS — R339 Retention of urine, unspecified: Secondary | ICD-10-CM

## 2015-03-08 DIAGNOSIS — R5381 Other malaise: Secondary | ICD-10-CM | POA: Insufficient documentation

## 2015-03-08 LAB — CBC
HEMATOCRIT: 42.3 % (ref 36.0–46.0)
HEMOGLOBIN: 13.9 g/dL (ref 12.0–15.0)
MCH: 28.7 pg (ref 26.0–34.0)
MCHC: 32.9 g/dL (ref 30.0–36.0)
MCV: 87.2 fL (ref 78.0–100.0)
Platelets: 331 10*3/uL (ref 150–400)
RBC: 4.85 MIL/uL (ref 3.87–5.11)
RDW: 15 % (ref 11.5–15.5)
WBC: 9.2 10*3/uL (ref 4.0–10.5)

## 2015-03-08 LAB — BASIC METABOLIC PANEL
Anion gap: 6 (ref 5–15)
BUN: 19 mg/dL (ref 6–20)
CHLORIDE: 99 mmol/L — AB (ref 101–111)
CO2: 25 mmol/L (ref 22–32)
Calcium: 8.5 mg/dL — ABNORMAL LOW (ref 8.9–10.3)
Creatinine, Ser: 0.56 mg/dL (ref 0.44–1.00)
GFR calc non Af Amer: >60 mL/min (ref >60)
Glucose, Bld: 102 mg/dL — ABNORMAL HIGH (ref 65–99)
POTASSIUM: 3.6 mmol/L (ref 3.5–5.1)
Sodium: 130 mmol/L — ABNORMAL LOW (ref 135–145)

## 2015-03-08 LAB — PROTIME-INR
INR: 3.19 — AB (ref 0.00–1.49)
Prothrombin Time: 32 seconds — ABNORMAL HIGH (ref 11.6–15.2)

## 2015-03-08 MED ORDER — ALBUTEROL SULFATE (2.5 MG/3ML) 0.083% IN NEBU: 3.0000 mL | INHALATION_SOLUTION | RESPIRATORY_TRACT | Status: DC | PRN

## 2015-03-08 MED ORDER — PREDNISONE 20 MG PO TABS
40.0000 mg | ORAL_TABLET | Freq: Every day | ORAL | Status: DC
  Filled 2015-03-08: qty 2

## 2015-03-08 MED ORDER — LISINOPRIL 2.5 MG PO TABS: 2.5000 mg | ORAL_TABLET | Freq: Every day | ORAL | Status: DC

## 2015-03-08 MED ORDER — FUROSEMIDE 20 MG PO TABS: 20.0000 mg | ORAL_TABLET | Freq: Every day | ORAL | Status: DC

## 2015-03-08 MED ORDER — PRO-STAT SUGAR FREE PO LIQD: 30.0000 mL | Freq: Two times a day (BID) | ORAL | Status: DC

## 2015-03-08 MED ORDER — TADALAFIL (PAH) 20 MG PO TABS: 20.0000 mg | ORAL_TABLET | Freq: Every day | ORAL | Status: DC

## 2015-03-08 MED ORDER — WARFARIN SODIUM 2.5 MG PO TABS: 2.5000 mg | ORAL_TABLET | Freq: Every day | ORAL | Status: DC

## 2015-03-08 NOTE — Discharge Summary (Signed)
Physician Discharge Summary  [name]:[id number] DOB: 05/26/1935 DOA: 02/28/2015  PCP: Loreen Freud, DO  Admit date: 02/28/2015 Discharge date: 03/08/2015  Time spent: >30 minutes  Recommendations for Outpatient Follow-up:  1. Repeat BEMT in 1 week to follow electrolytes and renal function 2. Reassess BP and adjust medications as needed 3. Daily weight and close follow up on sodium diet 4. Will need follow up with cardiology and pulmonary service at discharge for further treatment of pulmonary HTN 5. Voiding trials and if needed urology consultation in outpatient setting for urodynamics   Discharge Diagnoses:  Active Problems:   Atrial fibrillation-persistent   Pulmonary hypertension   Acute on chronic diastolic congestive heart failure, NYHA class 2   Acute on chronic respiratory failure   Cellulitis of left lower extremity   Congestive heart disease   Malnutrition of moderate degree   Protein-calorie malnutrition, severe   Pulmonary arterial hypertension   Atrial fibrillation with slow ventricular response   Coronary artery calcification seen on CT scan   Dyspnea   Acute on chronic respiratory failure with hypoxia   Physical deconditioning   Urinary retention   Discharge Condition: stable and improved. Discharge to SNF for rehabilitation and further care. Outpatient follow up with pulmonary service and cardiology service   Diet recommendation: heart healthy diet (low sodium less than 2 gram daily)  Filed Weights   03/06/15 0511 03/07/15 0430 03/08/15 9562  Weight: 56.337 kg (124 lb 3.2 oz) 56.518 kg (124 lb 9.6 oz) 56.469 kg (124 lb 7.9 oz)    History of present illness:  80yo woman with PMH of OA, Arib, pulmonary HTN, GERD, CHF, depression who presents due to increased fatigue and SOB. She had a TTE last year which showed a PA pressure of 66 making pulmonary HTN likely. She is on home O2 for chronic respiratory failure. Apparently, over the past few days she has  not wanted to take her lasix b/c she did not want to have to go to the bathroom so much. She presented to her PCP today and was noted to have pOx of 89% on her home O2 of 4L. She was also noted to only be able to walk about 10 feet when she needed to stop and rest due to her SOB.  Hospital Course:  Acute on chronic respiratory failure: multifactorial due to Acute on chronic diastolic heart failure and pulmonary hypertension: -oral lasix 20mg  daily as recommended by cardiology service -volume stable and no worsening SOB - will monitor electrolytes. (BMET in 1 week) - not on Beta blockers. Tolerating ACE-I - On nasal canula for O2 supplementation; breathing stable. - S/p R/L cath (see results below) -plan is to treat pulmonary HTN with tadafil  - Appreciate cardiology and pulmonary assistance.  Cellulitis of left lower extremity - Swelling and erythema completely resolved at discharge - completed therapy with vanc/doxycycline  Pulmonary hypertension: -plan base on results of her cath was to start tadalafil  -will recommend daily weights -continue O2 supplementation  -no signs of fluid overload on exam at discharge; will use 20mg  lasix daily   Atrial fibrillation-persistent - Rate controlled.  -INR 3.19 at discharge -will resume coumadin home dose, starting 7/17  Physical deconditioning -per PT/OT rec's will benefit of SNF for rehab -family in agreement -CSW involved and bed search active  Urinary retention -foley has to be replaced -will need voiding trials at facility and if failed, outpatient urology visit for urodynamics   Protein calorie malnutrition (moderate) -feeding supplements as  recommended  Procedures: ECHO: 12/25/14 - Left ventricle: The cavity size was normal. Wall thickness was normal. Systolic function was normal. The estimated ejection fraction was in the range of 55% to 60%. Wall motion was normal; there were no regional wall motion  abnormalities. - Left atrium: The atrium was mildly dilated. - Right atrium: The atrium was mildly dilated. - Pulmonary arteries: Systolic pressure was moderately increased. PA peak pressure: 66 mm Hg (S).  RHC/LHC (7/13):  Hemodynamics (mmHg) RA mean 1 RV 43/1 PA 52/19, mean 31 PCWP mean 6 LV 94/1 AO 106/61 Cardiac Output (Fick) 3.11  Cardiac Index (Fick) 2.09 PVR 8 WU Cardiac Output (Thermo) 2.74 Cardiac Index (Thermo) 1.84  PVR 9.1 WU Left Main  Short, no significant disease.             Consultations:  Cardiology  Pulmonology   Discharge Exam: Filed Vitals:   03/08/15 0621  BP: 97/61  Pulse: 88  Temp: 97.5 F (36.4 C)  Resp: 20   General: Alert, awake, oriented x3, in no acute distress. Patient with O2 supplement (home amount) and reporting breathing to be stable. No CP. No fever. Weak and with very limited exercise capacity HEENT: No bruits, no goiter. - JVD Heart: Regular rate and rhythm. Lungs: Good air movement, no frank crackles on exam Abdomen: Soft, nontender, nondistended, positive bowel sounds.  Neuro: Grossly intact, nonfocal. Extremities: LLE cellulitis essentially resolved. Trace edema bilaterally, no cyanosis   Discharge Instructions   Discharge Instructions    AMB Referral to [facility]    Complete by:  As directed   Reason for consult:  HF exacerbation; HF education and follow up; new medications post hospital - likely SNF for [facility] rehab  Diagnoses of:  Heart Failure  Expected date of contact:  1-3 days (reserved for hospital discharges)  Please assign to community social worker  for transition of care to [facility]- SNF and assign to community nurse for transition of care calls and assess for home visits when discharged from [facility]- SNF.  New medication follow up and care and disease management for education, husband is caregiver and request support especially when she returns home.  Questions please call: Charlesetta Shanks, RN BSN  CCM [facility]  [phone] business mobile phone     Diet - low sodium heart healthy    Complete by:  As directed      Discharge instructions    Complete by:  As directed   Low sodium diet Watch daily weight Medications as prescribed Coumadin to be resumed on 7/17 Outpatient follow up with pulmonary service and with cardiology service as prescribed BMET in 1 week to follow electrolytes and renal function          Current Discharge Medication List    START taking these medications   Details  albuterol (PROVENTIL) (2.5 MG/3ML) 0.083% nebulizer solution Take 3 mLs by nebulization every 4 (four) hours as needed for wheezing or shortness of breath. Qty: 75 mL, Refills: 12    Amino Acids-Protein Hydrolys (FEEDING SUPPLEMENT, PRO-STAT SUGAR FREE 64,) LIQD Take 30 mLs by mouth 2 (two) times daily.    lisinopril (PRINIVIL,ZESTRIL) 2.5 MG tablet Take 1 tablet (2.5 mg total) by mouth daily.    Tadalafil, PAH, 20 MG TABS Take 1 tablet (20 mg total) by mouth daily.      CONTINUE these medications which have CHANGED   Details  furosemide (LASIX) 20 MG tablet Take 1 tablet (20 mg total) by mouth  daily.    warfarin (COUMADIN) 2.5 MG tablet Take 1 tablet (2.5 mg total) by mouth daily at 6 PM. 2.5 mg on Tues and Thurs.  1.25 mg all other days. Qty: 30 tablet, Refills: 3      CONTINUE these medications which have NOT CHANGED   Details  Ascorbic Acid (VITAMIN C) 1000 MG tablet Take 1,000 mg by mouth daily.      beclomethasone (QVAR) 80 MCG/ACT inhaler Inhale 2 puffs into the lungs 2 (two) times daily. Qty: 1 Inhaler, Refills: 6    BEPREVE 1.5 % SOLN Place 1 drop into both eyes at bedtime.     budesonide (RHINOCORT AQUA) 32 MCG/ACT nasal spray Place 1 spray into both nostrils daily. Qty: 1 Bottle, Refills: 6   Associated Diagnoses: Chronic rhinitis    Calcium Carbonate (CALCIUM 600) 1500 MG TABS Take 1 tablet by mouth daily.      Cholecalciferol (VITAMIN D3)  1000 UNITS CAPS Take 1 capsule by mouth daily.      escitalopram (LEXAPRO) 10 MG tablet TAKE 1 TABLET (10 MG TOTAL) BY MOUTH DAILY. Qty: 30 tablet, Refills: 5    esomeprazole (NEXIUM) 40 MG capsule TAKE ONE CAPSULE BY MOUTH EVERY DAY BEFORE BREAKFAST Qty: 30 capsule, Refills: 5    fluticasone (FLONASE) 50 MCG/ACT nasal spray Place 2 sprays into both nostrils 2 (two) times daily. Qty: 16 g, Refills: 11   Associated Diagnoses: Chronic rhinitis    loratadine (CLARITIN) 10 MG tablet Take 10 mg by mouth daily.    Multiple Vitamin (MULTIVITAMIN) tablet Take 1 tablet by mouth daily.      PATADAY 0.2 % SOLN INSTILL 1 DROP IN BOTH EYES DAILY Refills: 4    tiotropium (SPIRIVA) 18 MCG inhalation capsule Place 1 capsule (18 mcg total) into inhaler and inhale daily. Qty: 30 capsule, Refills: 6    traZODone (DESYREL) 100 MG tablet TAKE 2 TABLETS BY MOUTH AT BEDTIME Qty: 60 tablet, Refills: 0      STOP taking these medications     albuterol (PROAIR HFA) 108 (90 BASE) MCG/ACT inhaler      atorvastatin (LIPITOR) 10 MG tablet        Allergies  Allergen Reactions  . Benzoyl Peroxide Swelling    Swelling at the site of use  . Neomycin-Bacitracin Zn-Polymyx Swelling    Swelling at the site of use   Follow-up Information    Call Leslye Peer., MD.   Specialty:  Pulmonary Disease   Why:  office for follow up details in 1-2 weeks   Contact information:   [address] [place] [address] [phone]       Call Marca Ancona, MD.   Specialty:  Cardiology   Why:  office to confirmed appointment details    Contact information:   [address] [place] [address] [phone]       The results of significant diagnostics from this hospitalization (including imaging, microbiology, ancillary and laboratory) are listed below for reference.    Significant Diagnostic Studies: Dg Chest 2 View  02/28/2015   CLINICAL DATA:  Shortness of breath and lower  extremity pitting edema ; history of atrial fibrillation and asthma  EXAM: CHEST  2 VIEW  COMPARISON:  PA and lateral chest of Jan 17, 2015  FINDINGS: The lungs are mildly hyperinflated. The interstitial markings are increased. The pulmonary vascularity is engorged. The cardiac silhouette is mildly enlarged. A permanent pacemaker is in place with appropriate positioning of the electrodes.  There is no pleural effusion. The bony thorax exhibits no acute abnormality.  IMPRESSION: Reactive airway disease with superimposed CHF and mild interstitial edema. There is no alveolar pneumonia.   Electronically Signed   By: David  [place] M.D.   On: 02/28/2015 14:17   Dg Wrist Complete Right  02/28/2015   CLINICAL DATA:  Wrist pain.  No known injury.  Initial evaluation.  EXAM: RIGHT WRIST - COMPLETE 3+ VIEW  COMPARISON:  None.  FINDINGS: Diffuse osteopenia and degenerative change. No acute bony or joint abnormality identified. No evidence of fracture or dislocation.  IMPRESSION: Negative.   Electronically Signed   By: Maisie Fus  Register   On: 02/28/2015 14:18   Ct Chest High Resolution  03/06/2015   CLINICAL DATA:  80 year old female with increasing shortness of breath over the past year. Clinical concern for interstitial lung disease. History of asthma. Currently on home oxygen.  EXAM: CT CHEST WITHOUT CONTRAST  TECHNIQUE: Multidetector CT imaging of the chest was performed following the standard protocol without intravenous contrast. High resolution imaging of the lungs, as well as inspiratory and expiratory imaging, was performed.  COMPARISON:  Chest CT 11/13/2013.  FINDINGS: Mediastinum/Lymph Nodes: Heart size is mildly enlarged. There is no significant pericardial fluid, thickening or pericardial calcification. There is atherosclerosis of the thoracic aorta, the great vessels of the mediastinum and the coronary arteries, including calcified atherosclerotic plaque in the left main, left anterior descending, left  circumflex and right coronary arteries. Mild calcifications of the aortic valve. Left-sided pacemaker device in position with lead tips terminating in the right atrium and the right ventricle near the apex. Multiple prominent borderline enlarged mediastinal and hilar lymph nodes are noted. Esophagus is unremarkable in appearance. No axillary lymphadenopathy.  Lungs/Pleura: High-resolution images demonstrate extensive patchy areas of ground-glass attenuation asymmetrically distributed in the lungs bilaterally (left greater than right), typically in a peribronchovascular distribution. This is associated with extensive thickening of the adjacent peribronchovascular interstitium, as well as septal thickening and some subpleural reticulation. In addition, there is a more peripherally located area of consolidation and airspace disease in the left upper lobe which is somewhat wedge-shaped. Scattered areas of mild cylindrical bronchiectasis are noted throughout these same regions. No frank honeycombing is confidently identified at this time. No clearly definable craniocaudal gradient, although there is more basilar involvement at this time. No large suspicious appearing pulmonary nodules or masses. Inspiratory and expiratory imaging demonstrates some mild air trapping, indicative of mild small airways disease. No pleural effusions.  Upper Abdomen: Mild diffuse decreased attenuation throughout the hepatic parenchyma, indicative of mild hepatic steatosis. Atherosclerosis.  Musculoskeletal/Soft Tissues: There are no aggressive appearing lytic or blastic lesions noted in the visualized portions of the skeleton.  IMPRESSION: 1. Unusual appearance of the lung parenchyma, as discussed above. These findings could certainly reflect an underlying interstitial lung disease, but the pattern is nonspecific. At this time, findings are favored to reflect nonspecific interstitial pneumonia (NSIP), or potentially cryptogenic organizing  pneumonia (COP). No definite evidence to suggest more aggressive process such as usual interstitial pneumonia (UIP) at this time. 2. The possibility of concurrent or residual infectious airspace consolidation is not excluded, particularly in the periphery of the left upper lobe. Clinical correlation for signs and symptoms of pneumonia is recommended. 3. For further evaluation of these findings, a repeat high-resolution chest CT is suggested in 6-12 months to assess for temporal changes in the appearance of the lung parenchyma if clinically appropriate. 4. Atherosclerosis, including left main and 3 vessel coronary  artery disease. Assessment for potential risk factor modification, dietary therapy or pharmacologic therapy may be warranted, if clinically indicated. 5. Mild hepatic steatosis. 6. Additional incidental findings, as above.   Electronically Signed   By: Trudie Reed M.D.   On: 03/06/2015 14:11   US Venous Img Lower Unilateral Left  02/28/2015   CLINICAL DATA:  Left lower extremity swelling for 2 months.  EXAM: LEFT LOWER EXTREMITY VENOUS DOPPLER ULTRASOUND  TECHNIQUE: Gray-scale sonography with graded compression, as well as color Doppler and duplex ultrasound were performed to evaluate the lower extremity deep venous systems from the level of the common femoral vein and including the common femoral, femoral, profunda femoral, popliteal and calf veins including the posterior tibial, peroneal and gastrocnemius veins when visible. The superficial great saphenous vein was also interrogated. Spectral Doppler was utilized to evaluate flow at rest and with distal augmentation maneuvers in the common femoral, femoral and popliteal veins.  COMPARISON:  None.  FINDINGS: Contralateral Common Femoral Vein: Respiratory phasicity is normal and symmetric with the symptomatic side. No evidence of thrombus. Normal compressibility.  Common Femoral Vein: No evidence of thrombus. Normal compressibility, respiratory  phasicity and response to augmentation.  Saphenofemoral Junction: No evidence of thrombus. Normal compressibility and flow on color Doppler imaging.  Profunda Femoral Vein: No evidence of thrombus. Normal compressibility and flow on color Doppler imaging.  Femoral Vein: No evidence of thrombus. Normal compressibility, respiratory phasicity and response to augmentation.  Popliteal Vein: No evidence of thrombus. Normal compressibility, respiratory phasicity and response to augmentation.  Calf Veins: No evidence of thrombus. Normal compressibility and flow on color Doppler imaging.  Superficial Great Saphenous Vein: No evidence of thrombus. Normal compressibility and flow on color Doppler imaging.  Venous Reflux:  None.  Other Findings:  None.  IMPRESSION: No evidence of deep venous thrombosis.   Electronically Signed   By: Myles Rosenthal M.D.   On: 02/28/2015 17:11    Microbiology: Recent Results (from the past 240 hour(s))  MRSA PCR Screening     Status: None   Collection Time: 02/28/15 11:22 PM  Result Value Ref Range Status   MRSA by PCR NEGATIVE NEGATIVE Final    Comment:        The GeneXpert MRSA Assay (FDA approved for NASAL specimens only), is one component of a comprehensive MRSA colonization surveillance program. It is not intended to diagnose MRSA infection nor to guide or monitor treatment for MRSA infections.      Labs: Basic Metabolic Panel:  Recent Labs Lab 03/04/15 0305 03/05/15 0258 03/06/15 0416 03/07/15 0341 03/08/15 0315  NA 130* 129* 133* 129* 130*  K 3.7 4.4 4.0 5.0 3.6  CL 94* 95* 101 101 99*  CO2 28 25 25  20* 25  GLUCOSE 102* 120* 103* 111* 102*  BUN 23* 25* 22* 23* 19  CREATININE 0.71 0.74 0.60 0.60 0.56  CALCIUM 8.6* 8.5* 8.3* 8.7* 8.5*   Liver Function Tests:  Recent Labs Lab 03/05/15 0258  AST 93*  ALT 83*  ALKPHOS 123  BILITOT 0.8  PROT 7.3  ALBUMIN 2.9*   CBC:  Recent Labs Lab 03/05/15 0258 03/06/15 0416 03/07/15 0341 03/08/15 0315   WBC 11.2* 8.1 10.8* 9.2  HGB 15.6* 14.0 15.5* 13.9  HCT 46.9* 42.6 46.1* 42.3  MCV 86.1 86.9 86.0 87.2  PLT 371 336 308 331    BNP (last 3 results)  Recent Labs  02/28/15 1354 03/01/15 0108  BNP 393.9* 491.9*    Signed:  Vassie Loll  [facility] 03/08/2015, 2:20 PM

## 2015-03-08 NOTE — Progress Notes (Signed)
Subjective:    Breathing stable overnight. Lasix 20 mg daily restarted yesterday. Anti-CCP antibody not yet resulted. Net even I's/O's.   Objective:   Weight Range: 124 lb 7.9 oz (56.469 kg) Body mass index is 25.13 kg/(m^2).   Vital Signs:   Temp:  [97.5 F (36.4 C)-97.9 F (36.6 C)] 97.5 F (36.4 C) (07/16 0621) Pulse Rate:  [80-88] 88 (07/16 0621) Resp:  [18-20] 20 (07/16 0621) BP: (96-99)/(43-69) 97/61 mmHg (07/16 0621) SpO2:  [93 %-100 %] 100 % (07/16 0819) FiO2 (%):  [40 %] 40 % (07/16 0819) Weight:  [124 lb 7.9 oz (56.469 kg)] 124 lb 7.9 oz (56.469 kg) (07/16 0621) Last BM Date: 03/07/15  Weight change: Filed Weights   03/06/15 0511 03/07/15 0430 03/08/15 0621  Weight: 124 lb 3.2 oz (56.337 kg) 124 lb 9.6 oz (56.518 kg) 124 lb 7.9 oz (56.469 kg)    Intake/Output:   Intake/Output Summary (Last 24 hours) at 03/08/15 1121 Last data filed at 03/08/15 1057  Gross per 24 hour  Intake    480 ml  Output   1101 ml  Net   -621 ml     Physical Exam: General: Pleasant, NAD, on oxygen by nasal cannula Neuro: Alert and oriented X 3. Moves all extremities spontaneously. Psych: Normal affect. HEENT: Normal Neck: Supple without bruits. JVP 8 Lungs: Resp regular and unlabored, CTA. Heart: RRR no s3, s4, or murmurs appreciated Abdomen: Soft, non-tender, non-distended, BS + x 4.  Extremities: No clubbing, cyanosis or edema. DP/PT/Radials 2+ and equal bilaterally.  Telemetry:  Afib, rate controlled in 60s  Labs: CBC  Recent Labs  03/07/15 0341 03/08/15 0315  WBC 10.8* 9.2  HGB 15.5* 13.9  HCT 46.1* 42.3  MCV 86.0 87.2  PLT 308 331   Basic Metabolic Panel  Recent Labs  03/07/15 0341 03/08/15 0315  NA 129* 130*  K 5.0 3.6  CL 101 99*  CO2 20* 25  GLUCOSE 111* 102*  BUN 23* 19  CALCIUM 8.7* 8.5*   Liver Function Tests No results for input(s): AST, ALT, ALKPHOS, BILITOT, PROT, ALBUMIN in the last 72 hours. No results for input(s):  LIPASE, AMYLASE in the last 72 hours. Cardiac Enzymes No results for input(s): CKTOTAL, CKMB, CKMBINDEX, TROPONINI in the last 72 hours.  BNP: BNP (last 3 results)  Recent Labs  02/28/15 1354 03/01/15 0108  BNP 393.9* 491.9*    ProBNP (last 3 results) No results for input(s): PROBNP in the last 8760 hours.   D-Dimer No results for input(s): DDIMER in the last 72 hours. Hemoglobin A1C No results for input(s): HGBA1C in the last 72 hours. Fasting Lipid Panel No results for input(s): CHOL, HDL, LDLCALC, TRIG, CHOLHDL, LDLDIRECT in the last 72 hours. Thyroid Function Tests No results for input(s): TSH, T4TOTAL, T3FREE, THYROIDAB in the last 72 hours.  Invalid input(s): FREET3  Other results:     Imaging/Studies:  Ct Chest High Resolution  03/06/2015   CLINICAL DATA:  80 year old female with increasing shortness of breath over the past year. Clinical concern for interstitial lung disease. History of asthma. Currently on home oxygen.  EXAM: CT CHEST WITHOUT CONTRAST  TECHNIQUE: Multidetector CT imaging of the chest was performed following the standard protocol without intravenous contrast. High resolution imaging of the lungs, as well as inspiratory and expiratory imaging, was performed.  COMPARISON:  Chest CT 11/13/2013.  FINDINGS: Mediastinum/Lymph Nodes: Heart size is mildly enlarged. There is no significant pericardial fluid, thickening or pericardial calcification. There  is atherosclerosis of the thoracic aorta, the great vessels of the mediastinum and the coronary arteries, including calcified atherosclerotic plaque in the left main, left anterior descending, left circumflex and right coronary arteries. Mild calcifications of the aortic valve. Left-sided pacemaker device in position with lead tips terminating in the right atrium and the right ventricle near the apex. Multiple prominent borderline enlarged mediastinal and hilar lymph nodes are noted. Esophagus is unremarkable in  appearance. No axillary lymphadenopathy.  Lungs/Pleura: High-resolution images demonstrate extensive patchy areas of ground-glass attenuation asymmetrically distributed in the lungs bilaterally (left greater than right), typically in a peribronchovascular distribution. This is associated with extensive thickening of the adjacent peribronchovascular interstitium, as well as septal thickening and some subpleural reticulation. In addition, there is a more peripherally located area of consolidation and airspace disease in the left upper lobe which is somewhat wedge-shaped. Scattered areas of mild cylindrical bronchiectasis are noted throughout these same regions. No frank honeycombing is confidently identified at this time. No clearly definable craniocaudal gradient, although there is more basilar involvement at this time. No large suspicious appearing pulmonary nodules or masses. Inspiratory and expiratory imaging demonstrates some mild air trapping, indicative of mild small airways disease. No pleural effusions.  Upper Abdomen: Mild diffuse decreased attenuation throughout the hepatic parenchyma, indicative of mild hepatic steatosis. Atherosclerosis.  Musculoskeletal/Soft Tissues: There are no aggressive appearing lytic or blastic lesions noted in the visualized portions of the skeleton.  IMPRESSION: 1. Unusual appearance of the lung parenchyma, as discussed above. These findings could certainly reflect an underlying interstitial lung disease, but the pattern is nonspecific. At this time, findings are favored to reflect nonspecific interstitial pneumonia (NSIP), or potentially cryptogenic organizing pneumonia (COP). No definite evidence to suggest more aggressive process such as usual interstitial pneumonia (UIP) at this time. 2. The possibility of concurrent or residual infectious airspace consolidation is not excluded, particularly in the periphery of the left upper lobe. Clinical correlation for signs and symptoms  of pneumonia is recommended. 3. For further evaluation of these findings, a repeat high-resolution chest CT is suggested in 6-12 months to assess for temporal changes in the appearance of the lung parenchyma if clinically appropriate. 4. Atherosclerosis, including left main and 3 vessel coronary artery disease. Assessment for potential risk factor modification, dietary therapy or pharmacologic therapy may be warranted, if clinically indicated. 5. Mild hepatic steatosis. 6. Additional incidental findings, as above.   Electronically Signed   By: Trudie Reed M.D.   On: 03/06/2015 14:11    Latest Echo LV EF: 55% -  60%  ------------------------------------------------------------------- Indications:   (R06.00).  ------------------------------------------------------------------- History:  PMH: Edema. Acquired from the patient and from the patient&'s chart. Dyspnea. Atrial fibrillation. Chronic obstructive pulmonary disease. Risk factors: Dyslipidemia.  ------------------------------------------------------------------- Study Conclusions  - Left ventricle: The cavity size was normal. Wall thickness was normal. Systolic function was normal. The estimated ejection fraction was in the range of 55% to 60%. Wall motion was normal; there were no regional wall motion abnormalities. - Left atrium: The atrium was mildly dilated. - Right atrium: The atrium was mildly dilated. - Pulmonary arteries: Systolic pressure was moderately increased. PA peak pressure: 66 mm Hg (S).  Latest Cath  RHC/LHC (7/13):  Hemodynamics (mmHg) RA mean 1 RV 43/1 PA 52/19, mean 31 PCWP mean 6 LV 94/1 AO 106/61 Cardiac Output (Fick) 3.11  Cardiac Index (Fick) 2.09 PVR 8 WU Cardiac Output (Thermo) 2.74 Cardiac Index (Thermo) 1.84  PVR 9.1 WU Left Main  Short, no significant disease.  Left Anterior Descending  50% napkin ring-like stenosis in the proximal LAD. 50% ostial D2  (moderate vessel).     Left Circumflex  Luminal irregularities.     Right Coronary Artery  Small, co-dominant vessel with 60% stenosis in the mid-portion (similar to prior study).           Medications:     Scheduled Medications: . antiseptic oral rinse  7 mL Mouth Rinse q12n4p  . atorvastatin  10 mg Oral q1800  . budesonide (PULMICORT) nebulizer solution  0.25 mg Nebulization BID  . chlorhexidine  15 mL Mouth Rinse BID  . doxycycline  100 mg Oral BID  . escitalopram  10 mg Oral Daily  . feeding supplement (PRO-STAT SUGAR FREE 64)  30 mL Oral Daily  . fluticasone  2 spray Each Nare Daily  . furosemide  20 mg Oral Daily  . lisinopril  2.5 mg Oral Daily  . loratadine  10 mg Oral Daily  . multivitamin with minerals  1 tablet Oral Daily  . pantoprazole sodium  80 mg Per Tube Daily  . sodium chloride  3 mL Intravenous Q12H  . sodium chloride  3 mL Intravenous Q12H  . Tadalafil (PAH)  20 mg Oral Daily  . tiotropium  18 mcg Inhalation Daily  . traZODone  200 mg Oral QHS  . Warfarin - Pharmacist Dosing Inpatient   Does not apply q1800    Infusions: . sodium chloride Stopped (03/05/15 1900)    PRN Medications: sodium chloride, sodium chloride, acetaminophen, albuterol, ondansetron (ZOFRAN) IV, sodium chloride, sodium chloride   Assessment/Plan    1. Acute on chronic diastolic CHF: EF 75-30% on 5/16 echo with PASP 66 mmHg.  - Concern complicated by PAH  is playing a large role in her presentation.  - Appears to have low fluid on board but remains SOB with ambulation - Filling pressures low on RHC 03/05/15 - Continue lasix 20 mg daily.  2. Pulmonary hypertension:  -  Significant PAH with marginal cardiac output (albeit in setting of hypovolemia) on RHC, possibly group 1 related to rheumatological disease.  - RF positive; ANA weakly positive ( negative anti-dsDNA).  - Hx of bronchiectasis with mild obstructive PFTs.  - CT chest 3/15 did not show ILD but  she is much more symptomatic now.  - V/Q scan in 5/16 negative for PE.  - Needs rheum workup: ?RA => will send CCP antibody (re-order, does not appear to have been sent yesterday).  - ?ILD related to rheumatological disease, ?RA => will need high resolution non-contrast chest CT (has been ordered). ?ILD may explain some of her dyspnea - On tadalafil 20 daily (mild transaminitis so will start this first instead of ambrisentan). Based on most recent trial data, would like to get her on ERA soon as well.  3. CAD: Moderate CAD on coronary angiography on 7/13.  - Continue atorvastatin. Not on ASA as she has been on warfarin.  4. Atrial fibrillation: Permanent it appears at this point.  - HR not elevated, not on rate control meds.  - Warfarin resumed. Dosing per pharm 5. PPM: MDT, h/o SN dysfunction.  6. H/o Takotsubo CMP: resolved.   Ok to d/c to [facility] today from a cardiology standpoint. Follow-up with Dr. Shirlee Latch in the heart failure clinic.  Chrystie Nose, MD, [facility] Attending Cardiologist [facility]  [facility]  03/08/2015, 11:21 AM

## 2015-03-08 NOTE — Clinical Social Work Placement (Signed)
   CLINICAL SOCIAL WORK PLACEMENT  NOTE  Date:  03/08/2015  Patient Details  Name: [name] MRN: [id number] Date of Birth: Feb 06, 1935  Clinical Social Work is seeking post-discharge placement for this patient at the Skilled  Nursing Facility level of care (*CSW will initial, date and re-position this form in  chart as items are completed):  Yes   Patient/family provided with [facility] Clinical Social Work Department's list of facilities offering this level of care within the geographic area requested by the patient (or if unable, by the patient's family).  Yes   Patient/family informed of their freedom to choose among providers that offer the needed level of care, that participate in Medicare, Medicaid or managed care program needed by the patient, have an available bed and are willing to accept the patient.  Yes   Patient/family informed of [facility]'s ownership interest in [facility] and [facility], as well as of the fact that they are under no obligation to receive care at these facilities.  PASRR submitted to EDS on 03/05/15     PASRR number received on 03/05/15     Existing PASRR number confirmed on       FL2 transmitted to all facilities in geographic area requested by pt/family on 03/05/15     FL2 transmitted to all facilities within larger geographic area on       Patient informed that his/her managed care company has contracts with or will negotiate with certain facilities, including the following:   ([employer] Medicare ([employer])     Yes   Patient/family informed of bed offers received.  Patient chooses bed at [facility]     Physician recommends and patient chooses bed at      Patient to be transferred to [facility] on 03/08/15.  Patient to be transferred to facility by Ambulance [facility])     Patient family notified on 03/08/15 of transfer.  Name of family member notified:  [name] (husband)     PHYSICIAN        Additional Comment:   Harless Nakayama Weekend CSW [phone]

## 2015-03-08 NOTE — Significant Event (Signed)
Report given given to [facility]. Patient to be transfer via [facility] . Patient belongings transported  by husband .

## 2015-03-08 NOTE — Progress Notes (Signed)
Name: [name] MRN: [id number] DOB: [birth year]    ADMISSION DATE:  02/28/2015 CONSULTATION DATE:  03/08/2015  REFERRING MD :  David Stall  CHIEF COMPLAINT:  SOB  BRIEF PATIENT DESCRIPTION: 80 y.o. woman followed by Dr Delton Coombes long term for mild obstructive lung disease, bronchiectasis and cough. 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 She is followed at [facility] cardiology for diastolic CHF and pacemaker placement for ablated A Flutter.   Over the last 12 months she has developed progressive exertional dyspnea and hypoxemia, ultimately requiring initiation of home O2. Her evaluation to date has been significant for PAH, TTE 12/25/14 showed LEVEF 55 - 60%, mild bilateral atrial enlargement, PASP 66 mmHg (increased from 38 mmHg 2 years prior). CT chest has not shown ILD (11/13/13), V/q without evidence chronic VTE (12/2014). Her RF and ANA are both elevated.   She was admitted with progressive dyspnea, hypoxemia, some mild increase in B interstitial infiltrates after she stopped taking her home lasix for a few days.   SIGNIFICANT EVENTS  7/8 - admitted. 7/10 - PCCM consulted.  STUDIES:  CXR 7/8 >>> reactive airway dz with superimposed CHF and mild edema. L and R heart cath 7/13 >> PAP 52/19 (31), PAOP (6), PVR 8-9, RVP 43/1 HRCT Chest 7/14 >> patchy areas of groundglass attenuation asymmetrically distributed left greater than right in a peri-bronchovascular distribution. There is also some subpleural reticulation with peripherally located consolidation in the left upper lobe that appears more wedge-shaped. Mild bronchiectasis is noted throughout these associated regions. No honeycombing. Mild air trapping is seen on end inspiratory and expiratory imaging. No pleural effusion.  SUBJECTIVE:  Patient reports her dyspnea is largely unchanged. Denies any significant cough. She is now really only performing transfers to bedside commode in the room.  Denies any chest pain or pressure. Denies any significant nausea or vomiting.  ROS: No fever, chills, or sweats. No headache or vision changes.  VITAL SIGNS: Temp:  [97.5 F (36.4 C)-97.8 F (36.6 C)] 97.5 F (36.4 C) (07/16 0621) Pulse Rate:  [80-88] 88 (07/16 0621) Resp:  [18-20] 20 (07/16 0621) BP: (97-99)/(54-69) 97/61 mmHg (07/16 0621) SpO2:  [94 %-100 %] 100 % (07/16 0819) FiO2 (%):  [40 %] 40 % (07/16 0819) Weight:  [124 lb 7.9 oz (56.469 kg)] 124 lb 7.9 oz (56.469 kg) (07/16 5701)  PHYSICAL EXAMINATION: General:  Awake. Alert. No acute distress. Sitting watching TV. with husband at bedside.  Integument:  Warm & dry. No rash on exposed skin.  HEENT:  Moist mucus membranes. No oral ulcers. No scleral injection or icterus.  Cardiovascular:  Regular rate. No edema. No appreciable JVD.  Pulmonary:  Mild crackles bilateral lung bases. Symmetric chest wall expansion. Speaking in complete sentences with only minimally increased work of breathing. Abdomen: Soft. Normal bowel sounds. Nondistended. Grossly nontender. Neurological:  CN 2-12 grossly in tact. No meningismus. Moving all 4 extremities equally.     Recent Labs Lab 03/06/15 0416 03/07/15 0341 03/08/15 0315  NA 133* 129* 130*  K 4.0 5.0 3.6  CL 101 101 99*  CO2 25 20* 25  BUN 22* 23* 19  CREATININE 0.60 0.60 0.56  GLUCOSE 103* 111* 102*    Recent Labs Lab 03/06/15 0416 03/07/15 0341 03/08/15 0315  HGB 14.0 15.5* 13.9  HCT 42.6 46.1* 42.3  WBC 8.1 10.8* 9.2  PLT 336 308 331   No results found.  ASSESSMENT / PLAN: 1. Acute on chronic hypoxic respiratory failure:  Secondary to severe pulmonary arterial hypertension. Continuing to wean FiO2 to maintain saturations greater than 94%.  2. Severe pulmonary arterial hypertension:  PASP 66 by echo 12/25/14. Primarily grouped 1 is a manifestation of new connective tissue disease. Started on tadalafil by Dr. Delton Coombes. Our office is going to begin paperwork for the  medication from specialty pharmacy with goal dose of 40 mg daily.  3. History of bronchiectasis & mild asthma: No evidence of acute exacerbation. Continuing Spiriva & Pulmicort (substitution for Qvar on hospital formulary).  4. ILD: Asymmetric involvement with L >R. This appears mild on imaging. Likely related to patient's underlying connective tissue disease. This will be further discussed with Dr. Delton Coombes.  5. History of diastolic congestive heart failure: No signs of exacerbation or decompensation. Currently on Lasix 20 mg by mouth daily & lisinopril.  6. History of atrial fibrillation/flutter: Status post ablation and now with paced rhythm. Currently on Coumadin.  Donna Christen Jamison Neighbor, M.D. [facility] Pager:  [phone] After 3pm or if no response, call [phone]

## 2015-03-08 NOTE — Progress Notes (Signed)
CSW (Clinical Social Worker) prepared pt dc packet and placed with shadow chart. CSW arranged non-emergent ambulance transport. Pt, pt family, pt nurse, and facility informed. CSW signing off.  Sadako Cegielski, LCSWA Weekend CSW [phone]   

## 2015-03-08 NOTE — Progress Notes (Signed)
ANTICOAGULATION CONSULT NOTE - Follow Up Consult  Pharmacy Consult for Warfarin Indication: atrial fibrillation  Allergies  Allergen Reactions  . Benzoyl Peroxide Swelling    Swelling at the site of use  . Neomycin-Bacitracin Zn-Polymyx Swelling    Swelling at the site of use    Patient Measurements: Height: 4\' 11"  (149.9 cm) Weight: 124 lb 7.9 oz (56.469 kg) (Scale A) IBW/kg (Calculated) : 43.2   Vital Signs: Temp: 97.5 F (36.4 C) (07/16 0621) Temp Source: Oral (07/16 0621) BP: 97/61 mmHg (07/16 0621) Pulse Rate: 88 (07/16 0621)  Labs:  Recent Labs  03/06/15 0416 03/07/15 0341 03/08/15 0315  HGB 14.0 15.5* 13.9  HCT 42.6 46.1* 42.3  PLT 336 308 331  LABPROT 17.9* 24.5* 32.0*  INR 1.47 2.23* 3.19*  CREATININE 0.60 0.60 0.56    Estimated Creatinine Clearance: 45.1 mL/min (by C-G formula based on Cr of 0.56).   Medications:  Scheduled:  . antiseptic oral rinse  7 mL Mouth Rinse q12n4p  . atorvastatin  10 mg Oral q1800  . budesonide (PULMICORT) nebulizer solution  0.25 mg Nebulization BID  . chlorhexidine  15 mL Mouth Rinse BID  . doxycycline  100 mg Oral BID  . escitalopram  10 mg Oral Daily  . feeding supplement (PRO-STAT SUGAR FREE 64)  30 mL Oral Daily  . fluticasone  2 spray Each Nare Daily  . furosemide  20 mg Oral Daily  . lisinopril  2.5 mg Oral Daily  . loratadine  10 mg Oral Daily  . multivitamin with minerals  1 tablet Oral Daily  . pantoprazole sodium  80 mg Per Tube Daily  . sodium chloride  3 mL Intravenous Q12H  . sodium chloride  3 mL Intravenous Q12H  . Tadalafil (PAH)  20 mg Oral Daily  . tiotropium  18 mcg Inhalation Daily  . traZODone  200 mg Oral QHS  . Warfarin - Pharmacist Dosing Inpatient   Does not apply q1800   Infusions:  . sodium chloride Stopped (03/05/15 1900)   PRN: sodium chloride, sodium chloride, acetaminophen, albuterol, ondansetron (ZOFRAN) IV, sodium chloride, sodium chloride  Assessment: 79 YOF admitted on  02/28/15 with acute on chronic respiratory failure/cellulitis, takes warfarin PTA (Warfarin 2.5 mg on Tues/Thurs/Sun, 1.25 mg all other days from anticoag outpatient notes). Spoke with pt about home dose and is taking 2.5 mg T/Th, 1.25 mg AOD.  S/p cath and restarted warfarin on 7/13.  7/16 INR slightly supratherapeutic at 3.19 following Warfarin 4 mg > 2.5 mg > 1 mg. No s/sx of bleeding noted. Hgb stable 13.9, Plts WNL and stable.  Goal of Therapy:  INR 2-3 Monitor platelets by anticoagulation protocol: Yes   Plan:  Hold warfarin x 1 day.  Follow-up INR tomorrow. Daily INR/PT and CBC Monitor for s/sx of bleeding   Synetta Fail, PharmD Pharmacy Resident 03/08/2015,1:56 PM

## 2015-03-10 ENCOUNTER — Telehealth: Payer: Self-pay | Admitting: *Deleted

## 2015-03-10 ENCOUNTER — Other Ambulatory Visit: Payer: Self-pay | Admitting: *Deleted

## 2015-03-10 NOTE — Telephone Encounter (Signed)
Unable to reach patient at time of TCM Call.  Left message for patient to return call when available.   (patient appears to be in SNF for rehab)

## 2015-03-10 NOTE — Patient Outreach (Signed)
[facility]) Care Management  03/10/2015  [name] 09/21/1934 [id number]   Received referral from hospital liaison for patient. Per note, patient was transferred to [facility] on 03/08/15. Left message to [facility] LCSW to contact this care management coordinator about patient's plan for discharge from Skilled nursing facility ([facility]) so that Transition of care will be initiated then. Will await [facility] LCSW return call for update on patient.   Geneive Sandstrom A. Maricela Kawahara, BSN, RN-BC [facility] Community Care Management Coordinator Cell: [phone]

## 2015-03-11 ENCOUNTER — Encounter: Payer: Commercial Managed Care - HMO | Admitting: *Deleted

## 2015-03-11 ENCOUNTER — Telehealth: Payer: Self-pay | Admitting: *Deleted

## 2015-03-11 ENCOUNTER — Telehealth: Payer: Self-pay | Admitting: Cardiology

## 2015-03-11 NOTE — Telephone Encounter (Signed)
Unable to reach patient at time of TCM Call.  Left message for patient to return call when available.   (Patient in SNF rehab)

## 2015-03-11 NOTE — Telephone Encounter (Signed)
LMOVM reminding pt to send remote transmission.   

## 2015-03-12 ENCOUNTER — Encounter: Payer: Self-pay | Admitting: Cardiology

## 2015-03-13 LAB — MISCELLANEOUS TEST

## 2015-03-14 ENCOUNTER — Other Ambulatory Visit: Payer: Commercial Managed Care - HMO | Admitting: *Deleted

## 2015-03-17 ENCOUNTER — Telehealth (HOSPITAL_COMMUNITY): Payer: Self-pay | Admitting: *Deleted

## 2015-03-17 ENCOUNTER — Other Ambulatory Visit: Payer: Self-pay | Admitting: Family Medicine

## 2015-03-17 NOTE — Patient Outreach (Signed)
[facility]) Care Management  03/17/2015  [name] August 17, 1935 [id number]   CSW had scheduled an appointment to meet with patient today; however, patient was working with physical and occupational therapists at the time of CSW's arrival.  CSW was informed that patient would be preoccupied for several hours.  CSW left a HIPAA compliant note at patient's bedside, explaining that CSW would be in touch to try and reschedule the visit.  Danford Bad, BSW, MSW, LCSW [facility] [address] [place], [address] [place], [place] [address] [email or web address] [phone]

## 2015-03-17 NOTE — Telephone Encounter (Signed)
Completed PA on Letairis 5 mg through [place], med approved through 03/14/17

## 2015-03-20 ENCOUNTER — Encounter: Payer: Self-pay | Admitting: *Deleted

## 2015-03-20 ENCOUNTER — Other Ambulatory Visit: Payer: Self-pay | Admitting: *Deleted

## 2015-03-20 NOTE — Patient Outreach (Signed)
[facility]) Care Management  [facility] Social Work  03/20/2015  [name] [birth year] [id number]   Current Medications:  Current Outpatient Prescriptions  Medication Sig Dispense Refill  . albuterol (PROVENTIL) (2.5 MG/3ML) 0.083% nebulizer solution Take 3 mLs by nebulization every 4 (four) hours as needed for wheezing or shortness of breath. 75 mL 12  . Amino Acids-Protein Hydrolys (FEEDING SUPPLEMENT, PRO-STAT SUGAR FREE 64,) LIQD Take 30 mLs by mouth 2 (two) times daily.    . Ascorbic Acid (VITAMIN C) 1000 MG tablet Take 1,000 mg by mouth daily.      . beclomethasone (QVAR) 80 MCG/ACT inhaler Inhale 2 puffs into the lungs 2 (two) times daily. 1 Inhaler 6  . BEPREVE 1.5 % SOLN Place 1 drop into both eyes at bedtime.     . budesonide (RHINOCORT AQUA) 32 MCG/ACT nasal spray Place 1 spray into both nostrils daily. 1 Bottle 6  . Calcium Carbonate (CALCIUM 600) 1500 MG TABS Take 1 tablet by mouth daily.      . Cholecalciferol (VITAMIN D3) 1000 UNITS CAPS Take 1 capsule by mouth daily.      [name] escitalopram (LEXAPRO) 10 MG tablet TAKE 1 TABLET (10 MG TOTAL) BY MOUTH DAILY. 30 tablet 5  . esomeprazole (NEXIUM) 40 MG capsule TAKE ONE CAPSULE BY MOUTH EVERY DAY BEFORE BREAKFAST (Patient taking differently: Take 40 mg by mouth at bedtime. ) 30 capsule 5  . fluticasone (FLONASE) 50 MCG/ACT nasal spray Place 2 sprays into both nostrils 2 (two) times daily. 16 g 11  . furosemide (LASIX) 20 MG tablet Take 1 tablet (20 mg total) by mouth daily.    [name] lisinopril (PRINIVIL,ZESTRIL) 2.5 MG tablet Take 1 tablet (2.5 mg total) by mouth daily.    [name] loratadine (CLARITIN) 10 MG tablet Take 10 mg by mouth daily.    . Multiple Vitamin (MULTIVITAMIN) tablet Take 1 tablet by mouth daily.      [name] PATADAY 0.2 % SOLN INSTILL 1 DROP IN BOTH EYES DAILY  4  . Tadalafil, PAH, 20 MG TABS Take 1 tablet (20 mg total) by mouth daily.    [name] tiotropium (SPIRIVA) 18 MCG inhalation capsule Place 1 capsule (18 mcg  total) into inhaler and inhale daily. 30 capsule 6  . traZODone (DESYREL) 100 MG tablet TAKE 2 TABLETS BY MOUTH AT BEDTIME 60 tablet 0  . warfarin (COUMADIN) 2.5 MG tablet Take 1 tablet (2.5 mg total) by mouth daily at 6 PM. 2.5 mg on Tues and Thurs.  1.25 mg all other days. 30 tablet 3  . [DISCONTINUED] fexofenadine (ALLEGRA) 180 MG tablet Take 180 mg by mouth daily.      No current facility-administered medications for this visit.    Functional Status:  In your present state of health, do you have any difficulty performing the following activities: 03/20/2015 02/28/2015  Hearing? N N  Vision? N N  Difficulty concentrating or making decisions? N N  Walking or climbing stairs? Y Y  Dressing or bathing? Y Y  Doing errands, shopping? N N  Preparing Food and eating ? N -  Using the Toilet? N -  In the past six months, have you accidently leaked urine? N -  Do you have problems with loss of bowel control? N -  Managing your Medications? N -  Managing your Finances? N -  Housekeeping or managing your Housekeeping? Y -    Fall/Depression Screening:  [facility] 2/9 Scores 03/20/2015 02/03/2015 02/03/2015 07/09/2013 07/09/2013  PHQ - 2  Score 0 0 0 0 0    Assessment:   CSW was able to meet with patient, patient's husband, [name], physical therapist, occupational therapist, attending nurse, Licensed Clinical Social Worker/Admissions Coordinator, [name] and Loss adjuster, chartered, all with [facility], today to perform patient's Plan of Care Meeting. The therapy department is pleased with patient's progress, commenting "[name] is progressing well, better than expected actually".  Patient will require at least two more weeks of therapy before being able to return home to live with [name].  A Discharge Planning Meeting is scheduled for Tuesday, August 2nd at 8:30am to nail down an exact discharge date or patient.  Also during this meeting, home care services will be  arranged and durable medical equipment ordered, via an agency of patient's choice.  [name] is aware that CSW will not be in attendance at the meeting on August 2nd, as CSW will be out-of-the-office.  [name] agreed to follow-up with CSW upon CSW's return to the office to report findings.   CSW is aware that patient was recently started on [name] 5 mg to try and treat patient's oxygen intake, due to diagnosis of Pulmonary Hypertension, Pulmonary Arterial Hypertension and Atrial Fibrillation with Slow Ventricular Response.  Patient becomes extremely short of breath while walking short distances.  Patient is hopeful that she will see significant results within the next four months.  Patient's [employer] has approved the medication.   Plan:   CSW will contact patient on August 9th to ensure that patient had a smooth transition from [facility] to home. CSW will assess need for continued social work involvement.  Danford Bad, BSW, MSW, LCSW [facility] [address] [place], [address] [place], [place] [address] [email or web address] [phone]

## 2015-03-24 ENCOUNTER — Telehealth: Payer: Self-pay | Admitting: Emergency Medicine

## 2015-03-24 NOTE — Telephone Encounter (Signed)
Spoke with the pt's spouse  He states that [name] had discussed pt starting on letairis 5 mg  He received letter from [facility] stating that this med will be approved  [name] is it okay to call this in? Please advise

## 2015-03-24 NOTE — Telephone Encounter (Signed)
LMTCB

## 2015-03-24 NOTE — Telephone Encounter (Signed)
pts husband returned call  [phone]

## 2015-03-25 ENCOUNTER — Emergency Department (HOSPITAL_COMMUNITY): Payer: Commercial Managed Care - HMO

## 2015-03-25 ENCOUNTER — Telehealth: Payer: Self-pay

## 2015-03-25 ENCOUNTER — Encounter (HOSPITAL_COMMUNITY): Payer: Self-pay | Admitting: Emergency Medicine

## 2015-03-25 ENCOUNTER — Inpatient Hospital Stay (HOSPITAL_COMMUNITY)
Admission: EM | Admit: 2015-03-25 | Discharge: 2015-04-03 | DRG: 871 | Disposition: A | Discharge status: Hospice | Payer: Commercial Managed Care - HMO | Attending: Pulmonary Disease | Admitting: Pulmonary Disease

## 2015-03-25 DIAGNOSIS — I482 Chronic atrial fibrillation: Secondary | ICD-10-CM | POA: Diagnosis not present

## 2015-03-25 DIAGNOSIS — T380X5A Adverse effect of glucocorticoids and synthetic analogues, initial encounter: Secondary | ICD-10-CM | POA: Diagnosis not present

## 2015-03-25 DIAGNOSIS — M1990 Unspecified osteoarthritis, unspecified site: Secondary | ICD-10-CM | POA: Diagnosis present

## 2015-03-25 DIAGNOSIS — J810 Acute pulmonary edema: Secondary | ICD-10-CM | POA: Diagnosis not present

## 2015-03-25 DIAGNOSIS — E440 Moderate protein-calorie malnutrition: Secondary | ICD-10-CM | POA: Diagnosis present

## 2015-03-25 DIAGNOSIS — J45909 Unspecified asthma, uncomplicated: Secondary | ICD-10-CM | POA: Diagnosis present

## 2015-03-25 DIAGNOSIS — A419 Sepsis, unspecified organism: Secondary | ICD-10-CM | POA: Diagnosis present

## 2015-03-25 DIAGNOSIS — B965 Pseudomonas (aeruginosa) (mallei) (pseudomallei) as the cause of diseases classified elsewhere: Secondary | ICD-10-CM | POA: Diagnosis present

## 2015-03-25 DIAGNOSIS — Z9981 Dependence on supplemental oxygen: Secondary | ICD-10-CM | POA: Diagnosis not present

## 2015-03-25 DIAGNOSIS — R739 Hyperglycemia, unspecified: Secondary | ICD-10-CM | POA: Diagnosis present

## 2015-03-25 DIAGNOSIS — Z6820 Body mass index (BMI) 20.0-20.9, adult: Secondary | ICD-10-CM

## 2015-03-25 DIAGNOSIS — I270 Primary pulmonary hypertension: Secondary | ICD-10-CM | POA: Diagnosis not present

## 2015-03-25 DIAGNOSIS — L8990 Pressure ulcer of unspecified site, unspecified stage: Secondary | ICD-10-CM | POA: Diagnosis not present

## 2015-03-25 DIAGNOSIS — F329 Major depressive disorder, single episode, unspecified: Secondary | ICD-10-CM | POA: Diagnosis present

## 2015-03-25 DIAGNOSIS — J9620 Acute and chronic respiratory failure, unspecified whether with hypoxia or hypercapnia: Secondary | ICD-10-CM | POA: Diagnosis not present

## 2015-03-25 DIAGNOSIS — K219 Gastro-esophageal reflux disease without esophagitis: Secondary | ICD-10-CM | POA: Diagnosis present

## 2015-03-25 DIAGNOSIS — R791 Abnormal coagulation profile: Secondary | ICD-10-CM | POA: Diagnosis present

## 2015-03-25 DIAGNOSIS — J310 Chronic rhinitis: Secondary | ICD-10-CM | POA: Diagnosis present

## 2015-03-25 DIAGNOSIS — F419 Anxiety disorder, unspecified: Secondary | ICD-10-CM | POA: Diagnosis present

## 2015-03-25 DIAGNOSIS — J9600 Acute respiratory failure, unspecified whether with hypoxia or hypercapnia: Secondary | ICD-10-CM

## 2015-03-25 DIAGNOSIS — J849 Interstitial pulmonary disease, unspecified: Secondary | ICD-10-CM | POA: Diagnosis not present

## 2015-03-25 DIAGNOSIS — Z950 Presence of cardiac pacemaker: Secondary | ICD-10-CM | POA: Diagnosis not present

## 2015-03-25 DIAGNOSIS — I10 Essential (primary) hypertension: Secondary | ICD-10-CM | POA: Diagnosis present

## 2015-03-25 DIAGNOSIS — Z66 Do not resuscitate: Secondary | ICD-10-CM | POA: Diagnosis present

## 2015-03-25 DIAGNOSIS — E861 Hypovolemia: Secondary | ICD-10-CM | POA: Diagnosis present

## 2015-03-25 DIAGNOSIS — I2510 Atherosclerotic heart disease of native coronary artery without angina pectoris: Secondary | ICD-10-CM | POA: Diagnosis present

## 2015-03-25 DIAGNOSIS — Z7901 Long term (current) use of anticoagulants: Secondary | ICD-10-CM | POA: Diagnosis not present

## 2015-03-25 DIAGNOSIS — G4700 Insomnia, unspecified: Secondary | ICD-10-CM | POA: Diagnosis not present

## 2015-03-25 DIAGNOSIS — E871 Hypo-osmolality and hyponatremia: Secondary | ICD-10-CM | POA: Diagnosis not present

## 2015-03-25 DIAGNOSIS — J84112 Idiopathic pulmonary fibrosis: Secondary | ICD-10-CM | POA: Diagnosis present

## 2015-03-25 DIAGNOSIS — J811 Chronic pulmonary edema: Secondary | ICD-10-CM

## 2015-03-25 DIAGNOSIS — J9690 Respiratory failure, unspecified, unspecified whether with hypoxia or hypercapnia: Secondary | ICD-10-CM

## 2015-03-25 DIAGNOSIS — Z8249 Family history of ischemic heart disease and other diseases of the circulatory system: Secondary | ICD-10-CM

## 2015-03-25 DIAGNOSIS — A481 Legionnaires' disease: Secondary | ICD-10-CM | POA: Diagnosis present

## 2015-03-25 DIAGNOSIS — Z515 Encounter for palliative care: Secondary | ICD-10-CM | POA: Diagnosis not present

## 2015-03-25 DIAGNOSIS — J9621 Acute and chronic respiratory failure with hypoxia: Secondary | ICD-10-CM | POA: Diagnosis not present

## 2015-03-25 DIAGNOSIS — I272 Other secondary pulmonary hypertension: Secondary | ICD-10-CM | POA: Diagnosis not present

## 2015-03-25 DIAGNOSIS — T378X5A Adverse effect of other specified systemic anti-infectives and antiparasitics, initial encounter: Secondary | ICD-10-CM | POA: Diagnosis present

## 2015-03-25 DIAGNOSIS — I5033 Acute on chronic diastolic (congestive) heart failure: Secondary | ICD-10-CM | POA: Diagnosis present

## 2015-03-25 DIAGNOSIS — I5031 Acute diastolic (congestive) heart failure: Secondary | ICD-10-CM | POA: Diagnosis not present

## 2015-03-25 DIAGNOSIS — E785 Hyperlipidemia, unspecified: Secondary | ICD-10-CM | POA: Diagnosis present

## 2015-03-25 DIAGNOSIS — K5900 Constipation, unspecified: Secondary | ICD-10-CM | POA: Diagnosis not present

## 2015-03-25 DIAGNOSIS — I5181 Takotsubo syndrome: Secondary | ICD-10-CM | POA: Diagnosis present

## 2015-03-25 DIAGNOSIS — N390 Urinary tract infection, site not specified: Secondary | ICD-10-CM | POA: Diagnosis present

## 2015-03-25 DIAGNOSIS — L89312 Pressure ulcer of right buttock, stage 2: Secondary | ICD-10-CM | POA: Diagnosis present

## 2015-03-25 DIAGNOSIS — M810 Age-related osteoporosis without current pathological fracture: Secondary | ICD-10-CM | POA: Diagnosis present

## 2015-03-25 DIAGNOSIS — T45515A Adverse effect of anticoagulants, initial encounter: Secondary | ICD-10-CM | POA: Diagnosis present

## 2015-03-25 DIAGNOSIS — J449 Chronic obstructive pulmonary disease, unspecified: Secondary | ICD-10-CM | POA: Diagnosis present

## 2015-03-25 DIAGNOSIS — I509 Heart failure, unspecified: Secondary | ICD-10-CM

## 2015-03-25 DIAGNOSIS — Z9849 Cataract extraction status, unspecified eye: Secondary | ICD-10-CM | POA: Diagnosis not present

## 2015-03-25 HISTORY — DX: Pulmonary hypertension, unspecified: I27.20

## 2015-03-25 LAB — CBC WITH DIFFERENTIAL/PLATELET
BASOS ABS: 0 10*3/uL (ref 0.0–0.1)
Basophils Relative: 0 % (ref 0–1)
EOS PCT: 0 % (ref 0–5)
Eosinophils Absolute: 0 10*3/uL (ref 0.0–0.7)
HCT: 38.9 % (ref 36.0–46.0)
Hemoglobin: 13.2 g/dL (ref 12.0–15.0)
LYMPHS ABS: 0.8 10*3/uL (ref 0.7–4.0)
Lymphocytes Relative: 5 % — ABNORMAL LOW (ref 12–46)
MCH: 28.3 pg (ref 26.0–34.0)
MCHC: 33.9 g/dL (ref 30.0–36.0)
MCV: 83.3 fL (ref 78.0–100.0)
Monocytes Absolute: 0.7 10*3/uL (ref 0.1–1.0)
Monocytes Relative: 5 % (ref 3–12)
Neutro Abs: 13.5 10*3/uL — ABNORMAL HIGH (ref 1.7–7.7)
Neutrophils Relative %: 90 % — ABNORMAL HIGH (ref 43–77)
Platelets: 394 10*3/uL (ref 150–400)
RBC: 4.67 MIL/uL (ref 3.87–5.11)
RDW: 15.9 % — AB (ref 11.5–15.5)
WBC: 15 10*3/uL — ABNORMAL HIGH (ref 4.0–10.5)

## 2015-03-25 LAB — BASIC METABOLIC PANEL
Anion gap: 13 (ref 5–15)
BUN: 11 mg/dL (ref 6–20)
CO2: 18 mmol/L — ABNORMAL LOW (ref 22–32)
CREATININE: 0.55 mg/dL (ref 0.44–1.00)
Calcium: 8.2 mg/dL — ABNORMAL LOW (ref 8.9–10.3)
Chloride: 96 mmol/L — ABNORMAL LOW (ref 101–111)
GFR calc non Af Amer: >60 mL/min (ref >60)
Glucose, Bld: 129 mg/dL — ABNORMAL HIGH (ref 65–99)
Potassium: 4.1 mmol/L (ref 3.5–5.1)
SODIUM: 127 mmol/L — AB (ref 135–145)

## 2015-03-25 LAB — URINE MICROSCOPIC-ADD ON

## 2015-03-25 LAB — URINALYSIS, ROUTINE W REFLEX MICROSCOPIC
Bilirubin Urine: NEGATIVE
Glucose, UA: NEGATIVE mg/dL
KETONES UR: NEGATIVE mg/dL
Nitrite: NEGATIVE
Protein, ur: NEGATIVE mg/dL
Specific Gravity, Urine: 1.006 (ref 1.005–1.030)
UROBILINOGEN UA: 0.2 mg/dL (ref 0.0–1.0)
pH: 6 (ref 5.0–8.0)

## 2015-03-25 LAB — BRAIN NATRIURETIC PEPTIDE: B NATRIURETIC PEPTIDE 5: 364.8 pg/mL — AB (ref 0.0–100.0)

## 2015-03-25 LAB — I-STAT ARTERIAL BLOOD GAS, ED
Acid-base deficit: 3 mmol/L — ABNORMAL HIGH (ref 0.0–2.0)
Bicarbonate: 20.6 mEq/L (ref 20.0–24.0)
O2 Saturation: 99 %
PO2 ART: 149 mmHg — AB (ref 80.0–100.0)
TCO2: 22 mmol/L (ref 0–100)
pCO2 arterial: 30.9 mmHg — ABNORMAL LOW (ref 35.0–45.0)
pH, Arterial: 7.432 (ref 7.350–7.450)

## 2015-03-25 LAB — TROPONIN I: Troponin I: 0.12 ng/mL — ABNORMAL HIGH (ref <0.031)

## 2015-03-25 LAB — STREP PNEUMONIAE URINARY ANTIGEN: Strep Pneumo Urinary Antigen: NEGATIVE

## 2015-03-25 LAB — MAGNESIUM: MAGNESIUM: 1.1 mg/dL — AB (ref 1.7–2.4)

## 2015-03-25 LAB — PROCALCITONIN: Procalcitonin: 0.14 ng/mL

## 2015-03-25 LAB — PROTIME-INR
INR: 1.91 — ABNORMAL HIGH (ref 0.00–1.49)
PROTHROMBIN TIME: 21.8 s — AB (ref 11.6–15.2)

## 2015-03-25 LAB — GLUCOSE, CAPILLARY: Glucose-Capillary: 177 mg/dL — ABNORMAL HIGH (ref 65–99)

## 2015-03-25 LAB — PHOSPHORUS: Phosphorus: 2.8 mg/dL (ref 2.5–4.6)

## 2015-03-25 LAB — MRSA PCR SCREENING: MRSA by PCR: NEGATIVE

## 2015-03-25 MED ORDER — ALBUTEROL SULFATE (2.5 MG/3ML) 0.083% IN NEBU: 3.0000 mL | INHALATION_SOLUTION | RESPIRATORY_TRACT | Status: DC | PRN

## 2015-03-25 MED ORDER — SODIUM CHLORIDE 0.9 % IV SOLN
250.0000 mL | INTRAVENOUS | Status: DC | PRN
  Administered 2015-03-27 – 2015-03-30 (×3): 250 mL via INTRAVENOUS

## 2015-03-25 MED ORDER — DEXTROSE 5 % IV SOLN
2.0000 g | Freq: Three times a day (TID) | INTRAVENOUS | Status: DC
  Administered 2015-03-25 – 2015-03-26 (×2): 2 g via INTRAVENOUS
  Filled 2015-03-25 (×7): qty 2

## 2015-03-25 MED ORDER — MAGNESIUM SULFATE IN D5W 10-5 MG/ML-% IV SOLN
1.0000 g | Freq: Once | INTRAVENOUS | Status: AC
  Administered 2015-03-25: 1 g via INTRAVENOUS
  Filled 2015-03-25: qty 100

## 2015-03-25 MED ORDER — FUROSEMIDE 10 MG/ML IJ SOLN
40.0000 mg | Freq: Once | INTRAMUSCULAR | Status: AC
  Administered 2015-03-25: 40 mg via INTRAVENOUS
  Filled 2015-03-25: qty 4

## 2015-03-25 MED ORDER — METHYLPREDNISOLONE SODIUM SUCC 40 MG IJ SOLR
40.0000 mg | Freq: Four times a day (QID) | INTRAMUSCULAR | Status: DC
  Administered 2015-03-25 – 2015-03-30 (×19): 40 mg via INTRAVENOUS
  Filled 2015-03-25 (×22): qty 1

## 2015-03-25 MED ORDER — VANCOMYCIN HCL 500 MG IV SOLR
500.0000 mg | Freq: Two times a day (BID) | INTRAVENOUS | Status: DC
  Administered 2015-03-25 – 2015-03-26 (×2): 500 mg via INTRAVENOUS
  Filled 2015-03-25 (×5): qty 500

## 2015-03-25 MED ORDER — TADALAFIL (PAH) 20 MG PO TABS
20.0000 mg | ORAL_TABLET | Freq: Every day | ORAL | Status: DC
  Administered 2015-03-26 – 2015-03-31 (×6): 20 mg via ORAL
  Filled 2015-03-25 (×6): qty 1

## 2015-03-25 MED ORDER — ESCITALOPRAM OXALATE 10 MG PO TABS
10.0000 mg | ORAL_TABLET | Freq: Every day | ORAL | Status: DC
  Administered 2015-03-26 – 2015-04-03 (×9): 10 mg via ORAL
  Filled 2015-03-25 (×9): qty 1

## 2015-03-25 MED ORDER — PANTOPRAZOLE SODIUM 40 MG IV SOLR
40.0000 mg | Freq: Every day | INTRAVENOUS | Status: DC
  Administered 2015-03-25 – 2015-03-27 (×3): 40 mg via INTRAVENOUS
  Filled 2015-03-25 (×5): qty 40

## 2015-03-25 NOTE — ED Provider Notes (Signed)
History   Chief Complaint  Patient presents with  . Respiratory Distress    HPI 80 year old female past medical history as below notable for chronic diastolic heart failure, PAH, A. fib status post ablation, cardiomyopathy, asthma, bronchiectasis, recent admission for acute heart failure 7/8-7/16 who presents ED and acute respiratory distress via EMS. EMS was called that time patient initially to have sats in the 70s. She was speaking short sentences and had poor color. They placed patient on CPAP and patient reported improvement in her color improved as well. Patient was denying any chest pain. Patient remained stable during transport and sats remained in the low 90s. Patient did receive 2 nebs, slight Medrol during transport for wheezing noted by EMS. Family reports patient has had several instances where she required CPAP for respiratory distress. History is limited secondary to patient condition at this time and fact that there are no family members present. Per EMS, patient was working with PT earlier in the day prior to symptom onset.  Past medical/surgical history, social history, medications, allergies and FH have been reviewed with patient and/or in documentation. Furthermore, if pt family or friend(s) present, additional historical information was obtained from them.  Past Medical History  Diagnosis Date  . Asthma   . Rhinitis   . Osteoarthritis   . Osteoporosis   . Peptic ulcer disease   . Depression   . Atrial fibrillation     s/p ablation   . Takotsubo cardiomyopathy     resolved  . Shingles   . Eosinophilia   . Sinoatrial node dysfunction   . GERD (gastroesophageal reflux disease)   . Bronchiectasis   . Peptic ulcer disease   . Osteoarthritis   . Herpes zoster   . Hyperlipemia   . Injury of splenic artery     infarct   Past Surgical History  Procedure Laterality Date  . Cholecystectomy    . Tonsillectomy    . Cataract extraction    . Pacemaker insertion   09/29/06    MDT ADDR01 dual chamber pacemaker implanted for SSS - programmed VVIR 2/2 permanent atrial fibrillation  . Cosmetic surgery    . Cardiac catheterization N/A 03/05/2015    Procedure: Right/Left Heart Cath and Coronary Angiography;  Surgeon: Laurey Morale, MD;  Location: [facility] INVASIVE CV LAB;  Service: Cardiovascular;  Laterality: N/A;   Family History  Problem Relation Age of Onset  . Coronary artery disease      femal 1st degree relative  . Liver cancer Son   . Emphysema Sister   . Asthma Mother   . Depression Father     nervous breakdown   History  Substance Use Topics  . Smoking status: Never Smoker   . Smokeless tobacco: Never Used  . Alcohol Use: No     Review of Systems Unable to obtain secondary to patient condition.  Physical Exam  Physical Exam ED Triage Vitals  Enc Vitals Group     BP 03/25/15 1600 104/92 mmHg     Pulse Rate 03/25/15 1600 101     Resp 03/25/15 1600 25     Temp 03/25/15 1602 97.1 F (36.2 C)     Temp Source 03/25/15 1602 Axillary     SpO2 03/25/15 1600 93 %     Weight 03/25/15 1602 124 lb (56.246 kg)     Height 03/25/15 1602 5\' 4"  (1.626 m)     Head Cir --      Peak Flow --  Pain Score 03/25/15 1602 0     Pain Loc --      Pain Edu? --      Excl. in GC? --     Constitutional: Chronically ill appearing elderly female in acute respiratory distress with BiPAP ongoing on arrival. Patient speaking 1-2 word statements secondary to her respiratory distress. Head: Normocephalic and atraumatic.  Eyes: Extraocular motion intact, no scleral icterus Mouth: MMM, OP clear Neck: Supple without meningismus, mass, or overt JVD Respiratory: Coarse breath sounds bilaterally with faint expiratory wheezes and upper lung fields and significant increased work of breathing. CV: Tachycardic, no obvious murmurs.  Pulses +2 and symmetric. Volume overloaded with JVD and 2+ pitting edema in bilateral lower extremities. Soft, NT, ND, no r/g. No mass.   MSK: Extremities are atraumatic without deformity, ROM intact Skin: Warm, dry, intact without rash Neuro: AAOxperson/place, MAE 5/5 sym, no focal deficit noted   ED Course  Procedures   Labs Reviewed  BASIC METABOLIC PANEL - Abnormal; Notable for the following:    Sodium 127 (*)    Chloride 96 (*)    CO2 18 (*)    Glucose, Bld 129 (*)    Calcium 8.2 (*)    All other components within normal limits  CBC WITH DIFFERENTIAL/PLATELET - Abnormal; Notable for the following:    WBC 15.0 (*)    RDW 15.9 (*)    Neutrophils Relative % 90 (*)    Neutro Abs 13.5 (*)    Lymphocytes Relative 5 (*)    All other components within normal limits  BRAIN NATRIURETIC PEPTIDE - Abnormal; Notable for the following:    B Natriuretic Peptide 364.8 (*)    All other components within normal limits  TROPONIN I - Abnormal; Notable for the following:    Troponin I 0.12 (*)    All other components within normal limits  MAGNESIUM - Abnormal; Notable for the following:    Magnesium 1.1 (*)    All other components within normal limits  PROTIME-INR - Abnormal; Notable for the following:    Prothrombin Time 21.8 (*)    INR 1.91 (*)    All other components within normal limits  URINALYSIS, ROUTINE W REFLEX MICROSCOPIC (NOT AT [facility]) - Abnormal; Notable for the following:    Hgb urine dipstick MODERATE (*)    Leukocytes, UA MODERATE (*)    All other components within normal limits  GLUCOSE, CAPILLARY - Abnormal; Notable for the following:    Glucose-Capillary 177 (*)    All other components within normal limits  I-STAT ARTERIAL BLOOD GAS, ED - Abnormal; Notable for the following:    pCO2 arterial 30.9 (*)    pO2, Arterial 149.0 (*)    Acid-base deficit 3.0 (*)    All other components within normal limits  MRSA PCR SCREENING  URINE CULTURE  CULTURE, BLOOD (ROUTINE X 2)  CULTURE, BLOOD (ROUTINE X 2)  CULTURE, EXPECTORATED SPUTUM-ASSESSMENT  PHOSPHORUS  PROCALCITONIN  STREP PNEUMONIAE URINARY ANTIGEN   URINE MICROSCOPIC-ADD ON  MAGNESIUM  CBC WITH DIFFERENTIAL/PLATELET  PROTIME-INR  PROCALCITONIN  LEGIONELLA ANTIGEN, URINE  RENAL FUNCTION PANEL   I personally reviewed and interpreted all labs.  DG Chest Portable 1 View  Final Result     I personally viewed above image(s) which were used in my medical decision making. Formal interpretations by Radiology.  MDM: [name] is a 80 y.o. female with H&P as above who p/w CC: Respiratory distress  On arrival, patient is on BiPAP with increased  work of breathing. Patient appears volume overloaded with coarse rales in her bilateral lower lung fields as well as mild expiratory wheezes in the upper fields. Presumed acute heart failure. Patient's blood pressure was 1:30 systolic on arrival and she is mildly tachycardic. Her sats are in the 90s on BiPAP. Chest x-ray shows diffuse pulmonary edema. BNP 300s. Patient was given IV Lasix in ED. EKG shows atrial fibrillation at a rate of 100 and no signs of acute ischemia and is unchanged from prior EKG on 03/04/15.  Cards consulted and rec'd critical care admit. Pt will be admitted to stepdown. Remained HDS in ED.   Old records reviewed (if available). Labs and imaging reviewed personally by myself and considered in medical decision making if ordered.  Clinical Impression: 1. Acute on chronic respiratory failure with hypoxia     Disposition: Admit  Condition: stable  I have discussed the results, Dx and Tx plan with the pt(& family if present). He/she/they expressed understanding and agree(s) with the plan.  Pt seen in conjunction with Dr. Gwendolyn Lima, DO [facility] Emergency Medicine Resident - PGY-3     Ames Dura, MD 03/25/15 [id number]  Nelva Nay, MD 04/01/15 [phone]

## 2015-03-25 NOTE — ED Notes (Signed)
Cardiologist at bedside with patient and her husband.

## 2015-03-25 NOTE — Telephone Encounter (Signed)
Please send in prescription for the Letaris 5 mg to [facility] on [address]. I see that it was approved on 03-15-15.  Mr. Felgar is concerned that he will not have proper medication for patient when he brings her home.  Thank you.

## 2015-03-25 NOTE — ED Notes (Signed)
Spoke with Dr. Celene Skeen, told unable to obtain blood cultures in ED. Reports try on floor if not may need arterial stick. Also solumedrol can be held until 10 pm tonight since EMS gave at 1600 today.

## 2015-03-25 NOTE — ED Notes (Signed)
admitting MD at bedside

## 2015-03-25 NOTE — ED Notes (Addendum)
Pt sent from [facility] for respiratory distress. Pt noted to have labored breathing and respirations 35 after physical therapy. Pt on cpap on arrival to ED. Oxygen 75% on room air, respiratory at bedside to set up BiPAP. Pt is Alert and following commands. Pt given .25mg  ativan, 10mg  albuterol, 125mg  soul-medrol .5mg  atrovent in route.

## 2015-03-25 NOTE — Telephone Encounter (Signed)
lmtcb for pt.  

## 2015-03-25 NOTE — Telephone Encounter (Signed)
Patient's husband called again about the medicine.  He is very anxious and needs this written as soon as possible. He states she is struggling and needs this medicine.  [facility]

## 2015-03-25 NOTE — Telephone Encounter (Signed)
Spoke with the pt's spouse and notified RB is out of the office this wk and we will let him know once he addresses the msg  He verbalized understanding

## 2015-03-25 NOTE — H&P (Signed)
PULMONARY / CRITICAL CARE MEDICINE   Name: [name] MRN: [id number] DOB: February 20, 1935    ADMISSION DATE:  03/25/2015 CONSULTATION DATE:  03/25/15  REFERRING MD :  ED  CHIEF COMPLAINT:  Acute on Chronic Hypoxic Respiratory Failure  INITIAL PRESENTATION: 80 year old female with known history of pulmonary hypertension started on tadalafil during last hospitalization with only mild nausea. Patient presents with subjective symptoms suggestive of an infectious etiology to her SIRS as well as acute on chronic hypoxic respiratory failure with physical exam findings of overt fluid overload.   STUDIES: PORTABLE CXR (03/25/15): Increased prominence of bilateral interstitial markings. No focal consolidation appreciated.  V/Q Scan (01/17/15): Patchy deposition of radiopharmaceutical in both lungs on ventilation. No wedge-shaped perfusion defects to suggest acute pulmonary embolism. Findings consistent with low probability for acute pulmonary embolism.  HRCT Chest (03/06/15): Findings suggestive of an SI pain bilaterally.  SIGNIFICANT EVENTS: 7/16 - Discharged from hospital 8/2 - BiPAP initiated 8/2 - Admitted   HISTORY OF PRESENT ILLNESS:   The patient is a 79 year old female known briefly to me from last admission followed by Dr. Lamonte Sakai for her pulmonary hypertension. She was started on tadalafil at last appointment but this was not continued as an outpatient. Most of the history is obtained from her husband as the patient is currently on BiPAP. Evidently over the weekend she was having an increased cough productive of a "white frothy phlegm". The patient herself endorses hot and cold chills as well as a fever of 101F but denies any sweats. She has been participating in physical therapy but her husband reports she is desaturating into the upper 80s with exertion and simply putting on her clothes. The patient denies any chest pain, tightness, or pressure. She denies any abdominal pain but did have some  nausea over the weekend with poor oral intake. She denies any diarrhea and has been having normal bowel movements. She denies any headache or near syncope prior to today but did not frankly lose consciousness.. The patient denies any sore throat or sinus congestion. The patient does have a Foley catheter in place and husband reports that removal has been attempted repeatedly but unsuccessfully due to urinary retention and she had an appointment with urology upcoming this week.   PAST MEDICAL HISTORY :  Past Medical History  Diagnosis Date  . Asthma   . Rhinitis   . Osteoarthritis   . Osteoporosis   . Peptic ulcer disease   . Depression   . Atrial fibrillation     s/p ablation   . Takotsubo cardiomyopathy     resolved  . Shingles   . Eosinophilia   . Sinoatrial node dysfunction   . GERD (gastroesophageal reflux disease)   . Bronchiectasis   . Peptic ulcer disease   . Osteoarthritis   . Herpes zoster   . Hyperlipemia   . Injury of splenic artery     infarct  . Pulmonary hypertension    PAST SURGICAL HISTORY Past Surgical History  Procedure Laterality Date  . Cholecystectomy    . Tonsillectomy    . Cataract extraction    . Pacemaker insertion  09/29/06    MDT [id number] dual chamber pacemaker implanted for SSS - programmed VVIR 2/2 permanent atrial fibrillation  . Cosmetic surgery    . Cardiac catheterization N/A 03/05/2015    Procedure: Right/Left Heart Cath and Coronary Angiography;  Surgeon: Larey Dresser, MD;  Location: [facility];  Service: Cardiovascular;  Laterality: N/A;    Prior  to Admission medications   Medication Sig Start Date End Date Taking? Authorizing Provider  albuterol (PROVENTIL) (2.5 MG/3ML) 0.083% nebulizer solution Take 3 mLs by nebulization every 4 (four) hours as needed for wheezing or shortness of breath. 03/08/15   Barton Dubois, MD  Amino Acids-Protein Hydrolys (FEEDING SUPPLEMENT, PRO-STAT SUGAR FREE 64,) LIQD Take 30 mLs by mouth 2 (two) times  daily. 03/08/15   Barton Dubois, MD  Ascorbic Acid (VITAMIN C) 1000 MG tablet Take 1,000 mg by mouth daily.      Historical Provider, MD  beclomethasone (QVAR) 80 MCG/ACT inhaler Inhale 2 puffs into the lungs 2 (two) times daily. 05/08/14   Collene Gobble, MD  BEPREVE 1.5 % SOLN Place 1 drop into both eyes at bedtime.  04/07/13   Historical Provider, MD  budesonide (RHINOCORT AQUA) 32 MCG/ACT nasal spray Place 1 spray into both nostrils daily. 01/23/14   Collene Gobble, MD  Calcium Carbonate (CALCIUM 600) 1500 MG TABS Take 1 tablet by mouth daily.      Historical Provider, MD  Cholecalciferol (VITAMIN D3) 1000 UNITS CAPS Take 1 capsule by mouth daily.      Historical Provider, MD  escitalopram (LEXAPRO) 10 MG tablet TAKE 1 TABLET (10 MG TOTAL) BY MOUTH DAILY. 10/22/14   Rosalita Chessman, DO  esomeprazole (NEXIUM) 40 MG capsule TAKE ONE CAPSULE BY MOUTH EVERY DAY BEFORE BREAKFAST Patient taking differently: Take 40 mg by mouth at bedtime.  05/22/14   Alferd Apa Lowne, DO  fluticasone (FLONASE) 50 MCG/ACT nasal spray Place 2 sprays into both nostrils 2 (two) times daily. 10/17/13   Collene Gobble, MD  furosemide (LASIX) 20 MG tablet Take 1 tablet (20 mg total) by mouth daily. 03/08/15   Barton Dubois, MD  lisinopril (PRINIVIL,ZESTRIL) 2.5 MG tablet Take 1 tablet (2.5 mg total) by mouth daily. 03/08/15   Barton Dubois, MD  loratadine (CLARITIN) 10 MG tablet Take 10 mg by mouth daily.    Historical Provider, MD  Multiple Vitamin (MULTIVITAMIN) tablet Take 1 tablet by mouth daily.      Historical Provider, MD  PATADAY 0.2 % SOLN INSTILL 1 DROP IN BOTH EYES DAILY 12/24/14   Historical Provider, MD  Tadalafil, PAH, 20 MG TABS Take 1 tablet (20 mg total) by mouth daily. 03/08/15   Barton Dubois, MD  tiotropium (SPIRIVA) 18 MCG inhalation capsule Place 1 capsule (18 mcg total) into inhaler and inhale daily. 08/08/14   Collene Gobble, MD  traZODone (DESYREL) 100 MG tablet TAKE 2 TABLETS BY MOUTH AT BEDTIME 03/17/15   Rosalita Chessman, DO  warfarin (COUMADIN) 2.5 MG tablet Take 1 tablet (2.5 mg total) by mouth daily at 6 PM. 2.5 mg on Tues and Thurs.  1.25 mg all other days. 03/09/15   Barton Dubois, MD   Allergies  Allergen Reactions  . Benzoyl Peroxide Swelling    Swelling at the site of use  . Neomycin-Bacitracin Zn-Polymyx Swelling    Swelling at the site of use    FAMILY HISTORY:  Family History  Problem Relation Age of Onset  . Coronary artery disease      femal 1st degree relative  . Liver cancer Son   . Emphysema Sister   . Asthma Mother   . Depression Father     nervous breakdown  . Rheum arthritis Sister    SOCIAL HISTORY: History   Social History  . Marital Status: Married    Spouse Name: N/A  . Number of Children:  N/A  . Years of Education: N/A   Occupational History  . retired-- school [employer]    Social History Main Topics  . Smoking status: Never Smoker   . Smokeless tobacco: Never Used  . Alcohol Use: No  . Drug Use: No  . Sexual Activity:    Partners: Male   Other Topics Concern  . None   Social History Narrative   Patient has been at a skilled nursing facility for rehabilitation. Her husband reports no pets currently. She does have remote exposure to parakeets.    REVIEW OF SYSTEMS: No rash or abnormal bruising. No joint pain, swelling, stiffness, or erythema.  A pertinent 14 point review of systems is negative except as per the history of presenting illness.  SUBJECTIVE:   VITAL SIGNS: Temp:  [97.1 F (36.2 C)] 97.1 F (36.2 C) (08/02 1602) Pulse Rate:  [93-101] 94 (08/02 1745) Resp:  [22-41] 32 (08/02 1745) BP: (104-135)/(61-92) 113/63 mmHg (08/02 1745) SpO2:  [93 %-100 %] 93 % (08/02 1804) Weight:  [124 lb (56.246 kg)] 124 lb (56.246 kg) (08/02 1602) HEMODYNAMICS:   VENTILATOR SETTINGS:   INTAKE / OUTPUT:  Intake/Output Summary (Last 24 hours) at 03/25/15 1816 Last data filed at 03/25/15 1744  Gross per 24 hour  Intake      0 ml  Output    600 ml  Net    -600 ml    PHYSICAL EXAMINATION: General:  Awake. Alert. No acute distress. Husband at bedside.  Integument:  Warm & dry. No rash on exposed skin. No bruising. Lymphatics:  No appreciated cervical or supraclavicular lymphadenoapthy. HEENT:  BiPAP mask in place. No scleral injection or icterus. PERRL. Cardiovascular:  Regular rate. No edema. No appreciable JVD.  Pulmonary:  Bilateral basilar predominant crackles. Symmetric chest wall expansion. Patient with moderately increased work of breathing on BiPAP. Abdomen: Soft. Normal bowel sounds. Nondistended. Grossly nontender. Musculoskeletal:  Normal bulk and tone. Hand grip strength 5/5 bilaterally. No joint deformity or effusion appreciated. Neurological:  CN 2-12 grossly in tact. No meningismus. Moving all 4 extremities equally. Symmetric patellar deep tendon reflexes. Psychiatric:  Mood and affect congruent. Speech is somewhat pressured due to tachypnea.   LABS:  CBC  Recent Labs Lab 03/25/15 1649  WBC 15.0*  HGB 13.2  HCT 38.9  PLT 394   Coag's No results for input(s): APTT, INR in the last 168 hours. BMET No results for input(s): NA, K, CL, CO2, BUN, CREATININE, GLUCOSE in the last 168 hours. Electrolytes No results for input(s): CALCIUM, MG, PHOS in the last 168 hours. Sepsis Markers No results for input(s): LATICACIDVEN, PROCALCITON, O2SATVEN in the last 168 hours. ABG  Recent Labs Lab 03/25/15 1641  PHART 7.432  PCO2ART 30.9*  PO2ART 149.0*   Liver Enzymes No results for input(s): AST, ALT, ALKPHOS, BILITOT, ALBUMIN in the last 168 hours. Cardiac Enzymes No results for input(s): TROPONINI, PROBNP in the last 168 hours. Glucose No results for input(s): GLUCAP in the last 168 hours.  Imaging Dg Chest Portable 1 View  03/25/2015   CLINICAL DATA:  Sudden onset of respiratory distress  EXAM: PORTABLE CHEST - 1 VIEW  COMPARISON:  02/28/2015  FINDINGS: Cardiomediastinal silhouette is stable. There is central mild  vascular congestion. Worsening interstitial prominence bilaterally. Findings highly suspicious for pulmonary edema or bilateral pneumonitis. Clinical correlation is necessary. Stable dual lead cardiac pacemaker position.  IMPRESSION: There is central mild vascular congestion. Worsening interstitial prominence bilaterally. Findings highly suspicious for pulmonary edema or bilateral pneumonitis.  Clinical correlation is necessary.   Electronically Signed   By: Lahoma Crocker M.D.   On: 03/25/2015 16:13     ASSESSMENT / PLAN:  PULMONARY BiPAP 8/2 >> A: Acute on chronic hypoxic respiratory failure  Pulmonary hypertension   P:   Continuing BiPAP support  Patient is DO NOT INTUBATE Lasix IV 1 administered in the ED Restarting tadalafil Monitor urine output closely Checking ESR Initiating empiric treatment for possible acute flare of ILD w/ Solu-Medrol IV q6hr Albuterol neb prn  CARDIOVASCULAR A: H/O Atrial fibrillation Pulmonary hypertension  P:  Holding Coumadin checking INR & trending daily Monitor on telemetry Restarting tadalafil Monitor volume status for need for further diuresis  RENAL A:  Urinary retention  P:   Continue Foley catheter Await BMP ordered in ED Monitor urine output as well as daily BUN/creatinine  GASTROINTESTINAL A:   No acute issues H/O Peptic Ulcer disease  P:   Nothing by mouth except for meds with sips of water  Protonix IV daily  HEMATOLOGIC A:   Leukocytosis Chronic Coumadin  P:  INR stat holding Coumadin Leukocytosis likely secondary to sepsis/SIRS Trending leukocytosis with daily CBC  INFECTIOUS A:   Sepsis - UTI versus pneumonia (HCAP)  P:   Procalcitonin algorithm Trending leukocytosis  BCx2 8/2 >> UC 8/2 >> Sputum Culture ?8/2>>   Abx:  Vancomycin, start date 8/2, day 1/x Fortaz, start date 8/2, day 1/x  ENDOCRINE A:   No acute issues  P:   Monitor glucose on daily labs  NEUROLOGIC A:   No acute  issues  P:   Monitor for mental status changes   FAMILY  - Updates:  Husband at bedside and updated regarding plan of care.  - Inter-disciplinary family meet or Palliative Care meeting due by:  8/9   TODAY'S SUMMARY: The patient's status remains critical and guarded with high potential for further decompensation from her acute on chronic hypoxic respiratory failure and multiple medical problems including pulmonary hypertension. I am empirically treating her for sepsis from urinary versus pulmonary source. I'm also initiating empiric steroids for a possible acute flare of underlying ILD given her prior high-resolution CT scan findings. Both she and her husband understand that her status is critical and she herself has expressed a DO NOT INTUBATE status but is comfortable with other resuscitation measures.   I have spent a total of 45 minutes of critical care time today caring for the patient, reviewing the patient's electronic medical record, & updating the patient's husband at bedside.  Sonia Baller Ashok Cordia, M.D. [facility] Pager:  [phone] After 3pm or if no response, call [phone] 03/25/2015, 6:16 PM

## 2015-03-25 NOTE — ED Notes (Signed)
Family at bedside. 

## 2015-03-25 NOTE — Telephone Encounter (Signed)
[name] returning call [phone]

## 2015-03-25 NOTE — Progress Notes (Signed)
ANTIBIOTIC CONSULT NOTE - INITIAL  Pharmacy Consult for Vancomycin and Ceftazidime Indication: rule out sepsis  Allergies  Allergen Reactions  . Benzoyl Peroxide Swelling    Swelling at the site of use  . Neomycin-Bacitracin Zn-Polymyx Swelling    Swelling at the site of use    Patient Measurements: Height: 5\' 4"  (162.6 cm) Weight: 124 lb (56.246 kg) IBW/kg (Calculated) : 54.7 Adjusted Body Weight:   Vital Signs: Temp: 97.1 F (36.2 C) (08/02 1602) Temp Source: Axillary (08/02 1602) BP: 113/63 mmHg (08/02 1745) Pulse Rate: 94 (08/02 1745) Intake/Output from previous day:   Intake/Output from this shift: Total I/O In: -  Out: 600 [Urine:600]  Labs:  Recent Labs  03/25/15 1649  WBC 15.0*  HGB 13.2  PLT 394   Estimated Creatinine Clearance: 50.9 mL/min (by C-G formula based on Cr of 0.56). No results for input(s): VANCOTROUGH, VANCOPEAK, VANCORANDOM, GENTTROUGH, GENTPEAK, GENTRANDOM, TOBRATROUGH, TOBRAPEAK, TOBRARND, AMIKACINPEAK, AMIKACINTROU, AMIKACIN in the last 72 hours.   Microbiology: Recent Results (from the past 720 hour(s))  MRSA PCR Screening     Status: None   Collection Time: 02/28/15 11:22 PM  Result Value Ref Range Status   MRSA by PCR NEGATIVE NEGATIVE Final    Comment:        The GeneXpert MRSA Assay (FDA approved for NASAL specimens only), is one component of a comprehensive MRSA colonization surveillance program. It is not intended to diagnose MRSA infection nor to guide or monitor treatment for MRSA infections.     Medical History: Past Medical History  Diagnosis Date  . Asthma   . Rhinitis   . Osteoarthritis   . Osteoporosis   . Peptic ulcer disease   . Depression   . Atrial fibrillation     s/p ablation   . Takotsubo cardiomyopathy     resolved  . Shingles   . Eosinophilia   . Sinoatrial node dysfunction   . GERD (gastroesophageal reflux disease)   . Bronchiectasis   . Peptic ulcer disease   . Osteoarthritis   .  Herpes zoster   . Hyperlipemia   . Injury of splenic artery     infarct    Medications:   (Not in a hospital admission) Scheduled:  . [START ON 03/26/2015] escitalopram  10 mg Oral Daily  . methylPREDNISolone (SOLU-MEDROL) injection  40 mg Intravenous Q6H  . [START ON 03/26/2015] Tadalafil (PAH)  20 mg Oral Daily   Infusions:     Assessment: 80yo female with history of Afib, eosinophilia, GERD and cardiomyopathy presents with respiratory distress. Pharmacy is consulted to dose vancomycin and ceftazidime for suspected sepsis. Pt is afebrile, WBC 15, sCr 0.55 and PCT 0.14.  Goal of Therapy:  Vancomycin trough level 15-20 mcg/ml  Plan:  Vancomycin 500mg  IV q12h Ceftazidime 2g IV q8h Expected duration 7 days with resolution of temperature and/or normalization of WBC Measure antibiotic drug levels at steady state Follow up culture results, renal function and clinical course  Arlean Hopping. Newman Pies, PharmD Clinical Pharmacist Pager [phone] 03/25/2015,6:05 PM

## 2015-03-25 NOTE — Consult Note (Signed)
Patient ID: [name] MRN: [id number], DOB/AGE: [birth year]   Admit date: 03/25/2015   Primary Physician: Loreen Freud, DO Primary Cardiologist: Dr. Graciela Husbands  Pt. Profile:  80 y/o female with h/o bronchiectasis, PAH, moderate CAD, chronic diastolic chf and permanent afib presenting with acute respiratory distress.   Problem List  Past Medical History  Diagnosis Date  . Asthma   . Rhinitis   . Osteoarthritis   . Osteoporosis   . Peptic ulcer disease   . Depression   . Atrial fibrillation     s/p ablation   . Takotsubo cardiomyopathy     resolved  . Shingles   . Eosinophilia   . Sinoatrial node dysfunction   . GERD (gastroesophageal reflux disease)   . Bronchiectasis   . Peptic ulcer disease   . Osteoarthritis   . Herpes zoster   . Hyperlipemia   . Injury of splenic artery     infarct    Past Surgical History  Procedure Laterality Date  . Cholecystectomy    . Tonsillectomy    . Cataract extraction    . Pacemaker insertion  09/29/06    MDT [id number] dual chamber pacemaker implanted for SSS - programmed VVIR 2/2 permanent atrial fibrillation  . Cosmetic surgery    . Cardiac catheterization N/A 03/05/2015    Procedure: Right/Left Heart Cath and Coronary Angiography;  Surgeon: Laurey Morale, MD;  Location: [facility] INVASIVE CV LAB;  Service: Cardiovascular;  Laterality: N/A;     Allergies  Allergies  Allergen Reactions  . Benzoyl Peroxide Swelling    Swelling at the site of use  . Neomycin-Bacitracin Zn-Polymyx Swelling    Swelling at the site of use    HPI  80 y.o. female with a past medical history significant for permanent atrial fibrillation with slow ventricular response, s/p dual chamber pacemaker programmed VVIR, moderate CAD by recent cath 03/05/15, bronchietasis, moderate obstructive lung disease, moderate PAH, remote Takotsubo with recovered LV function, chronic diastolic CHF, EF 62-13% on echo 12/2014  She has been followed by Dr. Delton Coombes, over the last year  she has developed worsening dyspnea and hypoxemia, requiring O2 4L/min at rest.  She has had abnormal rheumatological studies (RA and ANA), no evidence of pulmonary embolism or sleep apnea.   She was recently admitted 7/8-7/16 for Acute on chronic respiratory failure: multifactorial due to Acute on chronic diastolic heart failure and pulmonary hypertension.  After diuresis, R/LHC showed mild to moderate pulmonary arterial hypertension with high PVR and low cardiac index (2.1 by Fick and 1.8 by thermodilution). Filling pressures are low. It was suspected group 1 PAH related to rheumatological disease.  LHC showed moderate CAD. She was treated with lasix and tadafil. CT scan was concerning for UIP. She was d/c to SNF.   She presents back to the ED today with recurrent respiratory distress. She had labored breathing with RR at 35. She was placed on BiPAP.  Pt given 0.25mg  ativan, 10mg  albuterol, 125mg  soul-medrol .5mg  atrovent. CXR shows central mild vascular congestion. Worsening interstitial prominence bilaterally. Findings highly suspicious for pulmonary edema or bilateral pneumonitis. Her weight is unchanged since discharge at 124 lb. BNP pending. WBC is elevated at 15. She is afebrile.     Home Medications  Prior to Admission medications   Medication Sig Start Date End Date Taking? Authorizing Provider  albuterol (PROVENTIL) (2.5 MG/3ML) 0.083% nebulizer solution Take 3 mLs by nebulization every 4 (four) hours as needed for wheezing or shortness of breath. 03/08/15  Vassie Loll, MD  Amino Acids-Protein Hydrolys (FEEDING SUPPLEMENT, PRO-STAT SUGAR FREE 64,) LIQD Take 30 mLs by mouth 2 (two) times daily. 03/08/15   Vassie Loll, MD  Ascorbic Acid (VITAMIN C) 1000 MG tablet Take 1,000 mg by mouth daily.      Historical Provider, MD  beclomethasone (QVAR) 80 MCG/ACT inhaler Inhale 2 puffs into the lungs 2 (two) times daily. 05/08/14   Leslye Peer, MD  BEPREVE 1.5 % SOLN Place 1 drop into both  eyes at bedtime.  04/07/13   Historical Provider, MD  budesonide (RHINOCORT AQUA) 32 MCG/ACT nasal spray Place 1 spray into both nostrils daily. 01/23/14   Leslye Peer, MD  Calcium Carbonate (CALCIUM 600) 1500 MG TABS Take 1 tablet by mouth daily.      Historical Provider, MD  Cholecalciferol (VITAMIN D3) 1000 UNITS CAPS Take 1 capsule by mouth daily.      Historical Provider, MD  escitalopram (LEXAPRO) 10 MG tablet TAKE 1 TABLET (10 MG TOTAL) BY MOUTH DAILY. 10/22/14   Lelon Perla, DO  esomeprazole (NEXIUM) 40 MG capsule TAKE ONE CAPSULE BY MOUTH EVERY DAY BEFORE BREAKFAST Patient taking differently: Take 40 mg by mouth at bedtime.  05/22/14   Grayling Congress Lowne, DO  fluticasone (FLONASE) 50 MCG/ACT nasal spray Place 2 sprays into both nostrils 2 (two) times daily. 10/17/13   Leslye Peer, MD  furosemide (LASIX) 20 MG tablet Take 1 tablet (20 mg total) by mouth daily. 03/08/15   Vassie Loll, MD  lisinopril (PRINIVIL,ZESTRIL) 2.5 MG tablet Take 1 tablet (2.5 mg total) by mouth daily. 03/08/15   Vassie Loll, MD  loratadine (CLARITIN) 10 MG tablet Take 10 mg by mouth daily.    Historical Provider, MD  Multiple Vitamin (MULTIVITAMIN) tablet Take 1 tablet by mouth daily.      Historical Provider, MD  PATADAY 0.2 % SOLN INSTILL 1 DROP IN BOTH EYES DAILY 12/24/14   Historical Provider, MD  Tadalafil, PAH, 20 MG TABS Take 1 tablet (20 mg total) by mouth daily. 03/08/15   Vassie Loll, MD  tiotropium (SPIRIVA) 18 MCG inhalation capsule Place 1 capsule (18 mcg total) into inhaler and inhale daily. 08/08/14   Leslye Peer, MD  traZODone (DESYREL) 100 MG tablet TAKE 2 TABLETS BY MOUTH AT BEDTIME 03/17/15   Lelon Perla, DO  warfarin (COUMADIN) 2.5 MG tablet Take 1 tablet (2.5 mg total) by mouth daily at 6 PM. 2.5 mg on Tues and Thurs.  1.25 mg all other days. 03/09/15   Vassie Loll, MD    Family History  Family History  Problem Relation Age of Onset  . Coronary artery disease      femal 1st degree  relative  . Liver cancer Son   . Emphysema Sister   . Asthma Mother   . Depression Father     nervous breakdown    Social History  History   Social History  . Marital Status: Married    Spouse Name: N/A  . Number of Children: N/A  . Years of Education: N/A   Occupational History  . retired-- school [employer]    Social History Main Topics  . Smoking status: Never Smoker   . Smokeless tobacco: Never Used  . Alcohol Use: No  . Drug Use: No  . Sexual Activity:    Partners: Male   Other Topics Concern  . Not on file   Social History Narrative     Review of Systems General:  No  chills,  night sweats or weight changes. + occasional feve Cardiovascular:  No chest pain/ + DOE, whitish cough, PND, orthopnea Dermatological: No rash, lesions/masses Urologic: No hematuria, dysuria Abdominal:   No nausea, vomiting, diarrhea, bright red blood per rectum, melena, or hematemesis Neurologic:  No visual changes, changes in mental status. + weakness All other systems reviewed and are otherwise negative except as noted above.  Physical Exam  Blood pressure 130/61, pulse 96, temperature 97.1 F (36.2 C), temperature source Axillary, resp. rate 35, height  (1.626 m), weight 124 lb (56.246 kg), SpO2 100 %.   General:  Dyspneic at rest. On bipap HEENT: normal Neck: supple. JV elevated to jaw. Carotids 2+ bilat; no bruits. No lymphadenopathy or thryomegaly appreciated. Cor: PMI nondisplaced. Iregular rate & rhythm. Loud p2. 2/6 TR Lungs: diffuse crackles Abdomen: soft, nontender, + distended. No hepatosplenomegaly. No bruits or masses. Good bowel sounds. Extremities: no cyanosis, clubbing, rash, 2+ edema Neuro: alert & orientedx3, cranial nerves grossly intact. moves all 4 extremities w/o difficulty. Affect pleasant  Labs  Troponin (Point of Care Test) No results for input(s): TROPIPOC in the last 72 hours. No results for input(s): CKTOTAL, CKMB, TROPONINI in the last 72  hours. Lab Results  Component Value Date   WBC 15.0* 03/25/2015   HGB 13.2 03/25/2015   HCT 38.9 03/25/2015   MCV 83.3 03/25/2015   PLT 394 03/25/2015   No results for input(s): NA, K, CL, CO2, BUN, CREATININE, CALCIUM, PROT, BILITOT, ALKPHOS, ALT, AST, GLUCOSE in the last 168 hours.  Invalid input(s): LABALBU Lab Results  Component Value Date   CHOL 96 02/03/2015   HDL 26.30* 02/03/2015   LDLCALC 53 02/03/2015   TRIG 83.0 02/03/2015   Lab Results  Component Value Date   DDIMER 2.69* 07/12/2011     Radiology/Studies  Dg Chest 2 View  02/28/2015   CLINICAL DATA:  Shortness of breath and lower extremity pitting edema ; history of atrial fibrillation and asthma  EXAM: CHEST  2 VIEW  COMPARISON:  PA and lateral chest of Jan 17, 2015  FINDINGS: The lungs are mildly hyperinflated. The interstitial markings are increased. The pulmonary vascularity is engorged. The cardiac silhouette is mildly enlarged. A permanent pacemaker is in place with appropriate positioning of the electrodes. There is no pleural effusion. The bony thorax exhibits no acute abnormality.  IMPRESSION: Reactive airway disease with superimposed CHF and mild interstitial edema. There is no alveolar pneumonia.   Electronically Signed   By: David  [place] M.D.   On: 02/28/2015 14:17   Dg Wrist Complete Right  02/28/2015   CLINICAL DATA:  Wrist pain.  No known injury.  Initial evaluation.  EXAM: RIGHT WRIST - COMPLETE 3+ VIEW  COMPARISON:  None.  FINDINGS: Diffuse osteopenia and degenerative change. No acute bony or joint abnormality identified. No evidence of fracture or dislocation.  IMPRESSION: Negative.   Electronically Signed   By: Maisie Fus  Register   On: 02/28/2015 14:18   Ct Chest High Resolution  03/06/2015   CLINICAL DATA:  80 year old female with increasing shortness of breath over the past year. Clinical concern for interstitial lung disease. History of asthma. Currently on home oxygen.  EXAM: CT CHEST WITHOUT CONTRAST   TECHNIQUE: Multidetector CT imaging of the chest was performed following the standard protocol without intravenous contrast. High resolution imaging of the lungs, as well as inspiratory and expiratory imaging, was performed.  COMPARISON:  Chest CT 11/13/2013.  FINDINGS: Mediastinum/Lymph Nodes: Heart size is mildly enlarged. There  is no significant pericardial fluid, thickening or pericardial calcification. There is atherosclerosis of the thoracic aorta, the great vessels of the mediastinum and the coronary arteries, including calcified atherosclerotic plaque in the left main, left anterior descending, left circumflex and right coronary arteries. Mild calcifications of the aortic valve. Left-sided pacemaker device in position with lead tips terminating in the right atrium and the right ventricle near the apex. Multiple prominent borderline enlarged mediastinal and hilar lymph nodes are noted. Esophagus is unremarkable in appearance. No axillary lymphadenopathy.  Lungs/Pleura: High-resolution images demonstrate extensive patchy areas of ground-glass attenuation asymmetrically distributed in the lungs bilaterally (left greater than right), typically in a peribronchovascular distribution. This is associated with extensive thickening of the adjacent peribronchovascular interstitium, as well as septal thickening and some subpleural reticulation. In addition, there is a more peripherally located area of consolidation and airspace disease in the left upper lobe which is somewhat wedge-shaped. Scattered areas of mild cylindrical bronchiectasis are noted throughout these same regions. No frank honeycombing is confidently identified at this time. No clearly definable craniocaudal gradient, although there is more basilar involvement at this time. No large suspicious appearing pulmonary nodules or masses. Inspiratory and expiratory imaging demonstrates some mild air trapping, indicative of mild small airways disease. No pleural  effusions.  Upper Abdomen: Mild diffuse decreased attenuation throughout the hepatic parenchyma, indicative of mild hepatic steatosis. Atherosclerosis.  Musculoskeletal/Soft Tissues: There are no aggressive appearing lytic or blastic lesions noted in the visualized portions of the skeleton.  IMPRESSION: 1. Unusual appearance of the lung parenchyma, as discussed above. These findings could certainly reflect an underlying interstitial lung disease, but the pattern is nonspecific. At this time, findings are favored to reflect nonspecific interstitial pneumonia (NSIP), or potentially cryptogenic organizing pneumonia (COP). No definite evidence to suggest more aggressive process such as usual interstitial pneumonia (UIP) at this time. 2. The possibility of concurrent or residual infectious airspace consolidation is not excluded, particularly in the periphery of the left upper lobe. Clinical correlation for signs and symptoms of pneumonia is recommended. 3. For further evaluation of these findings, a repeat high-resolution chest CT is suggested in 6-12 months to assess for temporal changes in the appearance of the lung parenchyma if clinically appropriate. 4. Atherosclerosis, including left main and 3 vessel coronary artery disease. Assessment for potential risk factor modification, dietary therapy or pharmacologic therapy may be warranted, if clinically indicated. 5. Mild hepatic steatosis. 6. Additional incidental findings, as above.   Electronically Signed   By: Trudie Reed M.D.   On: 03/06/2015 14:11   US Venous Img Lower Unilateral Left  02/28/2015   CLINICAL DATA:  Left lower extremity swelling for 2 months.  EXAM: LEFT LOWER EXTREMITY VENOUS DOPPLER ULTRASOUND  TECHNIQUE: Gray-scale sonography with graded compression, as well as color Doppler and duplex ultrasound were performed to evaluate the lower extremity deep venous systems from the level of the common femoral vein and including the common femoral,  femoral, profunda femoral, popliteal and calf veins including the posterior tibial, peroneal and gastrocnemius veins when visible. The superficial great saphenous vein was also interrogated. Spectral Doppler was utilized to evaluate flow at rest and with distal augmentation maneuvers in the common femoral, femoral and popliteal veins.  COMPARISON:  None.  FINDINGS: Contralateral Common Femoral Vein: Respiratory phasicity is normal and symmetric with the symptomatic side. No evidence of thrombus. Normal compressibility.  Common Femoral Vein: No evidence of thrombus. Normal compressibility, respiratory phasicity and response to augmentation.  Saphenofemoral Junction: No evidence of  thrombus. Normal compressibility and flow on color Doppler imaging.  Profunda Femoral Vein: No evidence of thrombus. Normal compressibility and flow on color Doppler imaging.  Femoral Vein: No evidence of thrombus. Normal compressibility, respiratory phasicity and response to augmentation.  Popliteal Vein: No evidence of thrombus. Normal compressibility, respiratory phasicity and response to augmentation.  Calf Veins: No evidence of thrombus. Normal compressibility and flow on color Doppler imaging.  Superficial Great Saphenous Vein: No evidence of thrombus. Normal compressibility and flow on color Doppler imaging.  Venous Reflux:  None.  Other Findings:  None.  IMPRESSION: No evidence of deep venous thrombosis.   Electronically Signed   By: Myles Rosenthal M.D.   On: 02/28/2015 17:11   Dg Chest Portable 1 View  03/25/2015   CLINICAL DATA:  Sudden onset of respiratory distress  EXAM: PORTABLE CHEST - 1 VIEW  COMPARISON:  02/28/2015  FINDINGS: Cardiomediastinal silhouette is stable. There is central mild vascular congestion. Worsening interstitial prominence bilaterally. Findings highly suspicious for pulmonary edema or bilateral pneumonitis. Clinical correlation is necessary. Stable dual lead cardiac pacemaker position.  IMPRESSION: There is  central mild vascular congestion. Worsening interstitial prominence bilaterally. Findings highly suspicious for pulmonary edema or bilateral pneumonitis. Clinical correlation is necessary.   Electronically Signed   By: Natasha Mead M.D.   On: 03/25/2015 16:13    ECG  Atrial fibrillation 101 bpm   ASSESSMENT AND PLAN 1. A/c hypoxic respiratory failure 2. A/c chronic diastolic HF 3. PAH 4. Abnormal chest CT concerning for ILD 5. Permanent AF s/p PPM 6. Nonobstructive CAD   Signed, Robbie Lis, PA-C 03/25/2015, 5:13 PM  Patient seen and examined with Robbie Lis, PA-C. We discussed all aspects of the encounter. I agree with the assessment and plan as stated above.   She has recurrent respiratory failure which is likely multifactorial. Currently, biggest issue seems to be pulmonary edema. Agree with IV diuresis. Will need to monitor CXR for clearing of infiltrates. If not clearing with diuresis will have to consider the possibility of worsening pneumonitis or ILD.  Continue coumadin for AF. We will follow.   Xcaret Morad,MD 6:20 PM

## 2015-03-25 NOTE — ED Notes (Signed)
Pt given break from BiPAP due to skin redness on bridge of nose and left cheek.  Pt states she wears 4lpm N/C at home.  Pt placed on 6 lpm N/C with SpO2 of 93%.

## 2015-03-26 DIAGNOSIS — L8990 Pressure ulcer of unspecified site, unspecified stage: Secondary | ICD-10-CM

## 2015-03-26 DIAGNOSIS — J811 Chronic pulmonary edema: Secondary | ICD-10-CM | POA: Insufficient documentation

## 2015-03-26 DIAGNOSIS — I5031 Acute diastolic (congestive) heart failure: Secondary | ICD-10-CM

## 2015-03-26 DIAGNOSIS — I272 Other secondary pulmonary hypertension: Secondary | ICD-10-CM

## 2015-03-26 LAB — CBC WITH DIFFERENTIAL/PLATELET
Basophils Absolute: 0 10*3/uL (ref 0.0–0.1)
Basophils Relative: 0 % (ref 0–1)
Eosinophils Absolute: 0 10*3/uL (ref 0.0–0.7)
Eosinophils Relative: 0 % (ref 0–5)
HCT: 34.3 % — ABNORMAL LOW (ref 36.0–46.0)
Hemoglobin: 11.7 g/dL — ABNORMAL LOW (ref 12.0–15.0)
LYMPHS ABS: 0.6 10*3/uL — AB (ref 0.7–4.0)
LYMPHS PCT: 7 % — AB (ref 12–46)
MCH: 27.9 pg (ref 26.0–34.0)
MCHC: 34.1 g/dL (ref 30.0–36.0)
MCV: 81.7 fL (ref 78.0–100.0)
MONO ABS: 0.2 10*3/uL (ref 0.1–1.0)
MONOS PCT: 2 % — AB (ref 3–12)
Neutro Abs: 7.5 10*3/uL (ref 1.7–7.7)
Neutrophils Relative %: 91 % — ABNORMAL HIGH (ref 43–77)
PLATELETS: 380 10*3/uL (ref 150–400)
RBC: 4.2 MIL/uL (ref 3.87–5.11)
RDW: 15.6 % — ABNORMAL HIGH (ref 11.5–15.5)
WBC: 8.2 10*3/uL (ref 4.0–10.5)

## 2015-03-26 LAB — RENAL FUNCTION PANEL
ALBUMIN: 2.2 g/dL — AB (ref 3.5–5.0)
Anion gap: 7 (ref 5–15)
BUN: 8 mg/dL (ref 6–20)
CO2: 24 mmol/L (ref 22–32)
CREATININE: 0.55 mg/dL (ref 0.44–1.00)
Calcium: 7.7 mg/dL — ABNORMAL LOW (ref 8.9–10.3)
Chloride: 98 mmol/L — ABNORMAL LOW (ref 101–111)
GFR calc Af Amer: >60 mL/min (ref >60)
GFR calc non Af Amer: >60 mL/min (ref >60)
Glucose, Bld: 171 mg/dL — ABNORMAL HIGH (ref 65–99)
PHOSPHORUS: 3.8 mg/dL (ref 2.5–4.6)
Potassium: 3.5 mmol/L (ref 3.5–5.1)
Sodium: 129 mmol/L — ABNORMAL LOW (ref 135–145)

## 2015-03-26 LAB — MAGNESIUM: Magnesium: 1.8 mg/dL (ref 1.7–2.4)

## 2015-03-26 LAB — GLUCOSE, CAPILLARY
GLUCOSE-CAPILLARY: 180 mg/dL — AB (ref 65–99)
Glucose-Capillary: 123 mg/dL — ABNORMAL HIGH (ref 65–99)
Glucose-Capillary: 159 mg/dL — ABNORMAL HIGH (ref 65–99)
Glucose-Capillary: 134 mg/dL — ABNORMAL HIGH (ref 65–99)

## 2015-03-26 LAB — PROTIME-INR
INR: 1.86 — ABNORMAL HIGH (ref 0.00–1.49)
PROTHROMBIN TIME: 21.4 s — AB (ref 11.6–15.2)

## 2015-03-26 LAB — PROCALCITONIN: PROCALCITONIN: 0.27 ng/mL

## 2015-03-26 MED ORDER — ATORVASTATIN CALCIUM 10 MG PO TABS
10.0000 mg | ORAL_TABLET | Freq: Every day | ORAL | Status: DC
  Administered 2015-03-26 – 2015-04-02 (×8): 10 mg via ORAL
  Filled 2015-03-26 (×9): qty 1

## 2015-03-26 MED ORDER — INSULIN ASPART 100 UNIT/ML ~~LOC~~ SOLN
0.0000 [IU] | Freq: Three times a day (TID) | SUBCUTANEOUS | Status: DC
  Administered 2015-03-26: 2 [IU] via SUBCUTANEOUS
  Administered 2015-03-26 – 2015-03-27 (×3): 1 [IU] via SUBCUTANEOUS
  Administered 2015-03-27: 2 [IU] via SUBCUTANEOUS
  Administered 2015-03-28: 1 [IU] via SUBCUTANEOUS
  Administered 2015-03-28: 2 [IU] via SUBCUTANEOUS
  Administered 2015-03-29 – 2015-03-30 (×4): 0 [IU] via SUBCUTANEOUS
  Administered 2015-03-30 – 2015-03-31 (×2): 2 [IU] via SUBCUTANEOUS
  Administered 2015-04-01: 1 [IU] via SUBCUTANEOUS
  Administered 2015-04-01 – 2015-04-02 (×2): 2 [IU] via SUBCUTANEOUS

## 2015-03-26 MED ORDER — CHLORHEXIDINE GLUCONATE 0.12 % MT SOLN
15.0000 mL | Freq: Two times a day (BID) | OROMUCOSAL | Status: DC
  Administered 2015-03-26 – 2015-04-03 (×16): 15 mL via OROMUCOSAL
  Filled 2015-03-26 (×19): qty 15

## 2015-03-26 MED ORDER — MAGNESIUM SULFATE 2 GM/50ML IV SOLN
2.0000 g | Freq: Once | INTRAVENOUS | Status: DC
  Filled 2015-03-26: qty 50

## 2015-03-26 MED ORDER — WARFARIN SODIUM 2 MG PO TABS
2.0000 mg | ORAL_TABLET | Freq: Once | ORAL | Status: AC
  Administered 2015-03-26: 2 mg via ORAL
  Filled 2015-03-26: qty 1

## 2015-03-26 MED ORDER — CETYLPYRIDINIUM CHLORIDE 0.05 % MT LIQD
7.0000 mL | Freq: Two times a day (BID) | OROMUCOSAL | Status: DC
  Administered 2015-03-26 – 2015-04-03 (×16): 7 mL via OROMUCOSAL

## 2015-03-26 MED ORDER — POTASSIUM CHLORIDE CRYS ER 20 MEQ PO TBCR
40.0000 meq | EXTENDED_RELEASE_TABLET | Freq: Once | ORAL | Status: AC
  Administered 2015-03-26: 40 meq via ORAL
  Filled 2015-03-26 (×2): qty 2

## 2015-03-26 MED ORDER — LEVOFLOXACIN IN D5W 750 MG/150ML IV SOLN
750.0000 mg | INTRAVENOUS | Status: DC
  Administered 2015-03-26 – 2015-03-29 (×4): 750 mg via INTRAVENOUS
  Filled 2015-03-26 (×5): qty 150

## 2015-03-26 MED ORDER — FUROSEMIDE 10 MG/ML IJ SOLN
40.0000 mg | Freq: Two times a day (BID) | INTRAMUSCULAR | Status: DC
  Administered 2015-03-26 (×2): 40 mg via INTRAVENOUS
  Filled 2015-03-26 (×4): qty 4

## 2015-03-26 MED ORDER — WARFARIN - PHARMACIST DOSING INPATIENT
Freq: Every day | Status: DC
  Administered 2015-03-27: 1
  Administered 2015-03-31 – 2015-04-02 (×3)

## 2015-03-26 NOTE — Progress Notes (Signed)
ANTICOAGULATION CONSULT NOTE - Initial Consult  Pharmacy Consult for Warfarin  Indication: atrial fibrillation  Allergies  Allergen Reactions  . Benzoyl Peroxide Swelling    Swelling at the site of use  . Neomycin-Bacitracin Zn-Polymyx Swelling    Swelling at the site of use    Patient Measurements: Height: 5\' 4"  (162.6 cm) Weight: 123 lb 14.4 oz (56.2 kg) IBW/kg (Calculated) : 54.7  Vital Signs: Temp: 97.9 F (36.6 C) (08/03 1413) Temp Source: Axillary (08/03 1413) BP: 105/45 mmHg (08/03 1413) Pulse Rate: 87 (08/03 1413)  Labs:  Recent Labs  03/25/15 1649 03/25/15 1829 03/26/15 0232  HGB 13.2  --  11.7*  HCT 38.9  --  34.3*  PLT 394  --  380  LABPROT  --  21.8* 21.4*  INR  --  1.91* 1.86*  CREATININE 0.55  --  0.55  TROPONINI 0.12*  --   --     Estimated Creatinine Clearance: 50.9 mL/min (by C-G formula based on Cr of 0.55).   Medical History: Past Medical History  Diagnosis Date  . Asthma   . Rhinitis   . Osteoarthritis   . Osteoporosis   . Peptic ulcer disease   . Depression   . Atrial fibrillation     s/p ablation   . Takotsubo cardiomyopathy     resolved  . Shingles   . Eosinophilia   . Sinoatrial node dysfunction   . GERD (gastroesophageal reflux disease)   . Bronchiectasis   . Peptic ulcer disease   . Osteoarthritis   . Herpes zoster   . Hyperlipemia   . Injury of splenic artery     infarct  . Pulmonary hypertension     Medications:  Scheduled:  . antiseptic oral rinse  7 mL Mouth Rinse q12n4p  . atorvastatin  10 mg Oral q1800  . cefTAZidime (FORTAZ)  IV  2 g Intravenous Q8H  . chlorhexidine  15 mL Mouth Rinse BID  . escitalopram  10 mg Oral Daily  . furosemide  40 mg Intravenous BID  . insulin aspart  0-9 Units Subcutaneous TID WC  . magnesium sulfate 1 - 4 g bolus IVPB  2 g Intravenous Once  . methylPREDNISolone (SOLU-MEDROL) injection  40 mg Intravenous Q6H  . pantoprazole (PROTONIX) IV  40 mg Intravenous QHS  . Tadalafil  (PAH)  20 mg Oral Daily  . vancomycin  500 mg Intravenous Q12H  . warfarin  2 mg Oral ONCE-1800  . Warfarin - Pharmacist Dosing Inpatient   Does not apply q1800    Assessment: 79yoF amitted for acute chronic hypoxic resp failure. On warfarin PTA for A.fib.Warfarin resumed 8/3. Hgb 11.7, plt wnl. INR at admission (8/2) 1.91, INR today 1.86.   Goal of Therapy:  Monitor platelets by anticoagulation protocol: Yes  Goal INR 2-3   Plan:  Warfarin 2mg  x1 Daily INR, Hgb, Plt  Remi Haggard, PharmD Clinical Pharmacist- Resident Pager: [phone]  Remi Haggard 03/26/2015,2:17 PM

## 2015-03-26 NOTE — H&P (Signed)
PULMONARY / CRITICAL CARE MEDICINE   Name: [name] MRN: [id number] DOB: November 19, 1934    ADMISSION DATE:  03/25/2015 CONSULTATION DATE:  03/25/15  REFERRING MD :  ED  CHIEF COMPLAINT:  Acute on Chronic Hypoxic Respiratory Failure  INITIAL PRESENTATION: 80 year old female with known history of pulmonary hypertension started on tadalafil during last hospitalization with only mild nausea. Patient presents with subjective symptoms suggestive of an infectious etiology to her SIRS as well as acute on chronic hypoxic respiratory failure with physical exam findings of overt fluid overload.   STUDIES: PORTABLE CXR (03/25/15): Increased prominence of bilateral interstitial markings. No focal consolidation appreciated.  V/Q Scan (01/17/15): Patchy deposition of radiopharmaceutical in both lungs on ventilation. No wedge-shaped perfusion defects to suggest acute pulmonary embolism. Findings consistent with low probability for acute pulmonary embolism.  HRCT Chest (03/06/15): Findings suggestive of an SI pain bilaterally.  SIGNIFICANT EVENTS: 7/16 - Discharged from hospital 8/2 - BiPAP initiated 8/2 - Admitted  SUBJECTIVE: on O2 mask, no overt distress  VITAL SIGNS: Temp:  [97.1 F (36.2 C)-98 F (36.7 C)] 98 F (36.7 C) (08/03 0807) Pulse Rate:  [59-101] 60 (08/03 0700) Resp:  [18-43] 19 (08/03 0700) BP: (94-135)/(50-92) 94/54 mmHg (08/03 0700) SpO2:  [86 %-100 %] 97 % (08/03 0700) FiO2 (%):  [50 %] 50 % (08/03 0825) Weight:  [56.2 kg (123 lb 14.4 oz)-56.246 kg (124 lb)] 56.2 kg (123 lb 14.4 oz) (08/03 0344) HEMODYNAMICS:   VENTILATOR SETTINGS: Vent Mode:  [-]  FiO2 (%):  [50 %] 50 % INTAKE / OUTPUT:  Intake/Output Summary (Last 24 hours) at 03/26/15 0946 Last data filed at 03/26/15 0700  Gross per 24 hour  Intake    410 ml  Output   1200 ml  Net   -790 ml    PHYSICAL EXAMINATION: General: in bed on venti mask Neuro: awake, alert, O x 3, nonfocal HEENT: jvd noted PULM: coarse  crackles bialteral CV:  s1 s2 RRR no r GI: sopft, no pain, no r Extremities: edema limited   LABS:  CBC  Recent Labs Lab 03/25/15 1649 03/26/15 0232  WBC 15.0* 8.2  HGB 13.2 11.7*  HCT 38.9 34.3*  PLT 394 380   Coag's  Recent Labs Lab 03/25/15 1829 03/26/15 0232  INR 1.91* 1.86*   BMET  Recent Labs Lab 03/25/15 1649 03/26/15 0232  NA 127* 129*  K 4.1 3.5  CL 96* 98*  CO2 18* 24  BUN 11 8  CREATININE 0.55 0.55  GLUCOSE 129* 171*   Electrolytes  Recent Labs Lab 03/25/15 1649 03/25/15 1829 03/26/15 0232  CALCIUM 8.2*  --  7.7*  MG  --  1.1* 1.8  PHOS  --  2.8 3.8   Sepsis Markers  Recent Labs Lab 03/25/15 1829 03/26/15 0232  PROCALCITON 0.14 0.27   ABG  Recent Labs Lab 03/25/15 1641  PHART 7.432  PCO2ART 30.9*  PO2ART 149.0*   Liver Enzymes  Recent Labs Lab 03/26/15 0232  ALBUMIN 2.2*   Cardiac Enzymes  Recent Labs Lab 03/25/15 1649  TROPONINI 0.12*   Glucose  Recent Labs Lab 03/25/15 2008  GLUCAP 177*    Imaging Dg Chest Portable 1 View  03/25/2015   CLINICAL DATA:  Sudden onset of respiratory distress  EXAM: PORTABLE CHEST - 1 VIEW  COMPARISON:  02/28/2015  FINDINGS: Cardiomediastinal silhouette is stable. There is central mild vascular congestion. Worsening interstitial prominence bilaterally. Findings highly suspicious for pulmonary edema or bilateral pneumonitis. Clinical correlation is necessary. Stable  dual lead cardiac pacemaker position.  IMPRESSION: There is central mild vascular congestion. Worsening interstitial prominence bilaterally. Findings highly suspicious for pulmonary edema or bilateral pneumonitis. Clinical correlation is necessary.   Electronically Signed   By: Lahoma Crocker M.D.   On: 03/25/2015 16:13     ASSESSMENT / PLAN:  PULMONARY BiPAP 8/2 >> A: Acute on chronic hypoxic respiratory failure  Pulmonary hypertension   P:   Continuing BiPAP support,  Limit to prn as wob imporved Patient is DO  NOT INTUBATE, move to sdu Lasix IV per cards Restarting tadalafil  Monitor urine output closely Checking ESR- pending Solu-Medrol IV q6hr, maintain with some clinical progress Albuterol neb prn Have much more room for lasix and neg balance, was neg 800 cc, tolerated Avoid sats less 92%  CARDIOVASCULAR A: H/O Atrial fibrillation Pulmonary hypertension  P:  Restart Coumadin Monitor on telemetry tadalafil Needs further lasix  RENAL A:  Urinary retention Plenty of room for diuresis P:   Continue Foley catheter bme tin am  k supp Mag supp  GASTROINTESTINAL A:   No acute issues H/O Peptic Ulcer disease  P:   Start full iq dni Protonix IV daily  HEMATOLOGIC A:   Leukocytosis Chronic Coumadin  P:  INR stat Restart Coumadin cbc in am   INFECTIOUS A:   Sepsis - UTI versus pneumonia (HCAP) Bases on ct concerning P:   Procalcitonin algorithm- unimpressive Trending leukocytosis  BCx2 8/2 >> UC 8/2 >> Sputum Culture ?8/2>>   Abx:  Vancomycin, start date 8/2, day 1/x Fortaz, start date 8/2, day 1/x  In am if culture neg , narrow rapid to ceftriaxone Consider max 5-7 days abx  ENDOCRINE A:   steroids  P:   Monitor glucose on daily labs ssi addition  NEUROLOGIC A:   No acute issues  P:   Monitor for mental status changes  FAMILY  - Updates:  Husband at bedside and updated regarding plan of care. Done by me  - Inter-disciplinary family meet or Palliative Care meeting due by:  8/9   Ccm time 30 min   Lavon Paganini. Titus Mould, MD, [place] Pgr: [facility]

## 2015-03-26 NOTE — Progress Notes (Signed)
03/26/2015 Lenard Galloway from [facility] lab called at 1715 patient urine that was collected was positive for Legionella pneumophila serogroup 1. Dr Tyson Alias was called and was made aware. Shemaiah Round WellingtonRN.

## 2015-03-26 NOTE — Progress Notes (Addendum)
ANTIBIOTIC CONSULT NOTE - INITIAL  Pharmacy Consult for Levaquin Indication: CAP, legionella positive  Allergies  Allergen Reactions  . Benzoyl Peroxide Swelling    Swelling at the site of use  . Neomycin-Bacitracin Zn-Polymyx Swelling    Swelling at the site of use    Patient Measurements: Height:  (162.6 cm) Weight: 123 lb 14.4 oz (56.2 kg) IBW/kg (Calculated) : 54.7   Vital Signs: Temp: 97.9 F (36.6 C) (08/03 1413) Temp Source: Axillary (08/03 1413) BP: 98/48 mmHg (08/03 1556) Pulse Rate: 59 (08/03 1556) Intake/Output from previous day: 08/02 0701 - 08/03 0700 In: 410 [I.V.:110; IV Piggyback:300] Out: 1200 [Urine:1200] Intake/Output from this shift: Total I/O In: -  Out: 400 [Urine:400]  Labs:  Recent Labs  03/25/15 1649 03/26/15 0232  WBC 15.0* 8.2  HGB 13.2 11.7*  PLT 394 380  CREATININE 0.55 0.55   Estimated Creatinine Clearance: 50.9 mL/min (by C-G formula based on Cr of 0.55). No results for input(s): VANCOTROUGH, VANCOPEAK, VANCORANDOM, GENTTROUGH, GENTPEAK, GENTRANDOM, TOBRATROUGH, TOBRAPEAK, TOBRARND, AMIKACINPEAK, AMIKACINTROU, AMIKACIN in the last 72 hours.   Microbiology: Recent Results (from the past 720 hour(s))  MRSA PCR Screening     Status: None   Collection Time: 02/28/15 11:22 PM  Result Value Ref Range Status   MRSA by PCR NEGATIVE NEGATIVE Final    Comment:        The GeneXpert MRSA Assay (FDA approved for NASAL specimens only), is one component of a comprehensive MRSA colonization surveillance program. It is not intended to diagnose MRSA infection nor to guide or monitor treatment for MRSA infections.   Culture, Urine     Status: None (Preliminary result)   Collection Time: 03/25/15  6:12 PM  Result Value Ref Range Status   Specimen Description URINE, CATHETERIZED  Final   Special Requests Normal  Final   Culture CULTURE REINCUBATED FOR BETTER GROWTH  Final   Report Status PENDING  Incomplete  MRSA PCR Screening      Status: None   Collection Time: 03/25/15  8:04 PM  Result Value Ref Range Status   MRSA by PCR NEGATIVE NEGATIVE Final    Comment:        The GeneXpert MRSA Assay (FDA approved for NASAL specimens only), is one component of a comprehensive MRSA colonization surveillance program. It is not intended to diagnose MRSA infection nor to guide or monitor treatment for MRSA infections.   Culture, blood (routine x 2)     Status: None (Preliminary result)   Collection Time: 03/25/15  8:40 PM  Result Value Ref Range Status   Specimen Description BLOOD RIGHT ANTECUBITAL  Final   Special Requests BOTTLES DRAWN AEROBIC ONLY 6CC  Final   Culture NO GROWTH < 24 HOURS  Final   Report Status PENDING  Incomplete  Culture, blood (routine x 2)     Status: None (Preliminary result)   Collection Time: 03/25/15  8:45 PM  Result Value Ref Range Status   Specimen Description BLOOD LEFT HAND  Final   Special Requests BOTTLES DRAWN AEROBIC ONLY 5CC  Final   Culture NO GROWTH < 24 HOURS  Final   Report Status PENDING  Incomplete    Infusions:     Assessment: 80yo female with history of Afib, eosinophilia, GERD and cardiomyopathy presents with respiratory distress. Pharmacy was consulted to dose vancomycin and ceftazidime for suspected sepsis but now patient is positive for legionella antigen, antibiotics were narrowed to Levaquin by ccm.  No fevers noted, wbc down to  8.2, pct now trending up to 0.27. Scr normal at 0.55.  Goal of Therapy:  Resolution of infection  Plan:  D/c Vancomycin and Ceftazidime  Levaquin 750mg  q24 hours Follow up culture results, renal function and clinical course Follow INR closely as Levaquin can potentiate INR  Sheppard Coil PharmD., BCPS Clinical Pharmacist Pager [phone] 03/26/2015 4:21 PM

## 2015-03-26 NOTE — Progress Notes (Signed)
Report called to nurse, Gloriajean Dell. Patient is awake, alert, and oriented. Sats 95% on venti mask. Husband and son at bedside and will come with nurse. Belongings gathered and will be sent with family.

## 2015-03-26 NOTE — Progress Notes (Signed)
03/26/2015 Patient was transfer from [facility] to [facility] she is alert, oriented. Patient is on a venti mask and was satting in the mid 90's. She have bruising on left arm and and buttocks.  [facility] RN.

## 2015-03-26 NOTE — Care Management Note (Signed)
Case Management Note  Patient Details  Name: LONETTE SAVEL MRN: [id number] Date of Birth: Oct 01, 1934  Subjective/Objective:      Has been at home - [facility] with [facility]              Action/Plan:   Expected Discharge Date:                  Expected Discharge Plan:  Home w Home Health Services  In-House Referral:     Discharge planning Services     Post Acute Care Choice:    Choice offered to:     DME Arranged:    DME Agency:     HH Arranged:    HH Agency:     Status of Service:  In process, will continue to follow  Medicare Important Message Given:    Date Medicare IM Given:    Medicare IM give by:    Date Additional Medicare IM Given:    Additional Medicare Important Message give by:     If discussed at Long Length of Stay Meetings, dates discussed:    Additional Comments:  Vangie Bicker, RN 03/26/2015, 5:01 AM

## 2015-03-26 NOTE — Progress Notes (Signed)
Pt taken off bipap and placed on 50% venti mask.  Pt alert and oriented.  Sats 96%, no respiratoy distress noted.  BS clear and slightly diminished.  RT will continue to monitor.  RN updated

## 2015-03-26 NOTE — Progress Notes (Signed)
Patient ID: [name], female   DOB: 1935/05/19, 80 y.o.   MRN: [id number]   80 y.o. female with a past medical history significant for permanent atrial fibrillation with slow ventricular response, s/p dual chamber pacemaker programmed VVIR, moderate CAD by recent cath 03/05/15, bronchietasis, moderate obstructive lung disease, moderate PAH, remote Takotsubo with recovered LV function, chronic diastolic CHF, EF 40-98% on echo 12/2014  She has been followed by Dr. Delton Coombes, over the last year she has developed worsening dyspnea and hypoxemia, requiring O2 4L/min at rest. She has had abnormal rheumatological studies (RA and ANA), no evidence of pulmonary embolism or sleep apnea.   She was recently admitted 7/8-7/16 for Acute on chronic respiratory failure: multifactorial due toacute on chronic diastolic heart failure and pulmonary hypertension. After diuresis, R/LHC showed pulmonary arterial hypertension with high PVR and low cardiac index (2.1 by Fick and 1.8 by thermodilution). Filling pressures were low. It was suspected group 1 PAH related to rheumatological disease. LHC showed moderate CAD. She was treated with lasix and tadafil. CT scan showed ILD that was concerning for UIP. She was d/c to SNF.   She presented back to the ED with recurrent respiratory distress. She had labored breathing with RR at 35. She was placed on BiPAP. Pt given 0.25mg  ativan, 10mg  albuterol, 125mg  soul-medrol .5mg  atrovent, Lasix 40 mg IV x 1. CXR shows central vascular congestion. Worsening interstitial prominence bilaterally. Findings highly suspicious for pulmonary edema or bilateral pneumonitis. Procalcitonin not significantly elevated.    This morning, she remains on Bipap.  Says she is feeling better.  HR/BP stable.   Scheduled Meds: . antiseptic oral rinse  7 mL Mouth Rinse q12n4p  . cefTAZidime (FORTAZ)  IV  2 g Intravenous Q8H  . chlorhexidine  15 mL Mouth Rinse BID  . escitalopram  10 mg Oral Daily  .  furosemide  40 mg Intravenous BID  . magnesium sulfate 1 - 4 g bolus IVPB  2 g Intravenous Once  . methylPREDNISolone (SOLU-MEDROL) injection  40 mg Intravenous Q6H  . pantoprazole (PROTONIX) IV  40 mg Intravenous QHS  . potassium chloride  40 mEq Oral Once  . Tadalafil (PAH)  20 mg Oral Daily  . vancomycin  500 mg Intravenous Q12H   Continuous Infusions:  PRN Meds:.sodium chloride, albuterol   Filed Vitals:   03/26/15 0508 03/26/15 0600 03/26/15 0700 03/26/15 0807  BP:  104/75 94/54   Pulse:  59 60   Temp: 97.5 F (36.4 C)   98 F (36.7 C)  TempSrc: Oral   Axillary  Resp:  20 19   Height:      Weight:      SpO2:  100% 97%     Intake/Output Summary (Last 24 hours) at 03/26/15 0823 Last data filed at 03/26/15 0700  Gross per 24 hour  Intake    410 ml  Output   1200 ml  Net   -790 ml    LABS: Basic Metabolic Panel:  Recent Labs  11/91/47 1649 03/25/15 1829 03/26/15 0232  NA 127*  --  129*  K 4.1  --  3.5  CL 96*  --  98*  CO2 18*  --  24  GLUCOSE 129*  --  171*  BUN 11  --  8  CREATININE 0.55  --  0.55  CALCIUM 8.2*  --  7.7*  MG  --  1.1* 1.8  PHOS  --  2.8 3.8   Liver Function Tests:  Recent Labs  03/26/15 0232  ALBUMIN 2.2*   No results for input(s): LIPASE, AMYLASE in the last 72 hours. CBC:  Recent Labs  03/25/15 1649 03/26/15 0232  WBC 15.0* 8.2  NEUTROABS 13.5* 7.5  HGB 13.2 11.7*  HCT 38.9 34.3*  MCV 83.3 81.7  PLT 394 380   Cardiac Enzymes:  Recent Labs  03/25/15 1649  TROPONINI 0.12*   BNP: Invalid input(s): POCBNP D-Dimer: No results for input(s): DDIMER in the last 72 hours. Hemoglobin A1C: No results for input(s): HGBA1C in the last 72 hours. Fasting Lipid Panel: No results for input(s): CHOL, HDL, LDLCALC, TRIG, CHOLHDL, LDLDIRECT in the last 72 hours. Thyroid Function Tests: No results for input(s): TSH, T4TOTAL, T3FREE, THYROIDAB in the last 72 hours.  Invalid input(s): FREET3 Anemia Panel: No results for  input(s): VITAMINB12, FOLATE, FERRITIN, TIBC, IRON, RETICCTPCT in the last 72 hours.  RADIOLOGY: Dg Chest 2 View  02/28/2015   CLINICAL DATA:  Shortness of breath and lower extremity pitting edema ; history of atrial fibrillation and asthma  EXAM: CHEST  2 VIEW  COMPARISON:  PA and lateral chest of Jan 17, 2015  FINDINGS: The lungs are mildly hyperinflated. The interstitial markings are increased. The pulmonary vascularity is engorged. The cardiac silhouette is mildly enlarged. A permanent pacemaker is in place with appropriate positioning of the electrodes. There is no pleural effusion. The bony thorax exhibits no acute abnormality.  IMPRESSION: Reactive airway disease with superimposed CHF and mild interstitial edema. There is no alveolar pneumonia.   Electronically Signed   By: David  [place] M.D.   On: 02/28/2015 14:17   Dg Wrist Complete Right  02/28/2015   CLINICAL DATA:  Wrist pain.  No known injury.  Initial evaluation.  EXAM: RIGHT WRIST - COMPLETE 3+ VIEW  COMPARISON:  None.  FINDINGS: Diffuse osteopenia and degenerative change. No acute bony or joint abnormality identified. No evidence of fracture or dislocation.  IMPRESSION: Negative.   Electronically Signed   By: Maisie Fus  Register   On: 02/28/2015 14:18   Ct Chest High Resolution  03/06/2015   CLINICAL DATA:  80 year old female with increasing shortness of breath over the past year. Clinical concern for interstitial lung disease. History of asthma. Currently on home oxygen.  EXAM: CT CHEST WITHOUT CONTRAST  TECHNIQUE: Multidetector CT imaging of the chest was performed following the standard protocol without intravenous contrast. High resolution imaging of the lungs, as well as inspiratory and expiratory imaging, was performed.  COMPARISON:  Chest CT 11/13/2013.  FINDINGS: Mediastinum/Lymph Nodes: Heart size is mildly enlarged. There is no significant pericardial fluid, thickening or pericardial calcification. There is atherosclerosis of the  thoracic aorta, the great vessels of the mediastinum and the coronary arteries, including calcified atherosclerotic plaque in the left main, left anterior descending, left circumflex and right coronary arteries. Mild calcifications of the aortic valve. Left-sided pacemaker device in position with lead tips terminating in the right atrium and the right ventricle near the apex. Multiple prominent borderline enlarged mediastinal and hilar lymph nodes are noted. Esophagus is unremarkable in appearance. No axillary lymphadenopathy.  Lungs/Pleura: High-resolution images demonstrate extensive patchy areas of ground-glass attenuation asymmetrically distributed in the lungs bilaterally (left greater than right), typically in a peribronchovascular distribution. This is associated with extensive thickening of the adjacent peribronchovascular interstitium, as well as septal thickening and some subpleural reticulation. In addition, there is a more peripherally located area of consolidation and airspace disease in the left upper lobe which is somewhat wedge-shaped. Scattered areas of mild cylindrical  bronchiectasis are noted throughout these same regions. No frank honeycombing is confidently identified at this time. No clearly definable craniocaudal gradient, although there is more basilar involvement at this time. No large suspicious appearing pulmonary nodules or masses. Inspiratory and expiratory imaging demonstrates some mild air trapping, indicative of mild small airways disease. No pleural effusions.  Upper Abdomen: Mild diffuse decreased attenuation throughout the hepatic parenchyma, indicative of mild hepatic steatosis. Atherosclerosis.  Musculoskeletal/Soft Tissues: There are no aggressive appearing lytic or blastic lesions noted in the visualized portions of the skeleton.  IMPRESSION: 1. Unusual appearance of the lung parenchyma, as discussed above. These findings could certainly reflect an underlying interstitial lung  disease, but the pattern is nonspecific. At this time, findings are favored to reflect nonspecific interstitial pneumonia (NSIP), or potentially cryptogenic organizing pneumonia (COP). No definite evidence to suggest more aggressive process such as usual interstitial pneumonia (UIP) at this time. 2. The possibility of concurrent or residual infectious airspace consolidation is not excluded, particularly in the periphery of the left upper lobe. Clinical correlation for signs and symptoms of pneumonia is recommended. 3. For further evaluation of these findings, a repeat high-resolution chest CT is suggested in 6-12 months to assess for temporal changes in the appearance of the lung parenchyma if clinically appropriate. 4. Atherosclerosis, including left main and 3 vessel coronary artery disease. Assessment for potential risk factor modification, dietary therapy or pharmacologic therapy may be warranted, if clinically indicated. 5. Mild hepatic steatosis. 6. Additional incidental findings, as above.   Electronically Signed   By: Trudie Reed M.D.   On: 03/06/2015 14:11   US Venous Img Lower Unilateral Left  02/28/2015   CLINICAL DATA:  Left lower extremity swelling for 2 months.  EXAM: LEFT LOWER EXTREMITY VENOUS DOPPLER ULTRASOUND  TECHNIQUE: Gray-scale sonography with graded compression, as well as color Doppler and duplex ultrasound were performed to evaluate the lower extremity deep venous systems from the level of the common femoral vein and including the common femoral, femoral, profunda femoral, popliteal and calf veins including the posterior tibial, peroneal and gastrocnemius veins when visible. The superficial great saphenous vein was also interrogated. Spectral Doppler was utilized to evaluate flow at rest and with distal augmentation maneuvers in the common femoral, femoral and popliteal veins.  COMPARISON:  None.  FINDINGS: Contralateral Common Femoral Vein: Respiratory phasicity is normal and  symmetric with the symptomatic side. No evidence of thrombus. Normal compressibility.  Common Femoral Vein: No evidence of thrombus. Normal compressibility, respiratory phasicity and response to augmentation.  Saphenofemoral Junction: No evidence of thrombus. Normal compressibility and flow on color Doppler imaging.  Profunda Femoral Vein: No evidence of thrombus. Normal compressibility and flow on color Doppler imaging.  Femoral Vein: No evidence of thrombus. Normal compressibility, respiratory phasicity and response to augmentation.  Popliteal Vein: No evidence of thrombus. Normal compressibility, respiratory phasicity and response to augmentation.  Calf Veins: No evidence of thrombus. Normal compressibility and flow on color Doppler imaging.  Superficial Great Saphenous Vein: No evidence of thrombus. Normal compressibility and flow on color Doppler imaging.  Venous Reflux:  None.  Other Findings:  None.  IMPRESSION: No evidence of deep venous thrombosis.   Electronically Signed   By: Myles Rosenthal M.D.   On: 02/28/2015 17:11   Dg Chest Portable 1 View  03/25/2015   CLINICAL DATA:  Sudden onset of respiratory distress  EXAM: PORTABLE CHEST - 1 VIEW  COMPARISON:  02/28/2015  FINDINGS: Cardiomediastinal silhouette is stable. There is central mild vascular  congestion. Worsening interstitial prominence bilaterally. Findings highly suspicious for pulmonary edema or bilateral pneumonitis. Clinical correlation is necessary. Stable dual lead cardiac pacemaker position.  IMPRESSION: There is central mild vascular congestion. Worsening interstitial prominence bilaterally. Findings highly suspicious for pulmonary edema or bilateral pneumonitis. Clinical correlation is necessary.   Electronically Signed   By: Natasha Mead M.D.   On: 03/25/2015 16:13    PHYSICAL EXAM General: NAD, on Bipap Neck: JVP 9-10 cm, no thyromegaly or thyroid nodule.  Lungs: Dependent crackles CV: Nondisplaced PMI.  Heart irregular S1/S2, no  S3/S4, no murmur.  No peripheral edema.   Abdomen: Soft, nontender, no hepatosplenomegaly, no distention.  Neurologic: Alert and oriented x 3.  Psych: Normal affect. Extremities: No clubbing or cyanosis.   TELEMETRY: Reviewed telemetry pt in atrial fibrillation, controlled rate  ASSESSMENT AND PLAN: 80 yo with history of permanent atrial fibrillation, sinus node dysfunction s/p MDT PPM, Takotsubo CMP (resolved, 9/07), idiopathic eosinophilia, bronchiectasis with mild obstructive PFTs, CAD with moderate LAD and RCA disease by cath in 2012, diastolic CHF and PAH was admitted with recurrent acute hypoxemic respiratory failure. She has been slowly worsening over the last few months. She has been on 4 L home oxygen.  1. Acute on chronic hypoxemic respiratory failure: Recurrent, now on Bipap. CXR concerning for pulmonary edema versus ILD flare/pneumonitis.  PCT not particularly elevated, less likely infectious.  - Getting IV Solumedrol. - Continue diuresis.  - Covering with antibiotics.   2. Acute on chronic diastolic CHF: EF 16-10% on 5/16 echo with PASP 66 mmHg.Filling pressures low on recent RHC after diuresis at last admission, now appears to have some volume overload.  She was on Lasix 20 mg daily at home.  Suspect she has a narrow therapeutic window.  Restart Lasix 40 mg IV bid today.  Replace K and Mg.   3. Pulmonary hypertension: Significant PAH with marginal cardiac output (albeit in setting of hypovolemia) on recent RHC, possibly group 1 related to rheumatological disease. RF was positive and ANA weakly positive (with negative anti-dsDNA). She has a history of bronchiectasis with mild obstructive PFTs. High resolution CT chest last admission showed interstitial lung disease (?UIP pattern, ?ILD related to rheumatological disease). V/Q scan in 5/16 did not show acute or chronic PE. She was on Tadalafil at last admission.  - Needs rheum workup: ?RA => will send CCP antibody (re-order,  does not appear to have every been sent last admission).  - She continues on Tadalafil.   Based on most recent trial data (AMBITION), would like to get her on ERA soon as well.  4. CAD: Moderate CAD on coronary angiography on 7/13. Continue atorvastatin. Not on ASA as she has been on warfarin.  5. Atrial fibrillation: Permanent it appears at this point. HR not elevated, not on rate control meds. Warfarin resumed. 6. PPM: MDT, h/o SN dysfunction.  7. H/o Takotsubo CMP: resolved.   35 minutes critical care time.   Marca Ancona 03/26/2015 8:37 AM

## 2015-03-26 NOTE — Progress Notes (Signed)
Pt placed on bipap for rest.  Tolerating well. RT will assess patient in 2 hours.

## 2015-03-26 NOTE — Telephone Encounter (Signed)
Specialty meds have to come from specialty pharmacy, have contacted [facility] to f/u, med was approved on 03/15/15

## 2015-03-27 ENCOUNTER — Inpatient Hospital Stay (HOSPITAL_COMMUNITY): Payer: Commercial Managed Care - HMO

## 2015-03-27 DIAGNOSIS — J810 Acute pulmonary edema: Secondary | ICD-10-CM

## 2015-03-27 LAB — GLUCOSE, CAPILLARY
GLUCOSE-CAPILLARY: 112 mg/dL — AB (ref 65–99)
Glucose-Capillary: 121 mg/dL — ABNORMAL HIGH (ref 65–99)
Glucose-Capillary: 157 mg/dL — ABNORMAL HIGH (ref 65–99)
Glucose-Capillary: 124 mg/dL — ABNORMAL HIGH (ref 65–99)

## 2015-03-27 LAB — CBC WITH DIFFERENTIAL/PLATELET
BASOS ABS: 0 10*3/uL (ref 0.0–0.1)
Basophils Relative: 0 % (ref 0–1)
EOS ABS: 0 10*3/uL (ref 0.0–0.7)
Eosinophils Relative: 0 % (ref 0–5)
HCT: 35.1 % — ABNORMAL LOW (ref 36.0–46.0)
Hemoglobin: 11.9 g/dL — ABNORMAL LOW (ref 12.0–15.0)
LYMPHS PCT: 5 % — AB (ref 12–46)
Lymphs Abs: 0.6 10*3/uL — ABNORMAL LOW (ref 0.7–4.0)
MCH: 28.5 pg (ref 26.0–34.0)
MCHC: 33.9 g/dL (ref 30.0–36.0)
MCV: 84.2 fL (ref 78.0–100.0)
MONO ABS: 0.5 10*3/uL (ref 0.1–1.0)
Monocytes Relative: 4 % (ref 3–12)
Neutro Abs: 12 10*3/uL — ABNORMAL HIGH (ref 1.7–7.7)
Neutrophils Relative %: 91 % — ABNORMAL HIGH (ref 43–77)
PLATELETS: 435 10*3/uL — AB (ref 150–400)
RBC: 4.17 MIL/uL (ref 3.87–5.11)
RDW: 15.8 % — ABNORMAL HIGH (ref 11.5–15.5)
WBC: 13.1 10*3/uL — ABNORMAL HIGH (ref 4.0–10.5)

## 2015-03-27 LAB — COMPREHENSIVE METABOLIC PANEL
ALBUMIN: 2.1 g/dL — AB (ref 3.5–5.0)
ALT: 79 U/L — ABNORMAL HIGH (ref 14–54)
AST: 79 U/L — AB (ref 15–41)
Alkaline Phosphatase: 116 U/L (ref 38–126)
Anion gap: 5 (ref 5–15)
BILIRUBIN TOTAL: 0.8 mg/dL (ref 0.3–1.2)
BUN: 15 mg/dL (ref 6–20)
CALCIUM: 7.8 mg/dL — AB (ref 8.9–10.3)
CO2: 27 mmol/L (ref 22–32)
Chloride: 100 mmol/L — ABNORMAL LOW (ref 101–111)
Creatinine, Ser: 0.56 mg/dL (ref 0.44–1.00)
GFR calc Af Amer: >60 mL/min (ref >60)
GFR calc non Af Amer: >60 mL/min (ref >60)
GLUCOSE: 130 mg/dL — AB (ref 65–99)
Potassium: 3.8 mmol/L (ref 3.5–5.1)
Sodium: 132 mmol/L — ABNORMAL LOW (ref 135–145)
TOTAL PROTEIN: 5.8 g/dL — AB (ref 6.5–8.1)

## 2015-03-27 LAB — PROCALCITONIN: Procalcitonin: 0.15 ng/mL

## 2015-03-27 LAB — PROTIME-INR
INR: 2.15 — ABNORMAL HIGH (ref 0.00–1.49)
PROTHROMBIN TIME: 23.8 s — AB (ref 11.6–15.2)

## 2015-03-27 MED ORDER — POTASSIUM CHLORIDE CRYS ER 20 MEQ PO TBCR
40.0000 meq | EXTENDED_RELEASE_TABLET | Freq: Once | ORAL | Status: AC
  Administered 2015-03-27: 40 meq via ORAL
  Filled 2015-03-27: qty 2

## 2015-03-27 MED ORDER — WARFARIN SODIUM 2 MG PO TABS
2.0000 mg | ORAL_TABLET | Freq: Once | ORAL | Status: AC
  Administered 2015-03-27: 2 mg via ORAL
  Filled 2015-03-27: qty 1

## 2015-03-27 MED ORDER — TRAZODONE HCL 100 MG PO TABS
100.0000 mg | ORAL_TABLET | Freq: Every evening | ORAL | Status: DC | PRN
  Administered 2015-03-28 – 2015-04-01 (×6): 100 mg via ORAL
  Filled 2015-03-27 (×9): qty 1

## 2015-03-27 MED ORDER — FUROSEMIDE 10 MG/ML IJ SOLN
80.0000 mg | Freq: Two times a day (BID) | INTRAMUSCULAR | Status: DC
  Administered 2015-03-27 – 2015-04-03 (×15): 80 mg via INTRAVENOUS
  Filled 2015-03-27 (×17): qty 8

## 2015-03-27 NOTE — Care Management Note (Addendum)
Case Management Note  Patient Details  Name: [name] MRN: [id number] Date of Birth: Oct 26, 1934  Subjective/Objective:     Adm w resp failure.               Action/Plan: lives w husband, had been at [facility] for rehab and came to hosp from there. She does not plan on returning to [facility]. She wants to go home w husband and hhc. No hx of hhc agency. No pref. agrreable to [facility] for [facility]ri -[facility]n. She has home o2 w [facility].   Expected Discharge Date:                  Expected Discharge Plan:  Home w Home Health Services  In-House Referral:     Discharge planning Services  CM Consult  Post Acute Care Choice:    Choice offered to:     DME Arranged:    DME Agency:  [facility]  HH Arranged:  RN, Disease Management HH Agency:     Status of Service:  Completed, signed off  Medicare Important Message Given:    Date Medicare IM Given:    Medicare IM give by:    Date Additional Medicare IM Given:    Additional Medicare Important Message give by:     If discussed at Long Length of Stay Meetings, dates discussed:    Additional Comments:ref to far[facility] w [facility]. Husband also plans to sign up w thn for community case magt nse. Has home o2 w [facility].  Hanley Hays, RN 03/27/2015, 11:51 AM

## 2015-03-27 NOTE — Progress Notes (Signed)
ANTICOAGULATION CONSULT NOTE - Follow-up  Pharmacy Consult for Warfarin  Indication: atrial fibrillation  Allergies  Allergen Reactions  . Benzoyl Peroxide Swelling    Swelling at the site of use  . Neomycin-Bacitracin Zn-Polymyx Swelling    Swelling at the site of use    Patient Measurements: Height: 5\' 4"  (162.6 cm) Weight: 127 lb 3.3 oz (57.7 kg) IBW/kg (Calculated) : 54.7  Vital Signs: Temp: 96.8 F (36 C) (08/04 0835) Temp Source: Axillary (08/04 0835) BP: 117/61 mmHg (08/04 0835) Pulse Rate: 83 (08/04 0835)  Labs:  Recent Labs  03/25/15 1649 03/25/15 1829 03/26/15 0232 03/27/15 0224  HGB 13.2  --  11.7* 11.9*  HCT 38.9  --  34.3* 35.1*  PLT 394  --  380 435*  LABPROT  --  21.8* 21.4* 23.8*  INR  --  1.91* 1.86* 2.15*  CREATININE 0.55  --  0.55 0.56  TROPONINI 0.12*  --   --   --     Estimated Creatinine Clearance: 50.9 mL/min (by C-G formula based on Cr of 0.56).  Assessment: 79yoF amitted for acute chronic hypoxic resp failure. She continues on chronic coumadin for history afib. INR is therapeutic at 2.15. H/H low but stable from yesterday. No bleeding noted. Of note, pt was started on levaquin which can increase the INR  Goal of Therapy:  Goal INR 2-3   Plan:  - Repeat warfarin 2mg  PO x 1 tonight - Daily INR  Lysle Pearl, PharmD, BCPS Pager # [phone] 03/27/2015 10:54 AM

## 2015-03-27 NOTE — Consult Note (Signed)
   [facility] CM Inpatient Consult   03/27/2015  [name] Aug 12, 1935 [id number] Referra;l received in LLOS meeting.  Patient is currently active [prior to admission]  with [facility] Care Management for chronic disease management services. Patient was admitted from skilled nursing facility with respiratory distress.  Patient has been engaged by a [facility] Care Management CSW fpor transition of care from skilled facility to her planned return home.  Patient will receive a post discharge transition of care call and will be evaluated for monthly home visits for assessments and disease process education. Inpatient RNCM aware of [facility] Care Management from LLOS meeting today.  Of note, [facility] Care Management services does not replace or interfere with any services that are arranged by inpatient case management or social work.  For additional questions or referrals please contact: Charlesetta Shanks, RN BSN CCM [facility]  [phone] business mobile phone

## 2015-03-27 NOTE — Progress Notes (Signed)
Initial Nutrition Assessment  DOCUMENTATION CODES:   Non-severe (moderate) malnutrition in context of acute illness/injury  INTERVENTION:   Ensure Enlive po BID, each supplement provides 350 kcal and 20 grams of protein  NUTRITION DIAGNOSIS:   Increased nutrient needs related to acute illness, wound healing as evidenced by estimated needs  GOAL:   Patient will meet greater than or equal to 90% of their needs  MONITOR:   PO intake, Supplement acceptance, Labs, Weight trends, I & O's  REASON FOR ASSESSMENT:   Malnutrition Screening Tool  ASSESSMENT:   80 y.o. Female with known history of pulmonary HTN started on tadalafil during last hospitalization with only mild nausea. Patient presents with subjective symptoms suggestive of an infectious etiology to her SIRS as well as acute on chronic hypoxic respiratory failure with physical exam findings of overt fluid overload.  RD unable to obtain nutrition hx.  Pt seen per Clinical Nutrition during previous admission in July 2016.  Identified with malnutrition which is ongoing.  Pt on BiPAP prn.  Would benefit from addition of oral nutrition supplements.  RD to order.  Nutrition-Focused physical exam completed 03/01/15. Findings were mild to moderate fat depletion with moderate edema.   Diet Order:  Diet full liquid Room service appropriate?: Yes; Fluid consistency:: Thin  Skin:  Wound (see comment) (Stage II to buttocks)  Last BM:  8/3  Height:   Ht Readings from Last 1 Encounters:  03/25/15 5\' 4"  (1.626 m)    Weight:   Wt Readings from Last 1 Encounters:  03/27/15 127 lb 3.3 oz (57.7 kg)    Ideal Body Weight:  54.5 kg  BMI:  Body mass index is 21.82 kg/(m^2).  Estimated Nutritional Needs:   Kcal:  1500-1700  Protein:  70-80 gm  Fluid:  1.5-1.7 L  EDUCATION NEEDS:   No education needs identified at this time  Maureen Chatters, RD, LDN Pager #: [phone] After-Hours Pager #: [phone]

## 2015-03-27 NOTE — Progress Notes (Signed)
PULMONARY / CRITICAL CARE MEDICINE   Name: [name] MRN: [id number] DOB: 01-Mar-1935    ADMISSION DATE:  03/25/2015 CONSULTATION DATE:  03/25/15  REFERRING MD :  ED  CHIEF COMPLAINT:  Acute on Chronic Hypoxic Respiratory Failure  INITIAL PRESENTATION: 80 year old female with known history of pulmonary hypertension started on tadalafil during last hospitalization with only mild nausea. Patient presents with subjective symptoms suggestive of an infectious etiology to her SIRS as well as acute on chronic hypoxic respiratory failure with physical exam findings of overt fluid overload.   STUDIES: PORTABLE CXR (03/25/15): Increased prominence of bilateral interstitial markings. No focal consolidation appreciated.  V/Q Scan (01/17/15): Patchy deposition of radiopharmaceutical in both lungs on ventilation. No wedge-shaped perfusion defects to suggest acute pulmonary embolism. Findings consistent with low probability for acute pulmonary embolism.  HRCT Chest (03/06/15): Findings suggestive of an SI pain bilaterally.  SIGNIFICANT EVENTS: 7/16 - Discharged from hospital 8/2 - BiPAP initiated 8/2 - Admitted  SUBJECTIVE: More SOB this am.  On 50% VM with sats 87%. Denies chest pain.   VITAL SIGNS: Temp:  [96.8 F (36 C)-97.9 F (36.6 C)] 96.8 F (36 C) (08/04 0835) Pulse Rate:  [55-90] 83 (08/04 0835) Resp:  [14-32] 26 (08/04 0835) BP: (91-122)/(45-61) 117/61 mmHg (08/04 0835) SpO2:  [90 %-99 %] 92 % (08/04 0835) FiO2 (%):  [55 %] 55 % (08/04 0745) Weight:  [127 lb 3.3 oz (57.7 kg)] 127 lb 3.3 oz (57.7 kg) (08/04 0437) HEMODYNAMICS:   VENTILATOR SETTINGS: Vent Mode:  [-]  FiO2 (%):  [55 %] 55 % INTAKE / OUTPUT:  Intake/Output Summary (Last 24 hours) at 03/27/15 1146 Last data filed at 03/27/15 0700  Gross per 24 hour  Intake    130 ml  Output    625 ml  Net   -495 ml    PHYSICAL EXAMINATION: General: in bed on venti mask, mild distress  Neuro: awake, alert, O x 3,  nonfocal HEENT: jvd noted PULM: resps even, shallow, mildly labored, coarse crackles bialteral CV:  s1 s2 RRR no r GI: sopft, no pain, no r Extremities: no sig BLE edema    LABS:  CBC  Recent Labs Lab 03/25/15 1649 03/26/15 0232 03/27/15 0224  WBC 15.0* 8.2 13.1*  HGB 13.2 11.7* 11.9*  HCT 38.9 34.3* 35.1*  PLT 394 380 435*   Coag's  Recent Labs Lab 03/25/15 1829 03/26/15 0232 03/27/15 0224  INR 1.91* 1.86* 2.15*   BMET  Recent Labs Lab 03/25/15 1649 03/26/15 0232 03/27/15 0224  NA 127* 129* 132*  K 4.1 3.5 3.8  CL 96* 98* 100*  CO2 18* 24 27  BUN _0 CREATININE 0.55 0.55 0.56  GLUCOSE 129* 171* 130*   Electrolytes  Recent Labs Lab 03/25/15 1649 03/25/15 1829 03/26/15 0232 03/27/15 0224  CALCIUM 8.2*  --  7.7* 7.8*  MG  --  1.1* 1.8  --   PHOS  --  2.8 3.8  --    Sepsis Markers  Recent Labs Lab 03/25/15 1829 03/26/15 0232 03/27/15 0224  PROCALCITON 0.14 0.27 0.15   ABG  Recent Labs Lab 03/25/15 1641  PHART 7.432  PCO2ART 30.9*  PO2ART 149.0*   Liver Enzymes  Recent Labs Lab 03/26/15 0232 03/27/15 0224  AST  --  79*  ALT  --  79*  ALKPHOS  --  116  BILITOT  --  0.8  ALBUMIN 2.2* 2.1*   Cardiac Enzymes  Recent Labs Lab 03/25/15 1649  TROPONINI  0.12*   Glucose  Recent Labs Lab 03/25/15 2008 03/26/15 1317 03/26/15 1411 03/26/15 1652 03/26/15 2123 03/27/15 0840  GLUCAP 177* 123* 180* 159* 134* 121*    Imaging Dg Chest Port 1 View  03/27/2015   CLINICAL DATA:  Pulmonary edema.  EXAM: PORTABLE CHEST - 1 VIEW  COMPARISON:  03/25/2015.  FINDINGS: Mediastinum hilar structures normal. Cardiac pacer noted in stable position. Persistent bilateral unchanged airspace disease consistent with bilateral pulmonary edema and/or pneumonia. Small left pleural effusion. No pneumothorax.  IMPRESSION: 1. Persistent bilateral diffuse airspace disease consistent with pulmonary edema and/or pneumonia. Small left pleural effusion.  2. Stable cardiac pacer position.Heart size stable.   Electronically Signed   By: Marcello Moores  Register   On: 03/27/2015 06:56     ASSESSMENT / PLAN:  PULMONARY BiPAP 8/2 >> A: Acute on chronic hypoxic respiratory failure  Pulmonary hypertension  ?legionella PNA P:   Continuing BiPAP PRN  Patient is DO NOT INTUBATE, move to sdu More aggressive diuresis 8/4 per cards  Restarting tadalafil  ESR- pending Continue solumedrol  Albuterol neb prn F/u CXR  pulm hygiene  Empiric levaquin as below   CARDIOVASCULAR A: H/O Atrial fibrillation Pulmonary hypertension Pulmonary edema  P:  Resume home Coumadin Monitor on telemetry tadalafil Continue lasix per cards   RENAL A:  Urinary retention Hyponatremia - mild  P:   Continue Foley catheter F/u chem     GASTROINTESTINAL A:   No acute issues H/O Peptic Ulcer disease  P:   Cont clear liquids  Protonix IV daily  HEMATOLOGIC A:   Leukocytosis Chronic Coumadin  P:  Cont home Coumadin F/u CBC   INFECTIOUS A:   Sepsis - UTI versus pneumonia (HCAP) ?legionella PNA - ?true pathogen.  From SNF P:   Procalcitonin algorithm- unimpressive Trending leukocytosis  BCx2 8/2 >> UC 8/2 >> Sputum Culture ?8/2>> Legionella antigen >> POS   Abx:  Vancomycin 8/2>>>8/3 Fortaz, start date 8/2>>>8/3 Levaquin 8/3>>>  Consider max 5-7 days abx  ENDOCRINE A:   steroids P:   Monitor glucose on daily labs SSI   NEUROLOGIC A:   No acute issues P:   Monitor for mental status changes   FAMILY  - Updates:  Husband updated 8/4  - Inter-disciplinary family meet or Palliative Care meeting due by:  8/9    Nickolas Madrid, NP 03/27/2015  11:46 AM Pager: [phone] or [phone]

## 2015-03-27 NOTE — Progress Notes (Signed)
Patient ID: [name], female   DOB: Feb 27, 1935, 80 y.o.   MRN: [id number]   80 y.o. female with a past medical history significant for permanent atrial fibrillation with slow ventricular response, s/p dual chamber pacemaker programmed VVIR, moderate CAD by recent cath 03/05/15, bronchietasis, moderate obstructive lung disease, moderate PAH, remote Takotsubo with recovered LV function, chronic diastolic CHF, EF 40-98% on echo 12/2014  She has been followed by Dr. Delton Coombes, over the last year she has developed worsening dyspnea and hypoxemia, requiring O2 4L/min at rest. She has had abnormal rheumatological studies (RA and ANA), no evidence of pulmonary embolism or sleep apnea.   She was recently admitted 7/8-7/16 for Acute on chronic respiratory failure: multifactorial due toacute on chronic diastolic heart failure and pulmonary hypertension. After diuresis, R/LHC showed pulmonary arterial hypertension with high PVR and low cardiac index (2.1 by Fick and 1.8 by thermodilution). Filling pressures were low. It was suspected group 1 PAH related to rheumatological disease. LHC showed moderate CAD. She was treated with lasix and tadafil. CT scan showed ILD that was concerning for UIP. She was d/c to SNF.   She presented back to the ED with recurrent respiratory distress. She had labored breathing with RR at 35. She was placed on BiPAP. Pt given 0.25mg  ativan, 10mg  albuterol, 125mg  soul-medrol .5mg  atrovent, Lasix 40 mg IV x 1. CXR shows central vascular congestion. Worsening interstitial prominence bilaterally. Findings highly suspicious for pulmonary edema or bilateral pneumonitis. Procalcitonin not significantly elevated.    This morning, she is now on venti mask with oxygen saturation 91%.  She did not diurese very well yesterday.  Creatinine stable.  HR/BP stable.     Urine is positive for Legionella antigen.  Scheduled Meds: . antiseptic oral rinse  7 mL Mouth Rinse q12n4p  . atorvastatin  10 mg  Oral q1800  . chlorhexidine  15 mL Mouth Rinse BID  . escitalopram  10 mg Oral Daily  . furosemide  80 mg Intravenous BID  . insulin aspart  0-9 Units Subcutaneous TID WC  . levofloxacin (LEVAQUIN) IV  750 mg Intravenous Q24H  . magnesium sulfate 1 - 4 g bolus IVPB  2 g Intravenous Once  . methylPREDNISolone (SOLU-MEDROL) injection  40 mg Intravenous Q6H  . pantoprazole (PROTONIX) IV  40 mg Intravenous QHS  . potassium chloride  40 mEq Oral Once  . Tadalafil (PAH)  20 mg Oral Daily  . Warfarin - Pharmacist Dosing Inpatient   Does not apply q1800   Continuous Infusions:  PRN Meds:.sodium chloride, albuterol   Filed Vitals:   03/27/15 0440 03/27/15 0500 03/27/15 0600 03/27/15 0700  BP: 122/51 107/50 101/55 118/50  Pulse: 90 77 55 59  Temp: 97.8 F (36.6 C)     TempSrc: Axillary     Resp: 27 32 27 28  Height:      Weight:      SpO2: 90%       Intake/Output Summary (Last 24 hours) at 03/27/15 0735 Last data filed at 03/27/15 0700  Gross per 24 hour  Intake    130 ml  Output    675 ml  Net   -545 ml    LABS: Basic Metabolic Panel:  Recent Labs  11/91/47 1829 03/26/15 0232 03/27/15 0224  NA  --  129* 132*  K  --  3.5 3.8  CL  --  98* 100*  CO2  --  24 27  GLUCOSE  --  171* 130*  BUN  --  8 15  CREATININE  --  0.55 0.56  CALCIUM  --  7.7* 7.8*  MG 1.1* 1.8  --   PHOS 2.8 3.8  --    Liver Function Tests:  Recent Labs  03/26/15 0232 03/27/15 0224  AST  --  79*  ALT  --  79*  ALKPHOS  --  116  BILITOT  --  0.8  PROT  --  5.8*  ALBUMIN 2.2* 2.1*   No results for input(s): LIPASE, AMYLASE in the last 72 hours. CBC:  Recent Labs  03/26/15 0232 03/27/15 0224  WBC 8.2 13.1*  NEUTROABS 7.5 12.0*  HGB 11.7* 11.9*  HCT 34.3* 35.1*  MCV 81.7 84.2  PLT 380 435*   Cardiac Enzymes:  Recent Labs  03/25/15 1649  TROPONINI 0.12*   BNP: Invalid input(s): POCBNP D-Dimer: No results for input(s): DDIMER in the last 72 hours. Hemoglobin A1C: No  results for input(s): HGBA1C in the last 72 hours. Fasting Lipid Panel: No results for input(s): CHOL, HDL, LDLCALC, TRIG, CHOLHDL, LDLDIRECT in the last 72 hours. Thyroid Function Tests: No results for input(s): TSH, T4TOTAL, T3FREE, THYROIDAB in the last 72 hours.  Invalid input(s): FREET3 Anemia Panel: No results for input(s): VITAMINB12, FOLATE, FERRITIN, TIBC, IRON, RETICCTPCT in the last 72 hours.  RADIOLOGY: Dg Chest 2 View  02/28/2015   CLINICAL DATA:  Shortness of breath and lower extremity pitting edema ; history of atrial fibrillation and asthma  EXAM: CHEST  2 VIEW  COMPARISON:  PA and lateral chest of Jan 17, 2015  FINDINGS: The lungs are mildly hyperinflated. The interstitial markings are increased. The pulmonary vascularity is engorged. The cardiac silhouette is mildly enlarged. A permanent pacemaker is in place with appropriate positioning of the electrodes. There is no pleural effusion. The bony thorax exhibits no acute abnormality.  IMPRESSION: Reactive airway disease with superimposed CHF and mild interstitial edema. There is no alveolar pneumonia.   Electronically Signed   By: David  [place] M.D.   On: 02/28/2015 14:17   Dg Wrist Complete Right  02/28/2015   CLINICAL DATA:  Wrist pain.  No known injury.  Initial evaluation.  EXAM: RIGHT WRIST - COMPLETE 3+ VIEW  COMPARISON:  None.  FINDINGS: Diffuse osteopenia and degenerative change. No acute bony or joint abnormality identified. No evidence of fracture or dislocation.  IMPRESSION: Negative.   Electronically Signed   By: Maisie Fus  Register   On: 02/28/2015 14:18   Ct Chest High Resolution  03/06/2015   CLINICAL DATA:  80 year old female with increasing shortness of breath over the past year. Clinical concern for interstitial lung disease. History of asthma. Currently on home oxygen.  EXAM: CT CHEST WITHOUT CONTRAST  TECHNIQUE: Multidetector CT imaging of the chest was performed following the standard protocol without intravenous  contrast. High resolution imaging of the lungs, as well as inspiratory and expiratory imaging, was performed.  COMPARISON:  Chest CT 11/13/2013.  FINDINGS: Mediastinum/Lymph Nodes: Heart size is mildly enlarged. There is no significant pericardial fluid, thickening or pericardial calcification. There is atherosclerosis of the thoracic aorta, the great vessels of the mediastinum and the coronary arteries, including calcified atherosclerotic plaque in the left main, left anterior descending, left circumflex and right coronary arteries. Mild calcifications of the aortic valve. Left-sided pacemaker device in position with lead tips terminating in the right atrium and the right ventricle near the apex. Multiple prominent borderline enlarged mediastinal and hilar lymph nodes are noted. Esophagus is unremarkable in appearance. No axillary lymphadenopathy.  Lungs/Pleura:  High-resolution images demonstrate extensive patchy areas of ground-glass attenuation asymmetrically distributed in the lungs bilaterally (left greater than right), typically in a peribronchovascular distribution. This is associated with extensive thickening of the adjacent peribronchovascular interstitium, as well as septal thickening and some subpleural reticulation. In addition, there is a more peripherally located area of consolidation and airspace disease in the left upper lobe which is somewhat wedge-shaped. Scattered areas of mild cylindrical bronchiectasis are noted throughout these same regions. No frank honeycombing is confidently identified at this time. No clearly definable craniocaudal gradient, although there is more basilar involvement at this time. No large suspicious appearing pulmonary nodules or masses. Inspiratory and expiratory imaging demonstrates some mild air trapping, indicative of mild small airways disease. No pleural effusions.  Upper Abdomen: Mild diffuse decreased attenuation throughout the hepatic parenchyma, indicative of  mild hepatic steatosis. Atherosclerosis.  Musculoskeletal/Soft Tissues: There are no aggressive appearing lytic or blastic lesions noted in the visualized portions of the skeleton.  IMPRESSION: 1. Unusual appearance of the lung parenchyma, as discussed above. These findings could certainly reflect an underlying interstitial lung disease, but the pattern is nonspecific. At this time, findings are favored to reflect nonspecific interstitial pneumonia (NSIP), or potentially cryptogenic organizing pneumonia (COP). No definite evidence to suggest more aggressive process such as usual interstitial pneumonia (UIP) at this time. 2. The possibility of concurrent or residual infectious airspace consolidation is not excluded, particularly in the periphery of the left upper lobe. Clinical correlation for signs and symptoms of pneumonia is recommended. 3. For further evaluation of these findings, a repeat high-resolution chest CT is suggested in 6-12 months to assess for temporal changes in the appearance of the lung parenchyma if clinically appropriate. 4. Atherosclerosis, including left main and 3 vessel coronary artery disease. Assessment for potential risk factor modification, dietary therapy or pharmacologic therapy may be warranted, if clinically indicated. 5. Mild hepatic steatosis. 6. Additional incidental findings, as above.   Electronically Signed   By: Trudie Reed M.D.   On: 03/06/2015 14:11   US Venous Img Lower Unilateral Left  02/28/2015   CLINICAL DATA:  Left lower extremity swelling for 2 months.  EXAM: LEFT LOWER EXTREMITY VENOUS DOPPLER ULTRASOUND  TECHNIQUE: Gray-scale sonography with graded compression, as well as color Doppler and duplex ultrasound were performed to evaluate the lower extremity deep venous systems from the level of the common femoral vein and including the common femoral, femoral, profunda femoral, popliteal and calf veins including the posterior tibial, peroneal and gastrocnemius  veins when visible. The superficial great saphenous vein was also interrogated. Spectral Doppler was utilized to evaluate flow at rest and with distal augmentation maneuvers in the common femoral, femoral and popliteal veins.  COMPARISON:  None.  FINDINGS: Contralateral Common Femoral Vein: Respiratory phasicity is normal and symmetric with the symptomatic side. No evidence of thrombus. Normal compressibility.  Common Femoral Vein: No evidence of thrombus. Normal compressibility, respiratory phasicity and response to augmentation.  Saphenofemoral Junction: No evidence of thrombus. Normal compressibility and flow on color Doppler imaging.  Profunda Femoral Vein: No evidence of thrombus. Normal compressibility and flow on color Doppler imaging.  Femoral Vein: No evidence of thrombus. Normal compressibility, respiratory phasicity and response to augmentation.  Popliteal Vein: No evidence of thrombus. Normal compressibility, respiratory phasicity and response to augmentation.  Calf Veins: No evidence of thrombus. Normal compressibility and flow on color Doppler imaging.  Superficial Great Saphenous Vein: No evidence of thrombus. Normal compressibility and flow on color Doppler imaging.  Venous Reflux:  None.  Other Findings:  None.  IMPRESSION: No evidence of deep venous thrombosis.   Electronically Signed   By: Myles Rosenthal M.D.   On: 02/28/2015 17:11   Dg Chest Port 1 View  03/27/2015   CLINICAL DATA:  Pulmonary edema.  EXAM: PORTABLE CHEST - 1 VIEW  COMPARISON:  03/25/2015.  FINDINGS: Mediastinum hilar structures normal. Cardiac pacer noted in stable position. Persistent bilateral unchanged airspace disease consistent with bilateral pulmonary edema and/or pneumonia. Small left pleural effusion. No pneumothorax.  IMPRESSION: 1. Persistent bilateral diffuse airspace disease consistent with pulmonary edema and/or pneumonia. Small left pleural effusion. 2. Stable cardiac pacer position.Heart size stable.    Electronically Signed   By: Maisie Fus  Register   On: 03/27/2015 06:56   Dg Chest Portable 1 View  03/25/2015   CLINICAL DATA:  Sudden onset of respiratory distress  EXAM: PORTABLE CHEST - 1 VIEW  COMPARISON:  02/28/2015  FINDINGS: Cardiomediastinal silhouette is stable. There is central mild vascular congestion. Worsening interstitial prominence bilaterally. Findings highly suspicious for pulmonary edema or bilateral pneumonitis. Clinical correlation is necessary. Stable dual lead cardiac pacemaker position.  IMPRESSION: There is central mild vascular congestion. Worsening interstitial prominence bilaterally. Findings highly suspicious for pulmonary edema or bilateral pneumonitis. Clinical correlation is necessary.   Electronically Signed   By: Natasha Mead M.D.   On: 03/25/2015 16:13    PHYSICAL EXAM General: NAD, on Bipap Neck: JVP 10 cm, no thyromegaly or thyroid nodule.  Lungs: Dependent crackles CV: Nondisplaced PMI.  Heart irregular S1/S2, no S3/S4, no murmur.  No peripheral edema.   Abdomen: Soft, nontender, no hepatosplenomegaly, no distention.  Neurologic: Alert and oriented x 3.  Psych: Normal affect. Extremities: No clubbing or cyanosis.   TELEMETRY: Reviewed telemetry pt in atrial fibrillation, controlled rate  ASSESSMENT AND PLAN: 80 yo with history of permanent atrial fibrillation, sinus node dysfunction s/p MDT PPM, Takotsubo CMP (resolved, 9/07), idiopathic eosinophilia, bronchiectasis with mild obstructive PFTs, CAD with moderate LAD and RCA disease by cath in 2012, diastolic CHF and PAH was admitted with recurrent acute hypoxemic respiratory failure. She has been slowly worsening over the last few months. She has been on 4 L home oxygen.  1. Acute on chronic hypoxemic respiratory failure: Recurrent, now on Bipap. CXR concerning for pulmonary edema versus PNA.  PCT not particularly but Legionella positive in urine.  - Getting IV Solumedrol for possible flare of interstitial lung  disease. - Continue diuresis.  - Covering with antibiotics => levofloxacin should cover the Legionella.   2. ID: Concern for Legionella PNA based on positive urine Legionella antigen.  Covering with levofloxacin.  She came from a nursing home. 3. Acute on chronic diastolic CHF: EF 07-61% on 5/16 echo with PASP 66 mmHg.Filling pressures low on recent RHC after diuresis at last admission, now appears to have some volume overload.  She was on Lasix 20 mg daily at home.  Suspect she has a narrow therapeutic window.  - Diuresis not vigorous yesterday, creatinine stable.  Increase Lasix to 80 mg IV bid.  4. Pulmonary hypertension: Significant PAH with marginal cardiac output (albeit in setting of hypovolemia) on recent RHC, possibly group 1 related to rheumatological disease. RF was positive and ANA weakly positive (with negative anti-dsDNA). She has a history of bronchiectasis with mild obstructive PFTs. High resolution CT chest last admission showed interstitial lung disease (?UIP pattern, ?ILD related to rheumatological disease). V/Q scan in 5/16 did not show acute or chronic PE. She was on Tadalafil  at last admission.  - Needs rheum workup: ?RA => will send CCP antibody (re-order, does not appear to have every been sent last admission).  - She continues on Tadalafil.   Based on most recent trial data (AMBITION), would like to get her on ambrisentan soon as well.  6. CAD: Moderate CAD on coronary angiography on 7/13. Continue atorvastatin. Not on ASA as she has been on warfarin.  6. Atrial fibrillation: Permanent it appears at this point. HR not elevated, not on rate control meds. Warfarin resumed. 7. PPM: MDT, h/o SN dysfunction.  8. H/o Takotsubo CMP: resolved.   Marca Ancona 03/27/2015 7:35 AM

## 2015-03-27 NOTE — Progress Notes (Signed)
eLink Physician-Brief Progress Note Patient Name: [name] DOB: 1934-10-07 MRN: [id number]   Date of Service  03/27/2015  HPI/Events of Note    eICU Interventions  PRN trazodone for sleep     Intervention Category Minor Interventions: Routine modifications to care plan (e.g. PRN medications for pain, fever)  Billy Fischer 03/27/2015, 11:42 PM

## 2015-03-27 NOTE — Progress Notes (Signed)
NP called me to eval pt d/t pt desat earlier.  Pt on VM, sat 93-94%.  Pt states her breathing feels better now.  Pt states she does not want to go on bipap at this time, pt wishes to continue with VM.  No distress currently noted.  Pt and family aware that she can request bipap if she feels she needs it.

## 2015-03-28 ENCOUNTER — Inpatient Hospital Stay (HOSPITAL_COMMUNITY): Payer: Commercial Managed Care - HMO

## 2015-03-28 LAB — URINE CULTURE
Culture: >=100000
Special Requests: NORMAL

## 2015-03-28 LAB — CBC
HCT: 36.8 % (ref 36.0–46.0)
Hemoglobin: 12.2 g/dL (ref 12.0–15.0)
MCH: 27.8 pg (ref 26.0–34.0)
MCHC: 33.2 g/dL (ref 30.0–36.0)
MCV: 83.8 fL (ref 78.0–100.0)
PLATELETS: 485 10*3/uL — AB (ref 150–400)
RBC: 4.39 MIL/uL (ref 3.87–5.11)
RDW: 15.7 % — ABNORMAL HIGH (ref 11.5–15.5)
WBC: 13.3 10*3/uL — AB (ref 4.0–10.5)

## 2015-03-28 LAB — PROTIME-INR
INR: 3.11 — AB (ref 0.00–1.49)
Prothrombin Time: 31.4 seconds — ABNORMAL HIGH (ref 11.6–15.2)

## 2015-03-28 LAB — BASIC METABOLIC PANEL
ANION GAP: 6 (ref 5–15)
BUN: 17 mg/dL (ref 6–20)
CALCIUM: 7.7 mg/dL — AB (ref 8.9–10.3)
CHLORIDE: 96 mmol/L — AB (ref 101–111)
CO2: 31 mmol/L (ref 22–32)
CREATININE: 0.59 mg/dL (ref 0.44–1.00)
GFR calc Af Amer: >60 mL/min (ref >60)
GFR calc non Af Amer: >60 mL/min (ref >60)
GLUCOSE: 130 mg/dL — AB (ref 65–99)
Potassium: 3.5 mmol/L (ref 3.5–5.1)
Sodium: 133 mmol/L — ABNORMAL LOW (ref 135–145)

## 2015-03-28 LAB — GLUCOSE, CAPILLARY
GLUCOSE-CAPILLARY: 188 mg/dL — AB (ref 65–99)
Glucose-Capillary: 114 mg/dL — ABNORMAL HIGH (ref 65–99)
Glucose-Capillary: 168 mg/dL — ABNORMAL HIGH (ref 65–99)
Glucose-Capillary: 149 mg/dL — ABNORMAL HIGH (ref 65–99)

## 2015-03-28 MED ORDER — PANTOPRAZOLE SODIUM 40 MG PO TBEC
40.0000 mg | DELAYED_RELEASE_TABLET | Freq: Every day | ORAL | Status: DC
  Administered 2015-03-28 – 2015-04-02 (×6): 40 mg via ORAL
  Filled 2015-03-28 (×6): qty 1

## 2015-03-28 MED ORDER — POTASSIUM CHLORIDE CRYS ER 20 MEQ PO TBCR
40.0000 meq | EXTENDED_RELEASE_TABLET | Freq: Once | ORAL | Status: AC
  Administered 2015-03-28: 40 meq via ORAL
  Filled 2015-03-28: qty 2

## 2015-03-28 NOTE — Care Management Important Message (Signed)
Important Message  Patient Details  Name: [name] MRN: [id number] Date of Birth: 1934/11/17   Medicare Important Message Given:  Yes-second notification given    Yvonna Alanis 03/28/2015, 11:17 AM

## 2015-03-28 NOTE — Progress Notes (Signed)
PULMONARY / CRITICAL CARE MEDICINE   Name: [name] MRN: [id number] DOB: 08/08/1935    ADMISSION DATE:  03/25/2015 CONSULTATION DATE:  03/25/15  REFERRING MD :  ED  CHIEF COMPLAINT:  Acute on Chronic Hypoxic Respiratory Failure  INITIAL PRESENTATION: 80 year old female with known history of pulmonary hypertension started on tadalafil during last hospitalization with only mild nausea. Patient presents with subjective symptoms suggestive of an infectious etiology to her SIRS as well as acute on chronic hypoxic respiratory failure with physical exam findings of overt fluid overload.   STUDIES: PORTABLE CXR (03/25/15): Increased prominence of bilateral interstitial markings. No focal consolidation appreciated.  V/Q Scan (01/17/15): Patchy deposition of radiopharmaceutical in both lungs on ventilation. No wedge-shaped perfusion defects to suggest acute pulmonary embolism. Findings consistent with low probability for acute pulmonary embolism.  HRCT Chest (03/06/15): Findings suggestive of an SI pain bilaterally.  SIGNIFICANT EVENTS: 7/16 - Discharged from hospital 8/2 - BiPAP initiated 8/2 - Admitted  SUBJECTIVE:  Patient reports dyspnea does seem to be improving slightly. Did have some abdominal discomfort this morning but is eating more substantial diet. Continuing on Ventimask. Denies any chest pain or pressure. No emesis.  VITAL SIGNS: Temp:  [97 F (36.1 C)-98.3 F (36.8 C)] 98.3 F (36.8 C) (08/05 1607) Pulse Rate:  [55-69] 66 (08/05 1800) Resp:  [16-33] 19 (08/05 1800) BP: (109-128)/(59-79) 112/79 mmHg (08/05 1800) SpO2:  [91 %-97 %] 91 % (08/05 1607) Weight:  [127 lb 8 oz (57.834 kg)] 127 lb 8 oz (57.834 kg) (08/05 0500) HEMODYNAMICS:   VENTILATOR SETTINGS:   INTAKE / OUTPUT:  Intake/Output Summary (Last 24 hours) at 03/28/15 1938 Last data filed at 03/28/15 1800  Gross per 24 hour  Intake    440 ml  Output   2150 ml  Net  -1710 ml    PHYSICAL  EXAMINATION: General: Sitting in bed with husband at bedside. Watching TV. No distress. Neuro: Following commands. Grossly nonfocal. Oriented to person, place, and time. HEENT: No oral ulcers.Premarin. No scleral icterus. PULM: Bilateral basal crackles unchanged. Patient still having some mild respiratory distress. CV:  No edema. Regular rate. Unable to appreciate JVD. GI: Soft. Nontender. Nondistended. Integument: Warm and dry. No rash on exposed skin.    LABS:  CBC  Recent Labs Lab 03/26/15 0232 03/27/15 0224 03/28/15 0215  WBC 8.2 13.1* 13.3*  HGB 11.7* 11.9* 12.2  HCT 34.3* 35.1* 36.8  PLT 380 435* 485*   Coag's  Recent Labs Lab 03/26/15 0232 03/27/15 0224 03/28/15 0215  INR 1.86* 2.15* 3.11*   BMET  Recent Labs Lab 03/26/15 0232 03/27/15 0224 03/28/15 0215  NA 129* 132* 133*  K 3.5 3.8 3.5  CL 98* 100* 96*  CO2 BUN CREATININE 0.55 0.56 0.59  GLUCOSE 171* 130* 130*   Electrolytes  Recent Labs Lab 03/25/15 1829 03/26/15 0232 03/27/15 0224 03/28/15 0215  CALCIUM  --  7.7* 7.8* 7.7*  MG 1.1* 1.8  --   --   PHOS 2.8 3.8  --   --    Sepsis Markers  Recent Labs Lab 03/25/15 1829 03/26/15 0232 03/27/15 0224  PROCALCITON 0.14 0.27 0.15   ABG  Recent Labs Lab 03/25/15 1641  PHART 7.432  PCO2ART 30.9*  PO2ART 149.0*   Liver Enzymes  Recent Labs Lab 03/26/15 0232 03/27/15 0224  AST  --  79*  ALT  --  79*  ALKPHOS  --  116  BILITOT  --  0.8  ALBUMIN 2.2* 2.1*   Cardiac Enzymes  Recent Labs Lab 03/25/15 1649  TROPONINI 0.12*   Glucose  Recent Labs Lab 03/27/15 1208 03/27/15 1738 03/27/15 2232 03/28/15 0746 03/28/15 1157 03/28/15 1605  GLUCAP 157* 124* 112* 114* 168* 149*    Imaging Dg Chest Port 1 View  03/28/2015   CLINICAL DATA:  Respiratory failure.  EXAM: PORTABLE CHEST - 1 VIEW  COMPARISON:  03/27/2015.  FINDINGS: Mediastinum hilar structures normal. Cardiac pacer with lead tips in right  atrium and right ventricle. Cardiomegaly. Interim partial clearing of bilateral airspace disease. Small left pleural effusion. No pneumothorax.  IMPRESSION: Interim partial clearing of bilateral airspace disease .   Electronically Signed   By: Maisie Fus  Register   On: 03/28/2015 07:20     ASSESSMENT / PLAN:  PULMONARY BiPAP 8/2 >> A: Acute on chronic hypoxic respiratory failure  Pulmonary hypertension  Legionella PNA  P:   Continuing BiPAP PRN  Patient is DO NOT INTUBATE Tadalafil 20 mg by mouth daily Continue solumedrol  Albuterol neb prn Empiric levaquin as below   CARDIOVASCULAR A: H/O Atrial fibrillation Pulmonary hypertension Pulmonary edema   P:  Coumadin per pharmacy c/s Monitor on telemetry tadalafil Continue Lasix diuresis Cardiology following  RENAL A:  Urinary retention Hyponatremia - mild   P:   Continue Foley catheter Monitor electrolytes daily Monitor urine output Creatinine renal function daily BUN/creatinine   GASTROINTESTINAL A:   No acute issues H/O Peptic Ulcer disease  P:   Full liquid diet Protonix IV daily  HEMATOLOGIC A:   Leukocytosis - improving Chronic Coumadin  P:  Cont home Coumadin per pharmacy c/s Daily CBC/INR  INFECTIOUS A:   Sepsis - UTI versus pneumonia (HCAP) Possible Legionella PNA -  Questionably true pathogen.  From SNF P:   Procalcitonin algorithm- unimpressive Trending leukocytosis Plan for antibiotic courses 7 days  BCx2 8/2 >> UC 8/2 >> Sputum Culture ?8/2>> Legionella antigen >> POS   Abx:  Vancomycin 8/2>>>8/3 Fortaz, start date 8/2>>>8/3 Levaquin 8/3>>>   ENDOCRINE A:   Steroid therapy  P:   Monitor glucose on daily labs SSI    NEUROLOGIC A:   No acute issues  P:   Monitor for mental status changes Trazodone for sleep/insomnia   FAMILY  - Updates:  Husband updated at bedside this morning.  - Inter-disciplinary family meet or Palliative Care meeting due by:  8/9  TODAY'S  SUMMARY: Patient continues to tolerate diuresis. Respiratory status is improving. Continuing empiric treatment for Legionella pneumonia with low clinical suspicion but positive urinary legionella antigen test.   Donna Christen. Jamison Neighbor, M.D. [facility] Pulmonary & Critical [facility] Pager:  [phone] After 3pm or if no response, call [phone]  03/28/2015  7:38 PM

## 2015-03-28 NOTE — Progress Notes (Signed)
ANTIBIOTIC and ANTICOAGULATION CONSULT NOTE  Pharmacy Consult for Levaquin and warfarin Indication: CAP, legionella positive; AFib  Allergies  Allergen Reactions  . Benzoyl Peroxide Swelling    Swelling at the site of use  . Neomycin-Bacitracin Zn-Polymyx Swelling    Swelling at the site of use    Patient Measurements: Height: 5\' 4"  (162.6 cm) Weight: 127 lb 8 oz (57.834 kg) IBW/kg (Calculated) : 54.7   Vital Signs: Temp: 97.7 F (36.5 C) (08/05 0747) Temp Source: Axillary (08/05 0747) BP: 128/67 mmHg (08/05 0747) Pulse Rate: 57 (08/05 0747) Intake/Output from previous day: 08/04 0701 - 08/05 0700 In: 500 [P.O.:120; I.V.:180; IV Piggyback:200] Out: 2450 [Urine:2450] Intake/Output from this shift: Total I/O In: 250 [P.O.:240; I.V.:10] Out: -   Labs:  Recent Labs  03/26/15 0232 03/27/15 0224 03/28/15 0215  WBC 8.2 13.1* 13.3*  HGB 11.7* 11.9* 12.2  PLT 380 435* 485*  CREATININE 0.55 0.56 0.59   Estimated Creatinine Clearance: 50.9 mL/min (by C-G formula based on Cr of 0.59). No results for input(s): VANCOTROUGH, VANCOPEAK, VANCORANDOM, GENTTROUGH, GENTPEAK, GENTRANDOM, TOBRATROUGH, TOBRAPEAK, TOBRARND, AMIKACINPEAK, AMIKACINTROU, AMIKACIN in the last 72 hours.   Microbiology: Recent Results (from the past 720 hour(s))  MRSA PCR Screening     Status: None   Collection Time: 02/28/15 11:22 PM  Result Value Ref Range Status   MRSA by PCR NEGATIVE NEGATIVE Final    Comment:        The GeneXpert MRSA Assay (FDA approved for NASAL specimens only), is one component of a comprehensive MRSA colonization surveillance program. It is not intended to diagnose MRSA infection nor to guide or monitor treatment for MRSA infections.   Culture, Urine     Status: None (Preliminary result)   Collection Time: 03/25/15  6:12 PM  Result Value Ref Range Status   Specimen Description URINE, CATHETERIZED  Final   Special Requests Normal  Final   Culture >=100,000 COLONIES/mL  PSEUDOMONAS AERUGINOSA  Final   Report Status PENDING  Incomplete  MRSA PCR Screening     Status: None   Collection Time: 03/25/15  8:04 PM  Result Value Ref Range Status   MRSA by PCR NEGATIVE NEGATIVE Final    Comment:        The GeneXpert MRSA Assay (FDA approved for NASAL specimens only), is one component of a comprehensive MRSA colonization surveillance program. It is not intended to diagnose MRSA infection nor to guide or monitor treatment for MRSA infections.   Culture, blood (routine x 2)     Status: None (Preliminary result)   Collection Time: 03/25/15  8:40 PM  Result Value Ref Range Status   Specimen Description BLOOD RIGHT ANTECUBITAL  Final   Special Requests BOTTLES DRAWN AEROBIC ONLY 6CC  Final   Culture NO GROWTH 2 DAYS  Final   Report Status PENDING  Incomplete  Culture, blood (routine x 2)     Status: None (Preliminary result)   Collection Time: 03/25/15  8:45 PM  Result Value Ref Range Status   Specimen Description BLOOD LEFT HAND  Final   Special Requests BOTTLES DRAWN AEROBIC ONLY 5CC  Final   Culture NO GROWTH 2 DAYS  Final   Report Status PENDING  Incomplete    Assessment: 80yo female with history of Afib, eosinophilia, GERD and cardiomyopathy presents with respiratory distress. Patient is positive for legionella antigen, antibiotics were narrowed to Levaquin by CCM. SCr stable at 0.59, CrCl ~50-7mL/min.  No fevers noted. WBC increased at 13.3, but patient is  on steroids. procalcitonin normalized. Blood cultures NGTD. Urine culture with >100K psuedomonas aeruginosa- awaiting sensitivites.  Patient also continues on warfarin for history of AFib. INR dramatically increased from 2.15 yesterday to 3.11 this morning. This is likely d/t interaction with levofloxacin. Hgb with WNL, plts elevated. No bleeding noted.  Goal of Therapy:  Resolution of infection INR 2-3  Plan:  -Levaquin  IV q24 hours- will wait to change to PO when steroids are not IV  anymore -f/u culture results for sensitivities, renal function, clinical course, and LOT -hold warfarin tonight -daily INR -follow for s/s bleeding  Shanautica Forker D. Ambrose Wile, PharmD, BCPS Clinical Pharmacist Pager: [phone] 03/28/2015 9:53 AM

## 2015-03-28 NOTE — Progress Notes (Signed)
Patient ID: [name], female   DOB: 16-Feb-1935, 80 y.o.   MRN: [id number]   80 y.o. female with a past medical history significant for permanent atrial fibrillation with slow ventricular response, s/p dual chamber pacemaker programmed VVIR, moderate CAD by recent cath 03/05/15, bronchietasis, moderate obstructive lung disease, moderate PAH, remote Takotsubo with recovered LV function, chronic diastolic CHF, EF 62-13% on echo 12/2014  She has been followed by Dr. Delton Coombes, over the last year she has developed worsening dyspnea and hypoxemia, requiring O2 4L/min at rest. She has had abnormal rheumatological studies (RA and ANA), no evidence of pulmonary embolism or sleep apnea.   She was recently admitted 7/8-7/16 for Acute on chronic respiratory failure: multifactorial due toacute on chronic diastolic heart failure and pulmonary hypertension. After diuresis, R/LHC showed pulmonary arterial hypertension with high PVR and low cardiac index (2.1 by Fick and 1.8 by thermodilution). Filling pressures were low. It was suspected group 1 PAH related to rheumatological disease. LHC showed moderate CAD. She was treated with lasix and tadafil. CT scan showed ILD that was concerning for UIP. She was d/c to SNF.   She presented back to the ED with recurrent respiratory distress. She had labored breathing with RR at 35. She was placed on BiPAP. Pt given 0.25mg  ativan, 10mg  albuterol, 125mg  soul-medrol .5mg  atrovent, Lasix 40 mg IV x 1. CXR shows central vascular congestion. Worsening interstitial prominence bilaterally. Findings highly suspicious for pulmonary edema or bilateral pneumonitis. Procalcitonin not significantly elevated.    This morning, she remains on venti mask but O2 sats better.  She diuresed well yesterday, feels like breathing is better.  Creatinine stable.  HR/BP stable.     Urine was positive for Legionella antigen, on levofloxacin.  Scheduled Meds: . antiseptic oral rinse  7 mL Mouth Rinse  q12n4p  . atorvastatin  10 mg Oral q1800  . chlorhexidine  15 mL Mouth Rinse BID  . escitalopram  10 mg Oral Daily  . furosemide  80 mg Intravenous BID  . insulin aspart  0-9 Units Subcutaneous TID WC  . levofloxacin (LEVAQUIN) IV  750 mg Intravenous Q24H  . magnesium sulfate 1 - 4 g bolus IVPB  2 g Intravenous Once  . methylPREDNISolone (SOLU-MEDROL) injection  40 mg Intravenous Q6H  . pantoprazole (PROTONIX) IV  40 mg Intravenous QHS  . potassium chloride  40 mEq Oral Once  . Tadalafil (PAH)  20 mg Oral Daily  . Warfarin - Pharmacist Dosing Inpatient   Does not apply q1800   Continuous Infusions:  PRN Meds:.sodium chloride, albuterol, traZODone   Filed Vitals:   03/28/15 0400 03/28/15 0500 03/28/15 0600 03/28/15 0747  BP: 109/66  126/64 128/67  Pulse: 55  58 57  Temp: 97.4 F (36.3 C)   97.7 F (36.5 C)  TempSrc: Axillary   Axillary  Resp: 22  16 24   Height:      Weight:  127 lb 8 oz (57.834 kg)    SpO2:    97%    Intake/Output Summary (Last 24 hours) at 03/28/15 0839 Last data filed at 03/28/15 0800  Gross per 24 hour  Intake    500 ml  Output   2250 ml  Net  -1750 ml    LABS: Basic Metabolic Panel:  Recent Labs  08/65/78 1829 03/26/15 0232 03/27/15 0224 03/28/15 0215  NA  --  129* 132* 133*  K  --  3.5 3.8 3.5  CL  --  98* 100* 96*  CO2  --  24 27 31   GLUCOSE  --  171* 130* 130*  BUN  --  8 15 17   CREATININE  --  0.55 0.56 0.59  CALCIUM  --  7.7* 7.8* 7.7*  MG 1.1* 1.8  --   --   PHOS 2.8 3.8  --   --    Liver Function Tests:  Recent Labs  03/26/15 0232 03/27/15 0224  AST  --  79*  ALT  --  79*  ALKPHOS  --  116  BILITOT  --  0.8  PROT  --  5.8*  ALBUMIN 2.2* 2.1*   No results for input(s): LIPASE, AMYLASE in the last 72 hours. CBC:  Recent Labs  03/26/15 0232 03/27/15 0224 03/28/15 0215  WBC 8.2 13.1* 13.3*  NEUTROABS 7.5 12.0*  --   HGB 11.7* 11.9* 12.2  HCT 34.3* 35.1* 36.8  MCV 81.7 84.2 83.8  PLT 380 435* 485*   Cardiac  Enzymes:  Recent Labs  03/25/15 1649  TROPONINI 0.12*   BNP: Invalid input(s): POCBNP D-Dimer: No results for input(s): DDIMER in the last 72 hours. Hemoglobin A1C: No results for input(s): HGBA1C in the last 72 hours. Fasting Lipid Panel: No results for input(s): CHOL, HDL, LDLCALC, TRIG, CHOLHDL, LDLDIRECT in the last 72 hours. Thyroid Function Tests: No results for input(s): TSH, T4TOTAL, T3FREE, THYROIDAB in the last 72 hours.  Invalid input(s): FREET3 Anemia Panel: No results for input(s): VITAMINB12, FOLATE, FERRITIN, TIBC, IRON, RETICCTPCT in the last 72 hours.  RADIOLOGY: Dg Chest 2 View  02/28/2015   CLINICAL DATA:  Shortness of breath and lower extremity pitting edema ; history of atrial fibrillation and asthma  EXAM: CHEST  2 VIEW  COMPARISON:  PA and lateral chest of Jan 17, 2015  FINDINGS: The lungs are mildly hyperinflated. The interstitial markings are increased. The pulmonary vascularity is engorged. The cardiac silhouette is mildly enlarged. A permanent pacemaker is in place with appropriate positioning of the electrodes. There is no pleural effusion. The bony thorax exhibits no acute abnormality.  IMPRESSION: Reactive airway disease with superimposed CHF and mild interstitial edema. There is no alveolar pneumonia.   Electronically Signed   By: David  [place] M.D.   On: 02/28/2015 14:17   Dg Wrist Complete Right  02/28/2015   CLINICAL DATA:  Wrist pain.  No known injury.  Initial evaluation.  EXAM: RIGHT WRIST - COMPLETE 3+ VIEW  COMPARISON:  None.  FINDINGS: Diffuse osteopenia and degenerative change. No acute bony or joint abnormality identified. No evidence of fracture or dislocation.  IMPRESSION: Negative.   Electronically Signed   By: Maisie Fus  Register   On: 02/28/2015 14:18   Ct Chest High Resolution  03/06/2015   CLINICAL DATA:  80 year old female with increasing shortness of breath over the past year. Clinical concern for interstitial lung disease. History of  asthma. Currently on home oxygen.  EXAM: CT CHEST WITHOUT CONTRAST  TECHNIQUE: Multidetector CT imaging of the chest was performed following the standard protocol without intravenous contrast. High resolution imaging of the lungs, as well as inspiratory and expiratory imaging, was performed.  COMPARISON:  Chest CT 11/13/2013.  FINDINGS: Mediastinum/Lymph Nodes: Heart size is mildly enlarged. There is no significant pericardial fluid, thickening or pericardial calcification. There is atherosclerosis of the thoracic aorta, the great vessels of the mediastinum and the coronary arteries, including calcified atherosclerotic plaque in the left main, left anterior descending, left circumflex and right coronary arteries. Mild calcifications of the aortic valve. Left-sided pacemaker device in position with  lead tips terminating in the right atrium and the right ventricle near the apex. Multiple prominent borderline enlarged mediastinal and hilar lymph nodes are noted. Esophagus is unremarkable in appearance. No axillary lymphadenopathy.  Lungs/Pleura: High-resolution images demonstrate extensive patchy areas of ground-glass attenuation asymmetrically distributed in the lungs bilaterally (left greater than right), typically in a peribronchovascular distribution. This is associated with extensive thickening of the adjacent peribronchovascular interstitium, as well as septal thickening and some subpleural reticulation. In addition, there is a more peripherally located area of consolidation and airspace disease in the left upper lobe which is somewhat wedge-shaped. Scattered areas of mild cylindrical bronchiectasis are noted throughout these same regions. No frank honeycombing is confidently identified at this time. No clearly definable craniocaudal gradient, although there is more basilar involvement at this time. No large suspicious appearing pulmonary nodules or masses. Inspiratory and expiratory imaging demonstrates some  mild air trapping, indicative of mild small airways disease. No pleural effusions.  Upper Abdomen: Mild diffuse decreased attenuation throughout the hepatic parenchyma, indicative of mild hepatic steatosis. Atherosclerosis.  Musculoskeletal/Soft Tissues: There are no aggressive appearing lytic or blastic lesions noted in the visualized portions of the skeleton.  IMPRESSION: 1. Unusual appearance of the lung parenchyma, as discussed above. These findings could certainly reflect an underlying interstitial lung disease, but the pattern is nonspecific. At this time, findings are favored to reflect nonspecific interstitial pneumonia (NSIP), or potentially cryptogenic organizing pneumonia (COP). No definite evidence to suggest more aggressive process such as usual interstitial pneumonia (UIP) at this time. 2. The possibility of concurrent or residual infectious airspace consolidation is not excluded, particularly in the periphery of the left upper lobe. Clinical correlation for signs and symptoms of pneumonia is recommended. 3. For further evaluation of these findings, a repeat high-resolution chest CT is suggested in 6-12 months to assess for temporal changes in the appearance of the lung parenchyma if clinically appropriate. 4. Atherosclerosis, including left main and 3 vessel coronary artery disease. Assessment for potential risk factor modification, dietary therapy or pharmacologic therapy may be warranted, if clinically indicated. 5. Mild hepatic steatosis. 6. Additional incidental findings, as above.   Electronically Signed   By: Trudie Reed M.D.   On: 03/06/2015 14:11   US Venous Img Lower Unilateral Left  02/28/2015   CLINICAL DATA:  Left lower extremity swelling for 2 months.  EXAM: LEFT LOWER EXTREMITY VENOUS DOPPLER ULTRASOUND  TECHNIQUE: Gray-scale sonography with graded compression, as well as color Doppler and duplex ultrasound were performed to evaluate the lower extremity deep venous systems from  the level of the common femoral vein and including the common femoral, femoral, profunda femoral, popliteal and calf veins including the posterior tibial, peroneal and gastrocnemius veins when visible. The superficial great saphenous vein was also interrogated. Spectral Doppler was utilized to evaluate flow at rest and with distal augmentation maneuvers in the common femoral, femoral and popliteal veins.  COMPARISON:  None.  FINDINGS: Contralateral Common Femoral Vein: Respiratory phasicity is normal and symmetric with the symptomatic side. No evidence of thrombus. Normal compressibility.  Common Femoral Vein: No evidence of thrombus. Normal compressibility, respiratory phasicity and response to augmentation.  Saphenofemoral Junction: No evidence of thrombus. Normal compressibility and flow on color Doppler imaging.  Profunda Femoral Vein: No evidence of thrombus. Normal compressibility and flow on color Doppler imaging.  Femoral Vein: No evidence of thrombus. Normal compressibility, respiratory phasicity and response to augmentation.  Popliteal Vein: No evidence of thrombus. Normal compressibility, respiratory phasicity and response to augmentation.  Calf Veins: No evidence of thrombus. Normal compressibility and flow on color Doppler imaging.  Superficial Great Saphenous Vein: No evidence of thrombus. Normal compressibility and flow on color Doppler imaging.  Venous Reflux:  None.  Other Findings:  None.  IMPRESSION: No evidence of deep venous thrombosis.   Electronically Signed   By: Myles Rosenthal M.D.   On: 02/28/2015 17:11   Dg Chest Port 1 View  03/28/2015   CLINICAL DATA:  Respiratory failure.  EXAM: PORTABLE CHEST - 1 VIEW  COMPARISON:  03/27/2015.  FINDINGS: Mediastinum hilar structures normal. Cardiac pacer with lead tips in right atrium and right ventricle. Cardiomegaly. Interim partial clearing of bilateral airspace disease. Small left pleural effusion. No pneumothorax.  IMPRESSION: Interim partial  clearing of bilateral airspace disease .   Electronically Signed   By: Maisie Fus  Register   On: 03/28/2015 07:20   Dg Chest Port 1 View  03/27/2015   CLINICAL DATA:  Pulmonary edema.  EXAM: PORTABLE CHEST - 1 VIEW  COMPARISON:  03/25/2015.  FINDINGS: Mediastinum hilar structures normal. Cardiac pacer noted in stable position. Persistent bilateral unchanged airspace disease consistent with bilateral pulmonary edema and/or pneumonia. Small left pleural effusion. No pneumothorax.  IMPRESSION: 1. Persistent bilateral diffuse airspace disease consistent with pulmonary edema and/or pneumonia. Small left pleural effusion. 2. Stable cardiac pacer position.Heart size stable.   Electronically Signed   By: Maisie Fus  Register   On: 03/27/2015 06:56   Dg Chest Portable 1 View  03/25/2015   CLINICAL DATA:  Sudden onset of respiratory distress  EXAM: PORTABLE CHEST - 1 VIEW  COMPARISON:  02/28/2015  FINDINGS: Cardiomediastinal silhouette is stable. There is central mild vascular congestion. Worsening interstitial prominence bilaterally. Findings highly suspicious for pulmonary edema or bilateral pneumonitis. Clinical correlation is necessary. Stable dual lead cardiac pacemaker position.  IMPRESSION: There is central mild vascular congestion. Worsening interstitial prominence bilaterally. Findings highly suspicious for pulmonary edema or bilateral pneumonitis. Clinical correlation is necessary.   Electronically Signed   By: Natasha Mead M.D.   On: 03/25/2015 16:13    PHYSICAL EXAM General: NAD, on Bipap Neck: JVP 10 cm, no thyromegaly or thyroid nodule.  Lungs: Dependent crackles CV: Nondisplaced PMI.  Heart irregular S1/S2, no S3/S4, no murmur.  No peripheral edema.   Abdomen: Soft, nontender, no hepatosplenomegaly, no distention.  Neurologic: Alert and oriented x 3.  Psych: Normal affect. Extremities: No clubbing or cyanosis.   TELEMETRY: Reviewed telemetry pt in atrial fibrillation, controlled rate  ASSESSMENT AND  PLAN: 80 yo with history of permanent atrial fibrillation, sinus node dysfunction s/p MDT PPM, Takotsubo CMP (resolved, 9/07), idiopathic eosinophilia, bronchiectasis with mild obstructive PFTs, CAD with moderate LAD and RCA disease by cath in 2012, diastolic CHF and PAH was admitted with recurrent acute hypoxemic respiratory failure. She has been slowly worsening over the last few months. She has been on 4 L home oxygen.  1. Acute on chronic hypoxemic respiratory failure: Recurrent. CXR concerning for pulmonary edema versus PNA, some clearing on today's CXR.  PCT not high but Legionella positive in urine.  - Getting IV Solumedrol for possible flare of interstitial lung disease. - Continue diuresis.  - Covering with antibiotics => levofloxacin should cover the Legionella.   2. ID: Concern for Legionella PNA based on positive urine Legionella antigen.  Covering with levofloxacin.  She came from a nursing home. 3. Acute on chronic diastolic CHF: EF 78-29% on 5/16 echo with PASP 66 mmHg.Filling pressures low on recent RHC after diuresis at  last admission, now appears to have some volume overload.  She was on Lasix 20 mg daily at home.  Suspect she has a narrow therapeutic window.  - Still volume overloaded, continue Lasix 80 mg IV bid today.  4. Pulmonary hypertension: Significant PAH with marginal cardiac output (albeit in setting of hypovolemia) on recent RHC, possibly group 1 related to rheumatological disease. RF was positive and ANA weakly positive (with negative anti-dsDNA). She has a history of bronchiectasis with mild obstructive PFTs. High resolution CT chest last admission showed interstitial lung disease (?UIP pattern, ?ILD related to rheumatological disease). V/Q scan in 5/16 did not show acute or chronic PE. She was on Tadalafil at last admission.  - Needs rheum workup: ?RA => will send CCP antibody (re-order, does not appear to have every been sent last admission). - She continues  on Tadalafil.   Based on most recent trial data (AMBITION), would like to get her on ambrisentan soon as well.  6. CAD: Moderate CAD on coronary angiography on 7/13. Continue atorvastatin. Not on ASA as she has been on warfarin.  6. Atrial fibrillation: Permanent it appears at this point. HR not elevated, not on rate control meds. Warfarin resumed. 7. PPM: MDT, h/o SN dysfunction.  8. H/o Takotsubo CMP: resolved.   Marca Ancona 03/28/2015 8:39 AM

## 2015-03-29 LAB — GLUCOSE, CAPILLARY
GLUCOSE-CAPILLARY: 114 mg/dL — AB (ref 65–99)
Glucose-Capillary: 118 mg/dL — ABNORMAL HIGH (ref 65–99)
Glucose-Capillary: 115 mg/dL — ABNORMAL HIGH (ref 65–99)
Glucose-Capillary: 186 mg/dL — ABNORMAL HIGH (ref 65–99)

## 2015-03-29 LAB — BASIC METABOLIC PANEL
Anion gap: 8 (ref 5–15)
BUN: 19 mg/dL (ref 6–20)
CO2: 30 mmol/L (ref 22–32)
Calcium: 7.9 mg/dL — ABNORMAL LOW (ref 8.9–10.3)
Chloride: 93 mmol/L — ABNORMAL LOW (ref 101–111)
Creatinine, Ser: 0.55 mg/dL (ref 0.44–1.00)
GFR calc Af Amer: >60 mL/min (ref >60)
GFR calc non Af Amer: >60 mL/min (ref >60)
GLUCOSE: 126 mg/dL — AB (ref 65–99)
POTASSIUM: 3.9 mmol/L (ref 3.5–5.1)
Sodium: 131 mmol/L — ABNORMAL LOW (ref 135–145)

## 2015-03-29 LAB — RENAL FUNCTION PANEL
Albumin: 2.2 g/dL — ABNORMAL LOW (ref 3.5–5.0)
Anion gap: 9 (ref 5–15)
BUN: 19 mg/dL (ref 6–20)
CALCIUM: 7.9 mg/dL — AB (ref 8.9–10.3)
CO2: 31 mmol/L (ref 22–32)
Chloride: 93 mmol/L — ABNORMAL LOW (ref 101–111)
Creatinine, Ser: 0.56 mg/dL (ref 0.44–1.00)
GFR calc non Af Amer: >60 mL/min (ref >60)
GLUCOSE: 129 mg/dL — AB (ref 65–99)
POTASSIUM: 4 mmol/L (ref 3.5–5.1)
Phosphorus: 2 mg/dL — ABNORMAL LOW (ref 2.5–4.6)
SODIUM: 133 mmol/L — AB (ref 135–145)

## 2015-03-29 LAB — PROTIME-INR
INR: 4.52 — AB (ref 0.00–1.49)
Prothrombin Time: 41.7 seconds — ABNORMAL HIGH (ref 11.6–15.2)

## 2015-03-29 NOTE — Progress Notes (Signed)
ANTICOAGULATION CONSULT NOTE - Follow Up Consult  Pharmacy Consult for warfarin Indication: atrial fibrillation  Allergies  Allergen Reactions  . Benzoyl Peroxide Swelling    Swelling at the site of use  . Neomycin-Bacitracin Zn-Polymyx Swelling    Swelling at the site of use    Patient Measurements: Height: 5\' 4"  (162.6 cm) Weight: 127 lb 1.6 oz (57.652 kg) IBW/kg (Calculated) : 54.7  Vital Signs: Temp: 97.6 F (36.4 C) (08/06 0815) Temp Source: Axillary (08/06 0815) BP: 122/62 mmHg (08/06 0815) Pulse Rate: 60 (08/06 0815)  Labs:  Recent Labs  03/27/15 0224 03/28/15 0215 03/29/15 0234  HGB 11.9* 12.2  --   HCT 35.1* 36.8  --   PLT 435* 485*  --   LABPROT 23.8* 31.4* 41.7*  INR 2.15* 3.11* 4.52*  CREATININE 0.56 0.59 0.55  0.56    Estimated Creatinine Clearance: 50.9 mL/min (by C-G formula based on Cr of 0.55).   Assessment: 80 yo f with h/o afib on warfarin PTA (home dose 2.5 mg Sun/Tues/Thurs, 1.25 mg all other days). INR has been increasing fast (2.15>3.11>4.52), likely due to drug interaction with Levaquin. INR this AM is 4.52. CBC stable, no bleeding or issues noted.  Goal of Therapy:  INR 2-3 Monitor platelets by anticoagulation protocol: Yes   Plan:  Hold warfarin again tonight F/u INR in AM Monitor hgb/plts, s/s of bleeding, clinical course  Cassie L. Roseanne Reno, PharmD Clinical Pharmacy Resident Pager: [phone] 03/29/2015 11:10 AM

## 2015-03-29 NOTE — Progress Notes (Signed)
Patient ID: [name], female   DOB: [birth year], 80 y.o.   MRN: [id number]   80 y.o. female with a past medical history significant for permanent atrial fibrillation with slow ventricular response, s/p dual chamber pacemaker programmed VVIR, moderate CAD by recent cath 03/05/15, bronchietasis, moderate obstructive lung disease, moderate PAH, remote Takotsubo with recovered LV function, chronic diastolic CHF, EF 78-29% on echo 12/2014  She has been followed by Dr. Delton Coombes, over the last year she has developed worsening dyspnea and hypoxemia, requiring O2 4L/min at rest. She has had abnormal rheumatological studies (RA and ANA), no evidence of pulmonary embolism or sleep apnea.   She was recently admitted 7/8-7/16 for Acute on chronic respiratory failure: multifactorial due toacute on chronic diastolic heart failure and pulmonary hypertension. After diuresis, R/LHC showed pulmonary arterial hypertension with high PVR and low cardiac index (2.1 by Fick and 1.8 by thermodilution). Filling pressures were low. It was suspected group 1 PAH related to rheumatological disease. LHC showed moderate CAD. She was treated with lasix and tadafil. CT scan showed ILD that was concerning for UIP. She was d/c to SNF.   She presented back to the ED with recurrent respiratory distress. She had labored breathing with RR at 35. She was placed on BiPAP. Pt given 0.25mg  ativan, 10mg  albuterol, 125mg  soul-medrol .5mg  atrovent, Lasix 40 mg IV x 1. CXR shows central vascular congestion. Worsening interstitial prominence bilaterally. Findings highly suspicious for pulmonary edema or bilateral pneumonitis. Procalcitonin not significantly elevated.    This morning she is doing better, now on nasal cannula.  She diuresed well yesterday, feels like breathing is better. Not reflected in weight (getting bed weights unfortunately). Creatinine stable.  HR/BP stable.     Urine was positive for Legionella antigen, on  levofloxacin.  Scheduled Meds: . antiseptic oral rinse  7 mL Mouth Rinse q12n4p  . atorvastatin  10 mg Oral q1800  . chlorhexidine  15 mL Mouth Rinse BID  . escitalopram  10 mg Oral Daily  . furosemide  80 mg Intravenous BID  . insulin aspart  0-9 Units Subcutaneous TID WC  . levofloxacin (LEVAQUIN) IV  750 mg Intravenous Q24H  . magnesium sulfate 1 - 4 g bolus IVPB  2 g Intravenous Once  . methylPREDNISolone (SOLU-MEDROL) injection  40 mg Intravenous Q6H  . pantoprazole  40 mg Oral QHS  . Tadalafil (PAH)  20 mg Oral Daily  . Warfarin - Pharmacist Dosing Inpatient   Does not apply q1800   Continuous Infusions:  PRN Meds:.sodium chloride, albuterol, traZODone   Filed Vitals:   03/29/15 0409 03/29/15 0815 03/29/15 0909 03/29/15 1252  BP:  122/62  103/59  Pulse:  60  61  Temp:  97.6 F (36.4 C)  98 F (36.7 C)  TempSrc:  Axillary  Axillary  Resp:  17  19  Height:      Weight: 127 lb 1.6 oz (57.652 kg)     SpO2:  96% 94% 93%    Intake/Output Summary (Last 24 hours) at 03/29/15 1331 Last data filed at 03/29/15 0200  Gross per 24 hour  Intake    120 ml  Output   1550 ml  Net  -1430 ml    LABS: Basic Metabolic Panel:  Recent Labs  56/21/30 0215 03/29/15 0234  NA 133* 131*  133*  K 3.5 3.9  4.0  CL 96* 93*  93*  CO2 31 30  31   GLUCOSE 130* 126*  129*  BUN 17 19  19  CREATININE 0.59 0.55  0.56  CALCIUM 7.7* 7.9*  7.9*  PHOS  --  2.0*   Liver Function Tests:  Recent Labs  03/27/15 0224 03/29/15 0234  AST 79*  --   ALT 79*  --   ALKPHOS 116  --   BILITOT 0.8  --   PROT 5.8*  --   ALBUMIN 2.1* 2.2*   No results for input(s): LIPASE, AMYLASE in the last 72 hours. CBC:  Recent Labs  03/27/15 0224 03/28/15 0215  WBC 13.1* 13.3*  NEUTROABS 12.0*  --   HGB 11.9* 12.2  HCT 35.1* 36.8  MCV 84.2 83.8  PLT 435* 485*   Cardiac Enzymes: No results for input(s): CKTOTAL, CKMB, CKMBINDEX, TROPONINI in the last 72 hours. BNP: Invalid input(s):  POCBNP D-Dimer: No results for input(s): DDIMER in the last 72 hours. Hemoglobin A1C: No results for input(s): HGBA1C in the last 72 hours. Fasting Lipid Panel: No results for input(s): CHOL, HDL, LDLCALC, TRIG, CHOLHDL, LDLDIRECT in the last 72 hours. Thyroid Function Tests: No results for input(s): TSH, T4TOTAL, T3FREE, THYROIDAB in the last 72 hours.  Invalid input(s): FREET3 Anemia Panel: No results for input(s): VITAMINB12, FOLATE, FERRITIN, TIBC, IRON, RETICCTPCT in the last 72 hours.  RADIOLOGY: Dg Chest 2 View  02/28/2015   CLINICAL DATA:  Shortness of breath and lower extremity pitting edema ; history of atrial fibrillation and asthma  EXAM: CHEST  2 VIEW  COMPARISON:  PA and lateral chest of Jan 17, 2015  FINDINGS: The lungs are mildly hyperinflated. The interstitial markings are increased. The pulmonary vascularity is engorged. The cardiac silhouette is mildly enlarged. A permanent pacemaker is in place with appropriate positioning of the electrodes. There is no pleural effusion. The bony thorax exhibits no acute abnormality.  IMPRESSION: Reactive airway disease with superimposed CHF and mild interstitial edema. There is no alveolar pneumonia.   Electronically Signed   By: David  [place] M.D.   On: 02/28/2015 14:17   Dg Wrist Complete Right  02/28/2015   CLINICAL DATA:  Wrist pain.  No known injury.  Initial evaluation.  EXAM: RIGHT WRIST - COMPLETE 3+ VIEW  COMPARISON:  None.  FINDINGS: Diffuse osteopenia and degenerative change. No acute bony or joint abnormality identified. No evidence of fracture or dislocation.  IMPRESSION: Negative.   Electronically Signed   By: Maisie Fus  Register   On: 02/28/2015 14:18   Ct Chest High Resolution  03/06/2015   CLINICAL DATA:  80 year old female with increasing shortness of breath over the past year. Clinical concern for interstitial lung disease. History of asthma. Currently on home oxygen.  EXAM: CT CHEST WITHOUT CONTRAST  TECHNIQUE: Multidetector  CT imaging of the chest was performed following the standard protocol without intravenous contrast. High resolution imaging of the lungs, as well as inspiratory and expiratory imaging, was performed.  COMPARISON:  Chest CT 11/13/2013.  FINDINGS: Mediastinum/Lymph Nodes: Heart size is mildly enlarged. There is no significant pericardial fluid, thickening or pericardial calcification. There is atherosclerosis of the thoracic aorta, the great vessels of the mediastinum and the coronary arteries, including calcified atherosclerotic plaque in the left main, left anterior descending, left circumflex and right coronary arteries. Mild calcifications of the aortic valve. Left-sided pacemaker device in position with lead tips terminating in the right atrium and the right ventricle near the apex. Multiple prominent borderline enlarged mediastinal and hilar lymph nodes are noted. Esophagus is unremarkable in appearance. No axillary lymphadenopathy.  Lungs/Pleura: High-resolution images demonstrate extensive patchy areas of ground-glass attenuation  asymmetrically distributed in the lungs bilaterally (left greater than right), typically in a peribronchovascular distribution. This is associated with extensive thickening of the adjacent peribronchovascular interstitium, as well as septal thickening and some subpleural reticulation. In addition, there is a more peripherally located area of consolidation and airspace disease in the left upper lobe which is somewhat wedge-shaped. Scattered areas of mild cylindrical bronchiectasis are noted throughout these same regions. No frank honeycombing is confidently identified at this time. No clearly definable craniocaudal gradient, although there is more basilar involvement at this time. No large suspicious appearing pulmonary nodules or masses. Inspiratory and expiratory imaging demonstrates some mild air trapping, indicative of mild small airways disease. No pleural effusions.  Upper  Abdomen: Mild diffuse decreased attenuation throughout the hepatic parenchyma, indicative of mild hepatic steatosis. Atherosclerosis.  Musculoskeletal/Soft Tissues: There are no aggressive appearing lytic or blastic lesions noted in the visualized portions of the skeleton.  IMPRESSION: 1. Unusual appearance of the lung parenchyma, as discussed above. These findings could certainly reflect an underlying interstitial lung disease, but the pattern is nonspecific. At this time, findings are favored to reflect nonspecific interstitial pneumonia (NSIP), or potentially cryptogenic organizing pneumonia (COP). No definite evidence to suggest more aggressive process such as usual interstitial pneumonia (UIP) at this time. 2. The possibility of concurrent or residual infectious airspace consolidation is not excluded, particularly in the periphery of the left upper lobe. Clinical correlation for signs and symptoms of pneumonia is recommended. 3. For further evaluation of these findings, a repeat high-resolution chest CT is suggested in 6-12 months to assess for temporal changes in the appearance of the lung parenchyma if clinically appropriate. 4. Atherosclerosis, including left main and 3 vessel coronary artery disease. Assessment for potential risk factor modification, dietary therapy or pharmacologic therapy may be warranted, if clinically indicated. 5. Mild hepatic steatosis. 6. Additional incidental findings, as above.   Electronically Signed   By: Trudie Reed M.D.   On: 03/06/2015 14:11   US Venous Img Lower Unilateral Left  02/28/2015   CLINICAL DATA:  Left lower extremity swelling for 2 months.  EXAM: LEFT LOWER EXTREMITY VENOUS DOPPLER ULTRASOUND  TECHNIQUE: Gray-scale sonography with graded compression, as well as color Doppler and duplex ultrasound were performed to evaluate the lower extremity deep venous systems from the level of the common femoral vein and including the common femoral, femoral, profunda  femoral, popliteal and calf veins including the posterior tibial, peroneal and gastrocnemius veins when visible. The superficial great saphenous vein was also interrogated. Spectral Doppler was utilized to evaluate flow at rest and with distal augmentation maneuvers in the common femoral, femoral and popliteal veins.  COMPARISON:  None.  FINDINGS: Contralateral Common Femoral Vein: Respiratory phasicity is normal and symmetric with the symptomatic side. No evidence of thrombus. Normal compressibility.  Common Femoral Vein: No evidence of thrombus. Normal compressibility, respiratory phasicity and response to augmentation.  Saphenofemoral Junction: No evidence of thrombus. Normal compressibility and flow on color Doppler imaging.  Profunda Femoral Vein: No evidence of thrombus. Normal compressibility and flow on color Doppler imaging.  Femoral Vein: No evidence of thrombus. Normal compressibility, respiratory phasicity and response to augmentation.  Popliteal Vein: No evidence of thrombus. Normal compressibility, respiratory phasicity and response to augmentation.  Calf Veins: No evidence of thrombus. Normal compressibility and flow on color Doppler imaging.  Superficial Great Saphenous Vein: No evidence of thrombus. Normal compressibility and flow on color Doppler imaging.  Venous Reflux:  None.  Other Findings:  None.  IMPRESSION:  No evidence of deep venous thrombosis.   Electronically Signed   By: Myles Rosenthal M.D.   On: 02/28/2015 17:11   Dg Chest Port 1 View  03/28/2015   CLINICAL DATA:  Respiratory failure.  EXAM: PORTABLE CHEST - 1 VIEW  COMPARISON:  03/27/2015.  FINDINGS: Mediastinum hilar structures normal. Cardiac pacer with lead tips in right atrium and right ventricle. Cardiomegaly. Interim partial clearing of bilateral airspace disease. Small left pleural effusion. No pneumothorax.  IMPRESSION: Interim partial clearing of bilateral airspace disease .   Electronically Signed   By: Maisie Fus  Register   On:  03/28/2015 07:20   Dg Chest Port 1 View  03/27/2015   CLINICAL DATA:  Pulmonary edema.  EXAM: PORTABLE CHEST - 1 VIEW  COMPARISON:  03/25/2015.  FINDINGS: Mediastinum hilar structures normal. Cardiac pacer noted in stable position. Persistent bilateral unchanged airspace disease consistent with bilateral pulmonary edema and/or pneumonia. Small left pleural effusion. No pneumothorax.  IMPRESSION: 1. Persistent bilateral diffuse airspace disease consistent with pulmonary edema and/or pneumonia. Small left pleural effusion. 2. Stable cardiac pacer position.Heart size stable.   Electronically Signed   By: Maisie Fus  Register   On: 03/27/2015 06:56   Dg Chest Portable 1 View  03/25/2015   CLINICAL DATA:  Sudden onset of respiratory distress  EXAM: PORTABLE CHEST - 1 VIEW  COMPARISON:  02/28/2015  FINDINGS: Cardiomediastinal silhouette is stable. There is central mild vascular congestion. Worsening interstitial prominence bilaterally. Findings highly suspicious for pulmonary edema or bilateral pneumonitis. Clinical correlation is necessary. Stable dual lead cardiac pacemaker position.  IMPRESSION: There is central mild vascular congestion. Worsening interstitial prominence bilaterally. Findings highly suspicious for pulmonary edema or bilateral pneumonitis. Clinical correlation is necessary.   Electronically Signed   By: Natasha Mead M.D.   On: 03/25/2015 16:13    PHYSICAL EXAM General: NAD, on Bipap Neck: JVP 10 cm, no thyromegaly or thyroid nodule.  Lungs: Dependent crackles CV: Nondisplaced PMI.  Heart irregular S1/S2, no S3/S4, no murmur.  No peripheral edema.   Abdomen: Soft, nontender, no hepatosplenomegaly, no distention.  Neurologic: Alert and oriented x 3.  Psych: Normal affect. Extremities: No clubbing or cyanosis.   TELEMETRY: Reviewed telemetry pt in atrial fibrillation, controlled rate  ASSESSMENT AND PLAN: 80 yo with history of permanent atrial fibrillation, sinus node dysfunction s/p MDT PPM,  Takotsubo CMP (resolved, 9/07), idiopathic eosinophilia, bronchiectasis with mild obstructive PFTs, CAD with moderate LAD and RCA disease by cath in 2012, diastolic CHF and PAH was admitted with recurrent acute hypoxemic respiratory failure. She has been slowly worsening over the last few months. She has been on 4 L home oxygen.  1. Acute on chronic hypoxemic respiratory failure: Recurrent. CXR concerning for pulmonary edema versus PNA, some clearing on today's CXR.  PCT not high but Legionella positive in urine.  - Getting IV Solumedrol for possible flare of interstitial lung disease. - Continue diuresis.  - Covering with antibiotics => levofloxacin should cover the Legionella.   2. ID: Concern for Legionella PNA based on positive urine Legionella antigen.  Covering with levofloxacin.  She came from a nursing home. 3. Acute on chronic diastolic CHF: EF 32-44% on 5/16 echo with PASP 66 mmHg.Filling pressures low on recent RHC after diuresis at last admission, now appears to have some volume overload.  She was on Lasix 20 mg daily at home.  Suspect she has a narrow therapeutic window.  - Still volume overloaded today, continue Lasix 80 mg IV bid.  Has had good  response so far.  4. Pulmonary hypertension: Significant PAH with marginal cardiac output (albeit in setting of hypovolemia) on recent RHC, possibly group 1 related to rheumatological disease. RF was positive and ANA weakly positive (with negative anti-dsDNA). She has a history of bronchiectasis with mild obstructive PFTs. High resolution CT chest last admission showed interstitial lung disease (?UIP pattern, ?ILD related to rheumatological disease). V/Q scan in 5/16 did not show acute or chronic PE. She was on Tadalafil at last admission.  - Needs rheum workup: ?RA => will send CCP antibody (re-order, does not appear to have every been sent last admission). - She continues on Tadalafil.   Based on most recent trial data (AMBITION), would  like to get her on ambrisentan soon as well.  6. CAD: Moderate CAD on coronary angiography on 7/13. Continue atorvastatin. Not on ASA as she has been on warfarin.  6. Atrial fibrillation: Permanent it appears at this point. HR not elevated, not on rate control meds. Warfarin resumed. 7. PPM: MDT, h/o SN dysfunction.  8. H/o Takotsubo CMP: resolved.  9. PT/OT  Marca Ancona 03/29/2015 1:31 PM

## 2015-03-29 NOTE — Progress Notes (Signed)
Patient is not requiring BIPAP at this time. Patient is Sat 98% on 4L O2. RT will continue to monitor as needed.

## 2015-03-29 NOTE — Progress Notes (Signed)
PULMONARY / CRITICAL CARE MEDICINE   Name: [name] MRN: [id number] DOB: 08/26/1934    ADMISSION DATE:  03/25/2015 CONSULTATION DATE:  03/25/15  REFERRING MD :  ED  CHIEF COMPLAINT:  Acute on Chronic Hypoxic Respiratory Failure  INITIAL PRESENTATION: 80 year old female with known history of pulmonary hypertension started on tadalafil during last hospitalization with only mild nausea. Patient presents with subjective symptoms suggestive of an infectious etiology to her SIRS as well as acute on chronic hypoxic respiratory failure with physical exam findings of overt fluid overload.   STUDIES: PORTABLE CXR (03/25/15): Increased prominence of bilateral interstitial markings. No focal consolidation appreciated.  V/Q Scan (01/17/15): Patchy deposition of radiopharmaceutical in both lungs on ventilation. No wedge-shaped perfusion defects to suggest acute pulmonary embolism. Findings consistent with low probability for acute pulmonary embolism.  HRCT Chest (03/06/15): Findings suggestive of an SI pain bilaterally.  SIGNIFICANT EVENTS: 7/16 - Discharged from hospital 8/2 - BiPAP initiated 8/2 - Admitted  SUBJECTIVE:  No events overnight. Denies any chest pain or pressure. Diuresing with Lasix. ANA & rheumatoid factor positive. Anti-CCP ordered by cardiology. Dyspnea mildly improved and currently on nasal cannula oxygen.   ROS:  No nausea or vomiting. No bowel movement. No subjective fever, chills, or sweats.   VITAL SIGNS: Temp:  [97.4 F (36.3 C)-98.3 F (36.8 C)] 97.4 F (36.3 C) (08/06 1635) Pulse Rate:  [59-69] 66 (08/06 1635) Resp:  [17-35] 27 (08/06 1635) BP: (92-122)/(56-79) 111/61 mmHg (08/06 1635) SpO2:  [90 %-97 %] 97 % (08/06 1635) Weight:  [127 lb 1.6 oz (57.652 kg)] 127 lb 1.6 oz (57.652 kg) (08/06 0409) HEMODYNAMICS:   VENTILATOR SETTINGS:   INTAKE / OUTPUT:  Intake/Output Summary (Last 24 hours) at 03/29/15 1756 Last data filed at 03/29/15 0200  Gross per 24 hour   Intake     70 ml  Output    800 ml  Net   -730 ml    PHYSICAL EXAMINATION: General: Sitting in bed with husband at bedside. Watching TV. No distress. Neuro: Following commands. Nonfocal. Oriented to person, place, and time. HEENT: No oral ulcers. No scleral icterus. PULM: Bilateral basal crackles unchanged. Weaned to [place] with no change in WOB. CV:  No edema. Regular rate.  GI: Soft. Nontender. Nondistended. Normal BS. Integument: Warm and dry. No rash on exposed skin.    LABS:  CBC  Recent Labs Lab 03/26/15 0232 03/27/15 0224 03/28/15 0215  WBC 8.2 13.1* 13.3*  HGB 11.7* 11.9* 12.2  HCT 34.3* 35.1* 36.8  PLT 380 435* 485*   Coag's  Recent Labs Lab 03/27/15 0224 03/28/15 0215 03/29/15 0234  INR 2.15* 3.11* 4.52*   BMET  Recent Labs Lab 03/27/15 0224 03/28/15 0215 03/29/15 0234  NA 132* 133* 131*  133*  K 3.8 3.5 3.9  4.0  CL 100* 96* 93*  93*  CO2 27 31 30  31   BUN 15 17 19  19   CREATININE 0.56 0.59 0.55  0.56  GLUCOSE 130* 130* 126*  129*   Electrolytes  Recent Labs Lab 03/25/15 1829 03/26/15 0232 03/27/15 0224 03/28/15 0215 03/29/15 0234  CALCIUM  --  7.7* 7.8* 7.7* 7.9*  7.9*  MG 1.1* 1.8  --   --   --   PHOS 2.8 3.8  --   --  2.0*   Sepsis Markers  Recent Labs Lab 03/25/15 1829 03/26/15 0232 03/27/15 0224  PROCALCITON 0.14 0.27 0.15   ABG  Recent Labs Lab 03/25/15 1641  PHART 7.432  PCO2ART 30.9*  PO2ART 149.0*   Liver Enzymes  Recent Labs Lab 03/26/15 0232 03/27/15 0224 03/29/15 0234  AST  --  79*  --   ALT  --  79*  --   ALKPHOS  --  116  --   BILITOT  --  0.8  --   ALBUMIN 2.2* 2.1* 2.2*   Cardiac Enzymes  Recent Labs Lab 03/25/15 1649  TROPONINI 0.12*   Glucose  Recent Labs Lab 03/28/15 1157 03/28/15 1605 03/28/15 2031 03/29/15 0813 03/29/15 1250 03/29/15 1631  GLUCAP 168* 149* 188* 118* 114* 115*    Imaging No results found.   ASSESSMENT / PLAN:  PULMONARY BiPAP 8/2  >> A: Acute on chronic hypoxic respiratory failure - Pulm edema vs ILD flare vs Pna Pulmonary hypertension  Legionella PNA ILD  P:   Continuing BiPAP PRN  Patient is DO NOT INTUBATE Tadalafil 20 mg by mouth daily Continue solumedrol  Albuterol neb prn Empiric levaquin as below  Anti-CCP pending  CARDIOVASCULAR A: H/O Atrial fibrillation Pulmonary hypertension Pulmonary edema   P:  Coumadin per pharmacy c/s - currently on hold Monitor on telemetry Tadalafil Continue Lasix diuresis bid Cardiology following  RENAL A:  Urinary retention Hyponatremia - mild   P:   Continue Foley catheter Monitor electrolytes daily Monitor urine output Creatinine renal function daily BUN/creatinine   GASTROINTESTINAL A:   No acute issues H/O Peptic Ulcer disease  P:   Full liquid diet Protonix PO daily  HEMATOLOGIC A:   Leukocytosis - improving Chronic Coumadin Coagulopathy - secondary to Coumadin & Levaquin  P:  Cont home Coumadin per pharmacy c/s Daily CBC/INR Holding Coumadin  INFECTIOUS A:   Sepsis - UTI versus pneumonia (HCAP) Possible Legionella PNA -  Questionably true pathogen.  From SNF  P:   Procalcitonin algorithm- unimpressive Trending leukocytosis Plan for antibiotic course of 7 days  BCx2 8/2 >> UC 8/2 >> Sputum Culture ?8/2>> Legionella antigen >> POS   Abx:  Vancomycin 8/2>>>8/3 Fortaz, start date 8/2>>>8/3 Levaquin 8/3>>>   ENDOCRINE A:   Steroid therapy  P:   Accuchecks tid AC SSI    NEUROLOGIC A:   No acute issues  P:   Monitor for mental status changes Trazodone for sleep/insomnia   FAMILY  - Updates:  Husband updated at bedside this afternoon.  - Inter-disciplinary family meet or Palliative Care meeting due by:  8/9  TODAY'S SUMMARY: Tolerating diuresis. Possible RA manifestation with acute ILD flare. Awaiting Anti-CCP. Weaning oxygen successfully. Needs to have BM. Tolerating diet.   Donna Christen Jamison Neighbor,  M.D. [facility] Pulmonary & Critical Care Pager:  [phone] After 3pm or if no response, call [phone]  03/29/2015  5:56 PM

## 2015-03-30 ENCOUNTER — Inpatient Hospital Stay (HOSPITAL_COMMUNITY): Payer: Commercial Managed Care - HMO

## 2015-03-30 DIAGNOSIS — B965 Pseudomonas (aeruginosa) (mallei) (pseudomallei) as the cause of diseases classified elsewhere: Secondary | ICD-10-CM

## 2015-03-30 DIAGNOSIS — N390 Urinary tract infection, site not specified: Secondary | ICD-10-CM

## 2015-03-30 DIAGNOSIS — A481 Legionnaires' disease: Secondary | ICD-10-CM

## 2015-03-30 LAB — BASIC METABOLIC PANEL
Anion gap: 9 (ref 5–15)
BUN: 19 mg/dL (ref 6–20)
CHLORIDE: 91 mmol/L — AB (ref 101–111)
CO2: 32 mmol/L (ref 22–32)
Calcium: 8.1 mg/dL — ABNORMAL LOW (ref 8.9–10.3)
Creatinine, Ser: 0.62 mg/dL (ref 0.44–1.00)
GLUCOSE: 135 mg/dL — AB (ref 65–99)
POTASSIUM: 3.9 mmol/L (ref 3.5–5.1)
Sodium: 132 mmol/L — ABNORMAL LOW (ref 135–145)

## 2015-03-30 LAB — GLUCOSE, CAPILLARY
Glucose-Capillary: 136 mg/dL — ABNORMAL HIGH (ref 65–99)
Glucose-Capillary: 163 mg/dL — ABNORMAL HIGH (ref 65–99)
Glucose-Capillary: 151 mg/dL — ABNORMAL HIGH (ref 65–99)

## 2015-03-30 LAB — CULTURE, BLOOD (ROUTINE X 2)
CULTURE: NO GROWTH
CULTURE: NO GROWTH

## 2015-03-30 LAB — PROTIME-INR
INR: 3.38 — ABNORMAL HIGH (ref 0.00–1.49)
Prothrombin Time: 33.4 seconds — ABNORMAL HIGH (ref 11.6–15.2)

## 2015-03-30 MED ORDER — PREDNISONE 20 MG PO TABS
40.0000 mg | ORAL_TABLET | Freq: Every day | ORAL | Status: DC
  Administered 2015-03-30 – 2015-03-31 (×2): 40 mg via ORAL
  Filled 2015-03-30 (×3): qty 2

## 2015-03-30 MED ORDER — TIOTROPIUM BROMIDE MONOHYDRATE 18 MCG IN CAPS
18.0000 ug | ORAL_CAPSULE | Freq: Every day | RESPIRATORY_TRACT | Status: DC
  Administered 2015-03-30 – 2015-04-02 (×4): 18 ug via RESPIRATORY_TRACT
  Filled 2015-03-30: qty 5

## 2015-03-30 MED ORDER — POLYETHYLENE GLYCOL 3350 17 G PO PACK
17.0000 g | PACK | Freq: Every day | ORAL | Status: DC
  Administered 2015-03-30 – 2015-04-02 (×4): 17 g via ORAL
  Filled 2015-03-30 (×5): qty 1

## 2015-03-30 MED ORDER — ALBUTEROL SULFATE (2.5 MG/3ML) 0.083% IN NEBU: 2.5000 mg | INHALATION_SOLUTION | RESPIRATORY_TRACT | Status: DC | PRN

## 2015-03-30 MED ORDER — LEVOFLOXACIN 750 MG PO TABS
750.0000 mg | ORAL_TABLET | Freq: Every day | ORAL | Status: DC
  Administered 2015-03-30 – 2015-04-02 (×4): 750 mg via ORAL
  Filled 2015-03-30 (×5): qty 1

## 2015-03-30 MED ORDER — BUDESONIDE 0.5 MG/2ML IN SUSP
0.5000 mg | Freq: Two times a day (BID) | RESPIRATORY_TRACT | Status: DC
  Administered 2015-03-30 – 2015-04-02 (×7): 0.5 mg via RESPIRATORY_TRACT
  Filled 2015-03-30 (×12): qty 2

## 2015-03-30 MED ORDER — SENNA 8.6 MG PO TABS
1.0000 | ORAL_TABLET | Freq: Every day | ORAL | Status: DC
  Administered 2015-03-30 – 2015-04-02 (×4): 8.6 mg via ORAL
  Filled 2015-03-30 (×4): qty 1

## 2015-03-30 MED ORDER — BISACODYL 5 MG PO TBEC
5.0000 mg | DELAYED_RELEASE_TABLET | Freq: Once | ORAL | Status: AC
  Administered 2015-03-30: 5 mg via ORAL
  Filled 2015-03-30: qty 1

## 2015-03-30 NOTE — Progress Notes (Signed)
Patient heart rythm converted to afib. MD Nesto updated. Text paged cardiology on call. Awaiting call back. Called 3E to give a report. Unit request to call back to obain the report.

## 2015-03-30 NOTE — Evaluation (Signed)
Physical Therapy Evaluation Patient Details Name: [name] MRN: [id number] DOB: 11/11/1934 Today's Date: 03/30/2015   History of Present Illness  Patient admitted from SNF with subjective symptoms suggestive of an infectious etiology to her SIRS as well as acute on chronic hypoxic respiratory failure; abnormal rheumatological studies (RA and ANA)  Past Medical History  Diagnosis Date  . Asthma   . Rhinitis   . Osteoarthritis   . Osteoporosis   . Peptic ulcer disease   . Depression   . Atrial fibrillation     s/p ablation   . Takotsubo cardiomyopathy     resolved  . Shingles   . Eosinophilia   . Sinoatrial node dysfunction   . GERD (gastroesophageal reflux disease)   . Bronchiectasis   . Peptic ulcer disease   . Osteoarthritis   . Herpes zoster   . Hyperlipemia   . Injury of splenic artery     infarct  . Pulmonary hypertension    Past Surgical History  Procedure Laterality Date  . Cholecystectomy    . Tonsillectomy    . Cataract extraction    . Pacemaker insertion  09/29/06    MDT ADDR01 dual chamber pacemaker implanted for SSS - programmed VVIR 2/2 permanent atrial fibrillation  . Cosmetic surgery    . Cardiac catheterization N/A 03/05/2015    Procedure: Right/Left Heart Cath and Coronary Angiography;  Surgeon: Laurey Morale, MD;  Location: [facility] INVASIVE CV LAB;  Service: Cardiovascular;  Laterality: N/A;     Clinical Impression  Pt admitted with above diagnosis. Pt currently with functional limitations due to the deficits listed below (see PT Problem List).  Pt will benefit from skilled PT to increase their independence and safety with mobility to allow discharge to the venue listed below.       Follow Up Recommendations SNF;Supervision/Assistance - 24 hour   Mr. And Mrs. Horstmeyer have made it clear that they would rather dc home; Still, given her current functional state, I favor a SNF stay for continued rehab to maximize independence and safety with mobility prior to  dc home -- It is highly likely they will refuse SNF;  For dc home, I recommend: HHPT/OT/Aide/RN, and possible SW Ambulance transport home Hospital bed    Equipment Recommendations  Hospital bed;Other (comment) (Ambulance transport home)    Recommendations for Other Services OT consult     Precautions / Restrictions Precautions Precautions: Fall Precaution Comments: watch sats      Mobility  Bed Mobility Overal bed mobility: Needs Assistance Bed Mobility: Supine to Sit;Sit to Supine     Supine to sit: Mod assist Sit to supine: Mod assist   General bed mobility comments: Cues for technqiue; mod assist to elevate trunk ot sitting; Poor endurance  Transfers Overall transfer level: Needs assistance Equipment used: Rolling walker (2 wheeled) Transfers: Sit to/from Stand Sit to Stand: Mod assist;+2 safety/equipment         General transfer comment: Light mod assist to power up; poor endurance, and decr functional capacity, with O2 sats dropping to 83% with this minimal activity  Ambulation/Gait Ambulation/Gait assistance: Mod assist;+2 safety/equipment Ambulation Distance (Feet):  (sidesteps towards HOB) Assistive device: Rolling walker (2 wheeled) Gait Pattern/deviations: Shuffle     General Gait Details: Poor endurance, desatted to 83%, so we sat back down to the bed  Stairs            Wheelchair Mobility    Modified Rankin (Stroke Patients Only)  Balance             Standing balance-Leahy Scale: Poor                               Pertinent Vitals/Pain Pain Assessment: No/denies pain    Home Living Family/patient expects to be discharged to:: Private residence Living Arrangements: Spouse/significant other Available Help at Discharge: Family;Available 24 hours/day Type of Home: House Home Access: Stairs to enter Entrance Stairs-Rails: Right Entrance Stairs-Number of Steps: 4 Home Layout: Two level;Bed/bath upstairs;1/2  bath on main level Home Equipment: Wheelchair - manual      Prior Function Level of Independence: Needs assistance   Gait / Transfers Assistance Needed: pt held onto husband to walk PTA  ADL's / Homemaking Assistance Needed: sponge bathes at shower is on second floor, husband assists with LEs and back, sets up washcloth  Comments: As of PT note on 7/11: Pt states she sleeps on couch and uses 1/2 bath on lower level     Hand Dominance   Dominant Hand: Right    Extremity/Trunk Assessment   Upper Extremity Assessment: Defer to OT evaluation           Lower Extremity Assessment: Generalized weakness      Cervical / Trunk Assessment: Kyphotic  Communication   Communication: No difficulties  Cognition Arousal/Alertness: Awake/alert Behavior During Therapy: WFL for tasks assessed/performed Overall Cognitive Status: Within Functional Limits for tasks assessed       Memory: Decreased short-term memory              General Comments General comments (skin integrity, edema, etc.): Having desatted significantly with minimal activity on 6 L via [place], we opted to get back in the bed; RN called, and she initiated Venturi mask, which pt's O2 sats responded favorably    Exercises        Assessment/Plan    PT Assessment Patient needs continued PT services  PT Diagnosis Generalized weakness;Other (comment) (Decreased functional capacity)   PT Problem List Decreased activity tolerance;Decreased balance;Decreased mobility;Decreased knowledge of use of DME;Decreased safety awareness;Decreased knowledge of precautions;Decreased strength  PT Treatment Interventions DME instruction;Gait training;Functional mobility training;Therapeutic activities;Therapeutic exercise;Balance training;Patient/family education   PT Goals (Current goals can be found in the Care Plan section) Acute Rehab PT Goals Patient Stated Goal: to go home PT Goal Formulation: With patient/family Time For Goal  Achievement: 04/13/15 Potential to Achieve Goals: Good    Frequency Min 3X/week   Barriers to discharge        Co-evaluation               End of Session Equipment Utilized During Treatment: Gait belt;Oxygen Activity Tolerance: Patient limited by fatigue Patient left: in bed;with call bell/phone within reach;with bed alarm set;with family/visitor present Nurse Communication: Mobility status         Time: 1100-1125 PT Time Calculation (min) (ACUTE ONLY): 25 min   Charges:   PT Evaluation $Initial PT Evaluation Tier I: 1 Procedure PT Treatments $Therapeutic Activity: 8-22 mins   PT G Codes:        Olen Pel 03/30/2015, 1:54 PM  Van Clines,   Acute Rehabilitation Services Pager [phone] Office [phone]

## 2015-03-30 NOTE — Progress Notes (Signed)
ANTICOAGULATION CONSULT NOTE - Follow Up Consult  Pharmacy Consult for warfarin Indication: atrial fibrillation  Allergies  Allergen Reactions  . Benzoyl Peroxide Swelling    Swelling at the site of use  . Neomycin-Bacitracin Zn-Polymyx Swelling    Swelling at the site of use    Patient Measurements: Height: 5\' 4"  (162.6 cm) Weight: 125 lb (56.7 kg) IBW/kg (Calculated) : 54.7  Vital Signs: Temp: 97.5 F (36.4 C) (08/07 0727) Temp Source: Rectal (08/07 0727) BP: 123/69 mmHg (08/07 0727) Pulse Rate: 60 (08/07 0400)  Labs:  Recent Labs  03/28/15 0215 03/29/15 0234 03/30/15 0250  HGB 12.2  --   --   HCT 36.8  --   --   PLT 485*  --   --   LABPROT 31.4* 41.7* 33.4*  INR 3.11* 4.52* 3.38*  CREATININE 0.59 0.55  0.56 0.62    Estimated Creatinine Clearance: 50.9 mL/min (by C-G formula based on Cr of 0.62).   Assessment: 80 yo f with afib on warfarin PTA. INR still elevated today at 3.38. CBC stable, no bleeding or issues noted.   Goal of Therapy:  INR 2-3 Monitor platelets by anticoagulation protocol: Yes   Plan:  Hold warfarin again tonight F/u INR in AM Monitor hgb/plts, s/s of bleeding  Cassie L. Roseanne Reno, PharmD Clinical Pharmacy Resident Pager: [phone] 03/30/2015 10:26 AM

## 2015-03-30 NOTE — Progress Notes (Signed)
Report given to [place] RN 3E. Will transport after patient finish eating lunch.

## 2015-03-30 NOTE — Progress Notes (Signed)
Pt was transferred to [facility] and was visibly short of breath on arrival.  Shortly after, pt oxygen sat read 88% on 4L N/C.  Pt oxygen was increased to 6L and pt oxygen sat increased to 90%.  MD was notified of change.  After administering 80 mg of lasix, the pt oxygen sat increased to 96% on 6L.  Will continue to monitor.

## 2015-03-30 NOTE — Progress Notes (Signed)
PULMONARY / CRITICAL CARE MEDICINE   Name: [name] MRN: [id number] DOB: [birth year]    ADMISSION DATE:  03/25/2015  REFERRING MD :  ED  CHIEF COMPLAINT:  Acute on Chronic Hypoxic Respiratory Failure  INITIAL PRESENTATION:  80 yo female with respiratory distress and hypoxia >> found to have positive legionella urine antigen.  She has hx of PAH with elevated rheumatoid factor/ANA >> recently started on tadalafil.  She has hx of BTX, COPD, CAD, Diastolic CHF, A fib.  Followed by Dr. Delton Coombes as outpt.  STUDIES: 12/25/14 Echo >> EF 55 to 60%, PAS 66 mmHg 01/16/15 Labs >> RNP Ab negative, SCL 70 negative, RF 49.1, ANA positive 01/17/15 V/Q scan >> low probability for PE 03/04/15 Labs >> ANA 1:160 homogenous pattern, anti DS DNA negative, RF 78.2 03/05/15 Lt/Rt heart cath >> RA 1, PV 43/1, PA 52/19 mean 31, PCWP 6, LV 94/1, AO 106/61, CI 2.09, PVR 8 WU, moderate CAD 03/06/15 CT chest >> patchy GGO, extensive thickening of interstitium, subpleural reticulation, scattered cylindrical BTX, mild air trapping 03/25/15 Legionella urine antigen positive  SIGNIFICANT EVENTS: 8/2 Admit, BiPAP, cardiology consulted 8/3 Off BiPAP 8/7 to Tele  SUBJECTIVE:  Breathing better.  Sat in chair yesterday.  Not as much cough.  Denies chest/abd pain.  C/o constipation.   VITAL SIGNS: Temp:  [97.4 F (36.3 C)-98.3 F (36.8 C)] 97.5 F (36.4 C) (08/07 0727) Pulse Rate:  [60-66] 60 (08/07 0400) Resp:  [15-36] 18 (08/07 0400) BP: (103-133)/(59-70) 123/69 mmHg (08/07 0727) SpO2:  [93 %-100 %] 98 % (08/07 0727) Weight:  [125 lb (56.7 kg)] 125 lb (56.7 kg) (08/07 0427) INTAKE / OUTPUT:  Intake/Output Summary (Last 24 hours) at 03/30/15 0918 Last data filed at 03/30/15 0600  Gross per 24 hour  Intake    500 ml  Output   2100 ml  Net  -1600 ml    PHYSICAL EXAMINATION: General: pleasant, mild increased WOB Neuro: alert, normal strength HEENT: No sinus tenderness PULM: faint basilar crackles, no  wheeze CV: regular GI: Soft, non tender Integument: no edema, no rashes   LABS:  CBC  Recent Labs Lab 03/26/15 0232 03/27/15 0224 03/28/15 0215  WBC 8.2 13.1* 13.3*  HGB 11.7* 11.9* 12.2  HCT 34.3* 35.1* 36.8  PLT 380 435* 485*   Coag's  Recent Labs Lab 03/28/15 0215 03/29/15 0234 03/30/15 0250  INR 3.11* 4.52* 3.38*   BMET  Recent Labs Lab 03/28/15 0215 03/29/15 0234 03/30/15 0250  NA 133* 131*  133* 132*  K 3.5 3.9  4.0 3.9  CL 96* 93*  93* 91*  CO2 32  BUN CREATININE 0.59 0.55  0.56 0.62  GLUCOSE 130* 126*  129* 135*   Electrolytes  Recent Labs Lab 03/25/15 1829 03/26/15 0232  03/28/15 0215 03/29/15 0234 03/30/15 0250  CALCIUM  --  7.7*  < > 7.7* 7.9*  7.9* 8.1*  MG 1.1* 1.8  --   --   --   --   PHOS 2.8 3.8  --   --  2.0*  --   < > = values in this interval not displayed.   Sepsis Markers  Recent Labs Lab 03/25/15 1829 03/26/15 0232 03/27/15 0224  PROCALCITON 0.14 0.27 0.15   ABG  Recent Labs Lab 03/25/15 1641  PHART 7.432  PCO2ART 30.9*  PO2ART 149.0*   Liver Enzymes  Recent Labs Lab 03/26/15 0232 03/27/15 0224 03/29/15 0234  AST  --  79*  --   ALT  --  79*  --   ALKPHOS  --  116  --   BILITOT  --  0.8  --   ALBUMIN 2.2* 2.1* 2.2*   Cardiac Enzymes  Recent Labs Lab 03/25/15 1649  TROPONINI 0.12*   Glucose  Recent Labs Lab 03/28/15 2031 03/29/15 0813 03/29/15 1250 03/29/15 1631 03/29/15 2122 03/30/15 0732  GLUCAP 188* 118* 114* 115* 186* 136*    Imaging Dg Chest Port 1 View  03/30/2015   CLINICAL DATA:  Congestive heart/ respiratory failure  EXAM: PORTABLE CHEST - 1 VIEW  COMPARISON:  03/28/2015 and 03/27/2015.  FINDINGS: 0459 hr. The left subclavian pacemaker leads are unchanged within the right atrium and right ventricle. There has been further interval clearing of the bilateral airspace opacities. No pneumothorax or significant pleural effusion identified. The heart  size and mediastinal contours are stable.  IMPRESSION: Further clearing of bilateral airspace opacities most consistent with resolving edema.   Electronically Signed   By: Carey Bullocks M.D.   On: 03/30/2015 07:02   MICRO DATA: Legionella Ag 8/02 >> positive Pneumococcal Ag 8/02 >> negative Urine culture 8/02 >> Pseudomonas aeruginosa, pan sensitive Blood 8/02 >>   ASSESSMENT / PLAN:  Acute on chronic respiratory failure. Discussion: Multifactorial, related to Legionella PNA, ILD flare, and pulmonary edema.  She had CT findings suggestive of NSIP or COP from July 2016.  She has BTX with COPD.  She is slowly improving. Plan: - oxygen to keep SpO2 > 92% - f/u CXR intermittently - change to prednisone 40 mg daily on 8/07 - resume spiriva - use nebulized pulmicort with prn albuterol  WHO group 1 pulmonary hypertension with positive RA, ANA. Plan: - continue tadalafil - f/u anti CCP Ab from 8/05  Acute on chronic diastolic heart failure. Hx of A fib, CAD, HTN, HLD. Plan: - continue lipitor - coumadin per pharmacy - lasix per cardiology  Sepsis from Legionella PNA and Pseudomonas UTI. Plan: - Day 6 of levaquin  Constipation. Plan: - adjust bowel regimen  Hx of PUD. Plan: - protonix  Hx of depression, insomnia. Plan: - continue lexapro - trazodone qhs prn  Steroid induced hyperglycemia. Plan: - SSI  Deconditioning. Plan: - PT  Goals of care. Plan: - DNI  Transfer to telemetry.  Coralyn Helling, MD [facility] Pulmonary/Critical Care 03/30/2015, 10:00 AM Pager:  [phone] After 3pm call: [phone]

## 2015-03-31 ENCOUNTER — Other Ambulatory Visit: Payer: Self-pay | Admitting: *Deleted

## 2015-03-31 ENCOUNTER — Telehealth: Payer: Self-pay | Admitting: Emergency Medicine

## 2015-03-31 ENCOUNTER — Encounter: Payer: Self-pay | Admitting: *Deleted

## 2015-03-31 LAB — HEPATIC FUNCTION PANEL
ALT: 110 U/L — ABNORMAL HIGH (ref 14–54)
AST: 109 U/L — ABNORMAL HIGH (ref 15–41)
Albumin: 2.4 g/dL — ABNORMAL LOW (ref 3.5–5.0)
Alkaline Phosphatase: 128 U/L — ABNORMAL HIGH (ref 38–126)
BILIRUBIN DIRECT: 0.3 mg/dL (ref 0.1–0.5)
BILIRUBIN TOTAL: 0.5 mg/dL (ref 0.3–1.2)
Indirect Bilirubin: 0.2 mg/dL — ABNORMAL LOW (ref 0.3–0.9)
Total Protein: 5.9 g/dL — ABNORMAL LOW (ref 6.5–8.1)

## 2015-03-31 LAB — GLUCOSE, CAPILLARY
GLUCOSE-CAPILLARY: 119 mg/dL — AB (ref 65–99)
GLUCOSE-CAPILLARY: 93 mg/dL (ref 65–99)
GLUCOSE-CAPILLARY: 173 mg/dL — AB (ref 65–99)
GLUCOSE-CAPILLARY: 219 mg/dL — AB (ref 65–99)

## 2015-03-31 LAB — BASIC METABOLIC PANEL
ANION GAP: 10 (ref 5–15)
BUN: 22 mg/dL — AB (ref 6–20)
CALCIUM: 8.1 mg/dL — AB (ref 8.9–10.3)
CHLORIDE: 91 mmol/L — AB (ref 101–111)
CO2: 30 mmol/L (ref 22–32)
Creatinine, Ser: 0.55 mg/dL (ref 0.44–1.00)
Glucose, Bld: 125 mg/dL — ABNORMAL HIGH (ref 65–99)
Potassium: 3.5 mmol/L (ref 3.5–5.1)
Sodium: 131 mmol/L — ABNORMAL LOW (ref 135–145)

## 2015-03-31 LAB — MAGNESIUM: Magnesium: 1.5 mg/dL — ABNORMAL LOW (ref 1.7–2.4)

## 2015-03-31 LAB — CBC
HCT: 43 % (ref 36.0–46.0)
HEMOGLOBIN: 14.1 g/dL (ref 12.0–15.0)
MCH: 28 pg (ref 26.0–34.0)
MCHC: 32.8 g/dL (ref 30.0–36.0)
MCV: 85.3 fL (ref 78.0–100.0)
Platelets: 501 10*3/uL — ABNORMAL HIGH (ref 150–400)
RBC: 5.04 MIL/uL (ref 3.87–5.11)
RDW: 15.7 % — AB (ref 11.5–15.5)
WBC: 15.2 10*3/uL — ABNORMAL HIGH (ref 4.0–10.5)

## 2015-03-31 LAB — PROTIME-INR
INR: 2.43 — ABNORMAL HIGH (ref 0.00–1.49)
PROTHROMBIN TIME: 26.1 s — AB (ref 11.6–15.2)

## 2015-03-31 MED ORDER — MAGNESIUM SULFATE 2 GM/50ML IV SOLN
2.0000 g | Freq: Once | INTRAVENOUS | Status: AC
  Administered 2015-03-31: 2 g via INTRAVENOUS
  Filled 2015-03-31: qty 50

## 2015-03-31 MED ORDER — SILDENAFIL CITRATE 20 MG PO TABS: 20.0000 mg | ORAL_TABLET | Freq: Three times a day (TID) | ORAL | Status: DC

## 2015-03-31 MED ORDER — WARFARIN 1.25 MG HALF TABLET
1.2500 mg | ORAL_TABLET | Freq: Once | ORAL | Status: AC
  Administered 2015-03-31: 1.25 mg via ORAL
  Filled 2015-03-31: qty 1

## 2015-03-31 MED ORDER — POTASSIUM CHLORIDE CRYS ER 20 MEQ PO TBCR
40.0000 meq | EXTENDED_RELEASE_TABLET | Freq: Once | ORAL | Status: AC
  Administered 2015-03-31: 40 meq via ORAL
  Filled 2015-03-31: qty 2

## 2015-03-31 MED ORDER — SILDENAFIL CITRATE 20 MG PO TABS
20.0000 mg | ORAL_TABLET | Freq: Three times a day (TID) | ORAL | Status: DC
  Administered 2015-03-31 – 2015-04-03 (×9): 20 mg via ORAL
  Filled 2015-03-31 (×11): qty 1

## 2015-03-31 MED ORDER — AMBRISENTAN 5 MG PO TABS: 5.0000 mg | ORAL_TABLET | Freq: Every day | ORAL | Status: DC

## 2015-03-31 NOTE — Progress Notes (Signed)
ANTICOAGULATION + ANTIBIOTIC CONSULT NOTE - Follow Up Consult  Pharmacy Consult for warfarin + levaquin Indication: atrial fibrillation + legionella pneumonia  Allergies  Allergen Reactions  . Benzoyl Peroxide Swelling    Swelling at the site of use  . Neomycin-Bacitracin Zn-Polymyx Swelling    Swelling at the site of use    Patient Measurements: Height: 5\' 4"  (162.6 cm) Weight: 120 lb 5.9 oz (54.6 kg) IBW/kg (Calculated) : 54.7  Vital Signs: Temp: 98.1 F (36.7 C) (08/08 0505) Temp Source: Oral (08/08 0505) BP: 118/70 mmHg (08/08 0505) Pulse Rate: 59 (08/08 0505)  Labs:  Recent Labs  03/29/15 0234 03/30/15 0250 03/31/15 0455  HGB  --   --  14.1  HCT  --   --  43.0  PLT  --   --  501*  LABPROT 41.7* 33.4* 26.1*  INR 4.52* 3.38* 2.43*  CREATININE 0.55  0.56 0.62 0.55    Estimated Creatinine Clearance: 50.8 mL/min (by C-G formula based on Cr of 0.55).   Assessment: 80yo female with Afib on warfarin PTA.  Warfarin held 8/5 - 8/7 d/t supratherapetuic INR.  INR now back down to therapeutic range at 2.43. CBC and PLT stable, no bleeding or issues noted.  Levaquin started 8/3, which can increase INR.  PTA warfarin dose: 2.5mg  SunTTh, 1.25mg  all other days ^Per [facility] note 6/8  Levaquin D#6 for Legionella PNA - Afebrile, WBC 15.2 (on steroids)  8/2 BCx x2 - no growth (final) 8/2 UCx: >100K Psuedomonas (pan sensitive) 8/2 MRSA neg 8/2 legionella ag: POS 8/2 Strep pneumo: neg  Vanc 8/2>>8/3 Elita Quick 8/2>>8/3 Levaquin 8/3 >> (typically 7-10 days)  Goal of Therapy:  INR 2-3 Monitor platelets by anticoagulation protocol: Yes Eradication of infection  Plan:  - Warfarin 1.25mg  x1 - Monitor daily INR, CBC, s/sx of bleeding - Continue Levaquin 750mg  Q24h - Monitor renal fxn (Levaquin may require adjustment if CrCl declines), LOT   Waynette Buttery, PharmD Clinical Pharmacist Pager: [phone] 03/31/2015 10:02 AM

## 2015-03-31 NOTE — Care Management Note (Signed)
Case Management Note  Patient Details  Name: [name] MRN: [id number] Date of Birth: 15-Apr-1935  Subjective/Objective:    Admitted with Respiratory Failure                Action/Plan: CM talked to spouse [name] at bedside about DCP - plan to take the patient home at discharge with [facility] services provided by [facility]. Tiffany with [facility] called for arrangements. Attending MD please sign off orders for hospital bed so that it will be delivered to the home for discharge.  Expected Discharge Date:    possibly 04/01/2015              Expected Discharge Plan:  Home w Home Health Services  Discharge planning Services  CM Consult  Post Acute Care Choice:    Choice offered to:  Patient, Spouse  DME Arranged:  Hospital bed DME Agency:  [facility], [facility]  HH Arranged:  RN, PT, OT, Nurse's Aide [facility] Agency:  [facility]  Status of Service:  In process, will continue to follow  Medicare Important Message Given:  [facility] notification given  Reola Mosher [phone] 03/31/2015, 11:24 AM

## 2015-03-31 NOTE — Progress Notes (Signed)
CSW notified of patient admitted 03/25/15 from [facility]. She was transferred to [facility] from [facility].  CSW met with patient and her husband this morning to determine options for d/c as was notified that she did not wish to return to [facility].  Patient verbalized multiple times during the visit that she would NOT return to [facility] and her husband fully supports this. They plan to return home and request home health services.  Patient already has an oxygen concentrator and tanks, a wheelchair, walker and tub railings.  She could benefit from a hospital bed as she will have to sleep downstairs. Husband states that the family will move the couch to make room for a hospital bed.  She declines wanting a BSC- however- CSW strongly encouraged her to reconsider this due to weakness.  Husband is requesting HH for PT, RN, and [place] aide. While they did not have a preference for a [place]- when CSW mentioned local companies- they stated that they wanted to use  [facility].  CSW notified unit RNCM of above.  Will follow patient as anticipate need for EMS transport when stable for d/c home.  CSW also notified Janie- Admissions at [facility] that patient would not be returning to SNF. Patient and husband stated that they were pleased with the care she received- but patient was unhappy and simply wants to return home.  Husband relates that he is home all the time, they have 3 supportive sons and other help and feel that they can manage at home.  [facility] services are also available to provide supportive services.  Lorie Phenix. Pauline Good, [place]

## 2015-03-31 NOTE — Addendum Note (Signed)
Addended by: Jaynee Eagles C on: 03/31/2015 08:52 AM   Modules accepted: Orders

## 2015-03-31 NOTE — Telephone Encounter (Signed)
[name], please advise if this process has already been started. Thanks.

## 2015-03-31 NOTE — Telephone Encounter (Signed)
Spoke with Dr. Delton Coombes this morning. Pt is currently at [facility]. Due to her liver function testing, she DOES NOT qualify for Letairis. Letairis will need to be changed to Sildenafil per RB. Sildenafil prescription will be sent to [facility] in place of [facility].

## 2015-03-31 NOTE — Progress Notes (Signed)
Chart reviewed, patient is currently set up with [facility] program for [facility] services; HHC discussed with patient / spouse [name] again. They agreed to stay with [facility] for the [facility] program. Otelia Santee with [facility] will see pt/ spouse today to talk to them about their program. Alexis Goodell [phone]

## 2015-03-31 NOTE — Telephone Encounter (Addendum)
Spoke with pt's husband. Advised him that I would send in this prescription to [facility]. Nothing further was needed at this time.

## 2015-03-31 NOTE — Care Management Important Message (Signed)
Important Message  Patient Details  Name: [name] MRN: [id number] Date of Birth: [birth year]   Medicare Important Message Given:  Yes-third notification given    Yvonna Alanis 03/31/2015, 2:59 PM

## 2015-03-31 NOTE — Telephone Encounter (Signed)
Yes please start letaris 5mg  qd if the approval process is complete.  Thanks

## 2015-03-31 NOTE — Patient Outreach (Signed)
[facility]) Care Management  03/31/2015  [name] 06/26/1935 [id number]   CSW was able to make contact with patient's husband, [name] today to follow-up regarding patient's recent hospitalization.  Patient was admitted to [facility] from [facility]illed [facility], on August 2nd, with Acute on Chronic Hypoxic Respiratory Failure. Patient remains inpatient, but has been transferred from the critical care unit to telemetry.  Mr. Stroebel was at bedside at the time of CSW's call.  Mr. Victoria reported that the hospital is waiting to start Letaris 5mg  qd, once approval process is complete with [employer].  Patient is also receiving intravenous antibiotics (Vancomycin 500mg  q12h and Ceftazidime 2g q8h, for expected duration of 7 days). Mr. Dohrmann indicated that patient is adamant about returning home to live at time of discharge, despite recommendations of short-term skilled nursing placement for rehabilitative services.  CSW explained to Mr. Kollmar that an inpatient nurse case manager, assigned to patient's case, will be meeting with patient and Mr. Bahn to discuss choice of home health agencies, once a tentative discharge date has been set for patient.  CSW is aware that patient's inpatient hospital physician is recommending home health physical therapy, occupational therapy, nursing, aide and social work services.  CSW reminded Mr. Dhondt that home health services are in addition to services that an [facility] and CSW are able to provide to patient through [facility] Care Management.  Mr. Ishibashi voiced understanding.  Mr. Crumrine is agreeable to providing patient with 24 hour care and supervision when patient returns home.  Patient will need to be transported via non-emergency ambulance transport through [facility]), as patient is currently maximum assistance with transfers and ambulation.  CSW will continue to follow  patient while hospitalized to assess and assist with social work needs and services.   Danford Bad, BSW, MSW, LCSW [facility] [address] [place], [address] [place], [place] [address] [email or web address] [phone]

## 2015-03-31 NOTE — Progress Notes (Signed)
PULMONARY / CRITICAL CARE MEDICINE   Name: [name] MRN: [id number] DOB: 1935/05/05    ADMISSION DATE:  03/25/2015  REFERRING MD :  ED  CHIEF COMPLAINT:  Acute on Chronic Hypoxic Respiratory Failure  INITIAL PRESENTATION:  80 yo female with respiratory distress and hypoxia >> found to have positive legionella urine antigen.  She has hx of PAH with elevated rheumatoid factor/ANA >> recently started on tadalafil.  She has hx of BTX, COPD, CAD, Diastolic CHF, A fib.  Followed by Dr. Delton [name] as outpt.  STUDIES: 12/25/14 Echo >> EF 55 to 60%, PAS 66 mmHg 01/16/15 Labs >> RNP Ab negative, SCL 70 negative, RF 49.1, ANA positive 01/17/15 V/Q scan >> low probability for PE 03/04/15 Labs >> ANA 1:160 homogenous pattern, anti DS DNA negative, RF 78.2 03/05/15 Lt/Rt heart cath >> RA 1, PV 43/1, PA 52/19 mean 31, PCWP 6, LV 94/1, AO 106/61, CI 2.09, PVR 8 WU, moderate CAD 03/06/15 CT chest >> patchy GGO, extensive thickening of interstitium, subpleural reticulation, scattered cylindrical BTX, mild air trapping 03/25/15 Legionella urine antigen positive  SIGNIFICANT EVENTS: 8/2 Admit, BiPAP, cardiology consulted 8/3 Off BiPAP 8/7 to Tele  SUBJECTIVE:  Breathing somewhat better.   Down to nasal cannula  Denies chest/abd pain.     VITAL SIGNS: Temp:  [97.5 F (36.4 C)-98.1 F (36.7 C)] 98.1 F (36.7 C) (08/08 0505) Pulse Rate:  [58-68] 59 (08/08 0505) Resp:  [20-22] 20 (08/08 0505) BP: (108-118)/(58-70) 118/70 mmHg (08/08 0505) SpO2:  [91 %-96 %] 94 % (08/08 0821) Weight:  [120 lb 5.9 oz (54.6 kg)] 120 lb 5.9 oz (54.6 kg) (08/08 0505) INTAKE / OUTPUT:  Intake/Output Summary (Last 24 hours) at 03/31/15 1718 Last data filed at 03/31/15 1100  Gross per 24 hour  Intake    780 ml  Output   1850 ml  Net  -1070 ml    PHYSICAL EXAMINATION: General: pleasant, mild increased WOB Neuro: alert, normal strength HEENT: No sinus tenderness PULM: faint basilar crackles, no wheeze CV: regular GI:  Soft, non tender Integument: no edema, no rashes   LABS:  CBC  Recent Labs Lab 03/27/15 0224 03/28/15 0215 03/31/15 0455  WBC 13.1* 13.3* 15.2*  HGB 11.9* 12.2 14.1  HCT 35.1* 36.8 43.0  PLT 435* 485* 501*   Coag's  Recent Labs Lab 03/29/15 0234 03/30/15 0250 03/31/15 0455  INR 4.52* 3.38* 2.43*   BMET  Recent Labs Lab 03/29/15 0234 03/30/15 0250 03/31/15 0455  NA 131*  133* 132* 131*  K 3.9  4.0 3.9 3.5  CL 93*  93* 91* 91*  CO2 30  31 32 30  BUN 22*  CREATININE 0.55  0.56 0.62 0.55  GLUCOSE 126*  129* 135* 125*   Electrolytes  Recent Labs Lab 03/25/15 1829 03/26/15 0232  03/29/15 0234 03/30/15 0250 03/31/15 0455  CALCIUM  --  7.7*  < > 7.9*  7.9* 8.1* 8.1*  MG 1.1* 1.8  --   --   --  1.5*  PHOS 2.8 3.8  --  2.0*  --   --   < > = values in this interval not displayed.   Sepsis Markers  Recent Labs Lab 03/25/15 1829 03/26/15 0232 03/27/15 0224  PROCALCITON 0.14 0.27 0.15   ABG  Recent Labs Lab 03/25/15 1641  PHART 7.432  PCO2ART 30.9*  PO2ART 149.0*   Liver Enzymes  Recent Labs Lab 03/26/15 0232 03/27/15 0224 03/29/15 0234  AST  --  79*  --   ALT  --  79*  --   ALKPHOS  --  116  --   BILITOT  --  0.8  --   ALBUMIN 2.2* 2.1* 2.2*   Cardiac Enzymes  Recent Labs Lab 03/25/15 1649  TROPONINI 0.12*   Glucose  Recent Labs Lab 03/29/15 2122 03/30/15 0732 03/30/15 1138 03/30/15 2326 03/31/15 0614 03/31/15 1328  GLUCAP 186* 136* 163* 151* 119* 93    Imaging No results found. MICRO DATA: Legionella Ag 8/02 >> positive Pneumococcal Ag 8/02 >> negative Urine culture 8/02 >> Pseudomonas aeruginosa, pan sensitive Blood 8/02 >>   ASSESSMENT / PLAN:  Acute on chronic respiratory failure. Discussion: Multifactorial, related to Legionella PNA, ILD flare, and pulmonary edema.  She had CT findings suggestive of NSIP or COP from July 2016.  She has BTX with COPD.  She is slowly improving. Rapid  clearing of infiltrates seems to suggest pulmonary edema and scars Plan: - oxygen to keep SpO2 > 92% - change to prednisone 40 mg daily - taper over 2 weeks - resume spiriva - use nebulized pulmicort with prn albuterol  WHO group 1 pulmonary hypertension with positive RA, ANA. Plan: - continue tadalafil (PA was rejected-will have to use sildenafil 20 3 times a day as outpatient -We'll check LFTs in anticipation of starting ambrisentan as outpatient - f/u anti CCP Ab from 8/05  Acute on chronic diastolic heart failure. Hx of A fib, CAD, HTN, HLD. Plan: - continue lipitor - coumadin per pharmacy - lasix per cardiology  Sepsis from Legionella PNA and Pseudomonas UTI. Plan: - Complete levaquin x 10 days  Constipation. Plan: - adjust bowel regimen  Hx of PUD. Plan: - protonix  Hx of depression, insomnia. Plan: - continue lexapro - trazodone qhs prn  Steroid induced hyperglycemia. Plan: - SSI  Deconditioning. Plan: - PT  Goals of care. Plan: - DNI -Discharge-she had a poor experience it [name] and would rather go home, she has a supportive family and equipment including oxygen  Answered all of husband's questions [name]. MD   03/31/2015, 5:18 PM

## 2015-04-01 ENCOUNTER — Ambulatory Visit: Payer: Commercial Managed Care - HMO | Admitting: *Deleted

## 2015-04-01 ENCOUNTER — Inpatient Hospital Stay (HOSPITAL_COMMUNITY): Payer: Commercial Managed Care - HMO

## 2015-04-01 DIAGNOSIS — I270 Primary pulmonary hypertension: Secondary | ICD-10-CM

## 2015-04-01 LAB — GLUCOSE, CAPILLARY
GLUCOSE-CAPILLARY: 155 mg/dL — AB (ref 65–99)
Glucose-Capillary: 103 mg/dL — ABNORMAL HIGH (ref 65–99)
Glucose-Capillary: 142 mg/dL — ABNORMAL HIGH (ref 65–99)
Glucose-Capillary: 233 mg/dL — ABNORMAL HIGH (ref 65–99)

## 2015-04-01 LAB — BASIC METABOLIC PANEL
ANION GAP: 10 (ref 5–15)
BUN: 22 mg/dL — ABNORMAL HIGH (ref 6–20)
CHLORIDE: 89 mmol/L — AB (ref 101–111)
CO2: 32 mmol/L (ref 22–32)
CREATININE: 0.6 mg/dL (ref 0.44–1.00)
Calcium: 8.2 mg/dL — ABNORMAL LOW (ref 8.9–10.3)
GFR calc Af Amer: >60 mL/min (ref >60)
GFR calc non Af Amer: >60 mL/min (ref >60)
GLUCOSE: 100 mg/dL — AB (ref 65–99)
POTASSIUM: 4.1 mmol/L (ref 3.5–5.1)
SODIUM: 131 mmol/L — AB (ref 135–145)

## 2015-04-01 LAB — PROTIME-INR
INR: 1.58 — AB (ref 0.00–1.49)
Prothrombin Time: 18.9 seconds — ABNORMAL HIGH (ref 11.6–15.2)

## 2015-04-01 LAB — PHOSPHORUS: PHOSPHORUS: 2.9 mg/dL (ref 2.5–4.6)

## 2015-04-01 LAB — MAGNESIUM: Magnesium: 2.1 mg/dL (ref 1.7–2.4)

## 2015-04-01 MED ORDER — PREDNISONE 20 MG PO TABS
40.0000 mg | ORAL_TABLET | Freq: Every day | ORAL | Status: DC
Start: 2015-04-01 — End: 2015-04-03
  Administered 2015-04-01 – 2015-04-03 (×3): 40 mg via ORAL
  Filled 2015-04-01 (×4): qty 2

## 2015-04-01 MED ORDER — WARFARIN SODIUM 3 MG PO TABS
3.0000 mg | ORAL_TABLET | Freq: Once | ORAL | Status: AC
  Administered 2015-04-01: 3 mg via ORAL
  Filled 2015-04-01: qty 1

## 2015-04-01 NOTE — Progress Notes (Signed)
Physical Therapy Treatment Patient Details Name: [name] MRN: [id number] DOB: Apr 19, 1935 Today's Date: 04/01/2015    History of Present Illness Patient admitted from SNF with subjective symptoms suggestive of an infectious etiology to her SIRS as well as acute on chronic hypoxic respiratory failure; abnormal rheumatological studies (RA and ANA)    PT Comments    [name] presents today with extremely decreased functional capacity;  Upon simple pivot OOB to chair on 6L O2 via Nasal Cannula, her O2 sats dropped 80% and required use of non-rebreather mask to incr O2 sats back to above 90%;   I have reservations re: her ability to dc home safely, given her severe deconditioning;  Paulette, RN and [name] Dopp, NP with Critical Care aware.  Is it time to consider Palliative Care consult, and discuss goals of care with [name]?   Follow Up Recommendations  Other (comment)  [name] continue to choose going home instead of SNF;  If they ultimately choose home, will recommend maximizing [facility] services.     Equipment Recommendations  Hospital bed;Wheelchair (measurements PT);Wheelchair cushion (measurements PT) (Ambulance transport home)    Recommendations for Other Services OT consult     Precautions / Restrictions Precautions Precautions: Fall Precaution Comments: Desats with minimal activity on supplemental O2 Restrictions Weight Bearing Restrictions: No    Mobility  Bed Mobility Overal bed mobility: Needs Assistance Bed Mobility: Supine to Sit     Supine to sit: Mod assist     General bed mobility comments: Mod antigravity assist to elevate trunk to sit; Mod assist and verbal and tactile cueing to weight shift for scooting to EOB; poor endurance  Transfers Overall transfer level: Needs assistance Equipment used: 2 person hand held assist Transfers: Stand Pivot Transfers   Stand pivot transfers: Mod assist       General transfer comment: Light  mod assist to power up; poor endurance, and decr functional capacity, with O2 sats dropping to 80% with this minimal activity even on 6L O2 via nasal cannula; required non-rebreather mask and seated rest to get O2 sats back up above 90%  Ambulation/Gait                 Stairs            Wheelchair Mobility    Modified Rankin (Stroke Patients Only)       Balance             Standing balance-Leahy Scale: Poor                      Cognition Arousal/Alertness: Awake/alert Behavior During Therapy: WFL for tasks assessed/performed Overall Cognitive Status: Within Functional Limits for tasks assessed                      Exercises      General Comments General comments (skin integrity, edema, etc.): [name] Minor, NP with Critical Care updated on [name]'s status      Pertinent Vitals/Pain Pain Assessment: No/denies pain    Home Living                      Prior Function            PT Goals (current goals can now be found in the care plan section) Acute Rehab PT Goals Patient Stated Goal: to go home; clearly shakes head "no" with any mention of SNF for dc PT  Goal Formulation: With patient/family Time For Goal Achievement: 04/13/15 Potential to Achieve Goals: Good Progress towards PT goals: Not progressing toward goals - comment (Huge drop in O2 sats with minimal activity)    Frequency  Min 3X/week    PT Plan Other (comment)    Co-evaluation             End of Session Equipment Utilized During Treatment: Oxygen Activity Tolerance: Patient limited by fatigue;Other (comment) (significantly decreased functional capacity) Patient left: in chair;with call bell/phone within reach;with family/visitor present;Other (comment) (Respiratory Therapist in room)     Time: 6812-7517 PT Time Calculation (min) (ACUTE ONLY): 15 min  Charges:  $Therapeutic Activity: 8-22 mins                    G Codes:      [name] 04/01/2015, 11:26 AM  [name] Clines, PT  Acute Rehabilitation Services Pager [phone] Office [phone]

## 2015-04-01 NOTE — Telephone Encounter (Signed)
Med should be coming from [facility]

## 2015-04-01 NOTE — Progress Notes (Signed)
Pt transitioned back to Nasal Canula @ 6 l, per Dr Vassie Loll recommendations; saturations 89%-91%, sitting up in the chair.

## 2015-04-01 NOTE — Progress Notes (Signed)
PULMONARY / CRITICAL CARE MEDICINE   Name: [name] MRN: [id number] DOB: 02-08-1935    ADMISSION DATE:  03/25/2015  REFERRING MD :  ED  CHIEF COMPLAINT:  Acute on Chronic Hypoxic Respiratory Failure  INITIAL PRESENTATION:  80 yo female with respiratory distress and hypoxia >> found to have positive legionella urine antigen.  She has hx of PAH with elevated rheumatoid factor/ANA >> recently started on tadalafil.  She has hx of BTX, COPD, CAD, Diastolic CHF, A fib.  Followed by Dr. Delton Coombes as outpt.  STUDIES: 12/25/14 Echo >> EF 55 to 60%, PAS 66 mmHg 01/16/15 Labs >> RNP Ab negative, SCL 70 negative, RF 49.1, ANA positive 01/17/15 V/Q scan >> low probability for PE 03/04/15 Labs >> ANA 1:160 homogenous pattern, anti DS DNA negative, RF 78.2 03/05/15 Lt/Rt heart cath >> RA 1, PV 43/1, PA 52/19 mean 31, PCWP 6, LV 94/1, AO 106/61, CI 2.09, PVR 8 WU, moderate CAD 03/06/15 CT chest >> patchy GGO, extensive thickening of interstitium, subpleural reticulation, scattered cylindrical BTX, mild air trapping 03/25/15 Legionella urine antigen positive  SIGNIFICANT EVENTS: 8/2 Admit, BiPAP, cardiology consulted 8/3 Off BiPAP 8/7 to Tele  SUBJECTIVE:  Increased wob noted   VITAL SIGNS: Temp:  [97.8 F (36.6 C)-98.3 F (36.8 C)] 98.3 F (36.8 C) (08/09 0458) Pulse Rate:  [62-80] 75 (08/09 0458) Resp:  [20-24] 22 (08/09 0458) BP: (93-104)/(59-69) 100/69 mmHg (08/09 0458) SpO2:  [92 %-94 %] 92 % (08/09 0755) Weight:  [117 lb 4.8 oz (53.207 kg)] 117 lb 4.8 oz (53.207 kg) (08/09 0458) INTAKE / OUTPUT:  Intake/Output Summary (Last 24 hours) at 04/01/15 0851 Last data filed at 04/01/15 0600  Gross per 24 hour  Intake    680 ml  Output   2100 ml  Net  -1420 ml    PHYSICAL EXAMINATION: General: pleasant, increased WOB, very weak. Neuro: alert, normal strength HEENT: No sinus tenderness PULM: faint basilar crackles, no wheeze CV: regular GI: Soft, non tender Integument: no edema, no  rashes   LABS:  CBC  Recent Labs Lab 03/27/15 0224 03/28/15 0215 03/31/15 0455  WBC 13.1* 13.3* 15.2*  HGB 11.9* 12.2 14.1  HCT 35.1* 36.8 43.0  PLT 435* 485* 501*   Coag's  Recent Labs Lab 03/30/15 0250 03/31/15 0455 04/01/15 0409  INR 3.38* 2.43* 1.58*   BMET  Recent Labs Lab 03/30/15 0250 03/31/15 0455 04/01/15 0409  NA 132* 131* 131*  K 3.9 3.5 4.1  CL 91* 91* 89*  CO2 32 30 32  BUN 19 22* 22*  CREATININE 0.62 0.55 0.60  GLUCOSE 135* 125* 100*   Electrolytes  Recent Labs Lab 03/26/15 0232  03/29/15 0234 03/30/15 0250 03/31/15 0455 04/01/15 0409  CALCIUM 7.7*  < > 7.9*  7.9* 8.1* 8.1* 8.2*  MG 1.8  --   --   --  1.5* 2.1  PHOS 3.8  --  2.0*  --   --  2.9  < > = values in this interval not displayed.   Sepsis Markers  Recent Labs Lab 03/25/15 1829 03/26/15 0232 03/27/15 0224  PROCALCITON 0.14 0.27 0.15   ABG  Recent Labs Lab 03/25/15 1641  PHART 7.432  PCO2ART 30.9*  PO2ART 149.0*   Liver Enzymes  Recent Labs Lab 03/27/15 0224 03/29/15 0234 03/31/15 0455  AST 79*  --  109*  ALT 79*  --  110*  ALKPHOS 116  --  128*  BILITOT 0.8  --  0.5  ALBUMIN 2.1* 2.2*  2.4*   Cardiac Enzymes  Recent Labs Lab 03/25/15 1649  TROPONINI 0.12*   Glucose  Recent Labs Lab 03/30/15 2326 03/31/15 0614 03/31/15 1328 03/31/15 1721 03/31/15 2158 04/01/15 0602  GLUCAP 151* 119* 93 173* 219* 103*    Imaging No results found. MICRO DATA: Legionella Ag 8/02 >> positive Pneumococcal Ag 8/02 >> negative Urine culture 8/02 >> Pseudomonas aeruginosa, pan sensitive Blood 8/02 >>neg   ASSESSMENT / PLAN:  Acute on chronic respiratory failure. Discussion: Multifactorial, related to Legionella PNA, ILD flare, and pulmonary edema.  She had CT findings suggestive of NSIP or COP from July 2016.  She has BTX with COPD.  She is slowly improving. Rapid clearing of infiltrates seems to suggest pulmonary edema and scars Plan: - oxygen to  keep SpO2 > 92% - change to prednisone 40 mg daily - taper over 2 weeks - resume spiriva - use nebulized pulmicort with prn albuterol  WHO group 1 pulmonary hypertension with positive RA, ANA. Plan: - continue tadalafil (PA was rejected-will have to use sildenafil 20 3 times a day as outpatient -We'll check LFTs in anticipation of starting ambrisentan as outpatient, note mild elevation. - f/u anti CCP Ab from 8/05  Acute on chronic diastolic heart failure. Hx of A fib, CAD, HTN, HLD. Plan: - continue lipitor - coumadin per pharmacy - lasix per cardiology  Sepsis from Legionella PNA and Pseudomonas UTI. Plan: - Complete levaquin x 10 days  Constipation. No bm 8/9 Plan: - adjust bowel regimen  Hx of PUD. Plan: - protonix  Hx of depression, insomnia. Plan: - continue lexapro - trazodone qhs prn  Steroid induced hyperglycemia. Plan: - SSI  Deconditioning. Plan: - PT has not seen yet will order. 8/9 Goals of care. Plan: - DNI -Discharge-she had a poor experience it [facility] and would rather go home, she has a supportive family and equipment including oxygen. She is very weak and has not been out of bed or had PT. Note she is to start Letaris 5 mg qd when discharged per Dr. Delton Coombes. I suspect she is not able to be discharged at this time.  Spoke with husband about caring for his wife at home.  Brett Canales [name] ACNP [facility] PCCM Pager [phone] till 3 pm If no answer page [phone] 04/01/2015, 8:51 AM

## 2015-04-01 NOTE — Progress Notes (Signed)
ANTICOAGULATION CONSULT NOTE - Follow Up Consult  Pharmacy Consult for warfarin Indication: atrial fibrillation  Allergies  Allergen Reactions  . Benzoyl Peroxide Swelling    Swelling at the site of use  . Neomycin-Bacitracin Zn-Polymyx Swelling    Swelling at the site of use    Patient Measurements: Height: 5\' 4"  (162.6 cm) Weight: 117 lb 4.8 oz (53.207 kg) (bed scale; ) IBW/kg (Calculated) : 54.7  Vital Signs: Temp: 98.3 F (36.8 C) (08/09 0458) Temp Source: Oral (08/09 0458) BP: 100/69 mmHg (08/09 0458) Pulse Rate: 75 (08/09 0458)  Labs:  Recent Labs  03/30/15 0250 03/31/15 0455 04/01/15 0409  HGB  --  14.1  --   HCT  --  43.0  --   PLT  --  501*  --   LABPROT 33.4* 26.1* 18.9*  INR 3.38* 2.43* 1.58*  CREATININE 0.62 0.55 0.60    Estimated Creatinine Clearance: 49.5 mL/min (by C-G formula based on Cr of 0.6).   Assessment: 80yo female with Afib on warfarin PTA.  Warfarin held 8/5 - 8/7 d/t supratherapetuic INR.  INR dropped to subtherapeutic at 1.58 today after 3 days of holding Coumadin. CBC and PLT stable, no bleeding or issues noted.  Levaquin started 8/3, which can increase INR.  PTA warfarin dose: 2.5mg  SunTTh, 1.25mg  all other days ^Per [facility] note 6/8  Goal of Therapy:  INR 2-3 Monitor platelets by anticoagulation protocol: Yes  Plan:  - Warfarin 3mg  x1 now - Monitor daily INR, CBC, s/sx of bleeding   Waynette Buttery, PharmD Clinical Pharmacist Pager: [phone] 04/01/2015 1:07 PM

## 2015-04-01 NOTE — Progress Notes (Signed)
Physical Therapy Treatment Note (Brief PT note with full note to follow)  Clinical Impression: Ms. Ghazal presents today with extremely decreased functional capacity;  Upon simple pivot OOB to chair on 6L O2 via Nasal Cannula, her O2 sats dropped 80% and required use of non-rebreather mask to incr O2 sats back to above 90%;   I have reservations re: her ability to dc home safely, given her severe deconditioning;  Paulette, RN and Devra Dopp, NP with Critical Care aware.  Van Clines, [place]  Acute Rehabilitation Services Pager [phone] Office [phone]

## 2015-04-02 DIAGNOSIS — Z515 Encounter for palliative care: Secondary | ICD-10-CM

## 2015-04-02 LAB — BASIC METABOLIC PANEL
ANION GAP: 11 (ref 5–15)
BUN: 24 mg/dL — ABNORMAL HIGH (ref 6–20)
CALCIUM: 8.4 mg/dL — AB (ref 8.9–10.3)
CO2: 32 mmol/L (ref 22–32)
Chloride: 88 mmol/L — ABNORMAL LOW (ref 101–111)
Creatinine, Ser: 0.63 mg/dL (ref 0.44–1.00)
GFR calc Af Amer: >60 mL/min (ref >60)
Glucose, Bld: 93 mg/dL (ref 65–99)
POTASSIUM: 4.3 mmol/L (ref 3.5–5.1)
SODIUM: 131 mmol/L — AB (ref 135–145)

## 2015-04-02 LAB — GLUCOSE, CAPILLARY
GLUCOSE-CAPILLARY: 93 mg/dL (ref 65–99)
Glucose-Capillary: 134 mg/dL — ABNORMAL HIGH (ref 65–99)
Glucose-Capillary: 194 mg/dL — ABNORMAL HIGH (ref 65–99)
Glucose-Capillary: 149 mg/dL — ABNORMAL HIGH (ref 65–99)

## 2015-04-02 LAB — PROTIME-INR
INR: 1.65 — ABNORMAL HIGH (ref 0.00–1.49)
Prothrombin Time: 19.5 seconds — ABNORMAL HIGH (ref 11.6–15.2)

## 2015-04-02 MED ORDER — MORPHINE SULFATE 2 MG/ML IJ SOLN
2.0000 mg | INTRAMUSCULAR | Status: DC | PRN
  Administered 2015-04-02: 2 mg via INTRAVENOUS
  Filled 2015-04-02: qty 1

## 2015-04-02 MED ORDER — GLUCERNA SHAKE PO LIQD
237.0000 mL | Freq: Two times a day (BID) | ORAL | Status: DC
  Administered 2015-04-02 – 2015-04-03 (×2): 237 mL via ORAL

## 2015-04-02 MED ORDER — SENNOSIDES-DOCUSATE SODIUM 8.6-50 MG PO TABS
1.0000 | ORAL_TABLET | Freq: Every day | ORAL | Status: DC
  Filled 2015-04-02: qty 1

## 2015-04-02 MED ORDER — MORPHINE SULFATE 25 MG/ML IV SOLN
1.0000 mg/h | INTRAVENOUS | Status: DC
  Administered 2015-04-02: 2 mg/h via INTRAVENOUS
  Filled 2015-04-02: qty 10

## 2015-04-02 MED ORDER — PRO-STAT SUGAR FREE PO LIQD
30.0000 mL | Freq: Every day | ORAL | Status: DC
  Administered 2015-04-02: 30 mL via ORAL
  Filled 2015-04-02 (×2): qty 30

## 2015-04-02 MED ORDER — MORPHINE SULFATE (CONCENTRATE) 10 MG/0.5ML PO SOLN
2.5000 mg | ORAL | Status: DC | PRN
  Administered 2015-04-02: 2.6 mg via ORAL
  Filled 2015-04-02 (×2): qty 0.5

## 2015-04-02 MED ORDER — WARFARIN 1.25 MG HALF TABLET
1.2500 mg | ORAL_TABLET | Freq: Once | ORAL | Status: AC
  Administered 2015-04-02: 1.25 mg via ORAL
  Filled 2015-04-02: qty 1

## 2015-04-02 MED ORDER — SODIUM CHLORIDE 0.9 % IV BOLUS (SEPSIS)
500.0000 mL | Freq: Once | INTRAVENOUS | Status: AC
  Administered 2015-04-02: 500 mL via INTRAVENOUS

## 2015-04-02 NOTE — Progress Notes (Signed)
eLink Physician-Brief Progress Note Patient Name: [name] DOB: 14-Jul-1935 MRN: [id number]   Date of Service  04/02/2015  HPI/Events of Note  Hypotension - SBP in 80's s/p morphine IV infusion.   eICU Interventions  Will order: 1. 0.9 NaCl IV bolus. 2. Hold morphine IV infusion until BP improves then restart at half the current rate.      Intervention Category Intermediate Interventions: Hypotension - evaluation and management  Sommer,Steven Eugene 04/02/2015, 11:07 PM

## 2015-04-02 NOTE — Consult Note (Signed)
Consultation Note Date: 04/02/2015   Patient Name: [name]  DOB: 11/15/1934  MRN: [id number]  Age / Sex: 80 y.o., female   PCP: Rosalita Chessman, DO Referring Physician: Javier Glazier, MD  Reason for Consultation: [place] Assessment and Plan Summary of Established Goals of Care and Medical Treatment Preferences    Palliative Care Discussion Held Today:   I met today with Ms. & Mr. Boggio along with their children and their spouses. We discussed her overall health with declining respiratory status and the fact that we have optimized her health to the best we can here in the hospital and there are not any other options to improve her status. They are very understanding (especially the children and their spouses). Her main goal is obvious to 1) get home 2) relief from dyspnea. We discussed medications - morphine - to be used for comfort and relief of dyspnea. Also discussed the best resource to help them achieve their goals will be hospice - they are all in agreement and would like to shoot for going home tomorrow.   I also addressed code status briefly and Ms. Crosby tells me she does NOT want intubation but continues to want CPR/shock resuscitation. Expressed my concern that this will not be beneficial and could be harmful for her because her lungs are the problem and if this is not treated CPR will not be of any benefit. Unsure how much she understands and she continues to want resuscitation except for NO intubation. Spoke more for her children alone and they understand this is not beneficial - they are hopeful that this can be discussed more at home with hospice. They want to care for her and keep her comfortable at home until the end. I will follow up tomorrow.    Contacts/Participants in Discussion: Primary Decision Maker: Self and her husband.   Goals of Care/Code Status/Advance Care Planning:   Code Status: Do not intubate - will need more conversation   Symptom  Management:   Dyspnea: Roxanol 2.5 mg every 4 hours prn. May increase for comfort. Monitor carefully in combination with sildenafil and hypotension but necessary to provide comfort. May need to consider d/c sildenafil but patient not ready for this step as of yet.   Constipation: Senokot-S 1 tablet daily - may increase as needed.   Anxiety: Consider prn haldol 1 mg as needed.   Sleep: Continue trazodone 100 mg qhs prn.   Psycho-social/Spiritual:   Support System: Husband and children extremely supportive to take her home and care for her. Children say they can rotate to stay with them to help provide care.   Prognosis:  Days to weeks  Discharge Planning:  Home with hospice       Chief Complaint/HPI: 80 yo female admitted with respiratory distress with PMH PAH with elevated rheumatoid factor/ANA (on sildenafil) but no ambrisentan d/t increased LFTs. Also with legionella pneumonia, ILD flare, pulmonary edema, pseudomonas UTI, diastolic CHF. Her respiratory status is not really improving and she is distressed with poor prognosis.   Primary Diagnoses  Present on Admission:  . Respiratory failure, acute-on-chronic  Palliative Review of Systems:   + dyspnea, + sleep disturbance   I have reviewed the medical record, interviewed the patient and family, and examined the patient. The following aspects are pertinent.  Past Medical History  Diagnosis Date  . Asthma   . Rhinitis   . Osteoarthritis   . Osteoporosis   . Peptic ulcer disease   .  Depression   . Atrial fibrillation     s/p ablation   . Takotsubo cardiomyopathy     resolved  . Shingles   . Eosinophilia   . Sinoatrial node dysfunction   . GERD (gastroesophageal reflux disease)   . Bronchiectasis   . Peptic ulcer disease   . Osteoarthritis   . Herpes zoster   . Hyperlipemia   . Injury of splenic artery     infarct  . Pulmonary hypertension    Social History   Social History  . Marital Status: Married     Spouse Name: N/A  . Number of Children: N/A  . Years of Education: N/A   Occupational History  . retired-- school [employer]    Social History Main Topics  . Smoking status: Never Smoker   . Smokeless tobacco: Never Used  . Alcohol Use: No  . Drug Use: No  . Sexual Activity:    Partners: Male   Other Topics Concern  . None   Social History Narrative   Patient has been at a skilled nursing facility for rehabilitation. Her husband reports no pets currently. She does have remote exposure to parakeets.   Family History  Problem Relation Age of Onset  . Coronary artery disease      femal 1st degree relative  . Liver cancer Son   . Emphysema Sister   . Asthma Mother   . Depression Father     nervous breakdown  . Rheum arthritis Sister    Scheduled Meds: . antiseptic oral rinse  7 mL Mouth Rinse q12n4p  . atorvastatin  10 mg Oral q1800  . budesonide (PULMICORT) nebulizer solution  0.5 mg Nebulization BID  . chlorhexidine  15 mL Mouth Rinse BID  . escitalopram  10 mg Oral Daily  . furosemide  80 mg Intravenous BID  . insulin aspart  0-9 Units Subcutaneous TID WC  . levofloxacin  750 mg Oral Daily  . pantoprazole  40 mg Oral QHS  . polyethylene glycol  17 g Oral Daily  . predniSONE  40 mg Oral Q breakfast  . senna  1 tablet Oral Daily  . sildenafil  20 mg Oral TID  . tiotropium  18 mcg Inhalation Daily  . warfarin  1.25 mg Oral ONCE-1800  . Warfarin - Pharmacist Dosing Inpatient   Does not apply q1800   Continuous Infusions:  PRN Meds:.sodium chloride, albuterol, traZODone Medications Prior to Admission:  Prior to Admission medications   Medication Sig Start Date End Date Taking? Authorizing Provider  albuterol (PROVENTIL) (2.5 MG/3ML) 0.083% nebulizer solution Take 3 mLs by nebulization every 4 (four) hours as needed for wheezing or shortness of breath. 03/08/15  Yes Vassie Loll, MD  Amino Acids-Protein Hydrolys (FEEDING SUPPLEMENT, PRO-STAT SUGAR FREE 64,) LIQD Take 30 mLs  by mouth 2 (two) times daily. 03/08/15  Yes Vassie Loll, MD  Ascorbic Acid (VITAMIN C) 1000 MG tablet Take 1,000 mg by mouth daily.     Yes Historical Provider, MD  beclomethasone (QVAR) 80 MCG/ACT inhaler Inhale 2 puffs into the lungs 2 (two) times daily. 05/08/14  Yes Leslye Peer, MD  BEPREVE 1.5 % SOLN Place 1 drop into both eyes at bedtime.  04/07/13  Yes Historical Provider, MD  budesonide (RHINOCORT AQUA) 32 MCG/ACT nasal spray Place 1 spray into both nostrils daily. 01/23/14  Yes Leslye Peer, MD  calcium carbonate (TUMS - DOSED IN MG ELEMENTAL CALCIUM) 500 MG chewable tablet Chew 1,500 mg by mouth daily.  Yes Historical Provider, MD  Cholecalciferol (VITAMIN D3) 1000 UNITS CAPS Take 1 capsule by mouth daily.     Yes Historical Provider, MD  escitalopram (LEXAPRO) 10 MG tablet TAKE 1 TABLET (10 MG TOTAL) BY MOUTH DAILY. 10/22/14  Yes Yvonne R Lowne, DO  esomeprazole (NEXIUM) 40 MG capsule TAKE ONE CAPSULE BY MOUTH EVERY DAY BEFORE BREAKFAST Patient taking differently: Take 40 mg by mouth daily.  05/22/14  Yes Yvonne R Lowne, DO  fluticasone (FLONASE) 50 MCG/ACT nasal spray Place 2 sprays into both nostrils 2 (two) times daily. 10/17/13  Yes Collene Gobble, MD  furosemide (LASIX) 20 MG tablet Take 1 tablet (20 mg total) by mouth daily. 03/08/15  Yes Barton Dubois, MD  lisinopril (PRINIVIL,ZESTRIL) 2.5 MG tablet Take 1 tablet (2.5 mg total) by mouth daily. 03/08/15  Yes Barton Dubois, MD  loratadine (CLARITIN) 10 MG tablet Take 10 mg by mouth daily.   Yes Historical Provider, MD  Multiple Vitamin (MULTIVITAMIN) tablet Take 1 tablet by mouth daily.     Yes Historical Provider, MD  PATADAY 0.2 % SOLN INSTILL 1 DROP IN BOTH EYES DAILY 12/24/14  Yes Historical Provider, MD  Tadalafil, PAH, 20 MG TABS Take 1 tablet (20 mg total) by mouth daily. 03/08/15  Yes Barton Dubois, MD  tiotropium (SPIRIVA) 18 MCG inhalation capsule Place 1 capsule (18 mcg total) into inhaler and inhale daily. 08/08/14  Yes Collene Gobble, MD  traZODone (DESYREL) 100 MG tablet TAKE 2 TABLETS BY MOUTH AT BEDTIME Patient taking differently: TAKE 1 TABLET BY MOUTH AT BEDTIME 03/17/15  Yes Alferd Apa Lowne, DO  vitamin C (ASCORBIC ACID) 500 MG tablet Take 1,000 mg by mouth daily.   Yes Historical Provider, MD  warfarin (COUMADIN) 2.5 MG tablet Take 1 tablet (2.5 mg total) by mouth daily at 6 PM. 2.5 mg on Tues and Thurs.  1.25 mg all other days. Patient taking differently: Take 2.5 mg by mouth 3 (three) times a week. Tuesday, Thursday, and Saturday 03/09/15  Yes Barton Dubois, MD  warfarin (COUMADIN) 3 MG tablet Take 1.5 mg by mouth 4 (four) times a week. Monday, Wednesday, Friday, and Saturday   Yes Historical Provider, MD  ambrisentan (LETAIRIS) 5 MG tablet Take 1 tablet (5 mg total) by mouth daily. 03/31/15   Collene Gobble, MD  sildenafil (REVATIO) 20 MG tablet Take 1 tablet (20 mg total) by mouth 3 (three) times daily. 03/31/15   Collene Gobble, MD   Allergies  Allergen Reactions  . Benzoyl Peroxide Swelling    Swelling at the site of use  . Neomycin-Bacitracin Zn-Polymyx Swelling    Swelling at the site of use   CBC:    Component Value Date/Time   WBC 15.2* 03/31/2015 0455   WBC 7.9 04/05/2007 1535   HGB 14.1 03/31/2015 0455   HGB 13.2 04/05/2007 1535   HCT 43.0 03/31/2015 0455   HCT 37.2 04/05/2007 1535   PLT 501* 03/31/2015 0455   PLT 292 04/05/2007 1535   MCV 85.3 03/31/2015 0455   MCV 89.6 04/05/2007 1535   NEUTROABS 12.0* 03/27/2015 0224   NEUTROABS 5.6 04/05/2007 1535   LYMPHSABS 0.6* 03/27/2015 0224   LYMPHSABS 1.3 04/05/2007 1535   MONOABS 0.5 03/27/2015 0224   MONOABS 0.6 04/05/2007 1535   EOSABS 0.0 03/27/2015 0224   EOSABS 0.4 04/05/2007 1535   BASOSABS 0.0 03/27/2015 0224   BASOSABS 0.0 04/05/2007 1535   Comprehensive Metabolic Panel:    Component Value Date/Time  NA 131* 04/02/2015 0414   K 4.3 04/02/2015 0414   CL 88* 04/02/2015 0414   CO2 32 04/02/2015 0414   BUN 24* 04/02/2015 0414    CREATININE 0.63 04/02/2015 0414   GLUCOSE 93 04/02/2015 0414   CALCIUM 8.4* 04/02/2015 0414   AST 109* 03/31/2015 0455   ALT 110* 03/31/2015 0455   ALKPHOS 128* 03/31/2015 0455   BILITOT 0.5 03/31/2015 0455   PROT 5.9* 03/31/2015 0455   ALBUMIN 2.4* 03/31/2015 0455    Physical Exam:  Vital Signs: BP 107/65 mmHg  Pulse 86  Temp(Src) 97.8 F (36.6 C) (Oral)  Resp 20  Ht _0  (1.626 m)  Wt 52.527 kg (115 lb 12.8 oz)  BMI 19.87 kg/m2  SpO2 94% SpO2: SpO2: 94 % O2 Device: O2 Device: Nasal Cannula O2 Flow Rate: O2 Flow Rate (L/min): 6 L/min Intake/output summary:  Intake/Output Summary (Last 24 hours) at 04/02/15 1207 Last data filed at 04/02/15 0911  Gross per 24 hour  Intake   1200 ml  Output   1125 ml  Net     75 ml    Last BM Date: 03/30/15 Baseline Weight: Weight: 56.246 kg (124 lb) Most recent weight: Weight: 52.527 kg (115 lb 12.8 oz) (bedscale)  Exam Findings:  General: Respiratory distress at rest, thin, frail HEENT: [place]/AT CVS: RRR Resp: Labored at rest, tachypnea Abd: Soft, ND Neuro: Awake, alert, oriented x 3           Palliative Performance Scale: 30 % at best                Additional Data Reviewed: Recent Labs     03/31/15  0455  04/01/15  0409  04/02/15  0414  WBC  15.2*   --    --   HGB  14.1   --    --   PLT  501*   --    --   NA  131*  131*  131*  BUN  22*  22*  24*  CREATININE  0.55  0.60  0.63     Time In: 1345 Time Out: 1515 Time Total: 19mn  Greater than 50%  of this time was spent counseling and coordinating care related to the above assessment and plan.  Signed by:  AVinie Sill NP Palliative Medicine Team Pager # [phone](M-F 8a-5p) Team Phone # [phone](Nights/Weekends)

## 2015-04-02 NOTE — Progress Notes (Signed)
PULMONARY / CRITICAL CARE MEDICINE   Name: [name] MRN: [id number] DOB: 11-12-1934    ADMISSION DATE:  03/25/2015  REFERRING MD :  ED  CHIEF COMPLAINT:  Acute on Chronic Hypoxic Respiratory Failure  INITIAL PRESENTATION:  80 yo female with respiratory distress and hypoxia >> found to have positive legionella urine antigen.  She has hx of PAH with elevated rheumatoid factor/ANA >> recently started on tadalafil.    PMH of BTX, COPD, CAD, Diastolic CHF, A fib.  Followed by Dr. Delton Coombes as outpt.  STUDIES: 5/04  ECHO >> EF 55 to 60%, PAS 66 mmHg 5/26  Labs >> RNP Ab negative, SCL 70 negative, RF 49.1, ANA positive 5/27  V/Q scan >> low probability for PE 7/12  Labs >> ANA 1:160 homogenous pattern, anti DS DNA negative, RF 78.2 7/13  Lt/Rt heart cath >> RA 1, PV 43/1, PA 52/19 mean 31, PCWP 6, LV 94/1, AO 106/61, CI 2.09, PVR 8 WU, moderate CAD 7/14  CT chest >> patchy GGO, extensive thickening of interstitium, subpleural reticulation, scattered cylindrical BTX, mild air trapping 8/02  Legionella urine antigen positive  SIGNIFICANT EVENTS: 8/02  Admit, BiPAP, cardiology consulted 8/03  Off BiPAP 8/07  To Tele 8/09  Increased WOB, remains on O2, foley in place   SUBJECTIVE:  Remains on 6L O2 - desaturates with any movement.   PT evaluated patient and recommended Palliative Care consult.  Husband with multiple questions regarding plan of care.     VITAL SIGNS: Temp:  [97.6 F (36.4 C)-98.1 F (36.7 C)] 97.8 F (36.6 C) (08/10 0637) Pulse Rate:  [81-92] 86 (08/10 0637) Resp:  [20-22] 20 (08/10 0637) BP: (94-117)/(65-68) 107/65 mmHg (08/10 0637) SpO2:  [91 %-96 %] 94 % (08/10 0836) Weight:  [115 lb 12.8 oz (52.527 kg)] 115 lb 12.8 oz (52.527 kg) (08/10 4098)   INTAKE / OUTPUT:  Intake/Output Summary (Last 24 hours) at 04/02/15 0934 Last data filed at 04/02/15 0911  Gross per 24 hour  Intake   1200 ml  Output   1125 ml  Net     75 ml    PHYSICAL EXAMINATION: General:  pleasant, increased WOB, very weak. Neuro: drowsy, appropriate when awakens, generalized weakness HEENT: No sinus tenderness PULM: mild tachypnea without increased WOB, diminished throughout,  no wheeze CV: regular GI: Soft, non tender Integument: no edema, no rashes   LABS:  CBC  Recent Labs Lab 03/27/15 0224 03/28/15 0215 03/31/15 0455  WBC 13.1* 13.3* 15.2*  HGB 11.9* 12.2 14.1  HCT 35.1* 36.8 43.0  PLT 435* 485* 501*   Coag's  Recent Labs Lab 03/31/15 0455 04/01/15 0409 04/02/15 0414  INR 2.43* 1.58* 1.65*   BMET  Recent Labs Lab 03/31/15 0455 04/01/15 0409 04/02/15 0414  NA 131* 131* 131*  K 3.5 4.1 4.3  CL 91* 89* 88*  CO2 30 32 32  BUN 22* 22* 24*  CREATININE 0.55 0.60 0.63  GLUCOSE 125* 100* 93   Glucose  Recent Labs Lab 03/31/15 2158 04/01/15 0602 04/01/15 1200 04/01/15 1637 04/01/15 2143 04/02/15 0618  GLUCAP 219* 103* 155* 142* 233* 93    Imaging Dg Chest Port 1 View  04/01/2015   CLINICAL DATA:  Followup pulmonary edema superimposed upon chronic lung disease.  EXAM: PORTABLE CHEST - 1 VIEW  COMPARISON:  03/30/2015 and earlier, including high-resolution chest CT 02/24/2015.  FINDINGS: Cardiac silhouette mildly enlarged, unchanged. Left subclavian dual lead transvenous pacemaker unchanged and intact. Further clearing of the patchy airspace  opacities in the lung bases since the examination 2 days ago. Chronic opacity in the left apex, unchanged since the prior CT. No new pulmonary parenchymal abnormalities.  IMPRESSION: Essential resolution of interstitial and airspace pulmonary edema. Stable baseline chronic lung changes. No new/acute cardiopulmonary disease.   Electronically Signed   By: Hulan Saas M.D.   On: 04/01/2015 10:54   MICRO DATA: Legionella Ag 8/02 >> positive Pneumococcal Ag 8/02 >> negative Urine culture 8/02 >> Pseudomonas aeruginosa, pan sensitive Blood 8/02 >> neg   ASSESSMENT / PLAN:  Acute on chronic respiratory  failure - Multifactorial, related to Legionella PNA, ILD flare, and pulmonary edema.  She had CT findings suggestive of NSIP or COP from July 2016.  She has BTX with COPD.  She is slowly improving. Rapid clearing of infiltrates seems to suggest pulmonary edema and scars  Plan: - oxygen to keep SpO2 > 92% - change to prednisone 40 mg daily - taper over 2 weeks - continue spiriva - nebulized pulmicort with prn albuterol  WHO Group 1 Pulmonary Hypertension with positive RA, ANA.  Plan: - continue Sildenafil 20mg   3 times a day as outpatient - Trend LFTs, with elevation, she will not be a candidate for ambrisentan due to LFT's - f/u anti CCP Ab from 8/05 >> no result 8/10 >>   Acute on chronic diastolic heart failure. Hx of A fib, CAD, HTN, HLD.  Plan: - continue lipitor - coumadin per pharmacy - lasix per cardiology  Sepsis from Legionella PNA and Pseudomonas UTI.  Plan: - Complete levaquin x 10 days, D8/10  Constipation. No bm 8/9  Plan: - continue bowel regimen  Hx of PUD.  Plan: - protonix  Hx of depression, insomnia.  Plan: - continue lexapro - trazodone qhs prn  Steroid induced hyperglycemia.  Plan: - SSI  Deconditioning.  Plan: - PT evaluation  Goals of care.  Plan: DNI Discharge - she had a poor experience it [facility] and would rather go home, she has a supportive family and equipment including oxygen. She is very weak and has not been able to tolerate activity.  Husband hopeful to take her home when ready for discharge. Will consult Palliative Care for review of GOC and home assistance.      Canary Brim, NP-C [facility] Pgr: [phone] or if no answer [phone] 04/02/2015, 9:34 AM

## 2015-04-02 NOTE — Clinical Documentation Improvement (Signed)
MD's, NP's, and PA's  Nutritionist evaluation (03/27/15) stating patient meets criteria for "non severe (moderate) malnutrition" .  Please document clinical condition pertaning to nutrition status if appropriate for this admission.  Thank you   Possible Clinical Conditions?  Moderate Malnutrition  Severe Malnutrition    Protein Calorie Malnutrition  Severe Protein Calorie Malnutrition  Other Condition Cannot clinically determine    Treatment: Nutrition evaluation  Thank You, Lavonda Jumbo ,RN Clinical Documentation Specialist:  [phone]  [facility]- Health Information Management

## 2015-04-02 NOTE — Progress Notes (Signed)
The patient's blood pressure is 88/60.  The CCMD on call was notified.  New orders were given to reduce the morphine drip to half the current dosage and to give the pt a 500 mL NS bolus.  The RN carried out the orders and will continue to monitor the patient's BP.

## 2015-04-02 NOTE — Progress Notes (Signed)
Talked to spouse Jonny Ruiz about Home Hospice choice, Jonny Ruiz chose [facility] Orders/ clinical information faxed as requested. Hospital bed delivered to the home on Monday 03/31/2015, patient has home 02 with [facility]. Abelino Derrick RN,BSN,MHA [phone]

## 2015-04-02 NOTE — Progress Notes (Signed)
ANTICOAGULATION CONSULT NOTE - Follow Up Consult  Pharmacy Consult for warfarin Indication: atrial fibrillation  Allergies  Allergen Reactions  . Benzoyl Peroxide Swelling    Swelling at the site of use  . Neomycin-Bacitracin Zn-Polymyx Swelling    Swelling at the site of use    Patient Measurements: Height: 5\' 4"  (162.6 cm) Weight: 115 lb 12.8 oz (52.527 kg) (bedscale) IBW/kg (Calculated) : 54.7  Vital Signs: Temp: 97.8 F (36.6 C) (08/10 0637) Temp Source: Oral (08/10 0637) BP: 107/65 mmHg (08/10 0637) Pulse Rate: 86 (08/10 0637)  Labs:  Recent Labs  03/31/15 0455 04/01/15 0409 04/02/15 0414  HGB 14.1  --   --   HCT 43.0  --   --   PLT 501*  --   --   LABPROT 26.1* 18.9* 19.5*  INR 2.43* 1.58* 1.65*  CREATININE 0.55 0.60 0.63    Estimated Creatinine Clearance: 48.8 mL/min (by C-G formula based on Cr of 0.63).   Assessment: 80yo female with Afib on warfarin PTA.  Warfarin held 8/5 - 8/7 d/t supratherapetuic INR.  INR remains subtherapeutic at 1.65 today after resuming Coumadin 8/8, up slightly from 1.58 yesterday. CBC and PLT stable, no bleeding or issues noted.  Levaquin started 8/3, to completed 10 days of treatment for legionella PNA, Levaquin can increase INR.  PTA warfarin dose: 2.5mg  SunTTh, 1.25mg  all other days ^Per Coumadin Clinic note 6/8  Goal of Therapy:  INR 2-3 Monitor platelets by anticoagulation protocol: Yes  Plan:  - Warfarin 1.25mg  x1 today (aligns w/ home dose) - Monitor daily INR, CBC, s/sx of bleeding   Waynette Buttery, PharmD Clinical Pharmacist Pager: [phone] 04/02/2015 9:57 AM

## 2015-04-02 NOTE — Progress Notes (Signed)
Patient ID: [name], female   DOB: [birth year], 80 y.o.   MRN: [id number]  Subjective:  Remains very short of breath and tachypneic despite diuresis. Sildenafil started without benefit. Has been seen by Palliative Care and now DNI. Sublingual morphine started.   CXR shows resolution of edema.   Scheduled Meds: . antiseptic oral rinse  7 mL Mouth Rinse q12n4p  . atorvastatin  10 mg Oral q1800  . budesonide (PULMICORT) nebulizer solution  0.5 mg Nebulization BID  . chlorhexidine  15 mL Mouth Rinse BID  . escitalopram  10 mg Oral Daily  . feeding supplement (GLUCERNA SHAKE)  237 mL Oral BID BM  . feeding supplement (PRO-STAT SUGAR FREE 64)  30 mL Oral Daily  . furosemide  80 mg Intravenous BID  . insulin aspart  0-9 Units Subcutaneous TID WC  . levofloxacin  750 mg Oral Daily  . pantoprazole  40 mg Oral QHS  . polyethylene glycol  17 g Oral Daily  . predniSONE  40 mg Oral Q breakfast  . [START ON 04/03/2015] senna-docusate  1 tablet Oral QHS  . sildenafil  20 mg Oral TID  . tiotropium  18 mcg Inhalation Daily  . warfarin  1.25 mg Oral ONCE-1800  . Warfarin - Pharmacist Dosing Inpatient   Does not apply q1800   Continuous Infusions:  PRN Meds:.sodium chloride, albuterol, morphine CONCENTRATE, traZODone   Filed Vitals:   04/02/15 0637 04/02/15 0836 04/02/15 1000 04/02/15 1432  BP: 107/65  101/60 99/68  Pulse: 86  68 67  Temp: 97.8 F (36.6 C)   97.5 F (36.4 C)  TempSrc: Oral   Oral  Resp: 20   20  Height:      Weight: 52.527 kg (115 lb 12.8 oz)     SpO2: 91% 94% 94% 94%    Intake/Output Summary (Last 24 hours) at 04/02/15 1751 Last data filed at 04/02/15 1600  Gross per 24 hour  Intake   1060 ml  Output    925 ml  Net    135 ml    LABS: Basic Metabolic Panel:  Recent Labs  40/98/11 0455 04/01/15 0409 04/02/15 0414  NA 131* 131* 131*  K 3.5 4.1 4.3  CL 91* 89* 88*  CO2 30 32 32  GLUCOSE 125* 100* 93  BUN 22* 22* 24*  CREATININE 0.55 0.60 0.63  CALCIUM  8.1* 8.2* 8.4*  MG 1.5* 2.1  --   PHOS  --  2.9  --    Liver Function Tests:  Recent Labs  03/31/15 0455  AST 109*  ALT 110*  ALKPHOS 128*  BILITOT 0.5  PROT 5.9*  ALBUMIN 2.4*   No results for input(s): LIPASE, AMYLASE in the last 72 hours. CBC:  Recent Labs  03/31/15 0455  WBC 15.2*  HGB 14.1  HCT 43.0  MCV 85.3  PLT 501*   Cardiac Enzymes: No results for input(s): CKTOTAL, CKMB, CKMBINDEX, TROPONINI in the last 72 hours. BNP: Invalid input(s): POCBNP D-Dimer: No results for input(s): DDIMER in the last 72 hours. Hemoglobin A1C: No results for input(s): HGBA1C in the last 72 hours. Fasting Lipid Panel: No results for input(s): CHOL, HDL, LDLCALC, TRIG, CHOLHDL, LDLDIRECT in the last 72 hours. Thyroid Function Tests: No results for input(s): TSH, T4TOTAL, T3FREE, THYROIDAB in the last 72 hours.  Invalid input(s): FREET3 Anemia Panel: No results for input(s): VITAMINB12, FOLATE, FERRITIN, TIBC, IRON, RETICCTPCT in the last 72 hours.  RADIOLOGY: Ct Chest High Resolution  03/06/2015  CLINICAL DATA:  80 year old female with increasing shortness of breath over the past year. Clinical concern for interstitial lung disease. History of asthma. Currently on home oxygen.  EXAM: CT CHEST WITHOUT CONTRAST  TECHNIQUE: Multidetector CT imaging of the chest was performed following the standard protocol without intravenous contrast. High resolution imaging of the lungs, as well as inspiratory and expiratory imaging, was performed.  COMPARISON:  Chest CT 11/13/2013.  FINDINGS: Mediastinum/Lymph Nodes: Heart size is mildly enlarged. There is no significant pericardial fluid, thickening or pericardial calcification. There is atherosclerosis of the thoracic aorta, the great vessels of the mediastinum and the coronary arteries, including calcified atherosclerotic plaque in the left main, left anterior descending, left circumflex and right coronary arteries. Mild calcifications of the  aortic valve. Left-sided pacemaker device in position with lead tips terminating in the right atrium and the right ventricle near the apex. Multiple prominent borderline enlarged mediastinal and hilar lymph nodes are noted. Esophagus is unremarkable in appearance. No axillary lymphadenopathy.  Lungs/Pleura: High-resolution images demonstrate extensive patchy areas of ground-glass attenuation asymmetrically distributed in the lungs bilaterally (left greater than right), typically in a peribronchovascular distribution. This is associated with extensive thickening of the adjacent peribronchovascular interstitium, as well as septal thickening and some subpleural reticulation. In addition, there is a more peripherally located area of consolidation and airspace disease in the left upper lobe which is somewhat wedge-shaped. Scattered areas of mild cylindrical bronchiectasis are noted throughout these same regions. No frank honeycombing is confidently identified at this time. No clearly definable craniocaudal gradient, although there is more basilar involvement at this time. No large suspicious appearing pulmonary nodules or masses. Inspiratory and expiratory imaging demonstrates some mild air trapping, indicative of mild small airways disease. No pleural effusions.  Upper Abdomen: Mild diffuse decreased attenuation throughout the hepatic parenchyma, indicative of mild hepatic steatosis. Atherosclerosis.  Musculoskeletal/Soft Tissues: There are no aggressive appearing lytic or blastic lesions noted in the visualized portions of the skeleton.  IMPRESSION: 1. Unusual appearance of the lung parenchyma, as discussed above. These findings could certainly reflect an underlying interstitial lung disease, but the pattern is nonspecific. At this time, findings are favored to reflect nonspecific interstitial pneumonia (NSIP), or potentially cryptogenic organizing pneumonia (COP). No definite evidence to suggest more aggressive  process such as usual interstitial pneumonia (UIP) at this time. 2. The possibility of concurrent or residual infectious airspace consolidation is not excluded, particularly in the periphery of the left upper lobe. Clinical correlation for signs and symptoms of pneumonia is recommended. 3. For further evaluation of these findings, a repeat high-resolution chest CT is suggested in 6-12 months to assess for temporal changes in the appearance of the lung parenchyma if clinically appropriate. 4. Atherosclerosis, including left main and 3 vessel coronary artery disease. Assessment for potential risk factor modification, dietary therapy or pharmacologic therapy may be warranted, if clinically indicated. 5. Mild hepatic steatosis. 6. Additional incidental findings, as above.   Electronically Signed   By: Trudie Reed M.D.   On: 03/06/2015 14:11   Dg Chest Port 1 View  04/01/2015   CLINICAL DATA:  Followup pulmonary edema superimposed upon chronic lung disease.  EXAM: PORTABLE CHEST - 1 VIEW  COMPARISON:  03/30/2015 and earlier, including high-resolution chest CT 02/24/2015.  FINDINGS: Cardiac silhouette mildly enlarged, unchanged. Left subclavian dual lead transvenous pacemaker unchanged and intact. Further clearing of the patchy airspace opacities in the lung bases since the examination 2 days ago. Chronic opacity in the left apex, unchanged since the  prior CT. No new pulmonary parenchymal abnormalities.  IMPRESSION: Essential resolution of interstitial and airspace pulmonary edema. Stable baseline chronic lung changes. No new/acute cardiopulmonary disease.   Electronically Signed   By: Hulan Saas M.D.   On: 04/01/2015 10:54   Dg Chest Port 1 View  03/30/2015   CLINICAL DATA:  Congestive heart/ respiratory failure  EXAM: PORTABLE CHEST - 1 VIEW  COMPARISON:  03/28/2015 and 03/27/2015.  FINDINGS: 0459 hr. The left subclavian pacemaker leads are unchanged within the right atrium and right ventricle. There has  been further interval clearing of the bilateral airspace opacities. No pneumothorax or significant pleural effusion identified. The heart size and mediastinal contours are stable.  IMPRESSION: Further clearing of bilateral airspace opacities most consistent with resolving edema.   Electronically Signed   By: Carey Bullocks M.D.   On: 03/30/2015 07:02   Dg Chest Port 1 View  03/28/2015   CLINICAL DATA:  Respiratory failure.  EXAM: PORTABLE CHEST - 1 VIEW  COMPARISON:  03/27/2015.  FINDINGS: Mediastinum hilar structures normal. Cardiac pacer with lead tips in right atrium and right ventricle. Cardiomegaly. Interim partial clearing of bilateral airspace disease. Small left pleural effusion. No pneumothorax.  IMPRESSION: Interim partial clearing of bilateral airspace disease .   Electronically Signed   By: Maisie Fus  Register   On: 03/28/2015 07:20   Dg Chest Port 1 View  03/27/2015   CLINICAL DATA:  Pulmonary edema.  EXAM: PORTABLE CHEST - 1 VIEW  COMPARISON:  03/25/2015.  FINDINGS: Mediastinum hilar structures normal. Cardiac pacer noted in stable position. Persistent bilateral unchanged airspace disease consistent with bilateral pulmonary edema and/or pneumonia. Small left pleural effusion. No pneumothorax.  IMPRESSION: 1. Persistent bilateral diffuse airspace disease consistent with pulmonary edema and/or pneumonia. Small left pleural effusion. 2. Stable cardiac pacer position.Heart size stable.   Electronically Signed   By: Maisie Fus  Register   On: 03/27/2015 06:56   Dg Chest Portable 1 View  03/25/2015   CLINICAL DATA:  Sudden onset of respiratory distress  EXAM: PORTABLE CHEST - 1 VIEW  COMPARISON:  02/28/2015  FINDINGS: Cardiomediastinal silhouette is stable. There is central mild vascular congestion. Worsening interstitial prominence bilaterally. Findings highly suspicious for pulmonary edema or bilateral pneumonitis. Clinical correlation is necessary. Stable dual lead cardiac pacemaker position.  IMPRESSION:  There is central mild vascular congestion. Worsening interstitial prominence bilaterally. Findings highly suspicious for pulmonary edema or bilateral pneumonitis. Clinical correlation is necessary.   Electronically Signed   By: Natasha Mead M.D.   On: 03/25/2015 16:13    PHYSICAL EXAM General: frail tachypneic Neck: JVP 15 cm, no thyromegaly or thyroid nodule.  Lungs: +tachynpneic with limited air movement CV: Nondisplaced PMI.  Heart irregular S1/S2, no S3/S4, no murmur.  No peripheral edema.   Abdomen: Soft, nontender, no hepatosplenomegaly, no distention.  Neurologic: Alert and oriented x 3.  Psych: anxious Extremities: No clubbing or cyanosis.   TELEMETRY: Reviewed telemetry pt in atrial fibrillation, controlled rate  ASSESSMENT AND PLAN: 80 yo with history of permanent atrial fibrillation, sinus node dysfunction s/p MDT PPM, Takotsubo CMP (resolved, 9/07), idiopathic eosinophilia, bronchiectasis with mild obstructive PFTs, CAD with moderate LAD and RCA disease by cath in 2012, diastolic CHF and PAH was admitted with recurrent acute hypoxemic respiratory failure. She has been slowly worsening over the last few months. She has been on 4 L home oxygen.  1. Acute on chronic hypoxemic respiratory failure: 2. Acute on chronic diastolic CHF: EF 06-77% on 5/16 echo with PASP 66  mmHg.Now diuresed  3. Pulmonary hypertension: Significant PAH with marginal cardiac outputd 4. CAD: Moderate CAD on coronary angiography on 7/13.  5. Atrial fibrillation: Permanent it appears at this point.  She is clearly end-stage. Degree of respiratory compromise seems far out of proportion to degree of PAH or CXR. Agree with a Palliative approach. I will start a morphine gtt. She and her family agree to this.   Arvilla Meres MD  04/02/2015 5:51 PM

## 2015-04-03 ENCOUNTER — Encounter: Payer: Self-pay | Admitting: *Deleted

## 2015-04-03 ENCOUNTER — Telehealth: Payer: Self-pay | Admitting: Emergency Medicine

## 2015-04-03 DIAGNOSIS — J9620 Acute and chronic respiratory failure, unspecified whether with hypoxia or hypercapnia: Secondary | ICD-10-CM

## 2015-04-03 LAB — GLUCOSE, CAPILLARY
GLUCOSE-CAPILLARY: 91 mg/dL (ref 65–99)
Glucose-Capillary: 123 mg/dL — ABNORMAL HIGH (ref 65–99)
Glucose-Capillary: 186 mg/dL — ABNORMAL HIGH (ref 65–99)

## 2015-04-03 LAB — BASIC METABOLIC PANEL
ANION GAP: 10 (ref 5–15)
BUN: 22 mg/dL — AB (ref 6–20)
CHLORIDE: 92 mmol/L — AB (ref 101–111)
CO2: 31 mmol/L (ref 22–32)
CREATININE: 0.55 mg/dL (ref 0.44–1.00)
Calcium: 8 mg/dL — ABNORMAL LOW (ref 8.9–10.3)
GFR calc non Af Amer: >60 mL/min (ref >60)
Glucose, Bld: 94 mg/dL (ref 65–99)
Potassium: 4 mmol/L (ref 3.5–5.1)
Sodium: 133 mmol/L — ABNORMAL LOW (ref 135–145)

## 2015-04-03 LAB — CBC
HEMATOCRIT: 44.5 % (ref 36.0–46.0)
Hemoglobin: 14.6 g/dL (ref 12.0–15.0)
MCH: 28.3 pg (ref 26.0–34.0)
MCHC: 32.8 g/dL (ref 30.0–36.0)
MCV: 86.2 fL (ref 78.0–100.0)
PLATELETS: 462 10*3/uL — AB (ref 150–400)
RBC: 5.16 MIL/uL — ABNORMAL HIGH (ref 3.87–5.11)
RDW: 15.8 % — AB (ref 11.5–15.5)
WBC: 18 10*3/uL — AB (ref 4.0–10.5)

## 2015-04-03 LAB — PROTIME-INR
INR: 2.4 — AB (ref 0.00–1.49)
Prothrombin Time: 25.9 seconds — ABNORMAL HIGH (ref 11.6–15.2)

## 2015-04-03 MED ORDER — POLYETHYLENE GLYCOL 3350 17 G PO PACK: 17.0000 g | PACK | Freq: Every day | ORAL | Status: AC

## 2015-04-03 MED ORDER — SILDENAFIL CITRATE 20 MG PO TABS: 20.0000 mg | ORAL_TABLET | Freq: Three times a day (TID) | ORAL | Status: DC

## 2015-04-03 MED ORDER — PREDNISONE 20 MG PO TABS: 40.0000 mg | ORAL_TABLET | Freq: Every day | ORAL | Status: AC

## 2015-04-03 MED ORDER — MORPHINE SULFATE (CONCENTRATE) 10 MG/0.5ML PO SOLN: 2.5000 mg | ORAL | Status: AC | PRN

## 2015-04-03 MED ORDER — ALBUTEROL SULFATE (2.5 MG/3ML) 0.083% IN NEBU: 3.0000 mL | INHALATION_SOLUTION | RESPIRATORY_TRACT | Status: AC | PRN

## 2015-04-03 MED ORDER — IPRATROPIUM-ALBUTEROL 0.5-2.5 (3) MG/3ML IN SOLN: 3.0000 mL | Freq: Four times a day (QID) | RESPIRATORY_TRACT | Status: AC

## 2015-04-03 MED ORDER — SODIUM CHLORIDE 0.9 % IV BOLUS (SEPSIS)
500.0000 mL | Freq: Once | INTRAVENOUS | Status: AC
  Administered 2015-04-03: 500 mL via INTRAVENOUS

## 2015-04-03 NOTE — Plan of Care (Signed)
Problem: Discharge Progression Outcomes Goal: Independent ADLs or Home Health Care Outcome: Completed/Met Date Met:  04/03/15 Assist as needed

## 2015-04-03 NOTE — Telephone Encounter (Signed)
Spoke with Leotis Shames at [employer], wants to know if we've received a PA request for pt's Sildenafil.  I advised that I did not see it, and it is being re-faxed to our verified fax #.  Will await fax.

## 2015-04-03 NOTE — Telephone Encounter (Signed)
Called and EXT not valid. Was given option to leave message. Will await call

## 2015-04-03 NOTE — Progress Notes (Signed)
MS04 wasted in sink by Amanda Pea, RN, witnessed by Zollie Beckers, RN.  Pt d/c to [employer], Charity fundraiser.

## 2015-04-03 NOTE — Patient Outreach (Signed)
[facility]) Care Management  04/03/2015  [name] 25-Feb-1935 [id number]   CSW received a message from [facility] Liaison with [facility] Care Management, indicating that patient will be discharging from [facility], electing Hospice services in the home.  CSW will sign off on patient's case, as patient will received social work services through [facility] [place].  [name], [name], [name], [name] [facility] [address] [place], [address] [place], [place] [address] [name] [phone]

## 2015-04-03 NOTE — Telephone Encounter (Signed)
Received fax for PA for Sildenafil 20mg .  Pa initiated through cover my meds. Pending review Key: [id number] Will await response.

## 2015-04-03 NOTE — Care Management Important Message (Signed)
Important Message  Patient Details  Name: [name] MRN: [id number] Date of Birth: Dec 27, 1934   Medicare Important Message Given:  Yes-fourth notification given    Yvonna Alanis 04/03/2015, 2:04 PM

## 2015-04-03 NOTE — Progress Notes (Signed)
eLink Physician-Brief Progress Note Patient Name: [name] DOB: 11-22-1934 MRN: [id number]   Date of Service  04/03/2015  HPI/Events of Note  RN paging eMD  - patient in morphine dgtt for dyspnea relief. She is DNI but YES for CPR per RN  - now hypotensive  eICU Interventions  Fluid bolus IF still hypotensive bedside APP/MD to address goals     Intervention Category Major Interventions: Hypotension - evaluation and management  Amoni Scallan 04/03/2015, 2:26 AM

## 2015-04-03 NOTE — Progress Notes (Signed)
Daily Progress Note   Patient Name: [name]       Date: 04/03/2015 DOB: 10/17/1934  Age: 80 y.o. MRN#: [id number] Attending Physician: Javier Glazier, MD Primary Care Physician: Garnet Koyanagi, DO Admit Date: 03/25/2015  Reason for Consultation/Follow-up: GOC  Subjective:     I met today with [name] and her husband and son. Discussed plan for home today and the plan to d/c IV morphine, give a dose of roxanol, and then have hospice assist with continuing subcutaneous infusion of morphine at home. We also further discussed code status and GOC. We completed MOST form: DNR, comfort measures with no return to the hospital, determine use/benefit of antibiotics when infection occurs, no IVF, no feeding tube. We also discussed that it seems she is having greater benefit from morphine instead of sidenafil as I am concerned this will drop her BP too much and cause other problems she does not need. Patient and family agree this would be best - I am sure having Dr. Agustina Caroli reassurance would also make them feel good about this (spoke with him and Brandi-NP and they agree).    Length of Stay: 9 days  Current Medications: Scheduled Meds:  . antiseptic oral rinse  7 mL Mouth Rinse q12n4p  . atorvastatin  10 mg Oral q1800  . budesonide (PULMICORT) nebulizer solution  0.5 mg Nebulization BID  . chlorhexidine  15 mL Mouth Rinse BID  . escitalopram  10 mg Oral Daily  . feeding supplement (GLUCERNA SHAKE)  237 mL Oral BID BM  . feeding supplement (PRO-STAT SUGAR FREE 64)  30 mL Oral Daily  . furosemide  80 mg Intravenous BID  . insulin aspart  0-9 Units Subcutaneous TID WC  . levofloxacin  750 mg Oral Daily  . pantoprazole  40 mg Oral QHS  . polyethylene glycol  17 g Oral Daily  . predniSONE  40 mg Oral Q breakfast  . senna-docusate  1 tablet Oral QHS  . sildenafil  20 mg Oral TID  . tiotropium  18 mcg Inhalation Daily  . Warfarin - Pharmacist Dosing Inpatient   Does not apply q1800     Continuous Infusions: . morphine 1 mg/hr (04/02/15 2343)    PRN Meds: sodium chloride, albuterol, morphine injection, morphine CONCENTRATE, traZODone  Palliative Performance Scale: 30 %     Vital Signs: BP 109/64 mmHg  Pulse 64  Temp(Src) 97.5 F (36.4 C) (Oral)  Resp 18  Ht _0  (1.626 m)  Wt 54.023 kg (119 lb 1.6 oz)  BMI 20.43 kg/m2  SpO2 93% SpO2: SpO2: 93 % O2 Device: O2 Device: Nasal Cannula O2 Flow Rate: O2 Flow Rate (L/min): 3 L/min  Intake/output summary:  Intake/Output Summary (Last 24 hours) at 04/03/15 0920 Last data filed at 04/03/15 1638  Gross per 24 hour  Intake 112.94 ml  Output   1250 ml  Net -1137.06 ml   LBM: Last BM Date: 04/02/15 Baseline Weight: Weight: 56.246 kg (124 lb) Most recent weight: Weight: 54.023 kg (119 lb 1.6 oz) (bedscale)  Physical Exam: General: NAD, thin, frail HEENT: Cable/AT CVS: RRR Resp: Breathing much more comfortable and able to speak with more ease Abd: Soft, ND Neuro: Awake, alert, oriented x 3, seems a little confused at times    Additional Data Reviewed: Recent Labs     04/02/15  0414  04/03/15  0446  WBC   --   18.0*  HGB   --   14.6  PLT   --  462*  NA  131*  133*  BUN  24*  22*  CREATININE  0.63  0.55     Problem List:  Patient Active Problem List   Diagnosis Date Noted  . Palliative care encounter 04/02/2015  . Pressure ulcer 03/26/2015  . Pulmonary edema   . Respiratory failure, acute-on-chronic 03/25/2015  . Acute on chronic respiratory failure with hypoxia   . Physical deconditioning   . Urinary retention   . Dyspnea   . Pulmonary arterial hypertension 03/03/2015  . Atrial fibrillation with slow ventricular response 03/03/2015  . Coronary artery calcification seen on CT scan 03/03/2015  . Malnutrition of moderate degree 03/02/2015  . Protein-calorie malnutrition, severe 03/02/2015  . Cellulitis 03/01/2015  . Cellulitis of left lower extremity   . Congestive heart disease   .  Acute on chronic diastolic congestive heart failure, NYHA class 2 02/28/2015  . Acute on chronic respiratory failure 02/28/2015  . Pulmonary hypertension 01/16/2015  . Hypoxemia 11/12/2013  . Encounter for therapeutic drug monitoring 09/19/2013  . Unspecified adverse effect of unspecified drug, medicinal and biological substance 12/21/2011  . Long term (current) use of anticoagulants 07/16/2011  . Splenic infarct 07/12/2011  . Atrial fibrillation-persistent 07/12/2011  . Insomnia 06/21/2011  . HYPERLIPIDEMIA 11/04/2010  . HIP PAIN, LEFT 11/04/2010  . RHINITIS 01/23/2010  . Sinoatrial node dysfunction 10/21/2009  . WEIGHT GAIN 10/21/2009  . Dyspnea on exertion 10/21/2009  . PACEMAKER, [employer]--programmed VVIR 10/21/2009  . UTI 06/07/2008  . HERPES ZOSTER, HX OF 04/26/2008  . SHINGLES 04/15/2008  . EOSINOPHILIA 07/06/2007  . CARDIOMYOPATHY 07/06/2007  . BRONCHIECTASIS 07/06/2007  . GERD 07/06/2007  . ANA POSITIVE 03/28/2007  . DEPRESSION 10/31/2006  . Asthma 10/31/2006  . PEPTIC ULCER DISEASE 10/31/2006  . OSTEOARTHRITIS 10/31/2006  . OSTEOPOROSIS 10/31/2006     Palliative Care Assessment & Plan    Code Status:  DNR  Goals of Care:  Her goals are to 1) return home and stay at home 2) manage her breathing to keep it more comfortable  3. Symptom Management:  Dyspnea: Continue morphine infusion 1 mg/hr to manage shortness of breath. May also have 1 mg bolus every hour prn. The frequency/dose may be increased as needed for comfort.   Constipation: Senokot-S 1 tablet daily - may increase as needed.   Anxiety: Consider prn haldol 1 mg as needed.   Sleep: Continue trazodone 100 mg qhs prn.    5. Prognosis: Days to weeks  5. Discharge Planning: Home with hospice   Thank you for allowing the Palliative Medicine Team to assist in the care of this patient.   Time In: 1040 Time Out: 1200 Total Time 67mn Prolonged Time Billed  yes     Greater than 50%  of this  time was spent counseling and coordinating care related to the above assessment and plan.     AVinie Sill NP Palliative Medicine Team Pager # [phone](M-F 8a-5p) Team Phone # [phone](Nights/Weekends)  04/03/2015, 9:20 AM

## 2015-04-03 NOTE — Progress Notes (Signed)
At 1515 introduced self to pt and spouse at the bedside.  Call bell at reach.  Instructed to call for assistance.  Verbalized understanding. Also   Pt d/c home at 1751 via ambulance.  Amanda Pea, Charity fundraiser.

## 2015-04-03 NOTE — Discharge Summary (Signed)
Physician Discharge Summary  Patient ID: [name] MRN: [id number] DOB/AGE: 1935/03/19 80 y.o.  Admit date: 03/25/2015 Discharge date: 04/03/2015    Discharge Diagnoses:  Active Problems:   Respiratory failure, acute-on-chronic   Pressure ulcer   Pulmonary edema   Palliative care encounter                                                                     DISCHARGE PLAN   Acute on chronic respiratory failure  WHO Group 1 Pulmonary Hypertension with positive RA, ANA. Acute on chronic diastolic heart failure. Hx of A fib, CAD, HTN, HLD. Sepsis from Legionella PNA and Pseudomonas UTI - resolved.  Steroid induced hyperglycemia.  PLAN -  DNR/DNI Home with Hospice  Low dose morphine gtt and titrate as needed for comfort/dypnea- sub-q per home hospice PRN roxanol until morphine gtt initiated at home  Continue prednisone 40mg  daily as long as she is able to take PO's  Continue revatio  D/c abx - s/p levaquin x 9 days  Duonebs, PRN albuterol  D/c coumadin, trazadone, lexapro, SSI  PRN bowel regimen  Supplemental O2 as needed for comfort    Brief Summary: [name] is a 80 y.o. y/o female with hx AFib, GERD, bronchiectasis and a recent dx of pulmonary hypertension started on tadalafil during last admission (just d/c 7/16).  Returned 8/2 with increased SOB, cough, fever.  She was admitted and started on bipap. She was diuresed, continued on tadalafil, treated with steroids and treated with abx for PNA and UTI.  Her urine legionella was pos, ?if this was truly a pathogen but she was unable to expectorate a sputum sample for sputum legionella culture. She was followed closely by cardiology and improved somewhat from a respiratory standpoint.  She came off bipap and was transitioned to med-surg but continued to have difficulties with symptom management and progression of dyspnea and overall decline.  Pt and her family met with palliative care on 8/10 and ultimate goal was to get the  patient home with hospice and ongoing symptom management.  Overnight 8/11 pt had increased dyspnea and low dose morphine gtt initiated with much improvement.  Hospice care with home morphine gtt arranged and pt discharged to home.    STUDIES: 5/04 ECHO >> EF 55 to 60%, PAS 66 mmHg 5/26 Labs >> RNP Ab negative, SCL 70 negative, RF 49.1, ANA positive 5/27 V/Q scan >> low probability for PE 7/12 Labs >> ANA 1:160 homogenous pattern, anti DS DNA negative, RF 78.2 7/13 Lt/Rt heart cath >> RA 1, PV 43/1, PA 52/19 mean 31, PCWP 6, LV 94/1, AO 106/61, CI 2.09, PVR 8 WU, moderate CAD 7/14 CT chest >> patchy GGO, extensive thickening of interstitium, subpleural reticulation, scattered cylindrical BTX, mild air trapping 8/02 Legionella urine antigen positive   SIGNIFICANT EVENTS: 8/02 Admit, BiPAP, cardiology consulted 8/03 Off BiPAP 8/07 To Tele 8/09 Increased WOB, remains on O2, foley in place 8/10 met with palliative care - goal is home and dyspnea control  8/11 on low dose morphine gtt for increased dyspnea.     Filed Vitals:   04/02/15 2226 04/03/15 0157 04/03/15 0324 04/03/15 0616  BP: 88/60 84/51 99/61  109/64  Pulse: 68  59 64  Temp: 98.1  F (36.7 C)   97.5 F (36.4 C)  TempSrc: Oral   Oral  Resp: $Remo'20 18  18  'fvvSf$ Height:      Weight:    119 lb 1.6 oz (54.023 kg)  SpO2: 94% 94%  93%     Discharge Labs  BMET  Recent Labs Lab 03/29/15 0234 03/30/15 0250 03/31/15 0455 04/01/15 0409 04/02/15 0414 04/03/15 0446  NA 131*  133* 132* 131* 131* 131* 133*  K 3.9  4.0 3.9 3.5 4.1 4.3 4.0  CL 93*  93* 91* 91* 89* 88* 92*  CO2 30  31 32 30 32 32 31  GLUCOSE 126*  129* 135* 125* 100* 93 94  BUN $Re'19  19 19 'ruQ$ 22* 22* 24* 22*  CREATININE 0.55  0.56 0.62 0.55 0.60 0.63 0.55  CALCIUM 7.9*  7.9* 8.1* 8.1* 8.2* 8.4* 8.0*  MG  --   --  1.5* 2.1  --   --   PHOS 2.0*  --   --  2.9  --   --      CBC   Recent Labs Lab 03/28/15 0215 03/31/15 0455 04/03/15 0446  HGB  12.2 14.1 14.6  HCT 36.8 43.0 44.5  WBC 13.3* 15.2* 18.0*  PLT 485* 501* 462*   Anti-Coagulation  Recent Labs Lab 03/30/15 0250 03/31/15 0455 04/01/15 0409 04/02/15 0414 04/03/15 0446  INR 3.38* 2.43* 1.58* 1.65* 2.40*      Discharge Instructions    Activity as tolerated - No restrictions    Complete by:  As directed      Call MD for:  difficulty breathing, headache or visual disturbances    Complete by:  As directed      Call MD for:  persistant nausea and vomiting    Complete by:  As directed      Call MD for:  severe uncontrolled pain    Complete by:  As directed      Diet general    Complete by:  As directed   Diet as tolerated.     Discharge instructions    Complete by:  As directed   Review your medications carefully as they have changed.   Palliative Care & Hospice will be visiting you at home.                Follow-up Information    Follow up with [facility].   Specialty:  Home Health Services   Why:  They will provide your home health care at your home   Contact information:   [address] [place] [address] [phone]          Medication List    STOP taking these medications        beclomethasone 80 MCG/ACT inhaler  Commonly known as:  QVAR     budesonide 32 MCG/ACT nasal spray  Commonly known as:  RHINOCORT AQUA     calcium carbonate 500 MG chewable tablet  Commonly known as:  TUMS - dosed in mg elemental calcium     escitalopram 10 MG tablet  Commonly known as:  LEXAPRO     esomeprazole 40 MG capsule  Commonly known as:  NEXIUM     feeding supplement (PRO-STAT SUGAR FREE 64) Liqd     fluticasone 50 MCG/ACT nasal spray  Commonly known as:  FLONASE     furosemide 20 MG tablet  Commonly known as:  LASIX     lisinopril 2.5 MG tablet  Commonly known as:  PRINIVIL,ZESTRIL  loratadine 10 MG tablet  Commonly known as:  CLARITIN     multivitamin tablet     Tadalafil (PAH) 20 MG Tabs     tiotropium  18 MCG inhalation capsule  Commonly known as:  SPIRIVA     traZODone 100 MG tablet  Commonly known as:  DESYREL     vitamin C 1000 MG tablet     vitamin C 500 MG tablet  Commonly known as:  ASCORBIC ACID     Vitamin D3 1000 UNITS Caps     warfarin 2.5 MG tablet  Commonly known as:  COUMADIN     warfarin 3 MG tablet  Commonly known as:  COUMADIN      TAKE these medications        albuterol (2.5 MG/3ML) 0.083% nebulizer solution  Commonly known as:  PROVENTIL  Take 3 mLs by nebulization every 4 (four) hours as needed for wheezing or shortness of breath.     BEPREVE 1.5 % Soln  Generic drug:  Bepotastine Besilate  Place 1 drop into both eyes at bedtime.     ipratropium-albuterol 0.5-2.5 (3) MG/3ML Soln  Commonly known as:  DUONEB  Take 3 mLs by nebulization every 6 (six) hours.     morphine CONCENTRATE 10 MG/0.5ML Soln concentrated solution  Take 0.13 mLs (2.6 mg total) by mouth every 2 (two) hours as needed for moderate pain, anxiety or shortness of breath (dyspnea).     PATADAY 0.2 % Soln  Generic drug:  Olopatadine HCl  INSTILL 1 DROP IN BOTH EYES DAILY     polyethylene glycol packet  Commonly known as:  MIRALAX / GLYCOLAX  Take 17 g by mouth daily.     predniSONE 20 MG tablet  Commonly known as:  DELTASONE  Take 2 tablets (40 mg total) by mouth daily with breakfast.     sildenafil 20 MG tablet  Commonly known as:  REVATIO  Take 1 tablet (20 mg total) by mouth 3 (three) times daily.     sildenafil 20 MG tablet  Commonly known as:  REVATIO  Take 1 tablet (20 mg total) by mouth 3 (three) times daily.          Disposition: Home with hospice   Discharged Condition: [name] has met maximum benefit of inpatient care and is medically stable and cleared for discharge.  Patient is pending follow up as above.      Time spent on disposition:  Greater than 35 minutes.   SignedDarlina Sicilian, NP 04/03/2015  9:46 AM Pager: [phone] or [phone]

## 2015-04-03 NOTE — Consult Note (Signed)
   [facility] CM Inpatient Consult   04/03/2015  [name] 29-Nov-1934 [id number] Follow up from progression meeting: Patient's current disposition is to discharge home with hospice.  Met with husband at bedside and he states he appreciated services rendered by [facility].  States that they are aware that hospice services with take over managing her disposition needs.  The family had questions regarding equipment and this Probation officer deferred him to the inpatient RNCM and RNCM messaged the inpatient RNCM, Hassan Rowan.  [facility] Care Management will sign off at this point and will notify involved [facility] staff members.  For questions, please contact: Natividad Brood, RN BSN [facility] Liaison  [phone] business mobile phone

## 2015-04-03 NOTE — Progress Notes (Signed)
PT Cancellation Note  Patient Details Name: [name] MRN: [id number] DOB: Oct 18, 1934   Cancelled Treatment:    Reason Eval/Treat Not Completed: Other (comment); patient meeting with palliative nurse and prepping for d/c today.  Will cancel for today.   WYNN,CYNDI 04/03/2015, 12:43 PM  [name], PT [phone] 04/03/2015

## 2015-04-03 NOTE — Progress Notes (Signed)
The patient's BP was 84/51.  The CCMD was notified.  New orders were written to give the patient an additional 500 mL NS bolus.  After giving the bolus, the patient's BP came up to 99/61.  The patient was easily awoken and pleasant at this time.  The RN will continue to monitor the patient's BP.

## 2015-04-04 ENCOUNTER — Telehealth: Payer: Self-pay | Admitting: Emergency Medicine

## 2015-04-04 NOTE — Telephone Encounter (Signed)
[facility]. Per RB, pt has been moved to Hospice care and they decided against these medications. They will not be shipped to the pt per [place] at [facility]. Nothing further was needed.

## 2015-04-04 NOTE — Telephone Encounter (Signed)
Received call from [place] at [facility].  She was under the impression that patient is under the care of [facility] and wanted confirmation.   [name] that we do not have anything in our records showing that she is with [facility], all our records show is that patient is under Hospice care. Tamela Oddi says she will contact family to verify and will let us know so we can update our records.

## 2015-04-07 ENCOUNTER — Telehealth: Payer: Self-pay | Admitting: Emergency Medicine

## 2015-04-07 NOTE — Telephone Encounter (Signed)
Lauren recd Michelle's VM and states there is no need to call back.  You can call back if something else is needed but she does not recall a call back.

## 2015-04-07 NOTE — Telephone Encounter (Signed)
Spoke with Lauren from [facility] to know if patient is going to be going on Sildenafil or Adcirca?  Per notes from Hospice - they do not want either medication Received response back on PA for Sildenafil and it has been approved until 2018  Dr. Delton Coombes, please advise.

## 2015-04-07 NOTE — Telephone Encounter (Signed)
  Left detailed message for [name] advising her that we are not using Sildenafil or Adcirca. Requested she call back to confirm receipt of message.

## 2015-04-07 NOTE — Telephone Encounter (Signed)
No - the final decision was that we would not start either medicine.

## 2015-04-07 NOTE — Telephone Encounter (Signed)
Sildenafil has been approved from 04/05/15 - 04/04/17 PA Case: [id number]  However, per Hospice, they have decided not to give this medication to patient.  Nothing further needed.

## 2015-04-07 NOTE — Telephone Encounter (Signed)
Patient is with [facility] per [facility]. Nothing further needed.

## 2015-04-08 LAB — LEGIONELLA ANTIGEN, URINE

## 2015-04-08 NOTE — Patient Outreach (Signed)
[facility]) Care Management  04/03/2015  [name] Dec 25, 1934 [id number]   Notification from Danford Bad, LCSW to close case due to patient enrolled in Hospice care.  Thanks, Corrie Mckusick. Sharlee Blew [facility] Care Management [facility] CM Assistant Phone: [phone] Fax: [phone]

## 2015-04-14 ENCOUNTER — Ambulatory Visit: Payer: Commercial Managed Care - HMO | Admitting: Emergency Medicine

## 2015-04-19 NOTE — Progress Notes (Signed)
Ok per MD for patient to d/c home today with home hospice provided by [facility].  Requires home 02.  EMS transport arranged for return home with [facility]. No further CSW needs identified.  CSW signing off.  Lorri Frederick. Jaci Lazier, [place] [phone]

## 2015-04-24 DEATH — deceased

## 2015-05-07 ENCOUNTER — Ambulatory Visit: Payer: Self-pay | Admitting: Internal Medicine

## 2015-05-07 DIAGNOSIS — Z5181 Encounter for therapeutic drug level monitoring: Secondary | ICD-10-CM

## 2015-05-07 DIAGNOSIS — D735 Infarction of spleen: Secondary | ICD-10-CM

## 2015-05-07 DIAGNOSIS — I480 Paroxysmal atrial fibrillation: Secondary | ICD-10-CM

## 2015-05-24 DEATH — deceased

## 2015-08-12 IMAGING — DX DG CHEST 2V
2 series · 2 of 2 positions shown · non-contrast
Comparison: PA and lateral chest x-ray July 12, 2011 and
chest CT scan of November 13, 2013.

CLINICAL DATA: Shortness of breath since yesterday, history of
chronic thromboembolic disease, asthma.

EXAM:
CHEST  2 VIEW

[chest pa]
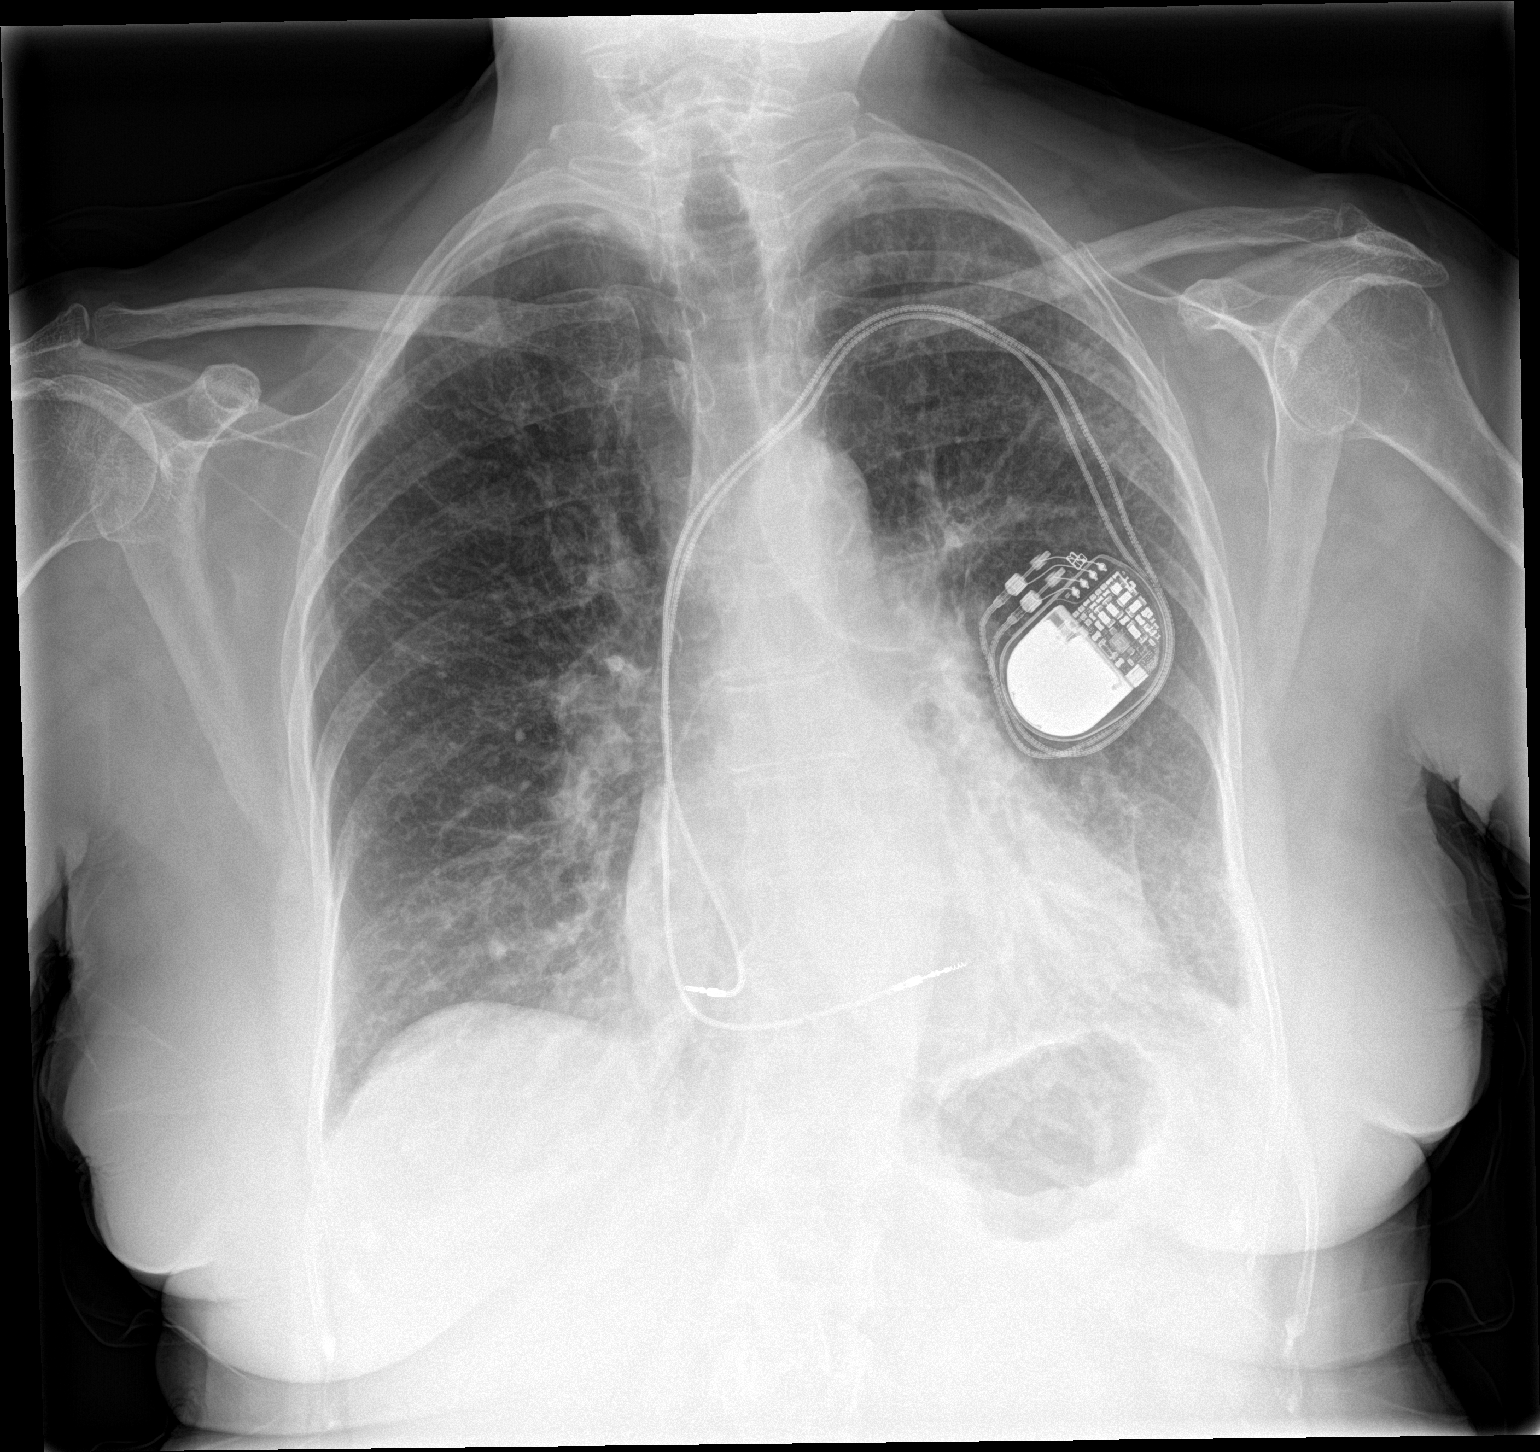

[chest lat]
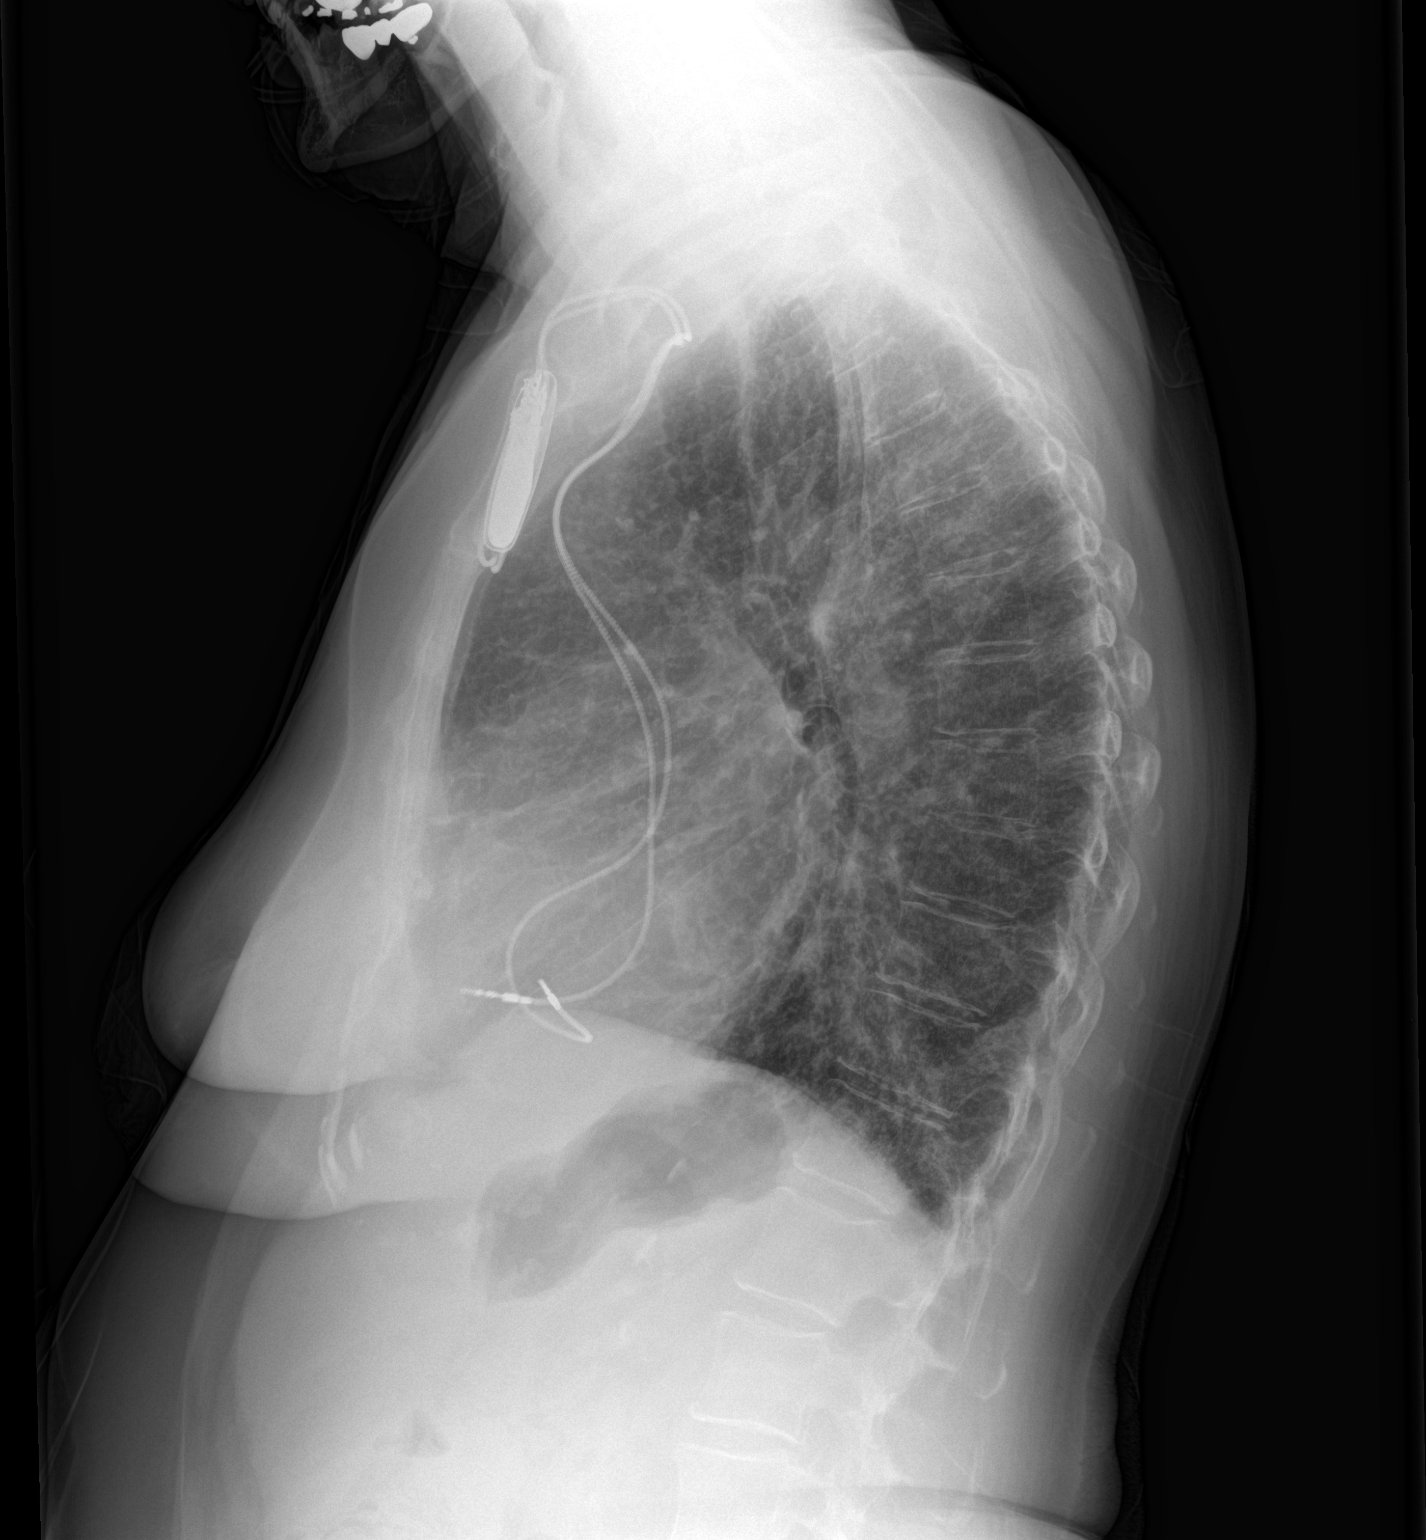

[2 of 2 positions shown; findings below may reference images not displayed]

FINDINGS: The lungs are adequately inflated. The interstitial markings are
coarse bilaterally especially in the left retrocardiac region. There
is no alveolar infiltrate or pleural effusion. The cardiac
silhouette is mildly enlarged but stable. The central pulmonary
vascularity is prominent. The permanent pacemaker is in appropriate
position radiographically. The bony thorax exhibits no acute
abnormality.
IMPRESSION: 1. COPD and pulmonary fibrotic change. There is no alveolar
pneumonia. Minimal basilar atelectasis on the left may be present
but the findings could be chronic as well.
2. Stable mild enlargement of the cardiac silhouette without
pulmonary edema.

## 2015-10-20 IMAGING — CR DG CHEST 1V PORT
1 series · 1 of 1 positions shown · non-contrast
Comparison: 03/25/2015.

CLINICAL DATA: Pulmonary edema.

EXAM:
PORTABLE CHEST - 1 VIEW

[AP]
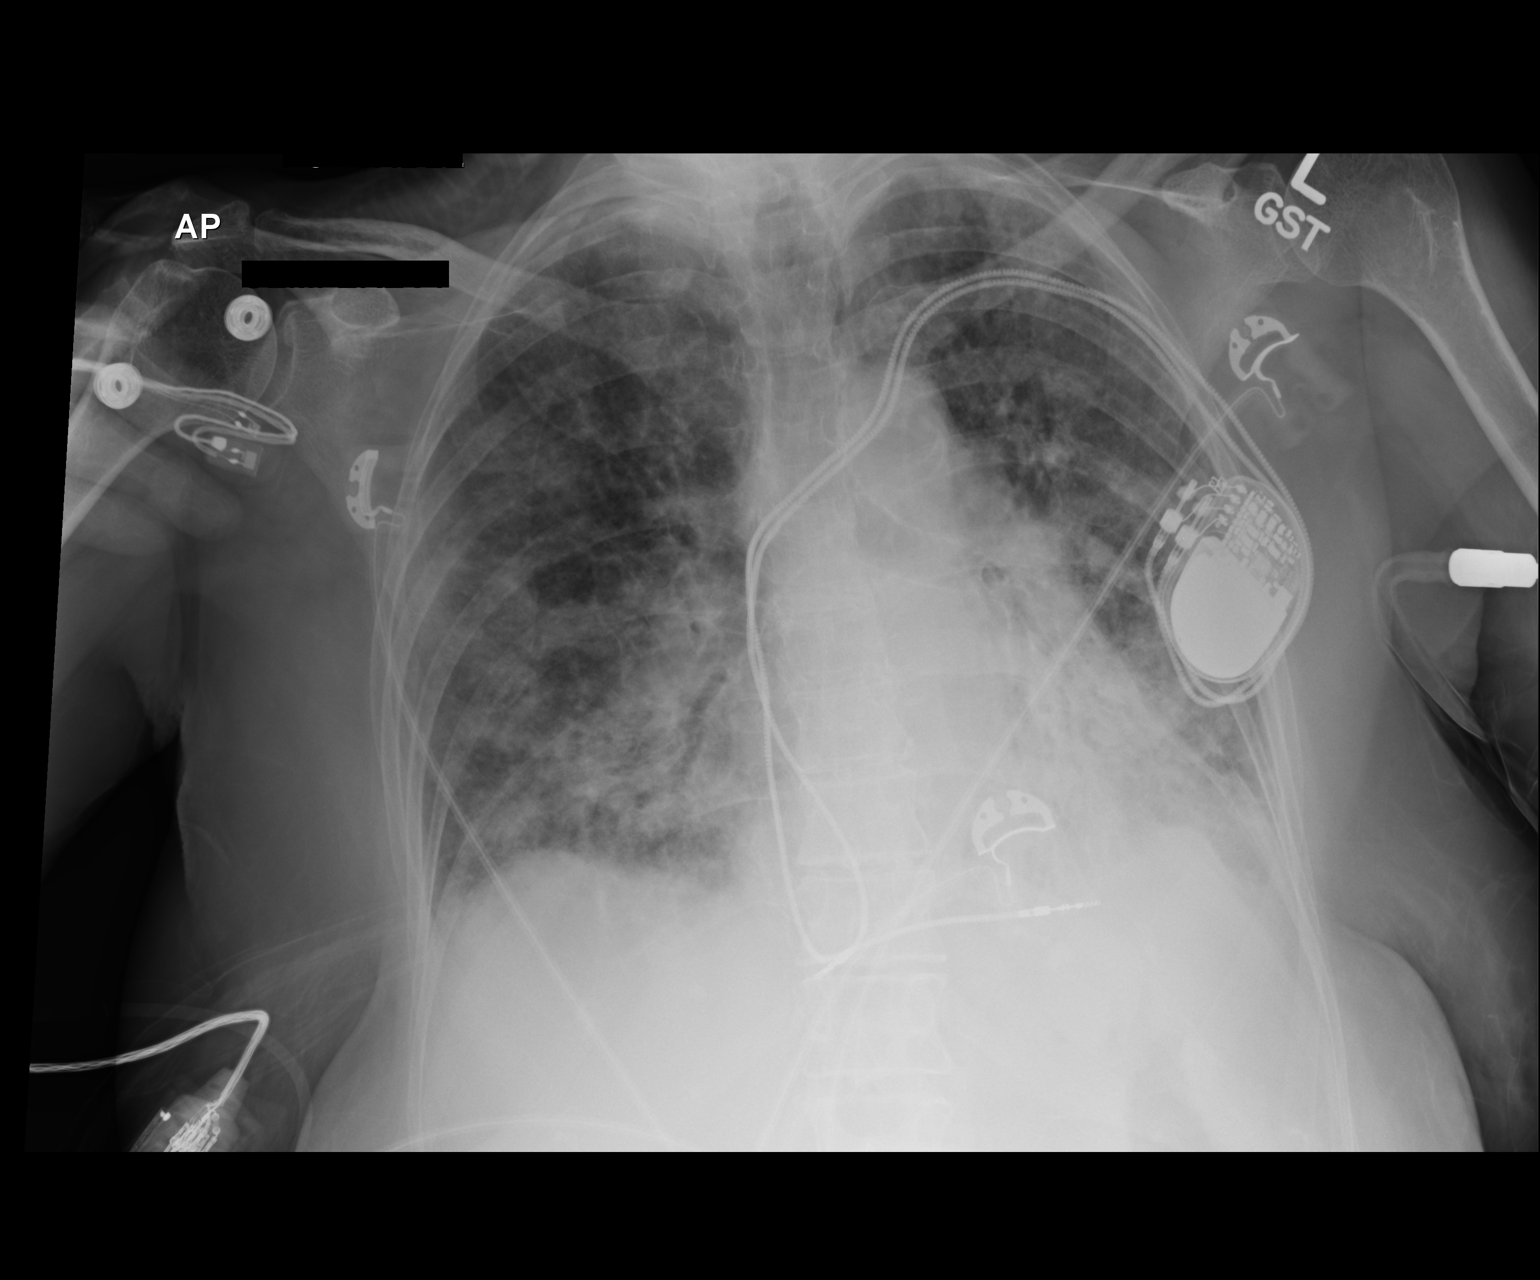

[1 of 1 positions shown; findings below may reference images not displayed]

FINDINGS: Mediastinum hilar structures normal. Cardiac pacer noted in stable
position. Persistent bilateral unchanged airspace disease consistent
with bilateral pulmonary edema and/or pneumonia. Small left pleural
effusion. No pneumothorax.
IMPRESSION: 1. Persistent bilateral diffuse airspace disease consistent with
pulmonary edema and/or pneumonia. Small left pleural effusion.
2. Stable cardiac pacer position.Heart size stable.

## 2015-10-25 IMAGING — CR DG CHEST 1V PORT
1 series · 1 of 1 positions shown · non-contrast
Comparison: 03/30/2015 and earlier, including high-resolution chest
CT 02/24/2015.

CLINICAL DATA: Followup pulmonary edema superimposed upon chronic
lung disease.

EXAM:
PORTABLE CHEST - 1 VIEW

[AP]
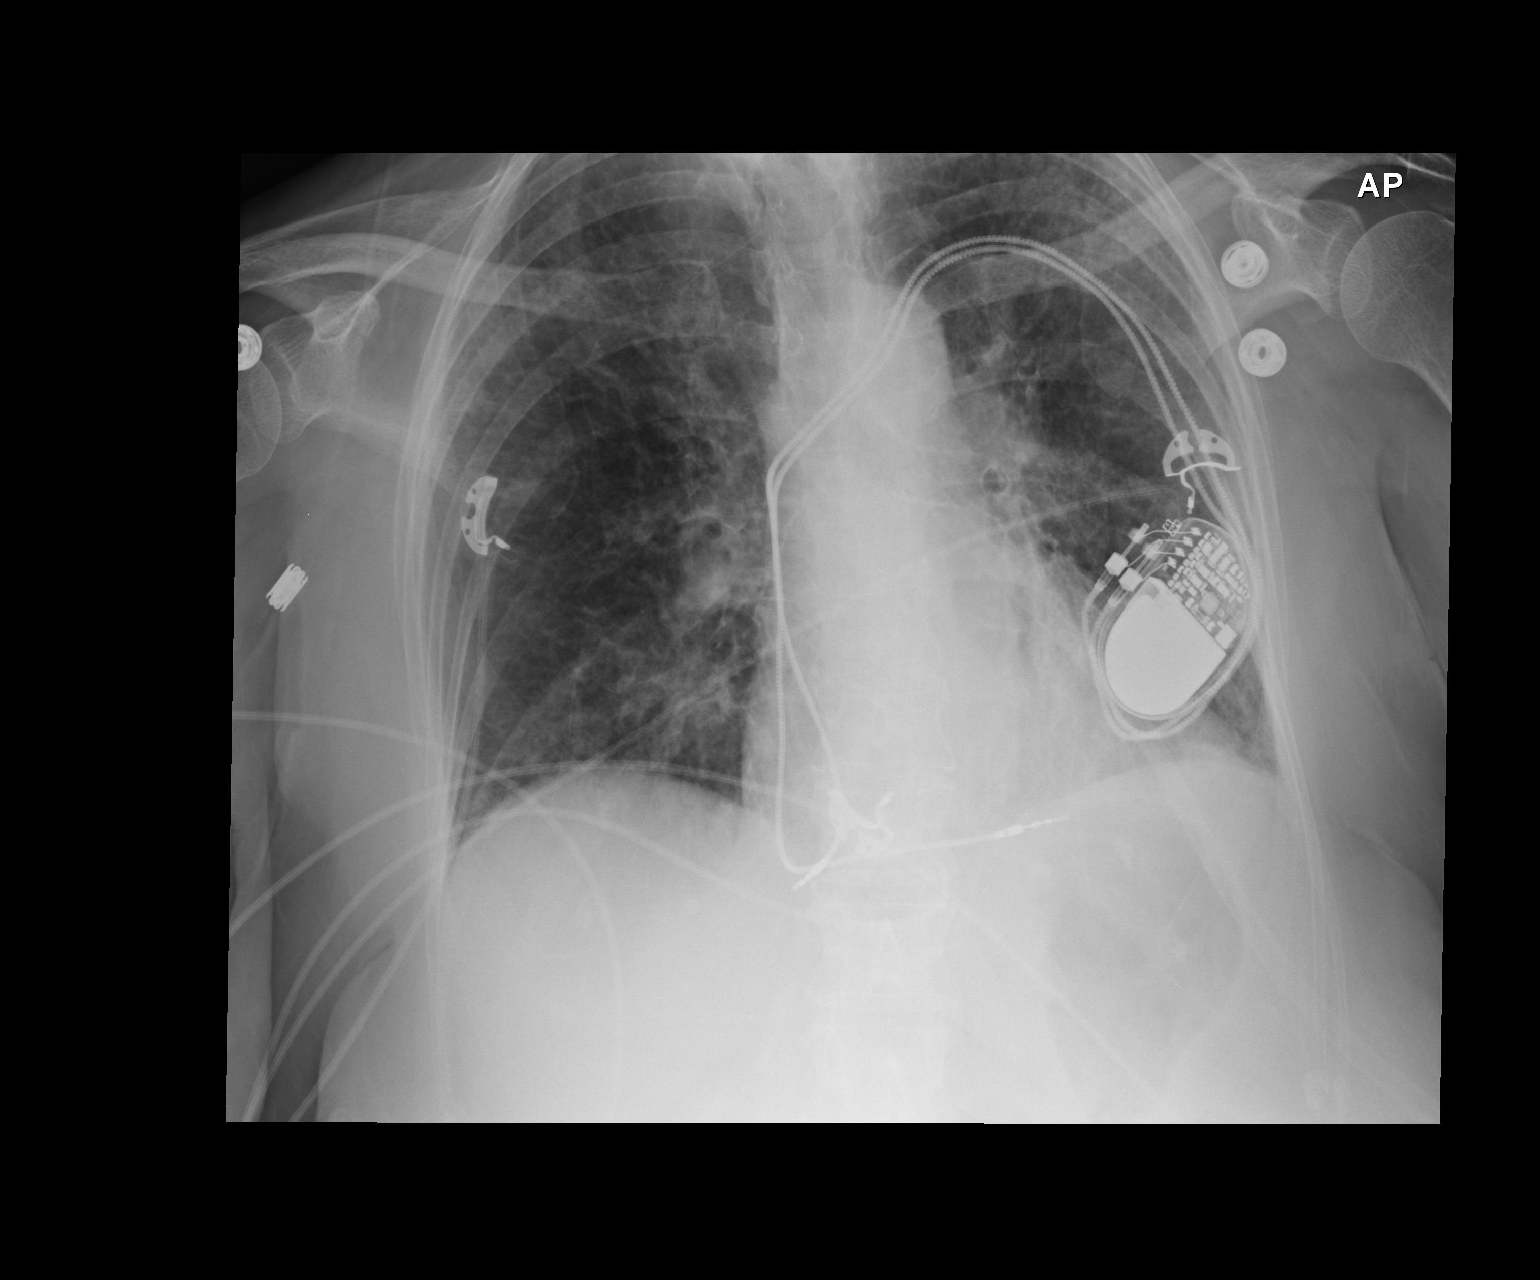

[1 of 1 positions shown; findings below may reference images not displayed]

FINDINGS: Cardiac silhouette mildly enlarged, unchanged. Left subclavian dual
lead transvenous pacemaker unchanged and intact. Further clearing of
the patchy airspace opacities in the lung bases since the
examination 2 days ago. Chronic opacity in the left apex, unchanged
since the prior CT. No new pulmonary parenchymal abnormalities.
IMPRESSION: Essential resolution of interstitial and airspace pulmonary edema.
Stable baseline chronic lung changes. No new/acute cardiopulmonary
disease.
